# Patient Record
Sex: Female | Born: 1995 | Race: Black or African American | Hispanic: No | Marital: Single | Smoking: Never smoker
Health system: Southern US, Community
[De-identification: ages and names within clinical notes are randomized; demographics above are authoritative.]

## PROBLEM LIST (undated history)

## (undated) DIAGNOSIS — R519 Headache, unspecified: Secondary | ICD-10-CM

## (undated) DIAGNOSIS — K219 Gastro-esophageal reflux disease without esophagitis: Secondary | ICD-10-CM

## (undated) DIAGNOSIS — F419 Anxiety disorder, unspecified: Secondary | ICD-10-CM

## (undated) DIAGNOSIS — R51 Headache: Secondary | ICD-10-CM

## (undated) DIAGNOSIS — N809 Endometriosis, unspecified: Secondary | ICD-10-CM

## (undated) DIAGNOSIS — N83209 Unspecified ovarian cyst, unspecified side: Secondary | ICD-10-CM

## (undated) HISTORY — DX: Unspecified ovarian cyst, unspecified side: N83.209

## (undated) HISTORY — DX: Endometriosis, unspecified: N80.9

## (undated) HISTORY — PX: NO PAST SURGERIES: SHX2092

---

## 2015-03-28 ENCOUNTER — Encounter: Payer: Self-pay | Admitting: Obstetrics and Gynecology

## 2015-04-04 ENCOUNTER — Encounter: Payer: Self-pay | Admitting: Obstetrics and Gynecology

## 2015-05-03 ENCOUNTER — Encounter: Payer: Self-pay | Admitting: Physician Assistant

## 2015-05-03 ENCOUNTER — Ambulatory Visit (INDEPENDENT_AMBULATORY_CARE_PROVIDER_SITE_OTHER): Payer: BC Managed Care – PPO | Admitting: Physician Assistant

## 2015-05-03 VITALS — BP 100/72 | HR 72 | Temp 98.3°F | Resp 12 | Ht 61.5 in | Wt 117.0 lb

## 2015-05-03 DIAGNOSIS — N898 Other specified noninflammatory disorders of vagina: Secondary | ICD-10-CM | POA: Diagnosis not present

## 2015-05-03 DIAGNOSIS — M546 Pain in thoracic spine: Secondary | ICD-10-CM

## 2015-05-03 DIAGNOSIS — Z7251 High risk heterosexual behavior: Secondary | ICD-10-CM

## 2015-05-03 DIAGNOSIS — B379 Candidiasis, unspecified: Secondary | ICD-10-CM | POA: Diagnosis not present

## 2015-05-03 DIAGNOSIS — Z113 Encounter for screening for infections with a predominantly sexual mode of transmission: Secondary | ICD-10-CM

## 2015-05-03 DIAGNOSIS — Z0000 Encounter for general adult medical examination without abnormal findings: Secondary | ICD-10-CM | POA: Diagnosis not present

## 2015-05-03 DIAGNOSIS — R238 Other skin changes: Secondary | ICD-10-CM

## 2015-05-03 DIAGNOSIS — Z3009 Encounter for other general counseling and advice on contraception: Secondary | ICD-10-CM

## 2015-05-03 DIAGNOSIS — Z309 Encounter for contraceptive management, unspecified: Secondary | ICD-10-CM

## 2015-05-03 DIAGNOSIS — R233 Spontaneous ecchymoses: Secondary | ICD-10-CM

## 2015-05-03 LAB — POCT WET PREP (WET MOUNT)

## 2015-05-03 LAB — POCT URINE PREGNANCY: Preg Test, Ur: NEGATIVE

## 2015-05-03 MED ORDER — NORETHINDRONE ACET-ETHINYL EST 1-20 MG-MCG PO TABS: 1.0000 | ORAL_TABLET | Freq: Every day | ORAL | Status: DC

## 2015-05-03 MED ORDER — FLUCONAZOLE 150 MG PO TABS: 150.0000 mg | ORAL_TABLET | Freq: Once | ORAL | Status: DC

## 2015-05-03 NOTE — Progress Notes (Signed)
Patient ID: Kelly Lane, female   DOB: 26-Mar-1996, 19 y.o.   MRN: [id number]       Patient: Kelly Lane Female    DOB: Jan 16, 1996   19 y.o.   MRN: [id number] Visit Date: 05/03/2015  Today's Provider: Mar Daring, PA-C   Chief Complaint  Patient presents with  . Establish Care  . Vaginal Discharge   Subjective:    HPI  Patient is here to get established. She use to have PCP in [place]. Patient is having issues with vaginal discharge and vaginal pain, for a while she states. This happens right before she starts her menstrual cycle and just after. Discharge is described as brownish color at times as well as clear also. She has not been sexually active until just recently, December 9th. She did use condoms at that time. Her last menstrual cycle ended 4 days ago on December 25th roughly. She has never had a pap smear because her other PCP told her it was not necessary since she was not sexually active until she is 18 years old.  She is also interested in possibly seeing a specialist to be considered for breast reduction. She does state that she has chronic back pain in her mid back and sometimes low back. She feels that this is most likely due to her being large chested and having a very small frame.    Not on File Previous Medications   MULTIPLE VITAMIN (MULTIVITAMIN) CAPSULE    Take 1 capsule by mouth daily.    Review of Systems  Constitutional: Negative.   HENT: Positive for sneezing and sore throat.   Eyes: Negative.   Respiratory: Negative.   Cardiovascular: Negative.   Gastrointestinal: Negative.   Endocrine: Negative.   Genitourinary: Positive for vaginal discharge and vaginal pain.  Musculoskeletal: Negative.   Skin: Negative.   Allergic/Immunologic: Negative.   Neurological: Negative.   Hematological: Negative.   Psychiatric/Behavioral: Negative.     Social History  Substance Use Topics  . Smoking status: Never Smoker   . Smokeless tobacco: Never Used  .  Alcohol Use: No   Objective:   BP 100/72 mmHg  Pulse 72  Temp(Src) 98.3 F (36.8 C)  Resp 12  Ht 5' 1.5" (1.562 m)  Wt 117 lb (53.071 kg)  BMI 21.75 kg/m2  LMP 04/28/2015  Physical Exam  Constitutional: She is oriented to person, place, and time. She appears well-developed and well-nourished. No distress.  HENT:  Head: Normocephalic and atraumatic.  Right Ear: Hearing, tympanic membrane, external ear and ear canal normal.  Left Ear: Hearing, tympanic membrane, external ear and ear canal normal.  Nose: Nose normal.  Mouth/Throat: Uvula is midline, oropharynx is clear and moist and mucous membranes are normal. No oropharyngeal exudate.  Eyes: Conjunctivae and EOM are normal. Pupils are equal, round, and reactive to light. Right eye exhibits no discharge. Left eye exhibits no discharge. No scleral icterus.  Neck: Normal range of motion. Neck supple. No JVD present. Carotid bruit is not present. No tracheal deviation present. No thyromegaly present.  Cardiovascular: Normal rate, regular rhythm, normal heart sounds and intact distal pulses.  Exam reveals no gallop and no friction rub.   No murmur heard. Pulmonary/Chest: Effort normal and breath sounds normal. No respiratory distress. She has no wheezes. She has no rales. She exhibits no tenderness.  Abdominal: Soft. Bowel sounds are normal. She exhibits no distension and no mass. There is no tenderness. There is no rebound and no guarding. Hernia confirmed negative in  the right inguinal area and confirmed negative in the left inguinal area.  Genitourinary: Rectum normal and uterus normal. No breast swelling, tenderness, discharge or bleeding. Pelvic exam was performed with patient supine. There is no rash, tenderness, lesion or injury on the right labia. There is no rash, tenderness, lesion or injury on the left labia. Cervix exhibits no motion tenderness, no discharge and no friability. Right adnexum displays no mass, no tenderness and no  fullness. Left adnexum displays no mass, no tenderness and no fullness. No erythema, tenderness or bleeding in the vagina. No signs of injury around the vagina. Vaginal discharge (whitish-brown) found.  Musculoskeletal: Normal range of motion. She exhibits no edema or tenderness.  Lymphadenopathy:    She has no cervical adenopathy.       Right: No inguinal adenopathy present.       Left: No inguinal adenopathy present.  Neurological: She is alert and oriented to person, place, and time. She has normal reflexes. No cranial nerve deficit. Coordination normal.  Skin: Skin is warm and dry. No rash noted. She is not diaphoretic.  Psychiatric: She has a normal mood and affect. Her behavior is normal. Judgment and thought content normal.  Vitals reviewed.       Assessment & Plan:     1. Annual physical exam Exam was within normal limits with the exception of the vaginal discharge. Will treat as below. She is to call the office if she has any acute issues, questions or concerns. If she does not I will see her back in one year for her repeat annual physical exam. At that time she will be due for her Pap smear.  2. Encounter for general counseling on prescription of oral contraceptives We discussed her menstrual cycle and the cramping that is most likely PMS. I did discuss with her options for contraception including oral contraceptives, intrauterine devices, implantable, and injectables. I discussed side effects and benefits for each. I do feel that she would most benefit from oral contraception to better regulate her menstrual cycle as well as hopefully reduce menstrual cramping. It will also be beneficial since she is now sexually active. We did discuss that oral contraceptives do not prevent the spread of sexual transmitted diseases and that she should still continue to use other methods such as condoms to prevent transmission. We will start with Loestrin as below. She is to call the office if she has  any breakthrough bleeding on this medication and we will increase the estrogen dose. If she does not we will continue this and I'll refill as necessary. She is to call the office if she has any questions or concerns. If not I will see her back in one year for her repeat annual physical exam. - norethindrone-ethinyl estradiol (MICROGESTIN,JUNEL,LOESTRIN) 1-20 MG-MCG tablet; Take 1 tablet by mouth daily.  Dispense: 1 Package; Refill: 3  3. Encounter for screening examination for sexually transmitted disease Due to her recent sexual encounter she would like to be tested for STDs. - POCT Wet Prep ([place]) - GC/Chlamydia Probe Amp  4. Vaginal discharge Wet prep was positive for yeast. I will treat with Diflucan as below. She is to call the office if she develops similar symptoms or if symptoms do not improve or worsen. - POCT Wet Prep [employer])  5. Bilateral thoracic back pain She feels her back pain is most likely secondary to being large chested. She would like to be considered for breast reduction. I will refer her to plastic  surgery for evaluation and consultation on this. - Ambulatory referral to Plastic Surgery  6. Bruises easily She states that her hands and arms bruise easily and she feels that her feet are always cold. I will do a complete blood count to check for anemia. I will follow-up with her pending these results. If labs are stable I will see her back in one year for her repeat annual physical exam. - CBC with Differential  7. Unprotected sexual intercourse She states that they did use a condom but she still feels that she would like to be tested to make sure she is not pregnant. She was not sure if the condom state on the whole time nor is she sure if the condom was secure. Urine pregnancy in the office today was negative. - POCT urine pregnancy  8. Yeast infection Wet prep for vaginal discharge did reveal yeast. I will treat with Diflucan as below. She is to call the office  if symptoms fail to improve or persist. - fluconazole (DIFLUCAN) 150 MG tablet; Take 1 tablet (150 mg total) by mouth once. May take 2nd pill if needed in 24-48 hours following the 1st pill  Dispense: 2 tablet; Refill: 0       Mar Daring, PA-C  [facility]

## 2015-05-04 LAB — CBC WITH DIFFERENTIAL/PLATELET
Basophils Absolute: 0 10*3/uL (ref 0.0–0.2)
Basos: 1 %
EOS (ABSOLUTE): 0 10*3/uL (ref 0.0–0.4)
Eos: 1 %
Hematocrit: 38.5 % (ref 34.0–46.6)
Hemoglobin: 13.4 g/dL (ref 11.1–15.9)
Immature Grans (Abs): 0 10*3/uL (ref 0.0–0.1)
Immature Granulocytes: 0 %
Lymphocytes Absolute: 1.4 10*3/uL (ref 0.7–3.1)
Lymphs: 34 %
MCH: 33.1 pg — ABNORMAL HIGH (ref 26.6–33.0)
MCHC: 34.8 g/dL (ref 31.5–35.7)
MCV: 95 fL (ref 79–97)
Monocytes Absolute: 0.4 10*3/uL (ref 0.1–0.9)
Monocytes: 9 %
Neutrophils Absolute: 2.3 10*3/uL (ref 1.4–7.0)
Neutrophils: 55 %
Platelets: 257 10*3/uL (ref 150–379)
RBC: 4.05 x10E6/uL (ref 3.77–5.28)
RDW: 12.7 % (ref 12.3–15.4)
WBC: 4.2 10*3/uL (ref 3.4–10.8)

## 2015-05-05 LAB — GC/CHLAMYDIA PROBE AMP
Chlamydia trachomatis, NAA: NEGATIVE
Neisseria gonorrhoeae by PCR: NEGATIVE

## 2015-05-05 LAB — PLEASE NOTE

## 2015-05-08 ENCOUNTER — Telehealth: Payer: Self-pay

## 2015-05-08 NOTE — Telephone Encounter (Signed)
LMTCB  Thanks,  -Haddon Fyfe 

## 2015-05-08 NOTE — Telephone Encounter (Signed)
-----   Message from Mar Daring, PA-C sent at 05/08/2015  9:20 AM EST ----- STD testing was negative.

## 2015-05-08 NOTE — Telephone Encounter (Addendum)
Pt returning call.  [id number]

## 2015-05-08 NOTE — Telephone Encounter (Signed)
Patient advised as directed below. Patient wants to ask Tawanna Sat if is ok gor her to be spotting early with the contraceptive that she is currently taking. Per patient doesn't want the dose to be increase. Just wants to know if is ok for that to happen.( To be spotting early?)  Please Advise,  Thanks,  -Kelly Lane

## 2015-05-08 NOTE — Telephone Encounter (Signed)
LMTCB  Thanks,  -Mir Fullilove 

## 2015-05-08 NOTE — Telephone Encounter (Signed)
Yes that is fine at this time. It may take her body 2-3 months to adjust to the current dose. After 2-3 months if she is still having occasional spotting it may be necessary to increase the estrogen dose at that time.

## 2015-05-09 NOTE — Telephone Encounter (Signed)
Pt called back, has questions regarding her birth control.  Please call her back,  Thnaks,teri

## 2015-05-10 NOTE — Telephone Encounter (Signed)
LMTCB  Thanks,  -Scottlynn Lindell 

## 2015-05-10 NOTE — Telephone Encounter (Signed)
Pt returned Kelly Lane's call. Thanks TNP °

## 2015-05-11 NOTE — Telephone Encounter (Signed)
LMTCB  Thanks,  -Joseline 

## 2015-05-15 ENCOUNTER — Telehealth: Payer: Self-pay

## 2015-05-15 DIAGNOSIS — Z3042 Encounter for surveillance of injectable contraceptive: Secondary | ICD-10-CM

## 2015-05-15 NOTE — Telephone Encounter (Signed)
Patient called that doesn't want to take the oral contraception anymore doesn't like the bleeding issue. She said she has until the 8 th of this month to finished the package. Patient wants to stopped the oral contraception then and wants to try the injection. How long does she needs to wait after finishing the oral contraception before starting the injection? Patient was also asking that after stopping the oral contraception when will the bloody discharge be gone?  Please Advise,  Thanks,  -Grissel Tyrell

## 2015-05-15 NOTE — Telephone Encounter (Signed)
Patient advised as directed below on the previous messages.  Thanks,  -Joseline

## 2015-05-16 ENCOUNTER — Other Ambulatory Visit: Payer: Self-pay | Admitting: Physician Assistant

## 2015-05-16 ENCOUNTER — Telehealth: Payer: Self-pay | Admitting: Physician Assistant

## 2015-05-16 MED ORDER — MEDROXYPROGESTERONE ACETATE 150 MG/ML IM SUSP: 150.0000 mg | Freq: Once | INTRAMUSCULAR | Status: DC

## 2015-05-16 NOTE — Telephone Encounter (Signed)
She can switch immediately from one to the other.  This reduces risk of pregnancy. No gaps in between.  She may still notice some spotting once starting depo as well.  This normally only last for the first few weeks to one month. She may schedule an appt for depo provera at any time before she finishes her pack. If she would like I can send in Rx for depo and she can bring with her or we can give her one from here if we have it.

## 2015-05-16 NOTE — Telephone Encounter (Signed)
Patient is aware 

## 2015-05-16 NOTE — Telephone Encounter (Signed)
Please notify patient Rx was sent to [facility] [address] and to make sure she brings this when she comes on 05/23/15. Thanks.

## 2015-05-16 NOTE — Telephone Encounter (Signed)
Spoke with patient over the phone. She does not wish to use any birth control at this time. She may discontinue her oral contraceptive either now or when she would be going to the sugar pill portion of the pack so that her menstrual cycle will be more regular. She is to call the office if she changes her mind and would like to go back on the oral contraceptives at any time.

## 2015-05-16 NOTE — Telephone Encounter (Signed)
Patient advised as directed below. Patient scheduled for depo shot on 05/23/2015. Pharmacy- [place].

## 2015-05-16 NOTE — Telephone Encounter (Signed)
Pt called saying she wants to go off her birth control pilss.  She has an appt next Wednesday to get the depo shot but sh does not want to .  She does not want to take anything.  She wants to talk to you about stopping her pills.  Her call back is [phone]  Thanks Con Memos

## 2015-05-23 ENCOUNTER — Ambulatory Visit: Payer: Self-pay | Admitting: Physician Assistant

## 2015-05-31 ENCOUNTER — Encounter: Payer: Self-pay | Admitting: Obstetrics and Gynecology

## 2015-06-11 ENCOUNTER — Telehealth: Payer: Self-pay

## 2015-06-11 NOTE — Telephone Encounter (Signed)
We received a call center report on patient saying that she is having pelvic pains and she is 2 weeks late on her period. L/M for patient to give [place] a call back to schedule an appt.

## 2015-06-12 ENCOUNTER — Encounter: Payer: Self-pay | Admitting: Physician Assistant

## 2015-06-12 ENCOUNTER — Ambulatory Visit
Admission: RE | Admit: 2015-06-12 | Discharge: 2015-06-12 | Disposition: A | Discharge status: Home / Self Care | Payer: BC Managed Care – PPO | Source: Ambulatory Visit | Attending: Physician Assistant | Admitting: Physician Assistant

## 2015-06-12 ENCOUNTER — Ambulatory Visit (INDEPENDENT_AMBULATORY_CARE_PROVIDER_SITE_OTHER): Payer: BC Managed Care – PPO | Admitting: Physician Assistant

## 2015-06-12 ENCOUNTER — Telehealth: Payer: Self-pay

## 2015-06-12 VITALS — BP 120/70 | HR 70 | Temp 98.4°F | Resp 16 | Wt 116.8 lb

## 2015-06-12 DIAGNOSIS — R1031 Right lower quadrant pain: Secondary | ICD-10-CM | POA: Insufficient documentation

## 2015-06-12 DIAGNOSIS — Z8489 Family history of other specified conditions: Secondary | ICD-10-CM

## 2015-06-12 DIAGNOSIS — Z87898 Personal history of other specified conditions: Secondary | ICD-10-CM

## 2015-06-12 DIAGNOSIS — Z842 Family history of other diseases of the genitourinary system: Secondary | ICD-10-CM

## 2015-06-12 DIAGNOSIS — N926 Irregular menstruation, unspecified: Secondary | ICD-10-CM

## 2015-06-12 DIAGNOSIS — K5900 Constipation, unspecified: Secondary | ICD-10-CM | POA: Diagnosis not present

## 2015-06-12 LAB — POCT URINE PREGNANCY: Preg Test, Ur: NEGATIVE

## 2015-06-12 NOTE — Telephone Encounter (Signed)
-----   Message from Mar Daring, [place] sent at 06/12/2015  1:54 PM EST ----- Moderate stool is noted in the colon including a moderate amount located in the pelvic region.  I do feel this is what is causing your abdominal pain, but still recommend following through with the pelvic US as well.  They should be contacting you about that appointment soon. In the meantime I do recommend adding miralax (available OTC) for constipation. Start with one capful daily.  If still no BM increase to 1 capful twice daily. It is ok to continue miralax daily. I recommend use to keep stools soft and semi-loose but not watery.

## 2015-06-12 NOTE — Telephone Encounter (Signed)
LMTCB  Thanks,  -Joseline 

## 2015-06-12 NOTE — Progress Notes (Signed)
Patient: Kelly Lane Female    DOB: [birth year]   20 y.o.   MRN: [id number] Visit Date: 06/12/2015  Today's Provider: Mar Daring, PA-C   Chief Complaint  Patient presents with  . Menstrual Problem   Subjective:    HPI Kelly Lane is here concern about pelvic pain and is 2.5 weeks late on her menstrual cycle. On 05/16/15 patient called that didn't want to be on any birth control medication at that time. She was advised to finish what she had left or to just to discontinue her oral contraceptive. She has stomach discomfort, cramps on the right side of lower abdomen, nauseas and a little vaginal discharge,clear to white. Stools are irregular and hard. She also states they are small in size. She denies any blood in the stool or rectal bleeding with bowel movements. She is currently sexually active. She does state that she took a pregnancy test at home which was negative as well.     No Known Allergies Previous Medications   FLUCONAZOLE (DIFLUCAN) 150 MG TABLET    Take 1 tablet (150 mg total) by mouth once. May take 2nd pill if needed in 24-48 hours following the 1st pill   MULTIPLE VITAMIN (MULTIVITAMIN) CAPSULE    Take 1 capsule by mouth daily.    Review of Systems  Constitutional: Negative for fever, chills and fatigue.  HENT: Negative.   Respiratory: Negative for cough, chest tightness, shortness of breath and wheezing.   Cardiovascular: Negative for chest pain.  Gastrointestinal: Positive for nausea, abdominal pain and abdominal distention. Negative for vomiting, diarrhea, constipation, blood in stool and anal bleeding.  Genitourinary: Positive for vaginal discharge and pelvic pain. Negative for dysuria, urgency, frequency, hematuria, flank pain, vaginal bleeding, genital sores, vaginal pain and menstrual problem.  Musculoskeletal: Negative for back pain.    Social History  Substance Use Topics  . Smoking status: Never Smoker   . Smokeless tobacco: Never Used  .  Alcohol Use: No   Objective:   BP 120/70 mmHg  Pulse 70  Temp(Src) 98.4 F (36.9 C) (Oral)  Resp 16  Wt 116 lb 12.8 oz (52.98 kg)  LMP 04/22/2015  Physical Exam  Constitutional: She is oriented to person, place, and time. She appears well-developed and well-nourished. No distress.  Cardiovascular: Normal rate, regular rhythm and normal heart sounds.  Exam reveals no gallop and no friction rub.   No murmur heard. Pulmonary/Chest: Effort normal and breath sounds normal. No respiratory distress. She has no wheezes. She has no rales.  Abdominal: Soft. Normal appearance and bowel sounds are normal. She exhibits no shifting dullness, no distension, no ascites, no pulsatile midline mass and no mass. There is no hepatosplenomegaly. There is no tenderness. There is no rebound, no guarding and no CVA tenderness.  Neurological: She is alert and oriented to person, place, and time.  Skin: Skin is warm and dry. She is not diaphoretic.        Assessment & Plan:     1. Missed menses Urine pregnancy today in the office was negative. She does have positive family history of uterine fibroids in her mother. We will get a pelvic ultrasound and transvaginal ultrasound to further evaluate for this as well as possible ovarian cyst to see if this may be a possible cause for her missed menstruation as well as right lower quadrant pain. I will follow-up with her pending the results of these tests. - POCT urine pregnancy - US Pelvis Complete;  Future - US Transvaginal Non-OB; Future  2. RLQ abdominal pain I will obtain an abdominal x-ray to evaluate for constipation. I do feel this may be more likely the cause of her abdominal pain. I will follow-up with her pending these results. - DG Abd 2 Views; Future - US Pelvis Complete; Future - US Transvaginal Non-OB; Future  3. Family history of uterine fibroid See above medical treatment plan for #1. - US Pelvis Complete; Future - US Transvaginal Non-OB;  Future       Mar Daring, PA-C  [facility]

## 2015-06-12 NOTE — Telephone Encounter (Signed)
Closing Encounter. Patient has an appointment today to see Fenton Malling.  Thanks.  Kasandra Knudsen

## 2015-06-13 NOTE — Telephone Encounter (Signed)
Patient advised as directed below.  Thanks,  -Joseline 

## 2015-06-13 NOTE — Patient Instructions (Signed)

## 2015-06-15 ENCOUNTER — Telehealth: Payer: Self-pay

## 2015-06-15 ENCOUNTER — Other Ambulatory Visit: Payer: Self-pay | Admitting: Physician Assistant

## 2015-06-15 ENCOUNTER — Ambulatory Visit
Admission: RE | Admit: 2015-06-15 | Discharge: 2015-06-15 | Disposition: A | Discharge status: Home / Self Care | Payer: BC Managed Care – PPO | Source: Ambulatory Visit | Attending: Physician Assistant | Admitting: Physician Assistant

## 2015-06-15 DIAGNOSIS — R1031 Right lower quadrant pain: Secondary | ICD-10-CM | POA: Diagnosis present

## 2015-06-15 DIAGNOSIS — R938 Abnormal findings on diagnostic imaging of other specified body structures: Secondary | ICD-10-CM | POA: Insufficient documentation

## 2015-06-15 DIAGNOSIS — R9389 Abnormal findings on diagnostic imaging of other specified body structures: Secondary | ICD-10-CM

## 2015-06-15 DIAGNOSIS — N926 Irregular menstruation, unspecified: Secondary | ICD-10-CM

## 2015-06-15 DIAGNOSIS — N83291 Other ovarian cyst, right side: Secondary | ICD-10-CM | POA: Diagnosis not present

## 2015-06-15 DIAGNOSIS — N83299 Other ovarian cyst, unspecified side: Secondary | ICD-10-CM

## 2015-06-15 DIAGNOSIS — R102 Pelvic and perineal pain: Secondary | ICD-10-CM

## 2015-06-15 DIAGNOSIS — Z842 Family history of other diseases of the genitourinary system: Secondary | ICD-10-CM | POA: Insufficient documentation

## 2015-06-15 DIAGNOSIS — Z8489 Family history of other specified conditions: Secondary | ICD-10-CM

## 2015-06-15 DIAGNOSIS — Z87898 Personal history of other specified conditions: Secondary | ICD-10-CM

## 2015-06-15 NOTE — Telephone Encounter (Signed)
Olivia Mackie from the [facility] radiology department called with [place] results. Edommetrial thickening 19 mm. Endometrial thickening considered abnormal. Consider follow-up by [place] 6-8 weeks. During the week immediately following menses.  Complex Ovarian cyst 2.8 by 2.9 by 2.0 cm. Short interval Follow-up US in 6-12 weeks recommended. Pregnancy test is also suggested to exclude ectopic pregnancy.  Thanks,  -Joseline

## 2015-06-15 NOTE — Telephone Encounter (Signed)
Called and left VM for patient to call back.  Have ordered referral to GYN for further evaluation and have ordered beta HCG lab that can be done to make sure no ectopic pregnancy. Please inform of results as below if she calls back and I am not available.  I will be happy to answer any and all questions when she does call.  Thanks.

## 2015-06-15 NOTE — Telephone Encounter (Signed)
Asked currently closed note please keep open.

## 2015-06-16 LAB — BETA HCG QUANT (REF LAB): hCG Quant: <1 m[IU]/mL

## 2015-06-16 NOTE — Telephone Encounter (Signed)
Spoke with patient and answered all questions. Patient agrees with current treatment plan.

## 2015-06-18 ENCOUNTER — Telehealth: Payer: Self-pay | Admitting: Physician Assistant

## 2015-06-18 NOTE — Telephone Encounter (Signed)
Perfect

## 2015-06-18 NOTE — Telephone Encounter (Signed)
Pt is requesting a call back from [place].  Pt is wanting to discuss something that happened with her over the weekend.  [id number]

## 2015-06-18 NOTE — Telephone Encounter (Signed)
Spoke with patient and she only wanted Kelly Lane to know that She got her period last night.  Thanks,  -Peony Barner

## 2015-06-21 ENCOUNTER — Telehealth: Payer: Self-pay | Admitting: Physician Assistant

## 2015-06-21 NOTE — Telephone Encounter (Signed)
Pt called back about referral. I looked in the referral notes advised pt that [facility] should contact her to schedule appt and Judson Roch gave me the number to that office. I gave the office # [phone] to pt and advised that if she had any more questions to call us back. Thanks TNP

## 2015-06-21 NOTE — Telephone Encounter (Signed)
Pt called wanting to know who you were going to refer her to for GYN care.  She needs the name and their number.  Pt's call back is [phone]  Thanks Con Memos

## 2015-07-04 DIAGNOSIS — N83209 Unspecified ovarian cyst, unspecified side: Secondary | ICD-10-CM

## 2015-07-04 DIAGNOSIS — N809 Endometriosis, unspecified: Secondary | ICD-10-CM

## 2015-07-04 HISTORY — DX: Unspecified ovarian cyst, unspecified side: N83.209

## 2015-07-04 HISTORY — DX: Endometriosis, unspecified: N80.9

## 2015-07-10 ENCOUNTER — Other Ambulatory Visit: Payer: BC Managed Care – PPO

## 2015-07-10 ENCOUNTER — Encounter: Payer: Self-pay | Admitting: *Deleted

## 2015-07-10 NOTE — Patient Instructions (Signed)
  Your procedure is scheduled on: 07-17-15 (TUESDAY) Report to [place] To find out your arrival time please call [phone] between 1PM - 3PM on 07-16-15 [facility])  Remember: Instructions that are not followed completely may result in serious medical risk, up to and including death, or upon the discretion of your surgeon and anesthesiologist your surgery may need to be rescheduled.    _X___ 1. Do not eat food or drink liquids after midnight. No gum chewing or hard candies.     _X___ 2. No Alcohol for 24 hours before or after surgery.   ____ 3. Bring all medications with you on the day of surgery if instructed.    _X___ 4. Notify your doctor if there is any change in your medical condition     (cold, fever, infections).     Do not wear jewelry, make-up, hairpins, clips or nail polish.  Do not wear lotions, powders, or perfumes. You may wear deodorant.  Do not shave 48 hours prior to surgery. Men may shave face and neck.  Do not bring valuables to the hospital.    [facility] is not responsible for any belongings or valuables.               Contacts, dentures or bridgework may not be worn into surgery.  Leave your suitcase in the car. After surgery it may be brought to your room.  For patients admitted to the hospital, discharge time is determined by your treatment team.   Patients discharged the day of surgery will not be allowed to drive home.   Please read over the following fact sheets that you were given:      ____ Take these medicines the morning of surgery with A SIP OF WATER:    1. NONE  2.   3.   4.  5.  6.  ____ Fleet Enema (as directed)   _X___ Use CHG Soap as directed  ____ Use inhalers on the day of surgery  ____ Stop metformin 2 days prior to surgery    ____ Take 1/2 of usual insulin dose the night before surgery and none on the morning of surgery.   ____ Stop Coumadin/Plavix/aspirin-N/A  ____ Stop Anti-inflammatories-NO  NSAIDS OR ASPIRIN PRODUCTS-TYLENOL OK TO TAKE   ____ Stop supplements until after surgery.    ____ Bring C-Pap to the hospital.

## 2015-07-11 ENCOUNTER — Encounter: Payer: BC Managed Care – PPO | Admitting: Obstetrics and Gynecology

## 2015-07-11 ENCOUNTER — Encounter
Admission: RE | Admit: 2015-07-11 | Discharge: 2015-07-11 | Disposition: A | Discharge status: Home / Self Care | Payer: BC Managed Care – PPO | Source: Ambulatory Visit | Attending: Obstetrics & Gynecology | Admitting: Obstetrics & Gynecology

## 2015-07-11 DIAGNOSIS — Z01812 Encounter for preprocedural laboratory examination: Secondary | ICD-10-CM | POA: Diagnosis present

## 2015-07-11 LAB — CBC
HCT: 39.3 % (ref 35.0–47.0)
Hemoglobin: 13.6 g/dL (ref 12.0–16.0)
MCH: 32.7 pg (ref 26.0–34.0)
MCHC: 34.6 g/dL (ref 32.0–36.0)
MCV: 94.5 fL (ref 80.0–100.0)
Platelets: 177 10*3/uL (ref 150–440)
RBC: 4.16 MIL/uL (ref 3.80–5.20)
RDW: 12.9 % (ref 11.5–14.5)
WBC: 3.7 10*3/uL (ref 3.6–11.0)

## 2015-07-11 LAB — TYPE AND SCREEN
ABO/RH(D): AB NEG
Antibody Screen: NEGATIVE

## 2015-07-12 LAB — ABO/RH: ABO/RH(D): AB NEG

## 2015-07-17 ENCOUNTER — Ambulatory Visit
Admission: RE | Admit: 2015-07-17 | Discharge: 2015-07-17 | Disposition: A | Discharge status: Home / Self Care | Payer: BC Managed Care – PPO | Source: Ambulatory Visit | Attending: Obstetrics & Gynecology | Admitting: Obstetrics & Gynecology

## 2015-07-17 ENCOUNTER — Ambulatory Visit: Payer: BC Managed Care – PPO | Admitting: Anesthesiology

## 2015-07-17 ENCOUNTER — Encounter
Admission: RE | Disposition: A | Discharge status: Home / Self Care | Payer: Self-pay | Source: Ambulatory Visit | Attending: Obstetrics & Gynecology

## 2015-07-17 ENCOUNTER — Encounter: Payer: Self-pay | Admitting: *Deleted

## 2015-07-17 DIAGNOSIS — N83201 Unspecified ovarian cyst, right side: Secondary | ICD-10-CM | POA: Diagnosis not present

## 2015-07-17 DIAGNOSIS — N809 Endometriosis, unspecified: Secondary | ICD-10-CM | POA: Diagnosis not present

## 2015-07-17 DIAGNOSIS — N926 Irregular menstruation, unspecified: Secondary | ICD-10-CM | POA: Insufficient documentation

## 2015-07-17 DIAGNOSIS — R1031 Right lower quadrant pain: Secondary | ICD-10-CM | POA: Diagnosis present

## 2015-07-17 DIAGNOSIS — K668 Other specified disorders of peritoneum: Secondary | ICD-10-CM | POA: Diagnosis not present

## 2015-07-17 HISTORY — DX: Gastro-esophageal reflux disease without esophagitis: K21.9

## 2015-07-17 HISTORY — PX: LAPAROSCOPY: SHX197

## 2015-07-17 HISTORY — PX: LAPAROSCOPIC OVARIAN CYSTECTOMY: SHX6248

## 2015-07-17 HISTORY — DX: Headache: R51

## 2015-07-17 HISTORY — DX: Headache, unspecified: R51.9

## 2015-07-17 LAB — POCT PREGNANCY, URINE: Preg Test, Ur: NEGATIVE

## 2015-07-17 SURGERY — LAPAROSCOPY OPERATIVE
Anesthesia: General | Laterality: Right | Wound class: Clean Contaminated

## 2015-07-17 MED ORDER — OXYCODONE HCL 5 MG PO TABS
5.0000 mg | ORAL_TABLET | Freq: Once | ORAL | Status: AC | PRN
  Administered 2015-07-17: 5 mg via ORAL

## 2015-07-17 MED ORDER — LACTATED RINGERS IV SOLN
INTRAVENOUS | Status: DC
  Administered 2015-07-17 (×2): via INTRAVENOUS

## 2015-07-17 MED ORDER — ROCURONIUM BROMIDE 100 MG/10ML IV SOLN
INTRAVENOUS | Status: DC | PRN
  Administered 2015-07-17: 10 mg via INTRAVENOUS
  Administered 2015-07-17: 30 mg via INTRAVENOUS

## 2015-07-17 MED ORDER — KETOROLAC TROMETHAMINE 30 MG/ML IJ SOLN
INTRAMUSCULAR | Status: DC | PRN
  Administered 2015-07-17: 30 mg via INTRAVENOUS

## 2015-07-17 MED ORDER — FAMOTIDINE 20 MG PO TABS
ORAL_TABLET | ORAL | Status: AC
  Filled 2015-07-17: qty 1

## 2015-07-17 MED ORDER — OXYCODONE-ACETAMINOPHEN 5-325 MG PO TABS: 1.0000 | ORAL_TABLET | ORAL | Status: DC | PRN

## 2015-07-17 MED ORDER — FAMOTIDINE 20 MG PO TABS
20.0000 mg | ORAL_TABLET | Freq: Once | ORAL | Status: AC
  Administered 2015-07-17: 20 mg via ORAL

## 2015-07-17 MED ORDER — MIDAZOLAM HCL 5 MG/5ML IJ SOLN
INTRAMUSCULAR | Status: DC | PRN
  Administered 2015-07-17: 2 mg via INTRAVENOUS

## 2015-07-17 MED ORDER — BUPIVACAINE HCL (PF) 0.5 % IJ SOLN
INTRAMUSCULAR | Status: AC
  Filled 2015-07-17: qty 30

## 2015-07-17 MED ORDER — SUGAMMADEX SODIUM 200 MG/2ML IV SOLN
INTRAVENOUS | Status: DC | PRN
  Administered 2015-07-17: 100 mg via INTRAVENOUS

## 2015-07-17 MED ORDER — LIDOCAINE HCL (CARDIAC) 20 MG/ML IV SOLN
INTRAVENOUS | Status: DC | PRN
  Administered 2015-07-17: 60 mg via INTRAVENOUS

## 2015-07-17 MED ORDER — FENTANYL CITRATE (PF) 100 MCG/2ML IJ SOLN
INTRAMUSCULAR | Status: DC | PRN
  Administered 2015-07-17 (×2): 50 ug via INTRAVENOUS
  Administered 2015-07-17: 100 ug via INTRAVENOUS

## 2015-07-17 MED ORDER — BUPIVACAINE HCL (PF) 0.5 % IJ SOLN
INTRAMUSCULAR | Status: DC | PRN
  Administered 2015-07-17: 30 mL

## 2015-07-17 MED ORDER — LIDOCAINE HCL (PF) 1 % IJ SOLN
INTRAMUSCULAR | Status: AC
  Filled 2015-07-17: qty 2

## 2015-07-17 MED ORDER — FENTANYL CITRATE (PF) 100 MCG/2ML IJ SOLN: 25.0000 ug | INTRAMUSCULAR | Status: DC | PRN

## 2015-07-17 MED ORDER — OXYCODONE HCL 5 MG PO TABS
ORAL_TABLET | ORAL | Status: AC
  Filled 2015-07-17: qty 1

## 2015-07-17 MED ORDER — PROPOFOL 10 MG/ML IV BOLUS
INTRAVENOUS | Status: DC | PRN
  Administered 2015-07-17: 150 mg via INTRAVENOUS

## 2015-07-17 MED ORDER — DEXAMETHASONE SODIUM PHOSPHATE 10 MG/ML IJ SOLN
INTRAMUSCULAR | Status: DC | PRN
  Administered 2015-07-17: 5 mg via INTRAVENOUS

## 2015-07-17 MED ORDER — OXYCODONE HCL 5 MG/5ML PO SOLN: 5.0000 mg | Freq: Once | ORAL | Status: AC | PRN

## 2015-07-17 MED ORDER — ONDANSETRON HCL 4 MG/2ML IJ SOLN
INTRAMUSCULAR | Status: DC | PRN
  Administered 2015-07-17: 4 mg via INTRAVENOUS

## 2015-07-17 SURGICAL SUPPLY — 35 items
BLADE SURG SZ11 CARB STEEL (BLADE) ×3 IMPLANT
CANISTER SUCT 1200ML W/VALVE (MISCELLANEOUS) ×3 IMPLANT
CATH ROBINSON RED A/P 16FR (CATHETERS) ×3 IMPLANT
CHLORAPREP W/TINT 26ML (MISCELLANEOUS) ×3 IMPLANT
DRESSING TELFA 4X3 1S ST N-ADH (GAUZE/BANDAGES/DRESSINGS) IMPLANT
ENDOPOUCH RETRIEVER 10 (MISCELLANEOUS) IMPLANT
GAUZE SPONGE NON-WVN 2X2 STRL (MISCELLANEOUS) IMPLANT
GLOVE BIO SURGEON STRL SZ8 (GLOVE) ×3 IMPLANT
GLOVE INDICATOR 8.0 STRL GRN (GLOVE) ×3 IMPLANT
GOWN STRL REUS W/ TWL LRG LVL3 (GOWN DISPOSABLE) ×2 IMPLANT
GOWN STRL REUS W/ TWL XL LVL3 (GOWN DISPOSABLE) ×2 IMPLANT
GOWN STRL REUS W/TWL LRG LVL3 (GOWN DISPOSABLE) ×1
GOWN STRL REUS W/TWL XL LVL3 (GOWN DISPOSABLE) ×1
IRRIGATION STRYKERFLOW (MISCELLANEOUS) ×2 IMPLANT
IRRIGATOR STRYKERFLOW (MISCELLANEOUS) ×3
IV LACTATED RINGERS 1000ML (IV SOLUTION) IMPLANT
KIT PINK PAD W/HEAD ARE REST (MISCELLANEOUS) ×3
KIT PINK PAD W/HEAD ARM REST (MISCELLANEOUS) ×2 IMPLANT
LABEL OR SOLS (LABEL) IMPLANT
LIQUID BAND (GAUZE/BANDAGES/DRESSINGS) ×3 IMPLANT
NEEDLE VERESS 14GA 120MM (NEEDLE) ×3 IMPLANT
NS IRRIG 500ML POUR BTL (IV SOLUTION) ×3 IMPLANT
PACK GYN LAPAROSCOPIC (MISCELLANEOUS) ×3 IMPLANT
PAD PREP 24X41 OB/GYN DISP (PERSONAL CARE ITEMS) ×3 IMPLANT
SCISSORS METZENBAUM CVD 33 (INSTRUMENTS) IMPLANT
SHEARS HARMONIC ACE PLUS 36CM (ENDOMECHANICALS) IMPLANT
SLEEVE ENDOPATH XCEL 5M (ENDOMECHANICALS) IMPLANT
SPONGE VERSALON 2X2 STRL (MISCELLANEOUS)
STRAP SAFETY BODY (MISCELLANEOUS) ×3 IMPLANT
SUT VIC AB 2-0 UR6 27 (SUTURE) IMPLANT
SUT VIC AB 4-0 PS2 18 (SUTURE) IMPLANT
SYRINGE 10CC LL (SYRINGE) ×3 IMPLANT
TROCAR ENDO BLADELESS 11MM (ENDOMECHANICALS) IMPLANT
TROCAR XCEL NON-BLD 5MMX100MML (ENDOMECHANICALS) ×3 IMPLANT
TUBING INSUFFLATOR HI FLOW (MISCELLANEOUS) ×3 IMPLANT

## 2015-07-17 NOTE — H&P (Signed)
[place] [address] [place] [id number] [place] [address] Phone: [phone]  July 17, 2015  Patient: Kelly Lane  Date of Birth: Jul 30, 1995  Date of Visit: 07/17/2015    To Whom It May Concern:  Kimi Mochizuki was [facility] and [facility]/2015. Jordis Ezzo  may return to work on 07/22/15 without restrictions.  Sincerely,  Barnett Applebaum, MD [facility]

## 2015-07-17 NOTE — Discharge Instructions (Signed)
General Gynecological Post-Operative Instructions °You may expect to feel dizzy, weak, and drowsy for as long as 24 hours after receiving the medicine that made you sleep (anesthetic).  °Do not drive a car, ride a bicycle, participate in physical activities, or take public transportation until you are done taking narcotic pain medicines or as directed by your doctor.  °Do not drink alcohol or take tranquilizers.  °Do not take medicine that has not been prescribed by your doctor.  °Do not sign important papers or make important decisions while on narcotic pain medicines.  °Have a responsible person with you.  °CARE OF INCISION  °Keep incision clean and dry. °Take showers instead of baths until your doctor gives you permission to take baths.  °Avoid heavy lifting (more than 10 pounds/4.5 kilograms), pushing, or pulling.  °Avoid activities that may risk injury to your surgical site.  °No sexual intercourse or placement of anything in the vagina for 2 weeks or as instructed by your doctor. °If you have tubes coming from the wound site, check with your doctor regarding appropriate care of the tubes. °Only take prescription or over-the-counter medicines  for pain, discomfort, or fever as directed by your doctor. Do not take aspirin. It can make you bleed. Take medicines (antibiotics) that kill germs if they are prescribed for you.  °Call the office or go to the ER if:  °You feel sick to your stomach (nauseous) and you start to throw up (vomit).  °You have trouble eating or drinking.  °You have an oral temperature above 101.  °You have constipation that is not helped by adjusting diet or increasing fluid intake. Pain medicines are a common cause of constipation.  °You have any other concerns. °SEEK IMMEDIATE MEDICAL CARE IF:  °You have persistent dizziness.  °You have difficulty breathing or a congested sounding (croupy) cough.  °You have an oral temperature above 102.5, not controlled by medicine.  °There is increasing  pain or tenderness near or in the surgical site.  ° °AMBULATORY SURGERY  °DISCHARGE INSTRUCTIONS ° ° °1) The drugs that you were given will stay in your system until tomorrow so for the next 24 hours you should not: ° °A) Drive an automobile °B) Make any legal decisions °C) Drink any alcoholic beverage ° ° °2) You may resume regular meals tomorrow.  Today it is better to start with liquids and gradually work up to solid foods. ° °You may eat anything you prefer, but it is better to start with liquids, then soup and crackers, and gradually work up to solid foods. ° ° °3) Please notify your doctor immediately if you have any unusual bleeding, trouble breathing, redness and pain at the surgery site, drainage, fever, or pain not relieved by medication. ° ° ° °4) Additional Instructions: ° ° ° ° ° ° ° °Please contact your physician with any problems or Same Day Surgery at [phone], Monday through Friday 6 am to 4 pm, or [facility] number at [phone]. ° ° °

## 2015-07-17 NOTE — Transfer of Care (Signed)
Immediate Anesthesia Transfer of Care Note  Patient: Kelly Lane  Procedure(s) Performed: Procedure(s): LAPAROSCOPY OPERATIVE (N/A) EXCISION OF ENDOMETRIOSIS (Right)  Patient Location: PACU  Anesthesia Type:General  Level of Consciousness: sedated  Airway & Oxygen Therapy: Patient Spontanous Breathing and Patient connected to face mask oxygen  Post-op Assessment: Report given to RN and Post -op Vital signs reviewed and stable  Post vital signs: Reviewed and stable  Last Vitals:  Filed Vitals:   07/17/15 1451 07/17/15 1727  BP: 116/80 112/70  Pulse: 63 76  Temp: 36.9 C 36.2 C  Resp: 16 23    Complications: Patient re-intubated

## 2015-07-17 NOTE — H&P (Signed)
History and Physical Interval Note:  07/17/2015 4:08 PM  Kelly Lane  has presented today for surgery, with the diagnosis of ACUTE ABDOMINAL PAIN, OVARIAN CYST- RIGHT, ENDOMETRIOSIS  The various methods of treatment have been discussed with the patient and family. After consideration of risks, benefits and other options for treatment, the patient has consented to  Procedure(s): LAPAROSCOPY OPERATIVE (N/A) LAPAROSCOPIC OVARIAN CYSTECTOMY (Right) as a surgical intervention .  The patient's history has been reviewed, patient examined, no change in status, stable for surgery.  Pt has the following beta blocker history-  Not taking Beta Blocker.  I have reviewed the patient's chart and labs.  Questions were answered to the patient's satisfaction.       Hoyt Koch

## 2015-07-17 NOTE — Anesthesia Postprocedure Evaluation (Signed)
Anesthesia Post Note  Patient: Kelly Lane  Procedure(s) Performed: Procedure(s) (LRB): LAPAROSCOPY OPERATIVE (N/A) EXCISION OF ENDOMETRIOSIS (Right)  Patient location during evaluation: PACU Anesthesia Type: General Level of consciousness: awake and alert Pain management: pain level controlled Vital Signs Assessment: post-procedure vital signs reviewed and stable Respiratory status: spontaneous breathing, nonlabored ventilation, respiratory function stable and patient connected to nasal cannula oxygen Cardiovascular status: blood pressure returned to baseline and stable Postop Assessment: no signs of nausea or vomiting Anesthetic complications: no    Last Vitals:  Filed Vitals:   07/17/15 1808 07/17/15 1821  BP: 111/82 112/77  Pulse: 78 82  Temp: 36.4 C 36.3 C  Resp: 17 16    Last Pain:  Filed Vitals:   07/17/15 1824  PainSc: 4                  Precious Haws Piscitello

## 2015-07-17 NOTE — Op Note (Signed)
  Operative Note   07/17/2015  PRE-OP DIAGNOSIS: Right lower quadrant pain, Right Adnexal Cyst    POST-OP DIAGNOSIS: Right lower quadrant pain, Peritoneal inclusion cyst, Endometriosis   PROCEDURE: Procedure(s): LAPAROSCOPY OPERATIVE EXCISION OF ENDOMETRIOSIS   SURGEON: Barnett Applebaum, MD, FACOG  ANESTHESIA: Choice   ESTIMATED BLOOD LOSS: 10 mL  COMPLICATIONS: None  DISPOSITION: PACU - hemodynamically stable.  CONDITION: stable  FINDINGS: Laparoscopic survey of the abdomen revealed a grossly normal uterus, tubes, ovaries, liver edge, gallbladder edge and appendix, No intra-abdominal adhesions were noted. Right ovarian fossa has deep pocket with yellow fluid aspirated, and area of endometroisis here as well which is excised.  No other evidence of endometriosis elsewhere.  PROCEDURE IN DETAIL: The patient was taken to the OR where anesthesia was administed. The patient was positioned in supine position after foley catheter had been placed. The patient was then examined under anesthesia with the above noted findings. The patient was prepped and draped in the normal sterile fashion.   Attention was turned to the patient's abdomen where a 5 mm skin incision was made in the umbilical fold, after injection of local anesthesia. The Veress step needle was carefully introduced into the peritoneal cavity with placement confirmed using the hanging drop technique.  Pneumoperitoneum was obtained. The 5 mm port was then placed under direct visualization with the operative laparoscope.  Trendelenburg positioning.  Additional 5 mm trocar was then placed in the LLQ lateral to the inferior epigastric blood vessels under direct visualization with the laparoscope, as well as the suprapubic region.  Instrumentation to visualize complete pelvic anatomy performed.    Cyst fluid is aspirated.  Area of defect/brown change to peritoneum (c/w endometriosis) excised with harmonic scapel and sent to  pathology.Hemastasis confirmed,  Instruments and trocars removed, gas expelled, and skin closed with skin adhesive glue.  Instrument, needle, and sponge counts correct x2 at the conclusion of the case.  Pt goes to recovery room in stable condition.  Foley removed.

## 2015-07-17 NOTE — Anesthesia Preprocedure Evaluation (Addendum)
Anesthesia Evaluation  Patient identified by MRN, date of birth, ID band Patient awake    Reviewed: Allergy & Precautions, H&P , NPO status , Patient's Chart, lab work & pertinent test results  History of Anesthesia Complications Negative for: history of anesthetic complications  Airway Mallampati: II  TM Distance: >3 FB Neck ROM: full    Dental  (+) Poor Dentition   Pulmonary neg pulmonary ROS, neg shortness of breath,    Pulmonary exam normal breath sounds clear to auscultation       Cardiovascular Exercise Tolerance: Good (-) angina(-) Past MI and (-) DOE Normal cardiovascular exam Rhythm:regular Rate:Normal     Neuro/Psych  Headaches, negative psych ROS   GI/Hepatic Neg liver ROS, GERD  Controlled,  Endo/Other  negative endocrine ROS  Renal/GU negative Renal ROS  negative genitourinary   Musculoskeletal   Abdominal   Peds  Hematology negative hematology ROS (+)   Anesthesia Other Findings Past Medical History:   GERD (gastroesophageal reflux disease)                         Comment:NO MEDS   Headache                                                    Past Surgical History:   NO PAST SURGERIES                                            BMI    Body Mass Index   20.90 kg/m 2      Reproductive/Obstetrics negative OB ROS                            Anesthesia Physical Anesthesia Plan  ASA: III  Anesthesia Plan: General ETT   Post-op Pain Management:    Induction:   Airway Management Planned:   Additional Equipment:   Intra-op Plan:   Post-operative Plan:   Informed Consent: I have reviewed the patients History and Physical, chart, labs and discussed the procedure including the risks, benefits and alternatives for the proposed anesthesia with the patient or authorized representative who has indicated his/her understanding and acceptance.   Dental Advisory Given  Plan  Discussed with: Anesthesiologist, CRNA and Surgeon  Anesthesia Plan Comments:         Anesthesia Quick Evaluation

## 2015-07-17 NOTE — Anesthesia Procedure Notes (Signed)
Procedure Name: Intubation Date/Time: 07/17/2015 4:34 PM Performed by: Jonna Clark Pre-anesthesia Checklist: Patient identified, Patient being monitored, Timeout performed, Emergency Drugs available and Suction available Patient Re-evaluated:Patient Re-evaluated prior to inductionOxygen Delivery Method: Circle system utilized Preoxygenation: Pre-oxygenation with 100% oxygen Intubation Type: IV induction Ventilation: Mask ventilation without difficulty Laryngoscope Size: Mac and 3 Grade View: Grade I Tube type: Oral Tube size: 7.0 mm Number of attempts: 1 Airway Equipment and Method: Stylet Placement Confirmation: ETT inserted through vocal cords under direct vision,  positive ETCO2 and breath sounds checked- equal and bilateral Secured at: 20 cm Tube secured with: Tape Dental Injury: Teeth and Oropharynx as per pre-operative assessment

## 2015-07-18 ENCOUNTER — Encounter: Payer: Self-pay | Admitting: Obstetrics & Gynecology

## 2015-07-19 LAB — SURGICAL PATHOLOGY

## 2015-08-10 ENCOUNTER — Encounter: Payer: Self-pay | Admitting: Physician Assistant

## 2015-08-10 ENCOUNTER — Ambulatory Visit (INDEPENDENT_AMBULATORY_CARE_PROVIDER_SITE_OTHER): Payer: BC Managed Care – PPO | Admitting: Physician Assistant

## 2015-08-10 VITALS — BP 110/70 | HR 74 | Temp 98.3°F | Resp 16 | Wt 115.8 lb

## 2015-08-10 DIAGNOSIS — J020 Streptococcal pharyngitis: Secondary | ICD-10-CM

## 2015-08-10 DIAGNOSIS — J029 Acute pharyngitis, unspecified: Secondary | ICD-10-CM

## 2015-08-10 LAB — POCT RAPID STREP A (OFFICE): Rapid Strep A Screen: POSITIVE — AB

## 2015-08-10 MED ORDER — AMOXICILLIN 875 MG PO TABS: 875.0000 mg | ORAL_TABLET | Freq: Two times a day (BID) | ORAL | Status: DC

## 2015-08-10 NOTE — Patient Instructions (Signed)

## 2015-08-10 NOTE — Progress Notes (Signed)
Patient: Kelly Lane Female    DOB: Apr 16, 1996   20 y.o.   MRN: [id number] Visit Date: 08/10/2015  Today's Provider: Mar Daring, PA-C   Chief Complaint  Patient presents with  . Sore Throat   Subjective:    Sore Throat  This is a new problem. The current episode started in the past 7 days (Tuesday morning). The problem has been gradually worsening. The pain is worse on the right side. There has been no fever. The pain is at a severity of 5/10. The pain is mild. Associated symptoms include congestion (a little), coughing (a little), a hoarse voice, neck pain and trouble swallowing. Pertinent negatives include no abdominal pain, diarrhea, drooling, ear discharge, ear pain, headaches, plugged ear sensation, shortness of breath, swollen glands or vomiting. She has had no exposure to strep. She has tried gargles (with warm salt water) for the symptoms. The treatment provided no relief.       No Known Allergies Previous Medications   MULTIPLE VITAMIN (MULTIVITAMIN) CAPSULE    Take 1 capsule by mouth daily.   OXYCODONE-ACETAMINOPHEN (PERCOCET) 5-325 MG TABLET    Take 1 tablet by mouth every 4 (four) hours as needed for moderate pain or severe pain.    Review of Systems  Constitutional: Negative for fever, chills, appetite change and fatigue.  HENT: Positive for congestion (a little), hoarse voice, sore throat and trouble swallowing. Negative for drooling, ear discharge, ear pain, postnasal drip, rhinorrhea, sinus pressure and sneezing.   Eyes: Negative.   Respiratory: Positive for cough (a little). Negative for chest tightness, shortness of breath and wheezing.   Cardiovascular: Negative for chest pain, palpitations and leg swelling.  Gastrointestinal: Negative for vomiting, abdominal pain and diarrhea.  Musculoskeletal: Positive for neck pain.  Neurological: Negative for dizziness, light-headedness and headaches.    Social History  Substance Use Topics  . Smoking status:  Never Smoker   . Smokeless tobacco: Never Used  . Alcohol Use: No   Objective:   BP 110/70 mmHg  Pulse 74  Temp(Src) 98.3 F (36.8 C) (Oral)  Resp 16  Wt 115 lb 12.8 oz (52.527 kg)  LMP 07/19/2015  Physical Exam  Constitutional: She appears well-developed and well-nourished. No distress.  HENT:  Head: Normocephalic and atraumatic.  Right Ear: Hearing, tympanic membrane, external ear and ear canal normal.  Left Ear: Hearing, tympanic membrane, external ear and ear canal normal.  Nose: Mucosal edema and rhinorrhea present. Right sinus exhibits no maxillary sinus tenderness and no frontal sinus tenderness. Left sinus exhibits no maxillary sinus tenderness and no frontal sinus tenderness.  Mouth/Throat: Uvula is midline and mucous membranes are normal. No uvula swelling. Oropharyngeal exudate, posterior oropharyngeal edema and posterior oropharyngeal erythema present. No tonsillar abscesses.  Eyes: Conjunctivae are normal. Pupils are equal, round, and reactive to light. Right eye exhibits no discharge. Left eye exhibits no discharge. No scleral icterus.  Neck: Normal range of motion. Neck supple. No tracheal deviation present. No thyromegaly present.  Cardiovascular: Normal rate, regular rhythm and normal heart sounds.  Exam reveals no gallop and no friction rub.   No murmur heard. Pulmonary/Chest: Effort normal and breath sounds normal. No stridor. No respiratory distress. She has no wheezes. She has no rales.  Lymphadenopathy:    She has no cervical adenopathy.  Skin: Skin is warm and dry. She is not diaphoretic.  Vitals reviewed.       Assessment & Plan:     1. Strep throat  Will treat with amoxicillin as below. May continue salt water gargles for comfort and inflammation. May use chloraseptic spray for pain relief. Tylenol and IBU as needed for fevers and aches. Stay well hydrated and get plenty of rest. Call if no improvement. - amoxicillin (AMOXIL) 875 MG tablet; Take 1 tablet  (875 mg total) by mouth 2 (two) times daily.  Dispense: 20 tablet; Refill: 0  2. Sore throat Rapid strep positive. - POCT rapid strep A       Mar Daring, PA-C  [facility]

## 2015-08-20 ENCOUNTER — Telehealth: Payer: Self-pay

## 2015-08-20 DIAGNOSIS — J020 Streptococcal pharyngitis: Secondary | ICD-10-CM

## 2015-08-20 MED ORDER — CEFDINIR 300 MG PO CAPS: 300.0000 mg | ORAL_CAPSULE | Freq: Two times a day (BID) | ORAL | Status: DC

## 2015-08-20 NOTE — Telephone Encounter (Signed)
Called to Follow-Up: No answer LM for patient to call back if any concerns.  FYI: Patient called the [facility] call center stating that "RX is not working for her strep throat", denied triage.

## 2015-08-20 NOTE — Telephone Encounter (Signed)
Ok

## 2015-08-20 NOTE — Telephone Encounter (Signed)
Patient advised as directed below. Per patient she still have two more pills left of the old antibiotic but still concern that it was hurting the other days and still have the white patches in the back of her throat. Advised patient that depending how she continue to do to fill the RX. Per patient she will go get it because that she still sees the patches.  Thanks,  -Joseline

## 2015-08-20 NOTE — Telephone Encounter (Signed)
Per patient the   amoxicillin     Did helped her at the beginning but started with the sore throat on Friday again. She states she woke up feeling better today. No fever or trouble swallowing. She wants another RX to help.  Thanks,  -Joseline

## 2015-08-20 NOTE — Telephone Encounter (Signed)
Sent in new Rx to [facility] but she should should not fill this if she continues to not have sore throat and continues to fill better.

## 2015-08-21 ENCOUNTER — Telehealth: Payer: Self-pay | Admitting: Physician Assistant

## 2015-08-21 DIAGNOSIS — J020 Streptococcal pharyngitis: Secondary | ICD-10-CM

## 2015-08-21 MED ORDER — AMOXICILLIN 875 MG PO TABS: 875.0000 mg | ORAL_TABLET | Freq: Two times a day (BID) | ORAL | Status: DC

## 2015-08-21 NOTE — Telephone Encounter (Signed)
There is a message from 08/20/15 where pt asked for a stronger medication and cefdinir (OMNICEF) 300 MG capsule was sent to [facility] [address]. Pt called today and asked if she could get a refill on amoxicillin (AMOXIL) 875 MG tablet instead b/c she felt it was starting to help. Pt stated she has one dose left of the Amoxicillin. I advised that Tawanna Sat had already left for the day. Pt requested that another provider send in the refill. Please advise. Thanks TNP

## 2015-08-21 NOTE — Telephone Encounter (Signed)
Switched back to amoxil. Please notify pharmacy. Thank you!

## 2015-08-21 NOTE — Telephone Encounter (Signed)
LMTCB  Thanks,  -Joseline 

## 2015-08-21 NOTE — Telephone Encounter (Signed)
Spoke with Analiese, explained why Cefdinir was prescribed. Explained to patient that on 04/16 when she called the [facility] per Almon Register that the "RX is not working for her Strep Throat " denied Triage. Then explained that when I called her she said that her throat was not hurting and that she had two more pills and wanted another antibiotic. When I called her to explained what Tawanna Sat had advised, that she stated that she still needed another antibiotic because she had white patches. Today patient reports that she is feeling better and that it doesn't hurt when she swallows, but still have the white patches, but other than the patches she is feeling well. Doesn't feel that she needs this other antibiotic because she is doing better but wants the Amox because it started to work again and she just doesn't want the Strep to come back. She also wanted to know why she has white patches, explained that with strep you get patches in your throat and also with viral infections.  Please advised.  Thanks,  -Ellayna Hilligoss

## 2015-08-22 NOTE — Telephone Encounter (Signed)
Called pharmacy as directed below. Per pharmacist patient pick up the Cefdinir yesterday. Amox RX was canceled. Jenni aware.  Thanks,  -Quentez Lober

## 2016-01-25 ENCOUNTER — Telehealth: Payer: Self-pay | Admitting: Physician Assistant

## 2016-01-25 NOTE — Telephone Encounter (Signed)
It could be or she could have ovarian cyst on right side. If still sexually active she needs pregnancy test as well. Would recommend she follow up with her OB/GYN since she has been seen already for this.

## 2016-01-25 NOTE — Telephone Encounter (Signed)
Advised patient as below.  

## 2016-01-25 NOTE — Telephone Encounter (Signed)
Pt called saying she hasn't had period in two months but she has been having cramping on her right side.  Pt states she has been diagnosed before with endometriosis and is wondering if that is what is going on now.  She didn't want to make an appt right now.   She would like someone to call her back.  Her call back is [phone]  Thanks Con Memos

## 2016-01-25 NOTE — Telephone Encounter (Signed)
Left message to call back  

## 2016-01-29 ENCOUNTER — Telehealth: Payer: Self-pay | Admitting: Obstetrics and Gynecology

## 2016-01-29 NOTE — Telephone Encounter (Signed)
Called and left a message for patient to call back to schedule a new patient doctor referral with Dr. Talbert Nan.

## 2016-01-30 NOTE — Telephone Encounter (Signed)
Called and left a message for patient to call back to schedule a new patient doctor referral. °

## 2016-02-01 NOTE — Telephone Encounter (Signed)
Called and left a message for patient to call back to schedule a new patient doctor referral. °

## 2016-02-04 NOTE — Telephone Encounter (Signed)
Called and left a message for patient to call back to schedule a new patient doctor referral. °

## 2016-02-05 NOTE — Telephone Encounter (Signed)
Referral returned to referring office due to no returned calls from patient to schedule.

## 2016-03-03 ENCOUNTER — Ambulatory Visit (INDEPENDENT_AMBULATORY_CARE_PROVIDER_SITE_OTHER): Payer: BC Managed Care – PPO | Admitting: Physician Assistant

## 2016-03-03 ENCOUNTER — Encounter: Payer: Self-pay | Admitting: Physician Assistant

## 2016-03-03 VITALS — BP 102/70 | HR 84 | Temp 97.4°F | Resp 16 | Wt 116.0 lb

## 2016-03-03 DIAGNOSIS — R350 Frequency of micturition: Secondary | ICD-10-CM | POA: Diagnosis not present

## 2016-03-03 DIAGNOSIS — N644 Mastodynia: Secondary | ICD-10-CM

## 2016-03-03 DIAGNOSIS — Z3202 Encounter for pregnancy test, result negative: Secondary | ICD-10-CM | POA: Diagnosis not present

## 2016-03-03 NOTE — Progress Notes (Signed)
Patient: Kelly Lane Female    DOB: 1995/08/25   20 y.o.   MRN: [id number] Visit Date: 03/03/2016  Today's Provider: Trinna Post, PA-C   No chief complaint on file.  Subjective:    HPI Pt with history of endometriosis comes in today concern she could be pregnant.  Last period began on 02/13/2016 and lasted 5-7 days, lighter than usual.  She reports having unprotected sex, last time having unprotected sex was yesterday and then again, early October.  Her symptom includes cramping, breast tenderness, fatigue.  In absence of pregnancy, she is concerned her breast tenderness could be cancer. She denies breast pain, discharge, lumps or bumps, or skin lesions. No IVDU. Pt denies vaginal bleeding, but does have a discharge. No concerns for STI today. Great grandmother has history of breast cancer, unknown age, but no immediate family members. Patient does not wish to discuss contraception today.     No Known Allergies   Current Outpatient Prescriptions:  Marland Kitchen  Multiple Vitamin (MULTIVITAMIN) capsule, Take 1 capsule by mouth daily., Disp: , Rfl:   Review of Systems  Constitutional: Positive for fatigue. Negative for activity change, appetite change, chills, diaphoresis, fever and unexpected weight change.  Gastrointestinal: Positive for abdominal pain (Some mild cramping.). Negative for abdominal distention, anal bleeding, blood in stool, constipation, diarrhea, nausea, rectal pain and vomiting.  Genitourinary: Positive for frequency and pelvic pain. Negative for decreased urine volume, difficulty urinating, dyspareunia, dysuria, enuresis, flank pain, hematuria, menstrual problem, urgency, vaginal bleeding, vaginal discharge and vaginal pain.  Neurological: Positive for light-headedness (When she stands up too fast. ) and headaches. Negative for dizziness.    Social History  Substance Use Topics  . Smoking status: Never Smoker  . Smokeless tobacco: Never Used  . Alcohol use No     Objective:   BP 102/70 (BP Location: Left Arm, Patient Position: Sitting, Cuff Size: Normal)   Pulse 84   Temp 97.4 F (36.3 C) (Oral)   Resp 16   Wt 116 lb (52.6 kg)   LMP 02/13/2016   BMI 20.55 kg/m   Physical Exam  Constitutional: She appears well-developed and well-nourished. No distress.  Neck: Normal range of motion. Neck supple.  Cardiovascular: Normal rate and regular rhythm.   Pulmonary/Chest: Effort normal and breath sounds normal. Right breast exhibits tenderness. Right breast exhibits no inverted nipple, no mass, no nipple discharge and no skin change. Left breast exhibits tenderness. Left breast exhibits no inverted nipple, no mass, no nipple discharge and no skin change. Breasts are symmetrical.    Patient has dense feeling breasts bilaterally. Amorphous densities particularly noted at two o clock region in each breast. Not nodular, fixed, immobile. No signs of infection including visible lesions, erythema, drainage.   Abdominal: Soft. Bowel sounds are normal.  Genitourinary: No breast swelling, tenderness, discharge or bleeding. Pelvic exam was performed with patient prone.  Skin: Skin is warm and dry. No laceration and no lesion noted. No erythema.  Psychiatric: She has a normal mood and affect. Her behavior is normal.        Assessment & Plan:     Pregnancy test negative - Plan: Beta HCG, Quant  Breast tenderness in female - Plan: US BREAST COMPLETE UNI LEFT INC AXILLA, US BREAST LTD UNI RIGHT INC AXILLA  Frequency of urination - Plan: Urine Culture, Urinalysis, Routine w reflex microscopic  Patient is 20 y/o presenting with urinary frequency and breast tenderness with concerns of pregnancy. In  office urine pregnancy test was negative today. Counseled patient that based on sexual activity and timing of menstrual cycle, low risk of pregnancy, but will get HCG as above. Patient had more recent unprotected sexual exposure and counseled patient that could result in  pregnancy that would be undetectable at this point. Patient reports that she is not trying to get pregnant. Patient reports no contraindications to using barrier protection. Patient has no desire to discuss contraception today. Instructed patient that if she is not trying to get pregnant, she must use protection during every sexual encounter. Also instructed her to start taking prenatal vitamins, or at very least, multivitamin with [id number] mcg folic acid. Advised to abstain from alcohol consumption. Will send urine off for urinalysis and culture 2/2 urinary frequency.  Patient is extremely concerned about her breast tenderness. Tried to counsel patient about various causes, including cyclical changes in breast occurring with menstrual cycle as well as ligament pain with pendulous breasts. Patient's breasts dense on exam. Counseled patient on variation in breast tissue, especially in young patients. Patient remains concerned, so will get ultrasound to investigate. Counseled patient that mammogram would not be first line for this investigation.      No Follow-up on file.   Patient Instructions  First Trimester of Pregnancy The first trimester of pregnancy is from week 1 until the end of week 12 (months 1 through 3). A week after a sperm fertilizes an egg, the egg will implant on the wall of the uterus. This embryo will begin to develop into a baby. Genes from you and your partner are forming the baby. The female genes determine whether the baby is a boy or a girl. At 6-8 weeks, the eyes and face are formed, and the heartbeat can be seen on ultrasound. At the end of 12 weeks, all the baby's organs are formed.  Now that you are pregnant, you will want to do everything you can to have a healthy baby. Two of the most important things are to get good prenatal care and to follow your health care provider's instructions. Prenatal care is all the medical care you receive before the baby's birth. This care will help  prevent, find, and treat any problems during the pregnancy and childbirth. BODY CHANGES Your body goes through many changes during pregnancy. The changes vary from woman to woman.   You may gain or lose a couple of pounds at first.  You may feel sick to your stomach (nauseous) and throw up (vomit). If the vomiting is uncontrollable, call your health care provider.  You may tire easily.  You may develop headaches that can be relieved by medicines approved by your health care provider.  You may urinate more often. Painful urination may mean you have a bladder infection.  You may develop heartburn as a result of your pregnancy.  You may develop constipation because certain hormones are causing the muscles that push waste through your intestines to slow down.  You may develop hemorrhoids or swollen, bulging veins (varicose veins).  Your breasts may begin to grow larger and become tender. Your nipples may stick out more, and the tissue that surrounds them (areola) may become darker.  Your gums may bleed and may be sensitive to brushing and flossing.  Dark spots or blotches (chloasma, mask of pregnancy) may develop on your face. This will likely fade after the baby is born.  Your menstrual periods will stop.  You may have a loss of appetite.  You may develop  cravings for certain kinds of food.  You may have changes in your emotions from day to day, such as being excited to be pregnant or being concerned that something may go wrong with the pregnancy and baby.  You may have more vivid and strange dreams.  You may have changes in your hair. These can include thickening of your hair, rapid growth, and changes in texture. Some women also have hair loss during or after pregnancy, or hair that feels dry or thin. Your hair will most likely return to normal after your baby is born. WHAT TO EXPECT AT YOUR PRENATAL VISITS During a routine prenatal visit:  You will be weighed to make sure you  and the baby are growing normally.  Your blood pressure will be taken.  Your abdomen will be measured to track your baby's growth.  The fetal heartbeat will be listened to starting around week 10 or 12 of your pregnancy.  Test results from any previous visits will be discussed. Your health care provider may ask you:  How you are feeling.  If you are feeling the baby move.  If you have had any abnormal symptoms, such as leaking fluid, bleeding, severe headaches, or abdominal cramping.  If you are using any tobacco products, including cigarettes, chewing tobacco, and electronic cigarettes.  If you have any questions. Other tests that may be performed during your first trimester include:  Blood tests to find your blood type and to check for the presence of any previous infections. They will also be used to check for low iron levels (anemia) and Rh antibodies. Later in the pregnancy, blood tests for diabetes will be done along with other tests if problems develop.  Urine tests to check for infections, diabetes, or protein in the urine.  An ultrasound to confirm the proper growth and development of the baby.  An amniocentesis to check for possible genetic problems.  Fetal screens for spina bifida and Down syndrome.  You may need other tests to make sure you and the baby are doing well.  HIV (human immunodeficiency virus) testing. Routine prenatal testing includes screening for HIV, unless you choose not to have this test. HOME CARE INSTRUCTIONS  Medicines  Follow your health care provider's instructions regarding medicine use. Specific medicines may be either safe or unsafe to take during pregnancy.  Take your prenatal vitamins as directed.  If you develop constipation, try taking a stool softener if your health care provider approves. Diet  Eat regular, well-balanced meals. Choose a variety of foods, such as meat or vegetable-based protein, fish, milk and low-fat dairy  products, vegetables, fruits, and whole grain breads and cereals. Your health care provider will help you determine the amount of weight gain that is right for you.  Avoid raw meat and uncooked cheese. These carry germs that can cause birth defects in the baby.  Eating four or five small meals rather than three large meals a day may help relieve nausea and vomiting. If you start to feel nauseous, eating a few soda crackers can be helpful. Drinking liquids between meals instead of during meals also seems to help nausea and vomiting.  If you develop constipation, eat more high-fiber foods, such as fresh vegetables or fruit and whole grains. Drink enough fluids to keep your urine clear or pale yellow. Activity and Exercise  Exercise only as directed by your health care provider. Exercising will help you:  Control your weight.  Stay in shape.  Be prepared for labor and  delivery.  Experiencing pain or cramping in the lower abdomen or low back is a good sign that you should stop exercising. Check with your health care provider before continuing normal exercises.  Try to avoid standing for long periods of time. Move your legs often if you must stand in one place for a long time.  Avoid heavy lifting.  Wear low-heeled shoes, and practice good posture.  You may continue to have sex unless your health care provider directs you otherwise. Relief of Pain or Discomfort  Wear a good support bra for breast tenderness.   Take warm sitz baths to soothe any pain or discomfort caused by hemorrhoids. Use hemorrhoid cream if your health care provider approves.   Rest with your legs elevated if you have leg cramps or low back pain.  If you develop varicose veins in your legs, wear support hose. Elevate your feet for 15 minutes, 3-4 times a day. Limit salt in your diet. Prenatal Care  Schedule your prenatal visits by the twelfth week of pregnancy. They are usually scheduled monthly at first, then  more often in the last 2 months before delivery.  Write down your questions. Take them to your prenatal visits.  Keep all your prenatal visits as directed by your health care provider. Safety  Wear your seat belt at all times when driving.  Make a list of emergency phone numbers, including numbers for family, friends, the hospital, and police and fire departments. General Tips  Ask your health care provider for a referral to a local prenatal education class. Begin classes no later than at the beginning of month 6 of your pregnancy.  Ask for help if you have counseling or nutritional needs during pregnancy. Your health care provider can offer advice or refer you to specialists for help with various needs.  Do not use hot tubs, steam rooms, or saunas.  Do not douche or use tampons or scented sanitary pads.  Do not cross your legs for long periods of time.  Avoid cat litter boxes and soil used by cats. These carry germs that can cause birth defects in the baby and possibly loss of the fetus by miscarriage or stillbirth.  Avoid all smoking, herbs, alcohol, and medicines not prescribed by your health care provider. Chemicals in these affect the formation and growth of the baby.  Do not use any tobacco products, including cigarettes, chewing tobacco, and electronic cigarettes. If you need help quitting, ask your health care provider. You may receive counseling support and other resources to help you quit.  Schedule a dentist appointment. At home, brush your teeth with a soft toothbrush and be gentle when you floss. SEEK MEDICAL CARE IF:   You have dizziness.  You have mild pelvic cramps, pelvic pressure, or nagging pain in the abdominal area.  You have persistent nausea, vomiting, or diarrhea.  You have a bad smelling vaginal discharge.  You have pain with urination.  You notice increased swelling in your face, hands, legs, or ankles. SEEK IMMEDIATE MEDICAL CARE IF:   You have a  fever.  You are leaking fluid from your vagina.  You have spotting or bleeding from your vagina.  You have severe abdominal cramping or pain.  You have rapid weight gain or loss.  You vomit blood or material that looks like coffee grounds.  You are exposed to [place] measles and have never had them.  You are exposed to fifth disease or chickenpox.  You develop a severe headache.  You  have shortness of breath.  You have any kind of trauma, such as from a fall or a car accident.   This information is not intended to replace advice given to you by your health care provider. Make sure you discuss any questions you have with your health care provider.   Document Released: 04/15/2001 Document Revised: 05/12/2014 Document Reviewed: 03/01/2013 Elsevier Interactive Patient Education [employer].    The entirety of the information documented in the History of Present Illness, Review of Systems and Physical Exam were personally obtained by me. Portions of this information were initially documented by Ashley Royalty, CMA and reviewed by me for thoroughness and accuracy.    I have spent 25 minutes with this patient, > 50% of which was spent on counseling and coordination of care.   Trinna Post, PA-C  [facility]

## 2016-03-03 NOTE — Patient Instructions (Signed)
First Trimester of Pregnancy The first trimester of pregnancy is from week 1 until the end of week 12 (months 1 through 3). A week after a sperm fertilizes an egg, the egg will implant on the wall of the uterus. This embryo will begin to develop into a baby. Genes from you and your partner are forming the baby. The female genes determine whether the baby is a boy or a girl. At 6-8 weeks, the eyes and face are formed, and the heartbeat can be seen on ultrasound. At the end of 12 weeks, all the baby's organs are formed.  Now that you are pregnant, you will want to do everything you can to have a healthy baby. Two of the most important things are to get good prenatal care and to follow your health care provider's instructions. Prenatal care is all the medical care you receive before the baby's birth. This care will help prevent, find, and treat any problems during the pregnancy and childbirth. BODY CHANGES Your body goes through many changes during pregnancy. The changes vary from woman to woman.   You may gain or lose a couple of pounds at first.  You may feel sick to your stomach (nauseous) and throw up (vomit). If the vomiting is uncontrollable, call your health care provider.  You may tire easily.  You may develop headaches that can be relieved by medicines approved by your health care provider.  You may urinate more often. Painful urination may mean you have a bladder infection.  You may develop heartburn as a result of your pregnancy.  You may develop constipation because certain hormones are causing the muscles that push waste through your intestines to slow down.  You may develop hemorrhoids or swollen, bulging veins (varicose veins).  Your breasts may begin to grow larger and become tender. Your nipples may stick out more, and the tissue that surrounds them (areola) may become darker.  Your gums may bleed and may be sensitive to brushing and flossing.  Dark spots or blotches (chloasma,  mask of pregnancy) may develop on your face. This will likely fade after the baby is born.  Your menstrual periods will stop.  You may have a loss of appetite.  You may develop cravings for certain kinds of food.  You may have changes in your emotions from day to day, such as being excited to be pregnant or being concerned that something may go wrong with the pregnancy and baby.  You may have more vivid and strange dreams.  You may have changes in your hair. These can include thickening of your hair, rapid growth, and changes in texture. Some women also have hair loss during or after pregnancy, or hair that feels dry or thin. Your hair will most likely return to normal after your baby is born. WHAT TO EXPECT AT YOUR PRENATAL VISITS During a routine prenatal visit:  You will be weighed to make sure you and the baby are growing normally.  Your blood pressure will be taken.  Your abdomen will be measured to track your baby's growth.  The fetal heartbeat will be listened to starting around week 10 or 12 of your pregnancy.  Test results from any previous visits will be discussed. Your health care provider may ask you:  How you are feeling.  If you are feeling the baby move.  If you have had any abnormal symptoms, such as leaking fluid, bleeding, severe headaches, or abdominal cramping.  If you are using any tobacco products,   including cigarettes, chewing tobacco, and electronic cigarettes.  If you have any questions. Other tests that may be performed during your first trimester include:  Blood tests to find your blood type and to check for the presence of any previous infections. They will also be used to check for low iron levels (anemia) and Rh antibodies. Later in the pregnancy, blood tests for diabetes will be done along with other tests if problems develop.  Urine tests to check for infections, diabetes, or protein in the urine.  An ultrasound to confirm the proper growth  and development of the baby.  An amniocentesis to check for possible genetic problems.  Fetal screens for spina bifida and Down syndrome.  You may need other tests to make sure you and the baby are doing well.  HIV (human immunodeficiency virus) testing. Routine prenatal testing includes screening for HIV, unless you choose not to have this test. HOME CARE INSTRUCTIONS  Medicines  Follow your health care provider's instructions regarding medicine use. Specific medicines may be either safe or unsafe to take during pregnancy.  Take your prenatal vitamins as directed.  If you develop constipation, try taking a stool softener if your health care provider approves. Diet  Eat regular, well-balanced meals. Choose a variety of foods, such as meat or vegetable-based protein, fish, milk and low-fat dairy products, vegetables, fruits, and whole grain breads and cereals. Your health care provider will help you determine the amount of weight gain that is right for you.  Avoid raw meat and uncooked cheese. These carry germs that can cause birth defects in the baby.  Eating four or five small meals rather than three large meals a day may help relieve nausea and vomiting. If you start to feel nauseous, eating a few soda crackers can be helpful. Drinking liquids between meals instead of during meals also seems to help nausea and vomiting.  If you develop constipation, eat more high-fiber foods, such as fresh vegetables or fruit and whole grains. Drink enough fluids to keep your urine clear or pale yellow. Activity and Exercise  Exercise only as directed by your health care provider. Exercising will help you:  Control your weight.  Stay in shape.  Be prepared for labor and delivery.  Experiencing pain or cramping in the lower abdomen or low back is a good sign that you should stop exercising. Check with your health care provider before continuing normal exercises.  Try to avoid standing for long  periods of time. Move your legs often if you must stand in one place for a long time.  Avoid heavy lifting.  Wear low-heeled shoes, and practice good posture.  You may continue to have sex unless your health care provider directs you otherwise. Relief of Pain or Discomfort  Wear a good support bra for breast tenderness.   Take warm sitz baths to soothe any pain or discomfort caused by hemorrhoids. Use hemorrhoid cream if your health care provider approves.   Rest with your legs elevated if you have leg cramps or low back pain.  If you develop varicose veins in your legs, wear support hose. Elevate your feet for 15 minutes, 3-4 times a day. Limit salt in your diet. Prenatal Care  Schedule your prenatal visits by the twelfth week of pregnancy. They are usually scheduled monthly at first, then more often in the last 2 months before delivery.  Write down your questions. Take them to your prenatal visits.  Keep all your prenatal visits as directed by your   health care provider. Safety  Wear your seat belt at all times when driving.  Make a list of emergency phone numbers, including numbers for family, friends, the hospital, and police and fire departments. General Tips  Ask your health care provider for a referral to a local prenatal education class. Begin classes no later than at the beginning of month 6 of your pregnancy.  Ask for help if you have counseling or nutritional needs during pregnancy. Your health care provider can offer advice or refer you to specialists for help with various needs.  Do not use hot tubs, steam rooms, or saunas.  Do not douche or use tampons or scented sanitary pads.  Do not cross your legs for long periods of time.  Avoid cat litter boxes and soil used by cats. These carry germs that can cause birth defects in the baby and possibly loss of the fetus by miscarriage or stillbirth.  Avoid all smoking, herbs, alcohol, and medicines not prescribed by  your health care provider. Chemicals in these affect the formation and growth of the baby.  Do not use any tobacco products, including cigarettes, chewing tobacco, and electronic cigarettes. If you need help quitting, ask your health care provider. You may receive counseling support and other resources to help you quit.  Schedule a dentist appointment. At home, brush your teeth with a soft toothbrush and be gentle when you floss. SEEK MEDICAL CARE IF:   You have dizziness.  You have mild pelvic cramps, pelvic pressure, or nagging pain in the abdominal area.  You have persistent nausea, vomiting, or diarrhea.  You have a bad smelling vaginal discharge.  You have pain with urination.  You notice increased swelling in your face, hands, legs, or ankles. SEEK IMMEDIATE MEDICAL CARE IF:   You have a fever.  You are leaking fluid from your vagina.  You have spotting or bleeding from your vagina.  You have severe abdominal cramping or pain.  You have rapid weight gain or loss.  You vomit blood or material that looks like coffee grounds.  You are exposed to [place] measles and have never had them.  You are exposed to fifth disease or chickenpox.  You develop a severe headache.  You have shortness of breath.  You have any kind of trauma, such as from a fall or a car accident.   This information is not intended to replace advice given to you by your health care provider. Make sure you discuss any questions you have with your health care provider.   Document Released: 04/15/2001 Document Revised: 05/12/2014 Document Reviewed: 03/01/2013 Elsevier Interactive Patient Education 2016 [employer]  

## 2016-03-04 ENCOUNTER — Telehealth: Payer: Self-pay

## 2016-03-04 LAB — URINALYSIS, ROUTINE W REFLEX MICROSCOPIC
Bilirubin, UA: NEGATIVE
Bilirubin, UA: NEGATIVE
Glucose, UA: NEGATIVE
Glucose, UA: NEGATIVE
Ketones, UA: NEGATIVE
Ketones, UA: NEGATIVE
Leukocytes, UA: NEGATIVE
Leukocytes, UA: NEGATIVE
Nitrite, UA: NEGATIVE
Nitrite, UA: NEGATIVE
Protein, UA: NEGATIVE
Protein, UA: NEGATIVE
RBC, UA: NEGATIVE
RBC, UA: NEGATIVE
Specific Gravity, UA: 1.013 (ref 1.005–1.030)
Specific Gravity, UA: >=1.03 — AB (ref 1.005–1.030)
Urobilinogen, Ur: 0.2 mg/dL (ref 0.2–1.0)
Urobilinogen, Ur: 1 mg/dL (ref 0.2–1.0)
pH, UA: 6 (ref 5.0–7.5)
pH, UA: 6.5 (ref 5.0–7.5)

## 2016-03-04 LAB — BETA HCG QUANT (REF LAB): hCG Quant: <1 m[IU]/mL

## 2016-03-04 LAB — PLEASE NOTE

## 2016-03-04 NOTE — Telephone Encounter (Signed)
-----   Message from Trinna Post, [place] sent at 03/04/2016  8:21 AM EDT ----- Urinalysis was completely negative, indicating likely no urinary tract infection. Will still await culture. Pregnancy blood test was negative as well.

## 2016-03-04 NOTE — Telephone Encounter (Signed)
Pt advised.   Thanks,   -[place]  

## 2016-03-04 NOTE — Telephone Encounter (Signed)
LMTCB 03/04/2016  Thanks,   -Mickel Baas

## 2016-03-05 ENCOUNTER — Telehealth: Payer: Self-pay

## 2016-03-05 LAB — URINE CULTURE: Organism ID, Bacteria: NO GROWTH

## 2016-03-05 LAB — PLEASE NOTE

## 2016-03-05 NOTE — Telephone Encounter (Signed)
LMTCB 03/05/2016  Thanks,   -Pati Thinnes  

## 2016-03-05 NOTE — Telephone Encounter (Signed)
-----   Message from Trinna Post, [place] sent at 03/05/2016  8:15 AM EDT ----- Urine culture showed now growth. Please advise patient, thank you.

## 2016-03-06 NOTE — Telephone Encounter (Signed)
LMTCB-KW 

## 2016-03-07 NOTE — Telephone Encounter (Signed)
LMOVM for pt to return call 

## 2016-03-10 NOTE — Telephone Encounter (Signed)
LMTCB-KW 

## 2016-03-11 ENCOUNTER — Telehealth: Payer: Self-pay | Admitting: Physician Assistant

## 2016-03-11 NOTE — Telephone Encounter (Signed)
error 

## 2016-03-11 NOTE — Telephone Encounter (Signed)
Attempted to contact patient multiple times with no response, will mail letter to home. KW

## 2016-03-11 NOTE — Telephone Encounter (Signed)
PATIENT RETURNED CALL AND WAS ADVISED.KW

## 2016-03-12 ENCOUNTER — Ambulatory Visit
Admission: RE | Admit: 2016-03-12 | Discharge: 2016-03-12 | Disposition: A | Discharge status: Home / Self Care | Payer: BC Managed Care – PPO | Source: Ambulatory Visit | Attending: Physician Assistant | Admitting: Physician Assistant

## 2016-03-12 ENCOUNTER — Other Ambulatory Visit: Payer: Self-pay | Admitting: Physician Assistant

## 2016-03-12 ENCOUNTER — Telehealth: Payer: Self-pay

## 2016-03-12 DIAGNOSIS — N6314 Unspecified lump in the right breast, lower inner quadrant: Secondary | ICD-10-CM | POA: Diagnosis not present

## 2016-03-12 DIAGNOSIS — N644 Mastodynia: Secondary | ICD-10-CM | POA: Insufficient documentation

## 2016-03-12 DIAGNOSIS — N6310 Unspecified lump in the right breast, unspecified quadrant: Secondary | ICD-10-CM

## 2016-03-12 NOTE — Telephone Encounter (Signed)
LMTCB-KW 

## 2016-03-12 NOTE — Telephone Encounter (Signed)
-----   Message from Trinna Post, [place] sent at 03/12/2016 11:16 AM EST ----- Ultrasound revealed no findings in left breast. 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 Recommended followup US right breast in six months. I will schedule this. Patient does not need appointment for this. Please advise, thank you.

## 2016-03-12 NOTE — Telephone Encounter (Signed)
Patient has been advised. KW 

## 2016-03-12 NOTE — Progress Notes (Signed)
Patient recently underwent breast ultrasound. Findings consistent with fibroadenoma at 4:30 o'clock position in right breast. Recommended followup for stability at regular intervals the first being six months. I have scheduled this.

## 2016-04-11 ENCOUNTER — Telehealth: Payer: Self-pay | Admitting: Physician Assistant

## 2016-04-11 ENCOUNTER — Ambulatory Visit (INDEPENDENT_AMBULATORY_CARE_PROVIDER_SITE_OTHER): Payer: BC Managed Care – PPO | Admitting: Physician Assistant

## 2016-04-11 ENCOUNTER — Other Ambulatory Visit: Payer: Self-pay | Admitting: Physician Assistant

## 2016-04-11 ENCOUNTER — Encounter: Payer: Self-pay | Admitting: Physician Assistant

## 2016-04-11 VITALS — BP 118/86 | HR 96 | Temp 98.6°F | Resp 16 | Wt 116.0 lb

## 2016-04-11 DIAGNOSIS — R590 Localized enlarged lymph nodes: Secondary | ICD-10-CM | POA: Diagnosis not present

## 2016-04-11 NOTE — Telephone Encounter (Signed)
Noted, thank you

## 2016-04-11 NOTE — Telephone Encounter (Signed)
Pt said she was in this am and seen Adriana.  She wants Adriana to know since her exam she has had some light bleeding.  Please advise.  [phone]  Thanks Con Memos

## 2016-04-11 NOTE — Patient Instructions (Signed)
Lymphadenopathy Introduction Lymphadenopathy refers to swollen or enlarged lymph glands, also called lymph nodes. Lymph glands are part of your body's defense (immune) system, which protects the body from infections, germs, and diseases. Lymph glands are found in many locations in your body, including the neck, underarm, and groin. Many things can cause lymph glands to become enlarged. When your immune system responds to germs, such as viruses or bacteria, infection-fighting cells and fluid build up. This causes the glands to grow in size. Usually, this is not something to worry about. The swelling and any soreness often go away without treatment. However, swollen lymph glands can also be caused by a number of diseases. Your health care provider may do various tests to help determine the cause. If the cause of your swollen lymph glands cannot be found, it is important to monitor your condition to make sure the swelling goes away. Follow these instructions at home: Watch your condition for any changes. The following actions may help to lessen any discomfort you are feeling:  Get plenty of rest.  Take medicines only as directed by your health care provider. Your health care provider may recommend over-the-counter medicines for pain.  Apply moist heat compresses to the site of swollen lymph nodes as directed by your health care provider. This can help reduce any pain.  Check your lymph nodes daily for any changes.  Keep all follow-up visits as directed by your health care provider. This is important. Contact a health care provider if:  Your lymph nodes are still swollen after 2 weeks.  Your swelling increases or spreads to other areas.  Your lymph nodes are hard, seem fixed to the skin, or are growing rapidly.  Your skin over the lymph nodes is red and inflamed.  You have a fever.  You have chills.  You have fatigue.  You develop a sore throat.  You have abdominal pain.  You have  weight loss.  You have night sweats. Get help right away if:  You notice fluid leaking from the area of the enlarged lymph node.  You have severe pain in any area of your body.  You have chest pain.  You have shortness of breath. This information is not intended to replace advice given to you by your health care provider. Make sure you discuss any questions you have with your health care provider. Document Released: 01/29/2008 Document Revised: 09/27/2015 Document Reviewed: 11/24/2013  2017 Elsevier  

## 2016-04-11 NOTE — Progress Notes (Signed)
Patient: Kelly Lane Female    DOB: 09-01-1995   20 y.o.   MRN: [id number] Visit Date: 04/11/2016  Today's Provider: Trinna Post, PA-C   Chief Complaint  Patient presents with  . Vaginal Pain    Started last week on the right side.  Now also with a "Knot" on her right groin area.    Subjective:    Vaginal Pain  The patient's primary symptoms include vaginal discharge. The patient's pertinent negatives include no pelvic pain. This is a new problem. The current episode started in the past 7 days. Episode frequency: Groin pain is constant, vaginal pain comes and goes.  The problem has been unchanged. The problem affects the right side. Associated symptoms include abdominal pain, chills, headaches and painful intercourse. Pertinent negatives include no back pain, constipation, diarrhea, dysuria, fever, frequency, hematuria, nausea, urgency or vomiting. The vaginal discharge was normal. There has been no bleeding. She has not been passing clots. She has not been passing tissue. She has tried warm baths for the symptoms. She is sexually active. It is possible that her partner has an STD. She uses nothing for contraception. Her past medical history is significant for endometriosis.   Patient is a 20 y/o female with a history of unprotected sex presenting today with right sided groin pain. The pain started one week ago, and several days ago she noticed lumps in her right groin region. The lumps have not drained anything, but they are tender to touch. She denies rash on her hands and feet. She denies any lesions in her vaginal or rectal area. No vaginal or rectal bleeding. LMP on 03/31/16. Possibly some different vaginal discharge.     No Known Allergies   Current Outpatient Prescriptions:  Marland Kitchen  Multiple Vitamin (MULTIVITAMIN) capsule, Take 1 capsule by mouth daily., Disp: , Rfl:   Review of Systems  Constitutional: Positive for chills. Negative for activity change, appetite change,  diaphoresis, fatigue, fever and unexpected weight change.  Gastrointestinal: Positive for abdominal pain. Negative for abdominal distention, anal bleeding, blood in stool, constipation, diarrhea, nausea, rectal pain and vomiting.  Genitourinary: Positive for vaginal discharge and vaginal pain. Negative for decreased urine volume, dysuria, frequency, hematuria, pelvic pain, urgency and vaginal bleeding.  Musculoskeletal: Negative for back pain.  Neurological: Positive for headaches. Negative for dizziness and light-headedness.    Social History  Substance Use Topics  . Smoking status: Never Smoker  . Smokeless tobacco: Never Used  . Alcohol use No   Objective:   BP 118/86 (BP Location: Left Arm, Patient Position: Sitting, Cuff Size: Normal)   Pulse 96   Temp 98.6 F (37 C) (Oral)   Resp 16   Wt 116 lb (52.6 kg)   LMP 03/24/2016   BMI 20.55 kg/m   Physical Exam  Constitutional: She appears well-developed and well-nourished. No distress.  Cardiovascular: Normal rate.   Abdominal: Soft. Bowel sounds are normal. She exhibits no distension and no mass. There is no tenderness. There is no rebound and no guarding. Hernia confirmed negative in the right inguinal area and confirmed negative in the left inguinal area.  Genitourinary: Vagina normal and uterus normal. Rectal exam shows no external hemorrhoid and no fissure. Pelvic exam was performed with patient supine. No labial fusion. There is no rash, tenderness, lesion or injury on the right labia. There is no rash, tenderness, lesion or injury on the left labia. Uterus is not deviated, not enlarged, not fixed and not tender. Cervix  exhibits no motion tenderness, no discharge and no friability. Right adnexum displays no mass, no tenderness and no fullness. Left adnexum displays no mass, no tenderness and no fullness. No erythema, tenderness or bleeding in the vagina. No foreign body in the vagina. No signs of injury around the vagina. No vaginal  discharge found.  Genitourinary Comments: There are two enlarged right inguinal lymph nodes, approximately 1 cm each. Mobile and tender to palpation. No surrounding erythema or pus drainage.   Lymphadenopathy:       Right: Inguinal adenopathy present.       Left: No inguinal adenopathy present.  Skin: No rash noted. She is not diaphoretic.        Assessment & Plan:      Problem List Items Addressed This Visit    None    Visit Diagnoses    Inguinal lymphadenopathy    -  Primary   Relevant Orders   RPR   CBC with Differential/Platelet   HIV antibody (with reflex)   NuSwab Vaginitis Plus (VG+)     Patient is a 20 y/o presenting with acute right inguinal lymphadenopathy. Afebrile, nontoxic today in office. Pelvic exam looks relatively benign, but will wait for NuSwab. Does have history of unprotected sex, so will send for RPR and HIV antibody. Patient may do warm bath soaks and NSAIDs for pain relief. If lab work comes out normal, will get ultrasound to assess.   Return if symptoms worsen or fail to improve.   Patient Instructions  Lymphadenopathy Introduction Lymphadenopathy refers to swollen or enlarged lymph glands, also called lymph nodes. Lymph glands are part of your body's defense (immune) system, which protects the body from infections, germs, and diseases. Lymph glands are found in many locations in your body, including the neck, underarm, and groin. Many things can cause lymph glands to become enlarged. When your immune system responds to germs, such as viruses or bacteria, infection-fighting cells and fluid build up. This causes the glands to grow in size. Usually, this is not something to worry about. The swelling and any soreness often go away without treatment. However, swollen lymph glands can also be caused by a number of diseases. Your health care provider may do various tests to help determine the cause. If the cause of your swollen lymph glands cannot be found, it is  important to monitor your condition to make sure the swelling goes away. Follow these instructions at home: Watch your condition for any changes. The following actions may help to lessen any discomfort you are feeling:  Get plenty of rest.  Take medicines only as directed by your health care provider. Your health care provider may recommend over-the-counter medicines for pain.  Apply moist heat compresses to the site of swollen lymph nodes as directed by your health care provider. This can help reduce any pain.  Check your lymph nodes daily for any changes.  Keep all follow-up visits as directed by your health care provider. This is important. Contact a health care provider if:  Your lymph nodes are still swollen after 2 weeks.  Your swelling increases or spreads to other areas.  Your lymph nodes are hard, seem fixed to the skin, or are growing rapidly.  Your skin over the lymph nodes is red and inflamed.  You have a fever.  You have chills.  You have fatigue.  You develop a sore throat.  You have abdominal pain.  You have weight loss.  You have night sweats. Get help right  away if:  You notice fluid leaking from the area of the enlarged lymph node.  You have severe pain in any area of your body.  You have chest pain.  You have shortness of breath. This information is not intended to replace advice given to you by your health care provider. Make sure you discuss any questions you have with your health care provider. Document Released: 01/29/2008 Document Revised: 09/27/2015 Document Reviewed: 11/24/2013  2017 [employer]vier    The entirety of the information documented in the History of Present Illness, Review of Systems and Physical Exam were personally obtained by me. Portions of this information were initially documented by Ashley Royalty, CMA and reviewed by me for thoroughness and accuracy.       Trinna Post, PA-C  [facility]

## 2016-04-12 LAB — CBC WITH DIFFERENTIAL/PLATELET
Basophils Absolute: 0.1 10*3/uL (ref 0.0–0.2)
Basos: 1 %
EOS (ABSOLUTE): 0.2 10*3/uL (ref 0.0–0.4)
Eos: 2 %
Hematocrit: 37.2 % (ref 34.0–46.6)
Hemoglobin: 12.7 g/dL (ref 11.1–15.9)
Immature Grans (Abs): 0 10*3/uL (ref 0.0–0.1)
Immature Granulocytes: 0 %
Lymphocytes Absolute: 2.4 10*3/uL (ref 0.7–3.1)
Lymphs: 32 %
MCH: 31.5 pg (ref 26.6–33.0)
MCHC: 34.1 g/dL (ref 31.5–35.7)
MCV: 92 fL (ref 79–97)
Monocytes Absolute: 0.9 10*3/uL (ref 0.1–0.9)
Monocytes: 12 %
Neutrophils Absolute: 3.8 10*3/uL (ref 1.4–7.0)
Neutrophils: 53 %
Platelets: 199 10*3/uL (ref 150–379)
RBC: 4.03 x10E6/uL (ref 3.77–5.28)
RDW: 13 % (ref 12.3–15.4)
WBC: 7.3 10*3/uL (ref 3.4–10.8)

## 2016-04-12 LAB — HIV ANTIBODY (ROUTINE TESTING W REFLEX): HIV Screen 4th Generation wRfx: NONREACTIVE

## 2016-04-12 LAB — RPR: RPR Ser Ql: NONREACTIVE

## 2016-04-15 ENCOUNTER — Telehealth: Payer: Self-pay

## 2016-04-15 NOTE — Telephone Encounter (Signed)
LMTCB 04/15/2016  Thanks,   -Mickel Baas

## 2016-04-15 NOTE — Telephone Encounter (Signed)
-----   Message from Trinna Post, [place] sent at 04/15/2016  8:53 AM EST ----- Negative results for syphilis, HIV, gonorrhea, chlamydia, trichomonas. CBC was normal with no white count elevation. Waiting on BV and yeast infection results. Has patients groin pain/lump gone away or decreased?

## 2016-04-15 NOTE — Telephone Encounter (Signed)
There is not one ordered because I discussed with patient that the results are difficult to interpret in absence of a lesion and we do not treat unless there is an outbreak. I can order it, it may show a recent infection or it may not, but this will still not localize the infection. If after ten days of symptoms, we can try empiric antibiotics and if those do not work we can proceed with imaging.

## 2016-04-15 NOTE — Telephone Encounter (Signed)
Pt advised.  She will call back next week if she still has a swollen lymph node.   Thanks,    -Mickel Baas

## 2016-04-15 NOTE — Telephone Encounter (Signed)
Pt advised and verbally acknowledges understanding. Pt states the area is still swollen and painful. Pt is requesting a herpes blood test, and I did not see where one was ordered. Is this okay to order? Renaldo Fiddler, CMA

## 2016-04-16 ENCOUNTER — Telehealth: Payer: Self-pay

## 2016-04-16 ENCOUNTER — Other Ambulatory Visit: Payer: Self-pay

## 2016-04-16 LAB — NUSWAB VAGINITIS PLUS (VG+)
Atopobium vaginae: HIGH Score — AB
BVAB 2: HIGH Score — AB
Candida albicans, NAA: NEGATIVE
Candida glabrata, NAA: NEGATIVE
Chlamydia trachomatis, NAA: NEGATIVE
Megasphaera 1: HIGH Score — AB
Neisseria gonorrhoeae, NAA: NEGATIVE
Trich vag by NAA: NEGATIVE

## 2016-04-16 MED ORDER — METRONIDAZOLE 500 MG PO TABS: 500.0000 mg | ORAL_TABLET | Freq: Two times a day (BID) | ORAL | 0 refills | Status: DC

## 2016-04-16 NOTE — Telephone Encounter (Signed)
-----   Message from Trinna Post, [place] sent at 04/16/2016  8:23 AM EST ----- Nu swab came back positive for Bv. First line tx is metronidazole, would patient like the pill or gel?

## 2016-04-16 NOTE — Telephone Encounter (Signed)
Patient advised and pharmacy called.  ED

## 2016-04-16 NOTE — Telephone Encounter (Signed)
LMTCB 04/16/2016  Thanks,   -Mickel Baas

## 2016-04-16 NOTE — Telephone Encounter (Signed)
Pt returned call. Thanks TNP °

## 2016-05-02 ENCOUNTER — Telehealth: Payer: Self-pay

## 2016-05-02 NOTE — Telephone Encounter (Signed)
Patient c/o irregular menstrual period. Patient reports that she had her period about 2 weeks ago and started having a regular flow again on Tuesday. Patient reports more clots than usual. Patient reports she is having to change her pad at least 2-3 times a day. Patient reports fatigue denies any other symptoms. Patient reports she is sexually active and has been off of birth control since July. Please advise. sd

## 2016-05-02 NOTE — Telephone Encounter (Signed)
Patient does have h/o ovarian cyst which can cause irregular menstruation. She has seen Dr. Kenton Kingfisher for this in the past. We have recommended OCP or other form of contraception to help with this. Patient has refused or not stayed on anything prescribed. She can schedule appt sometime this week or next to discuss further if she is interested in starting any treatments to help.

## 2016-05-06 NOTE — Telephone Encounter (Signed)
Patient advised as below. Patient reports she will call later on to schedule an appointment.

## 2016-05-20 ENCOUNTER — Ambulatory Visit: Payer: BC Managed Care – PPO | Admitting: Physician Assistant

## 2016-06-02 ENCOUNTER — Ambulatory Visit (INDEPENDENT_AMBULATORY_CARE_PROVIDER_SITE_OTHER): Payer: BC Managed Care – PPO | Admitting: Physician Assistant

## 2016-06-02 ENCOUNTER — Encounter: Payer: Self-pay | Admitting: Physician Assistant

## 2016-06-02 VITALS — BP 110/64 | HR 84 | Temp 98.5°F | Resp 16 | Wt 113.0 lb

## 2016-06-02 DIAGNOSIS — Z7251 High risk heterosexual behavior: Secondary | ICD-10-CM

## 2016-06-02 DIAGNOSIS — Z833 Family history of diabetes mellitus: Secondary | ICD-10-CM | POA: Diagnosis not present

## 2016-06-02 DIAGNOSIS — Z0000 Encounter for general adult medical examination without abnormal findings: Secondary | ICD-10-CM | POA: Diagnosis not present

## 2016-06-02 DIAGNOSIS — F329 Major depressive disorder, single episode, unspecified: Secondary | ICD-10-CM | POA: Diagnosis not present

## 2016-06-02 DIAGNOSIS — Z8249 Family history of ischemic heart disease and other diseases of the circulatory system: Secondary | ICD-10-CM | POA: Diagnosis not present

## 2016-06-02 DIAGNOSIS — R4589 Other symptoms and signs involving emotional state: Secondary | ICD-10-CM

## 2016-06-02 DIAGNOSIS — N926 Irregular menstruation, unspecified: Secondary | ICD-10-CM | POA: Diagnosis not present
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
## 2016-06-02 NOTE — Patient Instructions (Signed)

## 2016-06-05 ENCOUNTER — Telehealth: Payer: Self-pay | Admitting: Physician Assistant

## 2016-06-05 ENCOUNTER — Other Ambulatory Visit: Payer: Self-pay | Admitting: Physician Assistant

## 2016-06-05 DIAGNOSIS — N760 Acute vaginitis: Principal | ICD-10-CM

## 2016-06-05 DIAGNOSIS — B9689 Other specified bacterial agents as the cause of diseases classified elsewhere: Secondary | ICD-10-CM

## 2016-06-05 LAB — NUSWAB VAGINITIS PLUS (VG+)
Atopobium vaginae: HIGH Score — AB
BVAB 2: HIGH Score — AB
Candida albicans, NAA: NEGATIVE
Candida glabrata, NAA: NEGATIVE
Chlamydia trachomatis, NAA: NEGATIVE
Megasphaera 1: HIGH Score — AB
Neisseria gonorrhoeae, NAA: NEGATIVE
Trich vag by NAA: NEGATIVE

## 2016-06-05 MED ORDER — METRONIDAZOLE 500 MG PO TABS
500.0000 mg | ORAL_TABLET | Freq: Two times a day (BID) | ORAL | 0 refills | Status: AC
Start: 2016-06-05 — End: 2016-06-12

## 2016-06-05 MED ORDER — METRONIDAZOLE 0.75 % VA GEL: 1.0000 | Freq: Two times a day (BID) | VAGINAL | 0 refills | Status: DC

## 2016-06-05 NOTE — Telephone Encounter (Signed)
Pt stated that the metroNIDAZOLE (METROGEL VAGINAL) 0.75 % vaginal gel was to expensive and she would like the pill form sent to [facility] today if possible. Please advise. Thanks TNP

## 2016-06-05 NOTE — Telephone Encounter (Signed)
Please Review

## 2016-06-05 NOTE — Progress Notes (Unsigned)
Sent in [employer].

## 2016-06-05 NOTE — Telephone Encounter (Signed)
Sent in metronidazole pills. One pill twice daily for seven days. Refrain from alcohol during and three days after treatment. Refrain from sexual activity or at least unprotected sexual activity during treatment.

## 2016-06-06 NOTE — Telephone Encounter (Signed)
Pt advised-aa 

## 2016-06-06 NOTE — Telephone Encounter (Signed)
lmtcb-aa 

## 2016-06-13 ENCOUNTER — Telehealth: Payer: Self-pay

## 2016-06-13 NOTE — Telephone Encounter (Signed)
lmtcb Emily Drozdowski, CMA  

## 2016-06-13 NOTE — Telephone Encounter (Signed)
Pt advised. Emily Drozdowski, CMA  

## 2016-06-13 NOTE — Telephone Encounter (Signed)
Patient called and states that she has been taking Metronidazole tablets since 06/06/16 and finished this medication yesterday. She had metallic taste while taking this medication which she thinks is possible but yesterday she developed some dizziness and nausea, this morning also started having cramping and started to bleed like her menstrual cycle started which is 4 days early she states. She wanted to know if these symptoms can be side effect from the medication? Symptoms started yesterday. Denies any other symptoms. Please review-aa

## 2016-06-13 NOTE — Telephone Encounter (Signed)
It is possible for some nausea and dizziness with this medication. The metallic taste is also common.  It is possible that the symptoms could just have been premenstrual symptoms as well. It is common for illness and stressors to cause the menstrual cycle to change in regularity. Symptoms should improve since you have stopped the medication if it was from the medication.

## 2016-07-01 ENCOUNTER — Telehealth: Payer: Self-pay | Admitting: Physician Assistant

## 2016-07-01 DIAGNOSIS — N760 Acute vaginitis: Principal | ICD-10-CM

## 2016-07-01 DIAGNOSIS — B9689 Other specified bacterial agents as the cause of diseases classified elsewhere: Secondary | ICD-10-CM

## 2016-07-01 NOTE — Telephone Encounter (Signed)
Pt called saying she has been using the flagyl and she thinks her infection is coming back again  Please advise.   [phone]  Thanks, Con Memos

## 2016-07-02 MED ORDER — CLINDAMYCIN HCL 300 MG PO CAPS: 300.0000 mg | ORAL_CAPSULE | Freq: Two times a day (BID) | ORAL | 0 refills | Status: AC

## 2016-07-02 NOTE — Telephone Encounter (Signed)
Pt advised. Please send in Clindamycin pt wants to try this medication-aa

## 2016-07-02 NOTE — Telephone Encounter (Signed)
We can try clindamycin orally if patient wishes. Patient should also be using probiotic with at least ten strains. Should also be engaging in protected sex to maintain pH values. Does patient want clindamycin sent in?

## 2016-07-02 NOTE — Telephone Encounter (Signed)
Adriana,  I know you have seen her recently for all of this. I didn't know if you want to address this. If not, she can schedule an appt with me for recheck.  Thanks, JB

## 2016-07-02 NOTE — Telephone Encounter (Signed)
lmtcb-aa 

## 2016-07-02 NOTE — Telephone Encounter (Signed)
Noted pt was advised to check with pharmacy later-aa

## 2016-07-02 NOTE — Telephone Encounter (Signed)
Sent in clindamycin 300 mg twice daily for seven days.

## 2016-07-25 ENCOUNTER — Encounter: Payer: Self-pay | Admitting: Physician Assistant

## 2016-07-25 ENCOUNTER — Ambulatory Visit (INDEPENDENT_AMBULATORY_CARE_PROVIDER_SITE_OTHER): Payer: BC Managed Care – PPO | Admitting: Physician Assistant

## 2016-07-25 VITALS — BP 90/62 | HR 67 | Temp 98.2°F | Resp 16 | Wt 116.4 lb

## 2016-07-25 DIAGNOSIS — R110 Nausea: Secondary | ICD-10-CM

## 2016-07-25 DIAGNOSIS — R195 Other fecal abnormalities: Secondary | ICD-10-CM | POA: Diagnosis not present

## 2016-07-25 DIAGNOSIS — N809 Endometriosis, unspecified: Secondary | ICD-10-CM

## 2016-07-25 DIAGNOSIS — Z3009 Encounter for other general counseling and advice on contraception: Secondary | ICD-10-CM

## 2016-07-25 DIAGNOSIS — Z7251 High risk heterosexual behavior: Secondary | ICD-10-CM | POA: Diagnosis not present

## 2016-07-25 LAB — POCT URINE PREGNANCY: Preg Test, Ur: NEGATIVE

## 2016-07-25 NOTE — Progress Notes (Signed)
       Patient: Kelly Lane Female    DOB: 23-Nov-1995   20 y.o.   MRN: [id number] Visit Date: 07/25/2016  Today's Provider: Mar Daring, PA-C   Chief Complaint  Patient presents with  . Abdominal Discomfort   Subjective:    HPI  She reports that she has loose stools for the past two weeks. She reports that she has notice some cramping/doscomfort in her stomach after she eats. She reports her stool is "soft".  Associated symptoms nausea and pelvic pain. Problem is unchanged since it started. No fever, no hard stools, abdominal pain, vomiting or diarrhea. She denies acid reflux symptoms.   She is not concerned as much about her stools. She reports more of a pelvic discomfort. She reports having unprotected sex and is wanting a pregnancy test.   No Known Allergies   Current Outpatient Prescriptions:  Marland Kitchen  Multiple Vitamin (MULTIVITAMIN) capsule, Take 1 capsule by mouth daily., Disp: , Rfl:   Review of Systems  Constitutional: Negative.   Respiratory: Negative.   Cardiovascular: Negative for chest pain, palpitations and leg swelling.  Gastrointestinal: Positive for abdominal distention (gassy) and nausea. Negative for abdominal pain, anal bleeding, blood in stool, constipation, diarrhea, rectal pain and vomiting.  Genitourinary: Positive for pelvic pain. Negative for vaginal bleeding, vaginal discharge and vaginal pain.    Social History  Substance Use Topics  . Smoking status: Never Smoker  . Smokeless tobacco: Never Used  . Alcohol use Yes     Comment: every six months maybe 1 drink   Objective:   BP 90/62 (BP Location: Right Arm, Patient Position: Sitting, Cuff Size: Normal)   Pulse 67   Temp 98.2 F (36.8 C) (Oral)   Resp 16   Wt 116 lb 6.4 oz (52.8 kg)   LMP 07/10/2016   BMI 20.62 kg/m    Physical Exam  Constitutional: She is oriented to person, place, and time. She appears well-developed and well-nourished. No distress.  Cardiovascular: Normal rate, regular  rhythm and normal heart sounds.  Exam reveals no gallop and no friction rub.   No murmur heard. Pulmonary/Chest: Effort normal and breath sounds normal. No respiratory distress. She has no wheezes. She has no rales.  Abdominal: Soft. Normal appearance and bowel sounds are normal. She exhibits no distension and no mass. There is no hepatosplenomegaly. There is no tenderness. There is no rebound, no guarding and no CVA tenderness.  Neurological: She is alert and oriented to person, place, and time.  Skin: Skin is warm and dry. She is not diaphoretic.      Assessment & Plan:     1. Encounter for counseling regarding contraception Patient has had previous diagnosis of endometriosis by GYN and is wanting to discuss getting the Implanon device for contraception and to lessen endometrial symptoms. Referral placed as below.  - Ambulatory referral to Gynecology  2. Endometriosis determined by laparoscopy See above medical treatment plan. - Ambulatory referral to Gynecology  3. Unprotected sexual intercourse Urine pregnancy negative.  - POCT urine pregnancy  4. Nausea Has been improving. She is to call if symptoms return.  5. Loose stools Improving.       Mar Daring, PA-C  [facility]

## 2016-07-25 NOTE — Patient Instructions (Signed)

## 2016-09-02 ENCOUNTER — Other Ambulatory Visit: Payer: Self-pay | Admitting: Obstetrics & Gynecology

## 2016-09-10 ENCOUNTER — Ambulatory Visit: Admission: RE | Admit: 2016-09-10 | Payer: BC Managed Care – PPO | Source: Ambulatory Visit

## 2016-09-23 ENCOUNTER — Telehealth: Payer: Self-pay

## 2016-09-23 NOTE — Telephone Encounter (Signed)
Pt left msg on triage stating that she needs a note for work but did not say what for. Left msg for pt to call back for additional details.

## 2016-09-23 NOTE — Telephone Encounter (Signed)
We haven't seen pt in over a year Kelly Lane pt anyway), so can't give note. If in that much pain, she needs appt with [facility]. RN to notify pt.

## 2016-09-23 NOTE — Telephone Encounter (Signed)
Pt aware.

## 2016-09-23 NOTE — Telephone Encounter (Signed)
Pt states that her endometriosis has been acting up and that is why she has called out of work for the past several days. Pt needs work note for tonight to be able to go back to work. Advised we may not be able to write this since she has not recently been seen or treated for this. Please advise. Thank you. *Pt also knows that we are able to see that she contacted her PCP to request a note as well*

## 2016-09-23 NOTE — Telephone Encounter (Signed)
Is this something we can do for the patient?-aa jennis patient

## 2016-09-23 NOTE — Telephone Encounter (Signed)
Patient is requesting a note to go back to work. Patient reports that she has been out of work for the last 4 days due to endometriosis. Patient is requesting to pick up note today. CB# [phone]. sd

## 2016-09-23 NOTE — Telephone Encounter (Signed)
I have never seen her so I am not willing to write a work excuse.

## 2016-09-23 NOTE — Telephone Encounter (Signed)
I advised patient as below and she states that she called [facility] and the denied writing her a note. Patient states that she needs this note for work by the end of the day and that Tawanna Sat recently saw her in the office for endometriosis.

## 2016-09-23 NOTE — Telephone Encounter (Signed)
LMTCB-KW 

## 2016-09-24 ENCOUNTER — Telehealth: Payer: Self-pay | Admitting: Obstetrics and Gynecology

## 2016-09-24 NOTE — Telephone Encounter (Signed)
Called and left a message for patient to call back to schedule a new patient doctor referral for history of endometriosis and recurrent BV.

## 2016-09-26 NOTE — Telephone Encounter (Signed)
Called and left a message for patient to call back to schedule a new patient doctor referral. °

## 2016-09-30 NOTE — Telephone Encounter (Signed)
Called and left a message for patient to call back to schedule a new patient doctor referral. °

## 2016-10-01 NOTE — Telephone Encounter (Signed)
Routing back to referring office. Patient has not returned calls to schedule.

## 2016-10-16 ENCOUNTER — Telehealth: Payer: Self-pay | Admitting: Physician Assistant

## 2016-10-16 NOTE — Telephone Encounter (Signed)
Pt's mom Rise Paganini (on pt's DPR) is requesting a call back to go over what was done at pt's OV this year b/c she is trying to send information into insurance for reimbursement. Please advise. Thanks TNP

## 2016-10-17 NOTE — Telephone Encounter (Signed)
Advised patient's mom of what was done during OV's 1/29 and 3/23.

## 2017-01-06 ENCOUNTER — Encounter: Payer: Self-pay | Admitting: Physician Assistant

## 2017-01-06 ENCOUNTER — Ambulatory Visit (INDEPENDENT_AMBULATORY_CARE_PROVIDER_SITE_OTHER): Payer: BC Managed Care – PPO | Admitting: Physician Assistant

## 2017-01-06 VITALS — BP 104/60 | HR 65 | Temp 98.3°F | Resp 16 | Wt 120.2 lb

## 2017-01-06 DIAGNOSIS — N898 Other specified noninflammatory disorders of vagina: Secondary | ICD-10-CM | POA: Diagnosis not present

## 2017-01-06 DIAGNOSIS — B373 Candidiasis of vulva and vagina: Secondary | ICD-10-CM

## 2017-01-06 DIAGNOSIS — B3731 Acute candidiasis of vulva and vagina: Secondary | ICD-10-CM

## 2017-01-06 DIAGNOSIS — Z9189 Other specified personal risk factors, not elsewhere classified: Secondary | ICD-10-CM

## 2017-01-06 LAB — POCT URINE PREGNANCY: Preg Test, Ur: NEGATIVE

## 2017-01-06 MED ORDER — FLUCONAZOLE 150 MG PO TABS
150.0000 mg | ORAL_TABLET | Freq: Once | ORAL | 0 refills | Status: AC
Start: 2017-01-06 — End: 2017-01-06

## 2017-01-06 NOTE — Progress Notes (Signed)
Patient: Kelly Lane Female    DOB: Apr 26, 1996   21 y.o.   MRN: [id number] Visit Date: 01/06/2017  Today's Provider: Mar Daring, PA-C   Chief Complaint  Patient presents with  . Vaginal Discharge   Subjective:    Vaginal Discharge  The patient's primary symptoms include pelvic pain and vaginal discharge. This is a recurrent (She has discharge off and on) problem. The current episode started today. The problem has been unchanged. She is not pregnant. Pertinent negatives include no abdominal pain, back pain, chills, constipation, diarrhea, dysuria, fever, nausea, painful intercourse, rash, urgency or vomiting. The vaginal discharge was normal (she reports that when her period was ending last Thursday she had some brown dicharge). Nothing aggravates the symptoms. Treatments tried: lots of fluids. The treatment provided no relief. She is sexually active. It is unknown whether or not her partner has an STD. She uses condoms (sometimes) for contraception. Her past medical history is significant for endometriosis and ovarian cysts.      No Known Allergies   Current Outpatient Prescriptions:  Marland Kitchen  Multiple Vitamin (MULTIVITAMIN) capsule, Take 1 capsule by mouth daily., Disp: , Rfl:   Review of Systems  Constitutional: Negative for chills and fever.  Gastrointestinal: Negative for abdominal pain, constipation, diarrhea, nausea and vomiting.  Genitourinary: Positive for pelvic pain and vaginal discharge. Negative for dysuria and urgency.  Musculoskeletal: Negative for back pain.  Skin: Negative for rash.    Social History  Substance Use Topics  . Smoking status: Never Smoker  . Smokeless tobacco: Never Used  . Alcohol use Yes     Comment: every six months maybe 1 drink   Objective:   BP 104/60 (BP Location: Left Arm, Patient Position: Sitting, Cuff Size: Normal)   Pulse 65   Temp 98.3 F (36.8 C) (Oral)   Resp 16   Wt 120 lb 3.2 oz (54.5 kg)   LMP 12/25/2016   SpO2  98%   BMI 21.29 kg/m  Vitals:   01/06/17 1123  BP: 104/60  Pulse: 65  Resp: 16  Temp: 98.3 F (36.8 C)  TempSrc: Oral  SpO2: 98%  Weight: 120 lb 3.2 oz (54.5 kg)    Physical Exam  Constitutional: She appears well-developed and well-nourished. No distress.  Cardiovascular: Normal rate, regular rhythm and normal heart sounds.  Exam reveals no gallop and no friction rub.   No murmur heard. Pulmonary/Chest: Effort normal and breath sounds normal. No respiratory distress. She has no wheezes. She has no rales.  Abdominal: Soft. Bowel sounds are normal. She exhibits no distension and no mass. There is no tenderness.  Genitourinary: Uterus normal. There is no rash, tenderness, lesion or injury on the right labia. There is no rash, tenderness, lesion or injury on the left labia. Cervix exhibits no motion tenderness, no discharge and no friability. Right adnexum displays no mass, no tenderness and no fullness. Left adnexum displays no mass, no tenderness and no fullness. There is erythema in the vagina. No tenderness in the vagina. Vaginal discharge (thick, white discharge) found.  Lymphadenopathy:       Right: No inguinal adenopathy present.       Left: No inguinal adenopathy present.  Skin: She is not diaphoretic.  Vitals reviewed.     Assessment & Plan:     1. Vaginal discharge, non-hemorrhagic Urine pregnancy negative. Wet prep positive for hyphae and buds. No clue cells noted. Nuswab also collected and will be sent for testing. I  will f/u pending results. Diflucan sent in for vaginal yeast infection. Discussed preventative methods in detail. She is to call if it recurs.  - POCT urine pregnancy - NuSwab Vaginitis Plus (VG+) - fluconazole (DIFLUCAN) 150 MG tablet; Take 1 tablet (150 mg total) by mouth once. May take 2nd tab PO 48-72 hrs after 1st if needed  Dispense: 2 tablet; Refill: 0  2. At risk for sexually transmitted disease due to unprotected sex See above medical treatment  plan. - POCT urine pregnancy - NuSwab Vaginitis Plus (VG+)  3. Vaginal yeast infection See above medical treatment plan. - POCT Wet Prep [facility])       Mar Daring, PA-C  [facility]

## 2017-01-06 NOTE — Patient Instructions (Signed)

## 2017-01-07 LAB — POCT WET PREP (WET MOUNT): Trichomonas Wet Prep HPF POC: ABSENT

## 2017-01-08 ENCOUNTER — Telehealth: Payer: Self-pay

## 2017-01-08 LAB — NUSWAB VAGINITIS PLUS (VG+)
Candida albicans, NAA: NEGATIVE
Candida glabrata, NAA: NEGATIVE
Chlamydia trachomatis, NAA: NEGATIVE
Neisseria gonorrhoeae, NAA: NEGATIVE
Trich vag by NAA: NEGATIVE

## 2017-01-08 NOTE — Telephone Encounter (Signed)
lmtcb

## 2017-01-08 NOTE — Telephone Encounter (Signed)
-----   Message from Mar Daring, PA-C sent at 01/08/2017  8:33 AM EDT ----- Nu swab negative for BV, GC/clamydia and trich

## 2017-01-08 NOTE — Telephone Encounter (Signed)
Patient advised as below.  

## 2017-03-03 ENCOUNTER — Encounter: Payer: Self-pay | Admitting: Physician Assistant

## 2017-03-03 ENCOUNTER — Ambulatory Visit (INDEPENDENT_AMBULATORY_CARE_PROVIDER_SITE_OTHER): Payer: BC Managed Care – PPO | Admitting: Physician Assistant

## 2017-03-03 VITALS — BP 106/64 | HR 88 | Temp 98.2°F | Resp 16 | Wt 120.0 lb

## 2017-03-03 DIAGNOSIS — B373 Candidiasis of vulva and vagina: Secondary | ICD-10-CM | POA: Diagnosis not present

## 2017-03-03 DIAGNOSIS — R209 Unspecified disturbances of skin sensation: Secondary | ICD-10-CM | POA: Diagnosis not present

## 2017-03-03 DIAGNOSIS — Z130 Encounter for screening for diseases of the blood and blood-forming organs and certain disorders involving the immune mechanism: Secondary | ICD-10-CM

## 2017-03-03 DIAGNOSIS — R630 Anorexia: Secondary | ICD-10-CM | POA: Diagnosis not present

## 2017-03-03 DIAGNOSIS — B3731 Acute candidiasis of vulva and vagina: Secondary | ICD-10-CM

## 2017-03-03 DIAGNOSIS — Z7251 High risk heterosexual behavior: Secondary | ICD-10-CM

## 2017-03-03 NOTE — Patient Instructions (Signed)

## 2017-03-03 NOTE — Progress Notes (Signed)
Patient: Kelly Lane Female    DOB: 1995/12/21   21 y.o.   MRN: [id number] Visit Date: 03/03/2017  Today's Provider: Trinna Post, PA-C   Chief Complaint  Patient presents with  . Exposure to STD  . Endometriosis   Subjective:    Exposure to STD   The patient's primary symptoms include pelvic pain ("Discomfort"). The patient's pertinent negatives include no discharge, dyspareunia, dysuria, genital itching, genital lesions or genital rash. This is a new problem. The current episode started 1 to 4 weeks ago. The vaginal discharge was normal. Pertinent negatives include no urinary frequency.   Wants to be tested for "all" STIs. G/C, trich. Wants blood test for HIV, RPR, HSV.  Says she wants to be tested for anemia because her feet are cold. No numbness, tingling. No rapid heart beat, dizziness, passing out, or heavy periods. No history of anemia.   Also concerned about her appetite. Says she has lost weight and wants her appetite back.      No Known Allergies   Current Outpatient Prescriptions:  Marland Kitchen  Multiple Vitamin (MULTIVITAMIN) capsule, Take 1 capsule by mouth daily., Disp: , Rfl:   Review of Systems  Constitutional: Negative.   Genitourinary: Positive for pelvic pain ("Discomfort"). Negative for decreased urine volume, difficulty urinating, dyspareunia, dysuria, enuresis, flank pain, frequency, genital sores, hematuria, menstrual problem, urgency, vaginal bleeding, vaginal discharge and vaginal pain.  Neurological: Negative for dizziness, light-headedness and headaches.    Social History  Substance Use Topics  . Smoking status: Never Smoker  . Smokeless tobacco: Never Used  . Alcohol use Yes     Comment: every six months maybe 1 drink   Objective:   BP 106/64 (BP Location: Left Arm, Patient Position: Sitting, Cuff Size: Normal)   Pulse 88   Temp 98.2 F (36.8 C) (Oral)   Resp 16   Wt 120 lb (54.4 kg)   LMP 02/25/2017   BMI 21.26 kg/m  Vitals:   03/03/17 1114  BP: 106/64  Pulse: 88  Resp: 16  Temp: 98.2 F (36.8 C)  TempSrc: Oral  Weight: 120 lb (54.4 kg)     Physical Exam  Constitutional: She is oriented to person, place, and time. She appears well-developed and well-nourished.  Cardiovascular: Normal rate.   Pulses:      Dorsalis pedis pulses are 2+ on the right side, and 2+ on the left side.  Feet are warm and well perfused.   Pulmonary/Chest: Effort normal.  Genitourinary: Uterus normal. There is no rash, tenderness, lesion or injury on the right labia. There is no rash, tenderness, lesion or injury on the left labia. Uterus is not deviated, not enlarged, not fixed and not tender. Cervix exhibits no motion tenderness, no discharge and no friability. Right adnexum displays no mass, no tenderness and no fullness. Left adnexum displays no mass, no tenderness and no fullness. No erythema, tenderness or bleeding in the vagina. No foreign body in the vagina. No signs of injury around the vagina. Vaginal discharge found.  Genitourinary Comments: Some dark brown mucoid discharge consistent with end of menstrual cycle.   Musculoskeletal: She exhibits no edema.  Neurological: She is alert and oriented to person, place, and time. No sensory deficit.  Skin: Skin is warm and dry.  Psychiatric: She has a normal mood and affect. Her behavior is normal.        Assessment & Plan:     1. 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 Patient still wishes to proceed.  - SureSwab, Vaginosis/Vaginitis Plus - HIV antibody (with reflex) - RPR - HSV(herpes simplex vrs) 1+2 ab-IgG  2. Bilateral cold feet  Do not think this is related to anemia, but will check.  - CBC with Differential  3. Screening for deficiency anemia  - CBC with Differential  4. Decreased appetite  Weight stable. Actually increased from 113 lbs.   Return if symptoms worsen or fail to  improve.  The entirety of the information documented in the History of Present Illness, Review of Systems and Physical Exam were personally obtained by me. Portions of this information were initially documented by Ashley Royalty, CMA and reviewed by me for thoroughness and accuracy.        Trinna Post, PA-C  [facility]

## 2017-03-04 LAB — RPR: RPR Ser Ql: NONREACTIVE

## 2017-03-04 LAB — CBC WITH DIFFERENTIAL/PLATELET
Basophils Absolute: 21 cells/uL (ref 0–200)
Basophils Relative: 0.5 %
Eosinophils Absolute: 29 cells/uL (ref 15–500)
Eosinophils Relative: 0.7 %
HCT: 38.8 % (ref 35.0–45.0)
Hemoglobin: 13.9 g/dL (ref 11.7–15.5)
Lymphs Abs: 1411 cells/uL (ref 850–3900)
MCH: 33.3 pg — ABNORMAL HIGH (ref 27.0–33.0)
MCHC: 35.8 g/dL (ref 32.0–36.0)
MCV: 93 fL (ref 80.0–100.0)
MPV: 11.1 fL (ref 7.5–12.5)
Monocytes Relative: 9 %
Neutro Abs: 2360 cells/uL (ref 1500–7800)
Neutrophils Relative %: 56.2 %
Platelets: 272 10*3/uL (ref 140–400)
RBC: 4.17 10*6/uL (ref 3.80–5.10)
RDW: 11.7 % (ref 11.0–15.0)
Total Lymphocyte: 33.6 %
WBC mixed population: 378 cells/uL (ref 200–950)
WBC: 4.2 10*3/uL (ref 3.8–10.8)

## 2017-03-04 LAB — HSV(HERPES SIMPLEX VRS) I + II AB-IGG
HAV 1 IGG,TYPE SPECIFIC AB: <0.9 index
HSV 2 IGG,TYPE SPECIFIC AB: <0.9 index

## 2017-03-04 LAB — HIV ANTIBODY (ROUTINE TESTING W REFLEX): HIV 1&2 Ab, 4th Generation: NONREACTIVE

## 2017-03-05 ENCOUNTER — Telehealth: Payer: Self-pay | Admitting: Physician Assistant

## 2017-03-05 NOTE — Telephone Encounter (Signed)
Pt calling for lab results.

## 2017-03-06 LAB — SURESWAB, VAGINOSIS/VAGINITIS PLUS
Atopobium vaginae: 6.1 Log (cells/mL)
BV CATEGORY: UNDETERMINED — AB
C. albicans, DNA: NOT DETECTED
C. glabrata, DNA: NOT DETECTED
C. parapsilosis, DNA: DETECTED — AB
C. trachomatis RNA, TMA: NOT DETECTED
C. tropicalis, DNA: NOT DETECTED
Gardnerella vaginalis: 7.3 Log (cells/mL)
LACTOBACILLUS SPECIES: 6.1 Log (cells/mL)
MEGASPHAERA SPECIES: NOT DETECTED Log (cells/mL)
N. gonorrhoeae RNA, TMA: NOT DETECTED
Trichomonas vaginalis RNA: NOT DETECTED

## 2017-03-10 MED ORDER — FLUCONAZOLE 150 MG PO TABS: ORAL_TABLET | ORAL | 0 refills | Status: DC

## 2017-03-10 NOTE — Addendum Note (Signed)
Addended by: Trinna Post on: 03/10/2017 10:55 AM   Modules accepted: Orders

## 2017-04-01 ENCOUNTER — Telehealth: Payer: Self-pay | Admitting: Physician Assistant

## 2017-04-01 NOTE — Telephone Encounter (Signed)
Pt states she was seen on 03/03/17 for a discharge.  Pt states she is still having the discharge and having spotting.  Pt states she rec'd a Rx but it did not help.  Pt is asking if she can get something different.  [facility].  YB#[phone]

## 2017-04-01 NOTE — Telephone Encounter (Signed)
She will need to make office visit for re-evaluation.

## 2017-04-01 NOTE — Telephone Encounter (Signed)
Apt made for 04/02/2017   Thanks,   -Mickel Baas

## 2017-04-02 ENCOUNTER — Encounter: Payer: Self-pay | Admitting: Physician Assistant

## 2017-04-02 ENCOUNTER — Ambulatory Visit (INDEPENDENT_AMBULATORY_CARE_PROVIDER_SITE_OTHER): Payer: BC Managed Care – PPO | Admitting: Physician Assistant

## 2017-04-02 VITALS — BP 118/72 | HR 68 | Temp 98.2°F | Resp 16 | Wt 122.0 lb

## 2017-04-02 DIAGNOSIS — B9689 Other specified bacterial agents as the cause of diseases classified elsewhere: Secondary | ICD-10-CM

## 2017-04-02 DIAGNOSIS — N760 Acute vaginitis: Secondary | ICD-10-CM

## 2017-04-02 MED ORDER — METRONIDAZOLE 0.75 % VA GEL: 1.0000 | VAGINAL | 0 refills | Status: DC

## 2017-04-02 MED ORDER — CLINDAMYCIN HCL 300 MG PO CAPS: 300.0000 mg | ORAL_CAPSULE | Freq: Two times a day (BID) | ORAL | 0 refills | Status: DC

## 2017-04-02 NOTE — Progress Notes (Signed)
Patient: Kelly Lane Female    DOB: [birth year]   21 y.o.   MRN: [id number] Visit Date: 04/02/2017  Today's Provider: Trinna Post, PA-C   Chief Complaint  Patient presents with  . Vaginal Discharge   Subjective:    Kelly Lane is a 21 y/o woman presenting today for vaginal discharge ongoing for one month. Was treated one month ago for yeast infection. She says her symptoms has not improved. She describes discharge as white and malodorous. Denies unprotected sexual encounter. Denies pelvic pain, vaginal bleeding, fevers, chills, nausea, vomiting. Has had BV in the past. Denies douching, but says she sits in a tub of water after her period and "takes water in." She took probiotics for three weeks and says they didn't work.  Vaginal Discharge  The patient's primary symptoms include vaginal discharge. The patient's pertinent negatives include no genital itching, genital lesions, genital odor, genital rash, missed menses, pelvic pain or vaginal bleeding. This is a recurrent problem. Pertinent negatives include no chills, dysuria, fever, flank pain, frequency, hematuria or urgency.       No Known Allergies   Current Outpatient Medications:  Marland Kitchen  Multiple Vitamin (MULTIVITAMIN) capsule, Take 1 capsule by mouth daily., Disp: , Rfl:  .  fluconazole (DIFLUCAN) 150 MG tablet, Take one tablet on day 1. Take second tablet 3 days later if symptoms persist., Disp: 2 tablet, Rfl: 0  Review of Systems  Constitutional: Negative for activity change, appetite change, chills, diaphoresis, fatigue, fever and unexpected weight change.  Genitourinary: Positive for vaginal discharge. Negative for decreased urine volume, difficulty urinating, dyspareunia, dysuria, enuresis, flank pain, frequency, genital sores, hematuria, menstrual problem, missed menses, pelvic pain, urgency, vaginal bleeding and vaginal pain.    Social History   Tobacco Use  . Smoking status: Never Smoker  . Smokeless tobacco:  Never Used  Substance Use Topics  . Alcohol use: Yes    Comment: every six months maybe 1 drink   Objective:   BP 118/72 (BP Location: Left Arm, Patient Position: Sitting, Cuff Size: Normal)   Pulse 68   Temp 98.2 F (36.8 C) (Oral)   Resp 16   Wt 122 lb (55.3 kg)   LMP 03/23/2017   BMI 21.61 kg/m  Vitals:   04/02/17 1608  BP: 118/72  Pulse: 68  Resp: 16  Temp: 98.2 F (36.8 C)  TempSrc: Oral  Weight: 122 lb (55.3 kg)     Physical Exam  Constitutional: She is oriented to person, place, and time. She appears well-developed and well-nourished.  Genitourinary: No labial fusion. There is no rash, tenderness, lesion or injury on the right labia. There is no rash, tenderness, lesion or injury on the left labia. No erythema, tenderness or bleeding in the vagina. No foreign body in the vagina. No signs of injury around the vagina. Vaginal discharge found.  Neurological: She is alert and oriented to person, place, and time.  Skin: Skin is warm and dry.  Psychiatric: She has a normal mood and affect. Her behavior is normal.        Assessment & Plan:     1. Bacterial vaginosis  Explained recurrent nature of BV. Encouraged longer use of probiotics. Advised against douching. She declines metrogel as treatment, requests clindamycin. Counseled on risks. After clindamycin, may do two vaginal applications of metrogel per week x 4 months. Have referred to gynecology before but she did not go. Will re-refer as she wishes to be seen.  - clindamycin (  CLEOCIN) 300 MG capsule; Take 1 capsule (300 mg total) by mouth 2 (two) times daily for 7 days.  Dispense: 14 capsule; Refill: 0 - metroNIDAZOLE (METROGEL) 0.75 % vaginal gel; Place 1 Applicatorful vaginally 2 (two) times a week. Start treatment after clindamycin. Continue for four months.  Dispense: 70 g; Refill: 0 - Ambulatory referral to Obstetrics / Gynecology  Return if symptoms worsen or fail to improve.  The entirety of the information  documented in the History of Present Illness, Review of Systems and Physical Exam were personally obtained by me. Portions of this information were initially documented by Ashley Royalty, CMA and reviewed by me for thoroughness and accuracy.        Trinna Post, PA-C  [facility]

## 2017-04-02 NOTE — Patient Instructions (Signed)

## 2017-04-08 ENCOUNTER — Telehealth: Payer: Self-pay | Admitting: Obstetrics & Gynecology

## 2017-04-08 NOTE — Telephone Encounter (Signed)
[facility] referring for Bacterial vaginosis. Called and spoke with patient. Patient wishes to call back to be schedule

## 2017-04-09 ENCOUNTER — Other Ambulatory Visit: Payer: Self-pay | Admitting: Physician Assistant

## 2017-04-09 DIAGNOSIS — B9689 Other specified bacterial agents as the cause of diseases classified elsewhere: Secondary | ICD-10-CM

## 2017-04-09 DIAGNOSIS — N760 Acute vaginitis: Principal | ICD-10-CM

## 2017-04-09 MED ORDER — CLINDAMYCIN HCL 300 MG PO CAPS: ORAL_CAPSULE | ORAL | 0 refills | Status: DC

## 2017-04-16 NOTE — Telephone Encounter (Signed)
Called and left voicemail for patient to call back to schedule °

## 2017-04-17 NOTE — Telephone Encounter (Signed)
Called and left voice mail for patient to call back to be schedule °

## 2017-05-12 ENCOUNTER — Telehealth: Payer: Self-pay | Admitting: Physician Assistant

## 2017-05-15 ENCOUNTER — Telehealth: Payer: Self-pay | Admitting: Physician Assistant

## 2017-05-15 NOTE — Telephone Encounter (Signed)
FYI--Pt was referred to [facility] Nov 2018 but appointment has not been made.[facility] for bacterial vaginosis has made several attempts to contact pt.I also spoke with pt 05/12/17 to give her their contact information

## 2017-05-25 ENCOUNTER — Encounter: Payer: Self-pay | Admitting: Physician Assistant

## 2017-05-25 ENCOUNTER — Ambulatory Visit (INDEPENDENT_AMBULATORY_CARE_PROVIDER_SITE_OTHER): Payer: BC Managed Care – PPO | Admitting: Physician Assistant

## 2017-05-25 VITALS — BP 112/70 | HR 82 | Temp 98.2°F | Resp 16 | Wt 119.0 lb

## 2017-05-25 DIAGNOSIS — G4700 Insomnia, unspecified: Secondary | ICD-10-CM

## 2017-05-25 DIAGNOSIS — F419 Anxiety disorder, unspecified: Secondary | ICD-10-CM

## 2017-05-25 MED ORDER — HYDROXYZINE PAMOATE 25 MG PO CAPS: 25.0000 mg | ORAL_CAPSULE | Freq: Every day | ORAL | 0 refills | Status: DC | PRN
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
## 2017-05-25 NOTE — Patient Instructions (Signed)

## 2017-05-26 ENCOUNTER — Ambulatory Visit: Payer: BC Managed Care – PPO | Admitting: Physician Assistant

## 2017-06-21 ENCOUNTER — Emergency Department: Payer: BC Managed Care – PPO

## 2017-06-21 ENCOUNTER — Other Ambulatory Visit: Payer: Self-pay

## 2017-06-21 ENCOUNTER — Encounter: Payer: Self-pay | Admitting: Emergency Medicine

## 2017-06-21 ENCOUNTER — Other Ambulatory Visit: Payer: Self-pay | Admitting: Physician Assistant

## 2017-06-21 ENCOUNTER — Emergency Department
Admission: EM | Admit: 2017-06-21 | Discharge: 2017-06-21 | Discharge status: Against Medical Advice | Payer: BC Managed Care – PPO | Attending: Emergency Medicine | Admitting: Emergency Medicine

## 2017-06-21 DIAGNOSIS — S301XXA Contusion of abdominal wall, initial encounter: Secondary | ICD-10-CM

## 2017-06-21 DIAGNOSIS — S0003XA Contusion of scalp, initial encounter: Secondary | ICD-10-CM | POA: Diagnosis not present

## 2017-06-21 DIAGNOSIS — Y999 Unspecified external cause status: Secondary | ICD-10-CM | POA: Diagnosis not present

## 2017-06-21 DIAGNOSIS — S40021A Contusion of right upper arm, initial encounter: Secondary | ICD-10-CM

## 2017-06-21 DIAGNOSIS — Z79899 Other long term (current) drug therapy: Secondary | ICD-10-CM | POA: Insufficient documentation

## 2017-06-21 DIAGNOSIS — S060X0A Concussion without loss of consciousness, initial encounter: Secondary | ICD-10-CM | POA: Insufficient documentation

## 2017-06-21 DIAGNOSIS — Y929 Unspecified place or not applicable: Secondary | ICD-10-CM | POA: Diagnosis not present

## 2017-06-21 DIAGNOSIS — S0990XA Unspecified injury of head, initial encounter: Secondary | ICD-10-CM | POA: Diagnosis present

## 2017-06-21 DIAGNOSIS — Y939 Activity, unspecified: Secondary | ICD-10-CM | POA: Insufficient documentation

## 2017-06-21 DIAGNOSIS — G4700 Insomnia, unspecified: Secondary | ICD-10-CM

## 2017-06-21 LAB — POCT PREGNANCY, URINE: Preg Test, Ur: NEGATIVE

## 2017-06-21 NOTE — ED Notes (Signed)
To CT via stretcher
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
## 2017-07-01 ENCOUNTER — Other Ambulatory Visit: Payer: Self-pay | Admitting: Physician Assistant

## 2017-07-01 ENCOUNTER — Telehealth: Payer: Self-pay | Admitting: Physician Assistant

## 2017-07-01 DIAGNOSIS — G4700 Insomnia, unspecified: Secondary | ICD-10-CM

## 2017-07-01 NOTE — Telephone Encounter (Signed)
Open in Error.

## 2017-07-01 NOTE — Telephone Encounter (Signed)
[facility] faxed a refill request for the following medication. Thanks CC  hydrOXYzine (VISTARIL) 25 MG capsule

## 2017-07-28 ENCOUNTER — Ambulatory Visit (INDEPENDENT_AMBULATORY_CARE_PROVIDER_SITE_OTHER): Payer: BC Managed Care – PPO | Admitting: Physician Assistant

## 2017-07-28 ENCOUNTER — Encounter: Payer: Self-pay | Admitting: Physician Assistant

## 2017-07-28 VITALS — BP 100/60 | HR 71 | Temp 97.9°F | Resp 16 | Wt 120.0 lb

## 2017-07-28 DIAGNOSIS — Z113 Encounter for screening for infections with a predominantly sexual mode of transmission: Secondary | ICD-10-CM

## 2017-07-28 DIAGNOSIS — Z7251 High risk heterosexual behavior: Secondary | ICD-10-CM | POA: Diagnosis not present

## 2017-07-28 LAB — POCT URINE PREGNANCY: Preg Test, Ur: NEGATIVE

## 2017-07-28 NOTE — Progress Notes (Signed)
Patient: Kelly Lane Female    DOB: 06-Nov-1995   21 y.o.   MRN: [id number] Visit Date: 07/28/2017  Today's Provider: Mar Daring, PA-C   Chief Complaint  Patient presents with  . Exposure to STD   Subjective:    HPI Sexually Transmitted Disease Check: Patient presents for sexually transmitted disease check. Sexual history reviewed with the patient. STD exposure: denies any exposure that she knows off, "just want to be on the safe side".  Previous history of STD:  none. Current symptoms include vaginal discharge: brown.  Contraception: none. LMP:06/30/17    No Known Allergies   Current Outpatient Medications:  .  clindamycin (CLEOCIN) 300 MG capsule, Take one final pill to complete twice daily dosing. (Patient not taking: Reported on 07/28/2017), Disp: 1 capsule, Rfl: 0 .  fluconazole (DIFLUCAN) 150 MG tablet, Take one tablet on day 1. Take second tablet 3 days later if symptoms persist. (Patient not taking: Reported on 07/28/2017), Disp: 2 tablet, Rfl: 0 .  hydrOXYzine (VISTARIL) 25 MG capsule, Take 1 capsule (25 mg total) by mouth daily as needed. (Patient not taking: Reported on 07/28/2017), Disp: 30 capsule, Rfl: 0 .  metroNIDAZOLE (METROGEL) 0.75 % vaginal gel, Place 1 Applicatorful vaginally 2 (two) times a week. Start treatment after clindamycin. Continue for four months. (Patient not taking: Reported on 07/28/2017), Disp: 70 g, Rfl: 0 .  Multiple Vitamin (MULTIVITAMIN) capsule, Take 1 capsule by mouth daily., Disp: , Rfl:   Review of Systems  Constitutional: Negative.   Respiratory: Negative.   Cardiovascular: Negative.   Gastrointestinal: Negative.   Genitourinary: Positive for vaginal discharge. Negative for genital sores, vaginal bleeding and vaginal pain.    Social History   Tobacco Use  . Smoking status: Never Smoker  . Smokeless tobacco: Never Used  Substance Use Topics  . Alcohol use: Yes    Comment: every six months maybe 1 drink   Objective:   BP 100/60 (BP Location: Left Arm, Patient Position: Sitting, Cuff Size: Normal)   Pulse 71   Temp 97.9 F (36.6 C) (Oral)   Resp 16   Wt 120 lb (54.4 kg)   LMP 06/30/2017   SpO2 99%   BMI 21.95 kg/m    Physical Exam  Constitutional: She appears well-developed and well-nourished. No distress.  Neck: Normal range of motion. Neck supple.  Cardiovascular: Normal rate, regular rhythm and normal heart sounds. Exam reveals no gallop and no friction rub.  No murmur heard. Pulmonary/Chest: Effort normal and breath sounds normal. No respiratory distress. She has no wheezes. She has no rales.  Abdominal: Soft. Bowel sounds are normal. She exhibits no distension. There is no tenderness.  Genitourinary: Cervix exhibits discharge. Cervix exhibits no motion tenderness and no friability. Right adnexum displays no mass, no tenderness and no fullness. Left adnexum displays no mass, no tenderness and no fullness. There is bleeding (brownish red discharge c/w early menstruation noted) in the vagina. No erythema or tenderness in the vagina. No foreign body in the vagina. No signs of injury around the vagina. No vaginal discharge found.  Skin: She is not diaphoretic.  Vitals reviewed.       Assessment & Plan:     1. Screen for STD (sexually transmitted disease) Nu swab collected for STD testing. HIV ordered at patient request. Discussed safe sex practices again. Urine pregnancy negative. Will follow up pending results.  - NuSwab Vaginitis Plus (VG+) - HIV antibody (with reflex)  2. High risk heterosexual  behavior See above medical treatment plan. - NuSwab Vaginitis Plus (VG+) - POCT urine pregnancy - HIV antibody (with reflex)       Mar Daring, PA-C  [place]

## 2017-07-28 NOTE — Patient Instructions (Signed)

## 2017-07-29 ENCOUNTER — Telehealth: Payer: Self-pay

## 2017-07-29 LAB — HIV ANTIBODY (ROUTINE TESTING W REFLEX): HIV Screen 4th Generation wRfx: NONREACTIVE

## 2017-07-29 NOTE — Telephone Encounter (Signed)
Patient advised as directed below.  Thanks,  -Josilynn Losh 

## 2017-07-29 NOTE — Telephone Encounter (Signed)
-----   Message from Mar Daring, PA-C sent at 07/29/2017  8:46 AM EDT ----- HIV negative.

## 2017-07-31 LAB — NUSWAB VAGINITIS PLUS (VG+)
Candida albicans, NAA: NEGATIVE
Candida glabrata, NAA: NEGATIVE
Chlamydia trachomatis, NAA: NEGATIVE
Neisseria gonorrhoeae, NAA: NEGATIVE
Trich vag by NAA: NEGATIVE

## 2017-08-03 ENCOUNTER — Telehealth: Payer: Self-pay

## 2017-08-03 NOTE — Telephone Encounter (Signed)
Patient advised as below.  

## 2017-08-03 NOTE — Telephone Encounter (Signed)
-----   Message from Mar Daring, PA-C sent at 07/31/2017  4:53 PM EDT ----- NuSwab completely normal.

## 2017-08-19 ENCOUNTER — Telehealth: Payer: Self-pay | Admitting: Physician Assistant

## 2017-08-19 DIAGNOSIS — N926 Irregular menstruation, unspecified: Secondary | ICD-10-CM

## 2017-08-19 NOTE — Telephone Encounter (Signed)
Patient advised as directed below. Patient state "No, this is not normal" offered a referral to GYN and patient said yes and wants it at [facility].

## 2017-08-19 NOTE — Telephone Encounter (Signed)
Please review. Thanks!  

## 2017-08-19 NOTE — Telephone Encounter (Signed)
This is completely normal the week before and even the week after a menstrual cycle.

## 2017-08-19 NOTE — Telephone Encounter (Signed)
Referral placed.

## 2017-08-19 NOTE — Telephone Encounter (Signed)
Patient states that she is having spotting way before her period should start.  She has not changed anything and is not on birth control.  She states that this is not normal for her but has happened before and you told her that this is normal for her body to change.

## 2017-08-25 ENCOUNTER — Telehealth: Payer: Self-pay | Admitting: Obstetrics and Gynecology

## 2017-08-25 ENCOUNTER — Telehealth: Payer: Self-pay | Admitting: Physician Assistant

## 2017-08-25 NOTE — Telephone Encounter (Signed)
error 

## 2017-08-25 NOTE — Telephone Encounter (Signed)
[facility] referring for Abnormal menses. Called and left voicemail for patient to call back to be schedule

## 2017-08-26 ENCOUNTER — Ambulatory Visit: Payer: BC Managed Care – PPO | Admitting: Obstetrics and Gynecology

## 2017-08-26 ENCOUNTER — Encounter: Payer: Self-pay | Admitting: Obstetrics and Gynecology

## 2017-08-26 VITALS — BP 100/60 | HR 67 | Ht 62.0 in | Wt 119.0 lb

## 2017-08-26 DIAGNOSIS — N938 Other specified abnormal uterine and vaginal bleeding: Secondary | ICD-10-CM

## 2017-08-26 DIAGNOSIS — Z113 Encounter for screening for infections with a predominantly sexual mode of transmission: Secondary | ICD-10-CM

## 2017-08-26 DIAGNOSIS — N898 Other specified noninflammatory disorders of vagina: Secondary | ICD-10-CM

## 2017-08-26 DIAGNOSIS — N809 Endometriosis, unspecified: Secondary | ICD-10-CM

## 2017-08-26 LAB — POCT URINE PREGNANCY: Preg Test, Ur: NEGATIVE

## 2017-08-26 NOTE — Progress Notes (Signed)
Mar Daring, PA-C   Chief Complaint  Patient presents with  . Metrorrhagia    pt has been spotting since  4/10,     HPI:      Ms. Kelly Lane is a 22 y.o. [id number] who LMP was Patient's last menstrual period was 08/12/2017., presents today for eval of DUB this cycle, referred by PCP. Menses usually monthly, about the 25th of every month, last 5-6 days, light to med flow. Has mild dysmen, meds not usually needed. Never had BTB before. Started spotting 08/12/17 that has continued to present. Having cramps that are mild like regular menses. Also states she had a couple days of spotting before her 3/19 menses started. Unusual for pt and she is concerned.   Pt is sex active, no BC/condoms. Had neg STD test 3/19 with PCP. No new partners since. She would like STD testing again today. She also noticed fishy smelling d/c a couple wks ago that has resolved, and would like a repeat vaginitis panel. Had neg BV and yeast testing with PCP 3/19, too. Hx of BV in past.  Hx of endometriosis, diagnosed by dx lap. Pt wonders about being able to conceive, although not ready to get pregnant currently.    Past Medical History:  Diagnosis Date  . Endometriosis 07/2015  . GERD (gastroesophageal reflux disease)    NO MEDS  . Headache   . Ovarian cyst 07/2015    Past Surgical History:  Procedure Laterality Date  . LAPAROSCOPIC OVARIAN CYSTECTOMY Right 07/17/2015   Procedure: EXCISION OF ENDOMETRIOSIS;  Surgeon: Gae Dry, MD;  Location: [facility];  Service: Gynecology;  Laterality: Right;  . LAPAROSCOPY N/A 07/17/2015   Procedure: LAPAROSCOPY OPERATIVE;  Surgeon: Gae Dry, MD;  Location: [facility];  Service: Gynecology;  Laterality: N/A;  . NO PAST SURGERIES      Family History  Problem Relation Age of Onset  . Hypertension Mother     Social History   Socioeconomic History  . Marital status: Unknown    Spouse name: Not on file  . Number of children: Not on file  . Years of  education: Not on file  . Highest education level: Not on file  Occupational History  . Not on file  Social Needs  . Financial resource strain: Not on file  . Food insecurity:    Worry: Not on file    Inability: Not on file  . Transportation needs:    Medical: Not on file    Non-medical: Not on file  Tobacco Use  . Smoking status: Never Smoker  . Smokeless tobacco: Never Used  Substance and Sexual Activity  . Alcohol use: Yes    Comment: every six months maybe 1 drink  . Drug use: No  . Sexual activity: Yes    Birth control/protection: Condom  Lifestyle  . Physical activity:    Days per week: Not on file    Minutes per session: Not on file  . Stress: Not on file  Relationships  . Social connections:    Talks on phone: Not on file    Gets together: Not on file    Attends religious service: Not on file    Active member of club or organization: Not on file    Attends meetings of clubs or organizations: Not on file    Relationship status: Not on file  . Intimate partner violence:    Fear of current or ex partner: Not on file  Emotionally abused: Not on file    Physically abused: Not on file    Forced sexual activity: Not on file  Other Topics Concern  . Not on file  Social History Narrative  . Not on file    Outpatient Medications Prior to Visit  Medication Sig Dispense Refill  . clindamycin (CLEOCIN) 300 MG capsule Take one final pill to complete twice daily dosing. (Patient not taking: Reported on 07/28/2017) 1 capsule 0  . fluconazole (DIFLUCAN) 150 MG tablet Take one tablet on day 1. Take second tablet 3 days later if symptoms persist. (Patient not taking: Reported on 07/28/2017) 2 tablet 0  . hydrOXYzine (VISTARIL) 25 MG capsule Take 1 capsule (25 mg total) by mouth daily as needed. (Patient not taking: Reported on 07/28/2017) 30 capsule 0  . metroNIDAZOLE (METROGEL) 0.75 % vaginal gel Place 1 Applicatorful vaginally 2 (two) times a week. Start treatment after  clindamycin. Continue for four months. (Patient not taking: Reported on 07/28/2017) 70 g 0  . Multiple Vitamin (MULTIVITAMIN) capsule Take 1 capsule by mouth daily.     No facility-administered medications prior to visit.     ROS:  Review of Systems  Constitutional: Negative for fever.  Gastrointestinal: Negative for blood in stool, constipation, diarrhea, nausea and vomiting.  Genitourinary: Positive for menstrual problem. Negative for dyspareunia, dysuria, flank pain, frequency, hematuria, urgency, vaginal bleeding, vaginal discharge and vaginal pain.  Musculoskeletal: Negative for back pain.  Skin: Negative for rash.    OBJECTIVE:   Vitals:  BP 100/60   Pulse 67   Ht 5\' 2"  (1.575 m)   Wt 119 lb (54 kg)   LMP 08/12/2017   BMI 21.77 kg/m   Physical Exam  Constitutional: She is oriented to person, place, and time. Vital signs are normal. She appears well-developed.  Pulmonary/Chest: Effort normal.  Genitourinary: Uterus normal. There is no rash, tenderness or lesion on the right labia. There is no rash, tenderness or lesion on the left labia. Uterus is not enlarged and not tender. Cervix exhibits no motion tenderness. Right adnexum displays no mass and no tenderness. Left adnexum displays no mass and no tenderness. There is bleeding in the vagina. No erythema or tenderness in the vagina. No vaginal discharge found.  Musculoskeletal: Normal range of motion.  Neurological: She is alert and oriented to person, place, and time.  Psychiatric: She has a normal mood and affect. Her behavior is normal. Thought content normal.  Vitals reviewed.   Results: Results for orders placed or performed in visit on 08/26/17 (from the past 24 hour(s))  POCT urine pregnancy     Status: Normal   Collection Time: 08/26/17 11:16 AM  Result Value Ref Range   Preg Test, Ur Negative Negative     Assessment/Plan: DUB (dysfunctional uterine bleeding) - Neg UPT. Rechk nuswab (pt req). Check labs.  Will f/u with results. Most likely anovulatory bleeding, but pt concerned and wants full eval.  If neg, will chk u/s - Plan: POCT urine pregnancy, TSH + free T4, Prolactin  Screening for STD (sexually transmitted disease) - Plan: NuSwab Vaginitis Plus (VG+)  Vaginal odor - No current sx. Nuswab vaginitis test today per pt request. Will f/u wiht results.  - Plan: NuSwab Vaginitis Plus (VG+)  Endometriosis - No significant sx.     Return if symptoms worsen or fail to improve.  Leighann Amadon B. Aylla Huffine, PA-C 08/26/2017 2:37 PM

## 2017-08-26 NOTE — Patient Instructions (Signed)
I value your feedback and entrusting us with your care. If you get a [facility] patient survey, I would appreciate you taking the time to let us know about your experience today. Thank you! 

## 2017-08-27 LAB — TSH+FREE T4
Free T4: 1.26 ng/dL (ref 0.82–1.77)
TSH: 1.24 u[IU]/mL (ref 0.450–4.500)

## 2017-08-27 LAB — PROLACTIN: Prolactin: 16.6 ng/mL (ref 4.8–23.3)

## 2017-08-29 LAB — NUSWAB VAGINITIS PLUS (VG+)
Atopobium vaginae: HIGH Score — AB
BVAB 2: HIGH Score — AB
Candida albicans, NAA: NEGATIVE
Candida glabrata, NAA: NEGATIVE
Chlamydia trachomatis, NAA: NEGATIVE
Neisseria gonorrhoeae, NAA: NEGATIVE
Trich vag by NAA: NEGATIVE

## 2017-08-31 ENCOUNTER — Other Ambulatory Visit: Payer: Self-pay | Admitting: Obstetrics and Gynecology

## 2017-08-31 DIAGNOSIS — B9689 Other specified bacterial agents as the cause of diseases classified elsewhere: Secondary | ICD-10-CM

## 2017-08-31 DIAGNOSIS — N760 Acute vaginitis: Principal | ICD-10-CM

## 2017-08-31 MED ORDER — CLINDAMYCIN HCL 300 MG PO CAPS: 300.0000 mg | ORAL_CAPSULE | Freq: Two times a day (BID) | ORAL | 0 refills | Status: AC

## 2017-08-31 NOTE — Progress Notes (Signed)
Rx RF for BV. Pos nuswab VG panel. Add probiotics/use condoms. Has had BV about 3 times since 12/18.

## 2017-12-21 ENCOUNTER — Ambulatory Visit (INDEPENDENT_AMBULATORY_CARE_PROVIDER_SITE_OTHER): Payer: BC Managed Care – PPO | Admitting: Physician Assistant

## 2017-12-21 ENCOUNTER — Encounter: Payer: Self-pay | Admitting: Physician Assistant

## 2017-12-21 VITALS — BP 110/80 | HR 94 | Temp 98.8°F | Resp 16 | Wt 122.6 lb

## 2017-12-21 DIAGNOSIS — N926 Irregular menstruation, unspecified: Secondary | ICD-10-CM

## 2017-12-21 DIAGNOSIS — Z3201 Encounter for pregnancy test, result positive: Secondary | ICD-10-CM | POA: Diagnosis not present

## 2017-12-21 LAB — POCT URINE PREGNANCY: Preg Test, Ur: POSITIVE — AB

## 2017-12-21 NOTE — Progress Notes (Signed)
       Patient: Kelly Lane Female    DOB: May 29, 1995   22 y.o.   MRN: [id number] Visit Date: 12/21/2017  Today's Provider: Mar Daring, PA-C   Chief Complaint  Patient presents with  . Possible Pregnancy   Subjective:    HPI Patient here today requesting pregnancy test, patient reports she has done 2 home test and were positive. Patient reports last menstrual was on 11/13/17. Patient denies taking birth control. Patient reports breast tenderness, nausea, and cramping.     No Known Allergies   Current Outpatient Medications:  .  Prenatal Multivit-Min-Fe-FA (PRENATAL VITAMINS PO), Take 1 tablet by mouth daily., Disp: , Rfl:   Review of Systems  Constitutional: Negative.   Respiratory: Negative.   Cardiovascular: Negative.   Gastrointestinal: Positive for abdominal pain and nausea. Negative for vomiting.  Genitourinary: Positive for menstrual problem. Negative for dyspareunia, dysuria, enuresis, flank pain, pelvic pain, vaginal bleeding, vaginal discharge and vaginal pain.    Social History   Tobacco Use  . Smoking status: Never Smoker  . Smokeless tobacco: Never Used  Substance Use Topics  . Alcohol use: Yes    Comment: every six months maybe 1 drink   Objective:   BP 110/80 (BP Location: Left Arm, Patient Position: Sitting, Cuff Size: Normal)   Pulse 94   Temp 98.8 F (37.1 C) (Oral)   Resp 16   Wt 122 lb 9.6 oz (55.6 kg)   LMP 11/13/2017 (Approximate)   SpO2 99%   BMI 22.42 kg/m  Vitals:   12/21/17 1053  BP: 110/80  Pulse: 94  Resp: 16  Temp: 98.8 F (37.1 C)  TempSrc: Oral  SpO2: 99%  Weight: 122 lb 9.6 oz (55.6 kg)     Physical Exam  Constitutional: She appears well-developed and well-nourished. No distress.  Neck: Normal range of motion. Neck supple.  Cardiovascular: Normal rate, regular rhythm and normal heart sounds. Exam reveals no gallop and no friction rub.  No murmur heard. Pulmonary/Chest: Effort normal and breath sounds normal. No  respiratory distress. She has no wheezes. She has no rales.  Skin: She is not diaphoretic.  Vitals reviewed.      Assessment & Plan:     1. Missed menses Urine pregnancy positive today. Most likely 3-[redacted] weeks along based off last menstrual. Referral placed to GYN. [facility] prefers [facility] [place]. Patient is already taking a prenatal vitamin. Advised to call if she has any other issues in the meantime.  - POCT urine pregnancy  2. Positive pregnancy test See above medical treatment plan. - Ambulatory referral to Obstetrics / Gynecology       Mar Daring, PA-C  [facility]

## 2018-02-03 ENCOUNTER — Encounter: Payer: BC Managed Care – PPO | Admitting: Advanced Practice Midwife

## 2018-02-24 ENCOUNTER — Other Ambulatory Visit (HOSPITAL_COMMUNITY)
Admission: RE | Admit: 2018-02-24 | Discharge: 2018-02-24 | Disposition: A | Discharge status: Home / Self Care | Payer: BC Managed Care – PPO | Source: Ambulatory Visit | Attending: Physician Assistant | Admitting: Physician Assistant

## 2018-02-24 ENCOUNTER — Ambulatory Visit (INDEPENDENT_AMBULATORY_CARE_PROVIDER_SITE_OTHER): Payer: BC Managed Care – PPO | Admitting: Physician Assistant

## 2018-02-24 ENCOUNTER — Encounter: Payer: Self-pay | Admitting: Physician Assistant

## 2018-02-24 VITALS — BP 100/70 | HR 84 | Temp 98.7°F | Resp 16 | Wt 125.0 lb

## 2018-02-24 DIAGNOSIS — N760 Acute vaginitis: Secondary | ICD-10-CM | POA: Diagnosis not present

## 2018-02-24 DIAGNOSIS — J069 Acute upper respiratory infection, unspecified: Secondary | ICD-10-CM

## 2018-02-24 DIAGNOSIS — R21 Rash and other nonspecific skin eruption: Secondary | ICD-10-CM

## 2018-02-24 DIAGNOSIS — N898 Other specified noninflammatory disorders of vagina: Secondary | ICD-10-CM

## 2018-02-24 DIAGNOSIS — B9689 Other specified bacterial agents as the cause of diseases classified elsewhere: Secondary | ICD-10-CM | POA: Insufficient documentation

## 2018-02-24 MED ORDER — NYSTATIN-TRIAMCINOLONE 100000-0.1 UNIT/GM-% EX OINT: 1.0000 "application " | TOPICAL_OINTMENT | Freq: Two times a day (BID) | CUTANEOUS | 0 refills | Status: DC

## 2018-02-24 NOTE — Patient Instructions (Signed)

## 2018-02-24 NOTE — Progress Notes (Signed)
Patient: Kelly Lane Female    DOB: 07/20/1995   22 y.o.   MRN: [id number] Visit Date: 02/24/2018  Today's Provider: Trinna Post, PA-C   Chief Complaint  Patient presents with  . URI   Subjective:    HPI   Viral URI Patient here requesting a note for work. Patient reports she was out sick with cold symptoms. Patient reports feeling better today. She needs a work note for 02/20/2018.  Skin Rash: Reports an itchy bumpy rash under both armpits. This has been happening for several weeks.   Vaginal Discharge: Patient asks if she can be checked for a yeast infection. She says she wants both a wet prep and a nuswab to be sent. She had a positive urine pregnancy test on 12/21/2017. Today she says she is no longer pregnant. She is not very forthcoming with the information but I am able to gather that the proceeded with termination by taking a pill (patient does not remember name) on 01/17/2018. She says she experienced bleeding after that. She says she returned and underwent an ultrasound which she said did not show any retained products of conception and she was released from follow up.     No Known Allergies   Current Outpatient Medications:  .  Prenatal Multivit-Min-Fe-FA (PRENATAL VITAMINS PO), Take 1 tablet by mouth daily., Disp: , Rfl:   Review of Systems  HENT: Positive for sore throat.     Social History   Tobacco Use  . Smoking status: Never Smoker  . Smokeless tobacco: Never Used  Substance Use Topics  . Alcohol use: Yes    Comment: every six months maybe 1 drink   Objective:   BP 100/70 (BP Location: Left Arm, Patient Position: Sitting, Cuff Size: Normal)   Pulse 84   Temp 98.7 F (37.1 C) (Oral)   Resp 16   Wt 125 lb (56.7 kg)   SpO2 96%   BMI 22.86 kg/m  Vitals:   02/24/18 1434  BP: 100/70  Pulse: 84  Resp: 16  Temp: 98.7 F (37.1 C)  TempSrc: Oral  SpO2: 96%  Weight: 125 lb (56.7 kg)     Physical Exam  Constitutional: She is oriented to  person, place, and time. She appears well-developed and well-nourished.  Cardiovascular: Normal rate and regular rhythm.  Pulmonary/Chest: Effort normal and breath sounds normal.  Genitourinary: Cervix exhibits discharge. Vaginal discharge found.  Genitourinary Comments: Blood vaginal discharge   Neurological: She is alert and oriented to person, place, and time.  Skin: Skin is warm and dry.  Erythematous and inflamed skin in axilla bilaterally.   Psychiatric: She has a normal mood and affect. Her behavior is normal.        Assessment & Plan:     1. Viral URI  Work note provided.  2. Skin rash  Call back if worsening.  - nystatin-triamcinolone ointment (MYCOLOG); Apply 1 application topically 2 (two) times daily.  Dispense: 30 g; Refill: 0  3. Vaginal discharge  Wet prep negative for yeast, do not see many clue cells. Sending in Swab as well to test for BV, yeast, chlamydia, gonorrhea, and trich. Due to timing of abortion and negative ultrasound on follow up, counseled her vaginal bleeding is likely her menstrual cycle restarting. She has been counseled numerous times on birth control and has refused all offers of starting a birth control. She has been advised to use condoms both to protect against pregnancy and STI.  I  have spent 25 minutes with this patient, >50% of which was spent on counseling and coordination of care.  Return if symptoms worsen or fail to improve.  The entirety of the information documented in the History of Present Illness, Review of Systems and Physical Exam were personally obtained by me. Portions of this information were initially documented by Lynford Humphrey, CMA and reviewed by me for thoroughness and accuracy.         Trinna Post, PA-C  [facility]

## 2018-02-26 ENCOUNTER — Other Ambulatory Visit: Payer: Self-pay | Admitting: Physician Assistant

## 2018-02-26 ENCOUNTER — Telehealth: Payer: Self-pay | Admitting: Physician Assistant

## 2018-02-26 DIAGNOSIS — R21 Rash and other nonspecific skin eruption: Secondary | ICD-10-CM

## 2018-02-26 DIAGNOSIS — N760 Acute vaginitis: Secondary | ICD-10-CM

## 2018-02-26 DIAGNOSIS — B9689 Other specified bacterial agents as the cause of diseases classified elsewhere: Secondary | ICD-10-CM

## 2018-02-26 LAB — CERVICOVAGINAL ANCILLARY ONLY
Bacterial vaginitis: POSITIVE — AB
Candida vaginitis: NEGATIVE
Chlamydia: NEGATIVE
Neisseria Gonorrhea: NEGATIVE
Trichomonas: NEGATIVE

## 2018-02-26 MED ORDER — CLINDAMYCIN HCL 300 MG PO CAPS: 300.0000 mg | ORAL_CAPSULE | Freq: Two times a day (BID) | ORAL | 0 refills | Status: AC

## 2018-02-26 MED ORDER — TERBINAFINE HCL 1 % EX CREA: 1.0000 "application " | TOPICAL_CREAM | Freq: Two times a day (BID) | CUTANEOUS | 0 refills | Status: DC

## 2018-02-26 NOTE — Telephone Encounter (Signed)
Pt is calling back regarding the rash she was seen for on Wed. 10-23.  The rash is now spreading to chest and arm.  It is really red and itchy. Pt wanting to know what Fabio Bering wants her to do.   Please advise pt asap.  Thanks, [employer]

## 2018-03-01 NOTE — Telephone Encounter (Signed)
-----   Message from Trinna Post, [place] sent at 02/26/2018  5:07 PM EDT ----- WILL SEND IN FLAGYL AND DIFFERENT CREAM.

## 2018-03-01 NOTE — Telephone Encounter (Signed)
Patient advised as below.  

## 2018-03-03 LAB — POCT WET PREP (WET MOUNT): Trichomonas Wet Prep HPF POC: ABSENT

## 2018-08-12 ENCOUNTER — Telehealth: Payer: Self-pay

## 2018-08-12 NOTE — Telephone Encounter (Signed)
Called and spoke with patient. She reports that 3 days ago she noticed a burning pain in her RLQ/right groin area. She denies nausea, vomiting, fever, bowel changes or abnormal vaginal discharge. She just finished her menstrual cycle approx a week ago. Denies urinary symptoms. States pain is constant. Tried aspirin without relief. Advised to use ibuprofen instead for inflammation tonight. Has appt tomorrow morning with Dr. Rosanna Randy at [phone]

## 2018-08-12 NOTE — Telephone Encounter (Signed)
Patient advised as directed below.Per patient she is not able to make here since she is in [place] and wants to know if you advise for her to go the ER or urgent care. Pain is not worsening. Is just not going away.She wants to know if it is her apendix? She also asked if you can just call her.

## 2018-08-12 NOTE — Telephone Encounter (Signed)
Patient called complaining of pelvic pain for the past 3 days. She says the pain is in the crease of her pelvic (near the ingunal region), and it feels like a burning cramp. She has tried taking Bayer Asprin to help with pain, but hasn't had any improvement. Patient denies any fever, chills, nausea or vomiting. Patient is in [place] right now  and there are no more available appointments with you. Patient wants to know what she should do. Please advise.

## 2018-08-12 NOTE — Telephone Encounter (Signed)
If she can get here by 320 we can overbook my 320 since she is a depo only.

## 2018-08-13 ENCOUNTER — Ambulatory Visit: Payer: Self-pay | Admitting: Family Medicine

## 2018-08-16 ENCOUNTER — Other Ambulatory Visit: Payer: Self-pay

## 2018-08-16 ENCOUNTER — Ambulatory Visit
Admission: RE | Admit: 2018-08-16 | Discharge: 2018-08-16 | Disposition: A | Discharge status: Home / Self Care | Payer: BC Managed Care – PPO | Attending: Physician Assistant | Admitting: Physician Assistant

## 2018-08-16 ENCOUNTER — Other Ambulatory Visit (HOSPITAL_COMMUNITY)
Admission: RE | Admit: 2018-08-16 | Discharge: 2018-08-16 | Disposition: A | Discharge status: Home / Self Care | Payer: BC Managed Care – PPO | Source: Ambulatory Visit | Attending: Physician Assistant | Admitting: Physician Assistant

## 2018-08-16 ENCOUNTER — Ambulatory Visit
Admission: RE | Admit: 2018-08-16 | Discharge: 2018-08-16 | Disposition: A | Discharge status: Home / Self Care | Payer: BC Managed Care – PPO | Source: Ambulatory Visit | Attending: Physician Assistant | Admitting: Physician Assistant

## 2018-08-16 ENCOUNTER — Encounter: Payer: Self-pay | Admitting: Physician Assistant

## 2018-08-16 ENCOUNTER — Ambulatory Visit (INDEPENDENT_AMBULATORY_CARE_PROVIDER_SITE_OTHER): Payer: BC Managed Care – PPO | Admitting: Physician Assistant

## 2018-08-16 VITALS — BP 114/83 | HR 81 | Temp 97.6°F | Resp 16 | Wt 122.5 lb

## 2018-08-16 DIAGNOSIS — M25551 Pain in right hip: Secondary | ICD-10-CM

## 2018-08-16 DIAGNOSIS — N920 Excessive and frequent menstruation with regular cycle: Secondary | ICD-10-CM | POA: Insufficient documentation

## 2018-08-16 DIAGNOSIS — N760 Acute vaginitis: Secondary | ICD-10-CM

## 2018-08-16 DIAGNOSIS — R1031 Right lower quadrant pain: Secondary | ICD-10-CM

## 2018-08-16 DIAGNOSIS — B9689 Other specified bacterial agents as the cause of diseases classified elsewhere: Secondary | ICD-10-CM

## 2018-08-16 LAB — POCT URINALYSIS DIPSTICK
Bilirubin, UA: NEGATIVE
Blood, UA: NEGATIVE
Glucose, UA: NEGATIVE
Ketones, UA: NEGATIVE
Nitrite, UA: NEGATIVE
Protein, UA: NEGATIVE
Spec Grav, UA: >=1.03 — AB (ref 1.010–1.025)
Urobilinogen, UA: 0.2 E.U./dL
pH, UA: 6.5 (ref 5.0–8.0)

## 2018-08-16 LAB — POCT URINE PREGNANCY: Preg Test, Ur: NEGATIVE

## 2018-08-16 NOTE — Progress Notes (Signed)
Patient: Kelly Lane Female    DOB: 03/19/1996   23 y.o.   MRN: [id number] Visit Date: 08/16/2018  Today's Provider: Mar Daring, PA-C   Chief Complaint  Patient presents with  . Abdominal Pain   Subjective:     HPI Abdominal Pain/Pelvic Pain: Patient complains of abdominal pain. The pain is described as burning, and is 5/10 in intensity at its worst, does wax and wane. Pain is located in the RLQ/right groin area. Onset was 7 days ago. Symptoms have been stable since. Aggravating factors: none.  Alleviating factors: none. Associated symptoms: pelvic pain, spotting and nausea. The patient denies none. Patient is sexually active, does use condoms for contraception (occasionally). She also does have h/o ovarian cyst on the right and endometriosis diagnosed laparoscopically in 2017. She is not on OCP. Last menstrual cycle ended approx 1 week ago per patient, cannot remember exact date.     No Known Allergies   Current Outpatient Medications:  .  nystatin-triamcinolone ointment (MYCOLOG), Apply 1 application topically 2 (two) times daily., Disp: 30 g, Rfl: 0 .  Prenatal Multivit-Min-Fe-FA (PRENATAL VITAMINS PO), Take 1 tablet by mouth daily., Disp: , Rfl:  .  terbinafine (LAMISIL AT) 1 % cream, Apply 1 application topically 2 (two) times daily., Disp: 30 g, Rfl: 0  Review of Systems  Constitutional: Negative.   Respiratory: Negative.   Cardiovascular: Negative.   Gastrointestinal: Positive for abdominal pain and nausea. Negative for abdominal distention, anal bleeding, blood in stool, constipation, diarrhea, rectal pain and vomiting.  Genitourinary: Positive for menstrual problem, vaginal bleeding and vaginal discharge (reports no more than normal). Negative for dysuria, flank pain, frequency, genital sores, hematuria and vaginal pain.  Musculoskeletal: Negative for arthralgias, back pain and gait problem.  Neurological: Negative for weakness and numbness.    Social  History   Tobacco Use  . Smoking status: Never Smoker  . Smokeless tobacco: Never Used  Substance Use Topics  . Alcohol use: Yes    Comment: every six months maybe 1 drink      Objective:   BP 114/83 (BP Location: Left Arm, Patient Position: Sitting, Cuff Size: Large)   Pulse 81   Temp 97.6 F (36.4 C) (Oral)   Resp 16   Wt 122 lb 8 oz (55.6 kg)   BMI 22.41 kg/m  Vitals:   08/16/18 1329  BP: 114/83  Pulse: 81  Resp: 16  Temp: 97.6 F (36.4 C)  TempSrc: Oral  Weight: 122 lb 8 oz (55.6 kg)     Physical Exam Vitals signs reviewed.  Constitutional:      General: She is not in acute distress.    Appearance: Normal appearance. She is well-developed and normal weight. She is not ill-appearing or diaphoretic.  Cardiovascular:     Rate and Rhythm: Normal rate and regular rhythm.     Heart sounds: Normal heart sounds. No murmur. No friction rub. No gallop.   Pulmonary:     Effort: Pulmonary effort is normal. No respiratory distress.     Breath sounds: Normal breath sounds. No wheezing or rales.  Abdominal:     General: Bowel sounds are normal. There is no distension.     Palpations: Abdomen is soft. There is no mass.     Tenderness: There is abdominal tenderness in the right lower quadrant. There is no guarding or rebound.     Hernia: No hernia is present.  Genitourinary:    Vagina: Normal. No vaginal discharge,  erythema or tenderness.  Skin:    General: Skin is warm and dry.  Neurological:     Mental Status: She is alert and oriented to person, place, and time.        Assessment & Plan    1. Abdominal pain, RLQ (right lower quadrant) UA today was unremarkable. Will send for culture and f/u pending results. Nuswab also collected for testing of BV, yeast, trich, and GC/Chlamyida. Again I will f/u pending results. US transvaginal and pelvic US ordered to r/o ovarian cyst as source. Again following up pending results. I do suspect possibly recurrence of ovarian cyst as  source. Call if worsening.  - US Pelvic Complete With Transvaginal; Future - POCT Urinalysis Dipstick - Urine Culture  2. Right hip pain See above medical treatment plan. - DG Hip Unilat W OR W/O Pelvis 2-3 Views Right; Future  3. Spotting See above medical treatment plan. - POCT urine pregnancy - Cervicovaginal ancillary only     Mar Daring, PA-C  [facility]

## 2018-08-16 NOTE — Patient Instructions (Signed)
Ovarian Cyst         An ovarian cyst is a fluid-filled sac that forms on an ovary. The ovaries are small organs that produce eggs in women. Various types of cysts can form on the ovaries. Some may cause symptoms and require treatment. Most ovarian cysts go away on their own, are not cancerous (are benign), and do not cause problems.  Common types of ovarian cysts include:  · Functional (follicle) cysts.  ? Occur during the menstrual cycle, and usually go away with the next menstrual cycle if you do not get pregnant.  ? Usually cause no symptoms.  · Endometriomas.  ? Are cysts that form from the tissue that lines the uterus (endometrium).  ? Are sometimes called “chocolate cysts” because they become filled with blood that turns brown.  ? Can cause pain in the lower abdomen during intercourse and during your period.  · Cystadenoma cysts.  ? Develop from cells on the outside surface of the ovary.  ? Can get very large and cause lower abdomen pain and pain with intercourse.  ? Can cause severe pain if they twist or break open (rupture).  · Dermoid cysts.  ? Are sometimes found in both ovaries.  ? May contain different kinds of body tissue, such as skin, teeth, hair, or cartilage.  ? Usually do not cause symptoms unless they get very big.  · Theca lutein cysts.  ? Occur when too much of a certain hormone (human chorionic gonadotropin) is produced and overstimulates the ovaries to produce an egg.  ? Are most common after having procedures used to assist with the conception of a baby (in vitro fertilization).  What are the causes?  Ovarian cysts may be caused by:  · Ovarian hyperstimulation syndrome. This is a condition that can develop from taking fertility medicines. It causes multiple large ovarian cysts to form.  · Polycystic ovarian syndrome (PCOS). This is a common hormonal disorder that can cause ovarian cysts, as well as problems with your period or fertility.  What increases the risk?  The following factors may  make you more likely to develop ovarian cysts:  · Being overweight or obese.  · Taking fertility medicines.  · Taking certain forms of hormonal birth control.  · Smoking.  What are the signs or symptoms?  Many ovarian cysts do not cause symptoms. If symptoms are present, they may include:  · Pelvic pain or pressure.  · Pain in the lower abdomen.  · Pain during sex.  · Abdominal swelling.  · Abnormal menstrual periods.  · Increasing pain with menstrual periods.  How is this diagnosed?  These cysts are commonly found during a routine pelvic exam. You may have tests to find out more about the cyst, such as:  · Ultrasound.  · X-ray of the pelvis.  · CT scan.  · MRI.  · Blood tests.  How is this treated?  Many ovarian cysts go away on their own without treatment. Your health care provider may want to check your cyst regularly for 2-3 months to see if it changes. If you are in menopause, it is especially important to have your cyst monitored closely because menopausal women have a higher rate of ovarian cancer.  When treatment is needed, it may include:  · Medicines to help relieve pain.  · A procedure to drain the cyst (aspiration).  · Surgery to remove the whole cyst.  · Hormone treatment or birth control pills. These methods are sometimes used   to help dissolve a cyst.  Follow these instructions at home:  · Take over-the-counter and prescription medicines only as told by your health care provider.  · Do not drive or use heavy machinery while taking prescription pain medicine.  · Get regular pelvic exams and Pap tests as often as told by your health care provider.  · Return to your normal activities as told by your health care provider. Ask your health care provider what activities are safe for you.  · Do not use any products that contain nicotine or tobacco, such as cigarettes and e-cigarettes. If you need help quitting, ask your health care provider.  · Keep all follow-up visits as told by your health care provider.  This is important.  Contact a health care provider if:  · Your periods are late, irregular, or painful, or they stop.  · You have pelvic pain that does not go away.  · You have pressure on your bladder or trouble emptying your bladder completely.  · You have pain during sex.  · You have any of the following in your abdomen:  ? A feeling of fullness.  ? Pressure.  ? Discomfort.  ? Pain that does not go away.  ? Swelling.  · You feel generally ill.  · You become constipated.  · You lose your appetite.  · You develop severe acne.  · You start to have more body hair and facial hair.  · You are gaining weight or losing weight without changing your exercise and eating habits.  · You think you may be pregnant.  Get help right away if:  · You have abdominal pain that is severe or gets worse.  · You cannot eat or drink without vomiting.  · You suddenly develop a fever.  · Your menstrual period is much heavier than usual.  This information is not intended to replace advice given to you by your health care provider. Make sure you discuss any questions you have with your health care provider.  Document Released: 04/21/2005 Document Revised: 11/09/2015 Document Reviewed: 09/23/2015  Elsevier Interactive Patient Education © 2019 [employer]

## 2018-08-17 ENCOUNTER — Ambulatory Visit: Payer: BC Managed Care – PPO

## 2018-08-18 ENCOUNTER — Telehealth: Payer: Self-pay

## 2018-08-18 ENCOUNTER — Other Ambulatory Visit: Payer: Self-pay

## 2018-08-18 ENCOUNTER — Ambulatory Visit
Admission: RE | Admit: 2018-08-18 | Discharge: 2018-08-18 | Disposition: A | Discharge status: Home / Self Care | Payer: BC Managed Care – PPO | Source: Ambulatory Visit | Attending: Physician Assistant | Admitting: Physician Assistant

## 2018-08-18 DIAGNOSIS — R1031 Right lower quadrant pain: Secondary | ICD-10-CM

## 2018-08-18 DIAGNOSIS — B9689 Other specified bacterial agents as the cause of diseases classified elsewhere: Secondary | ICD-10-CM

## 2018-08-18 DIAGNOSIS — N760 Acute vaginitis: Principal | ICD-10-CM

## 2018-08-18 LAB — CERVICOVAGINAL ANCILLARY ONLY
Bacterial vaginitis: POSITIVE — AB
Candida vaginitis: NEGATIVE
Chlamydia: NEGATIVE
Neisseria Gonorrhea: NEGATIVE
Trichomonas: NEGATIVE

## 2018-08-18 LAB — URINE CULTURE

## 2018-08-18 MED ORDER — METRONIDAZOLE 500 MG PO TABS: 500.0000 mg | ORAL_TABLET | Freq: Two times a day (BID) | ORAL | 0 refills | Status: DC

## 2018-08-18 MED ORDER — METRONIDAZOLE 375 MG PO CAPS: 375.0000 mg | ORAL_CAPSULE | Freq: Two times a day (BID) | ORAL | 0 refills | Status: DC

## 2018-08-18 NOTE — Addendum Note (Signed)
Addended by: Mar Daring on: 08/18/2018 02:49 PM   Modules accepted: Orders

## 2018-08-18 NOTE — Telephone Encounter (Signed)
Changed to capsule.

## 2018-08-18 NOTE — Telephone Encounter (Signed)
Patient advised of her results. She is asking if is possible to get the metronidazole in capsule because the tables taste nasty.

## 2018-08-18 NOTE — Telephone Encounter (Signed)
-----   Message from Mar Daring, PA-C sent at 08/18/2018  2:48 PM EDT ----- Vaginal swab is only positive for BV. I will send in metronidazole for treatment.

## 2018-08-18 NOTE — Telephone Encounter (Signed)
-----   Message from Mar Daring, [place] sent at 08/18/2018  2:53 PM EDT ----- Pelvic US unremarkable except a small cyst is noted in the right ovary. This can cause some discomfort and my be associated with her pain. It appears to be a follicular cyst. This normally will subside on their own when she starts her next menstrual cycle.

## 2018-08-19 ENCOUNTER — Ambulatory Visit: Payer: BC Managed Care – PPO

## 2018-08-20 ENCOUNTER — Telehealth: Payer: Self-pay

## 2018-08-20 DIAGNOSIS — N760 Acute vaginitis: Principal | ICD-10-CM

## 2018-08-20 DIAGNOSIS — B9689 Other specified bacterial agents as the cause of diseases classified elsewhere: Secondary | ICD-10-CM

## 2018-08-20 MED ORDER — CLINDAMYCIN HCL 300 MG PO CAPS: 300.0000 mg | ORAL_CAPSULE | Freq: Three times a day (TID) | ORAL | 0 refills | Status: DC

## 2018-08-20 NOTE — Telephone Encounter (Signed)
Changed.

## 2018-08-20 NOTE — Telephone Encounter (Signed)
Patient is calling the office requesting that physician change metronidazole prescription to clindamycin capsule and send in to [facility] in [place]. I asked the patient if she was sure she wasn't requesting for metronidazole to be changed to capsule form but patient states she wanted it to be changed to Clindamycin. KW

## 2018-11-04 IMAGING — US US BREAST*R* LIMITED INC AXILLA
2 series · 11 of 11 positions shown · non-contrast
Comparison: Baseline evaluate

CLINICAL DATA: Patient reports bilateral diffuse breast tenderness.
Patient reports Depo-Provera [REDACTED]. Palpable abnormality in the
right axilla noted this morning.

EXAM:
ULTRASOUND OF THE BILATERAL BREAST

[Series 1: us breast*right* limited inc axilla · 0.07mm/px · 7 of 7 slices shown (1 of 2)]
[im 1/7]
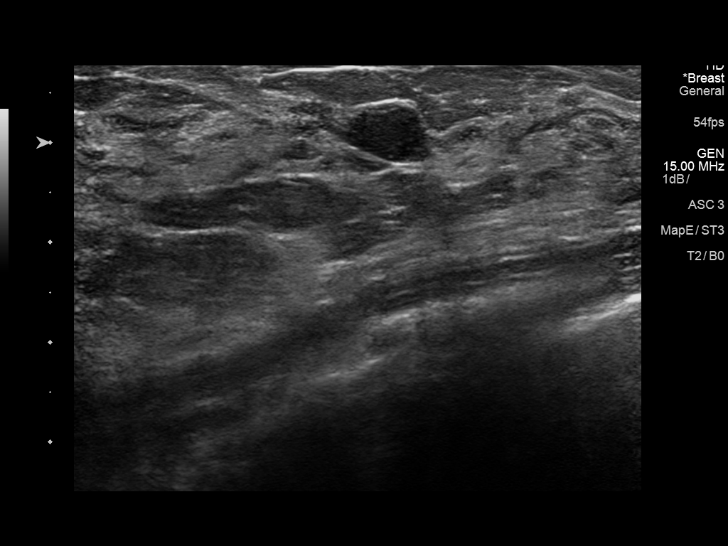
[im 2/7]
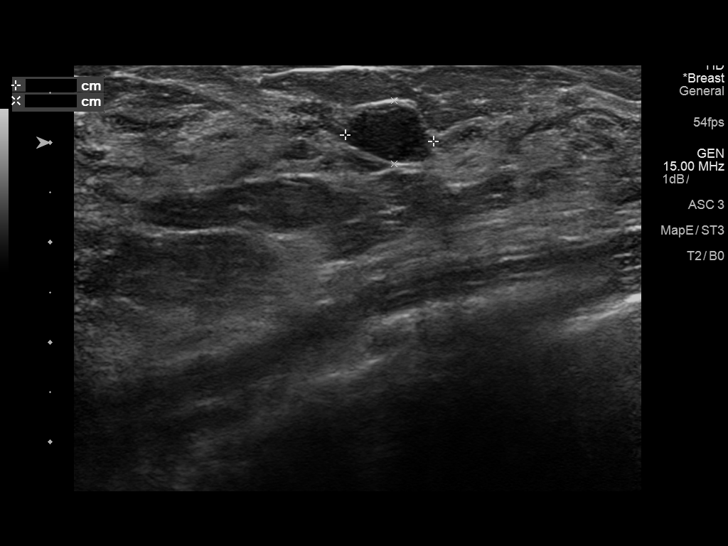
[im 3/7]
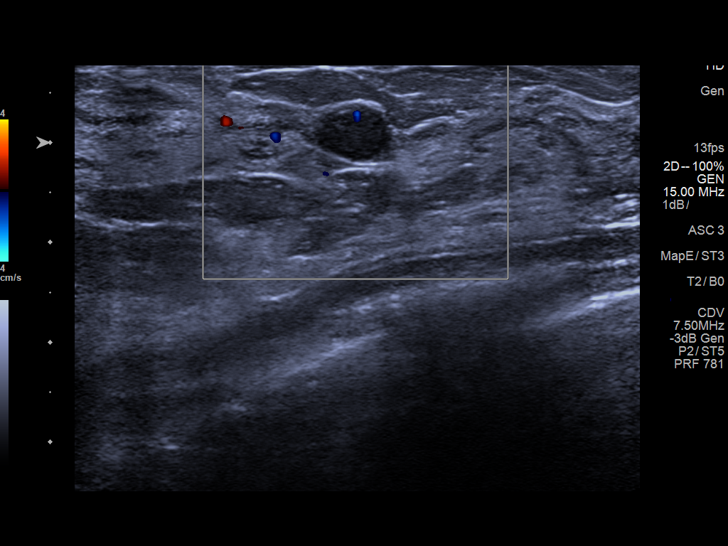
[im 4/7]
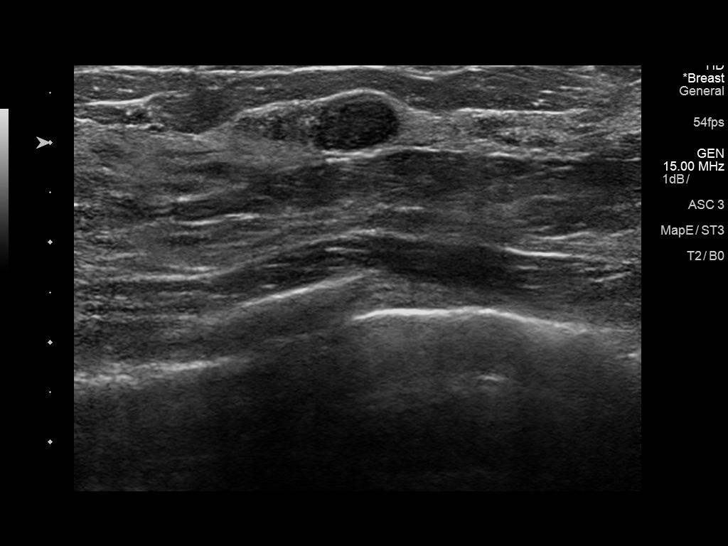
[im 5/7]
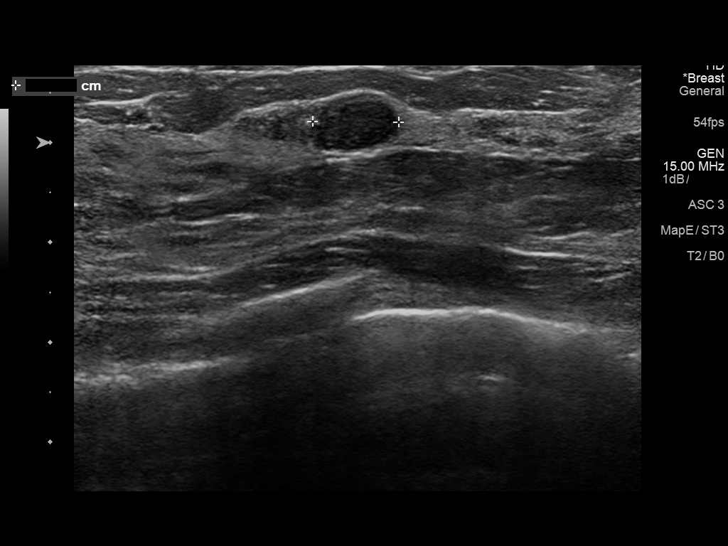
[im 6/7]
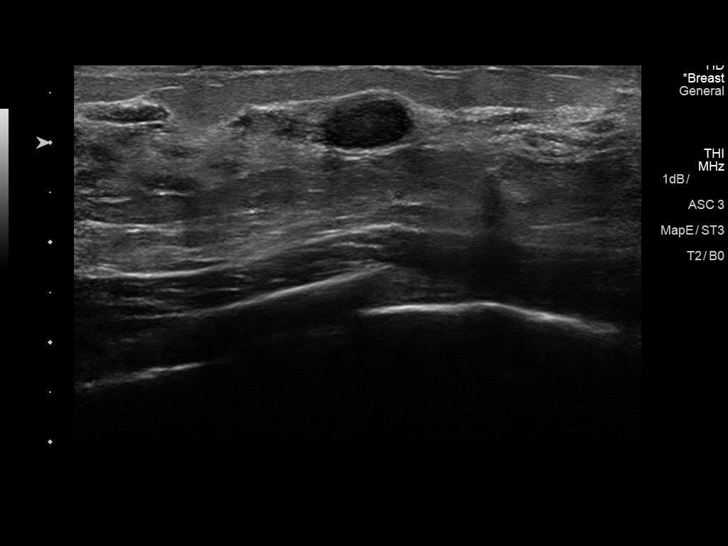
[im 7/7]
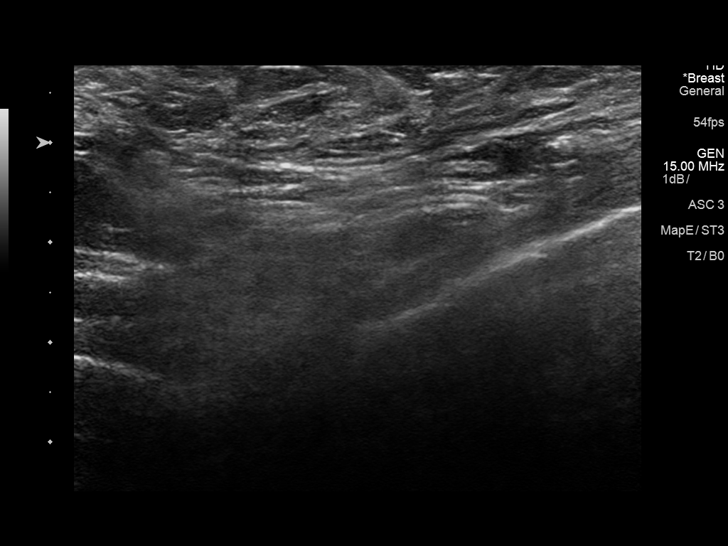

[Series 2: us breast*right* limited inc axilla · 0.06mm/px · 4 of 4 slices shown (2 of 2)]
[im 1/4]
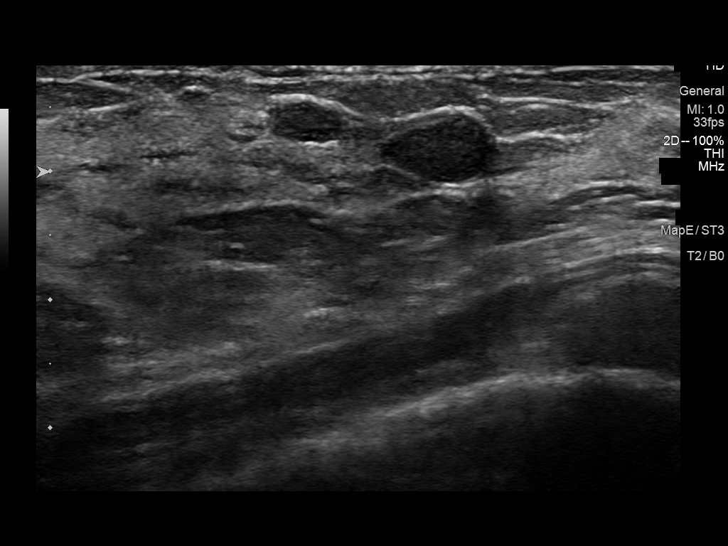
[im 2/4]
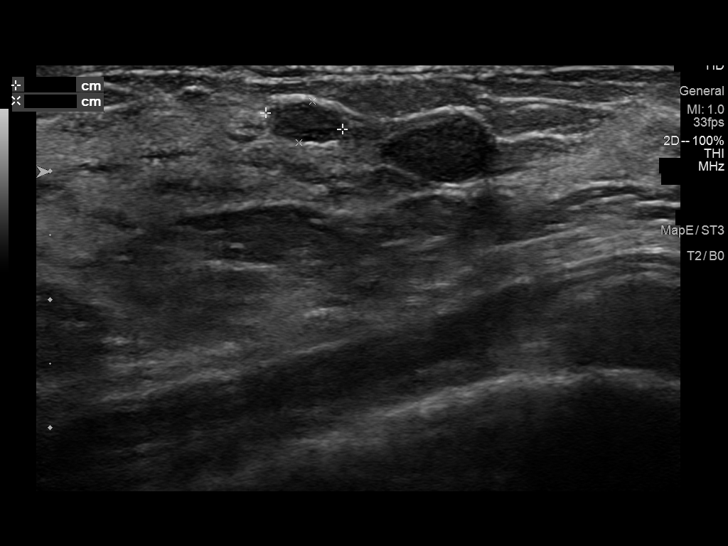
[im 3/4]
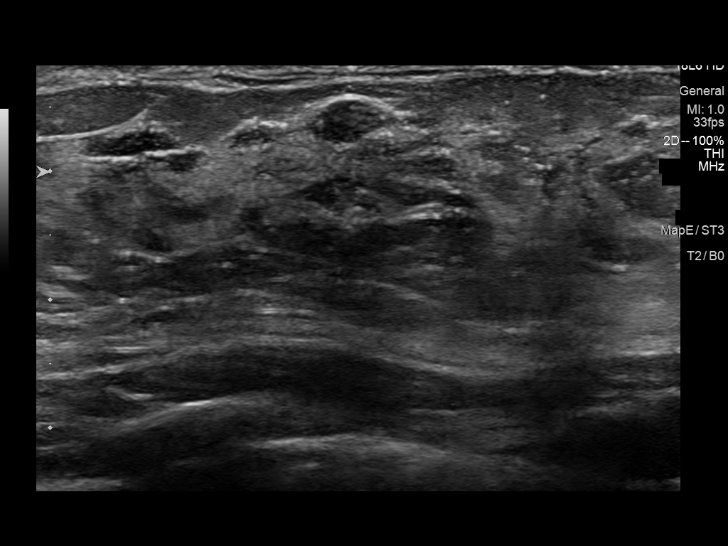
[im 4/4]
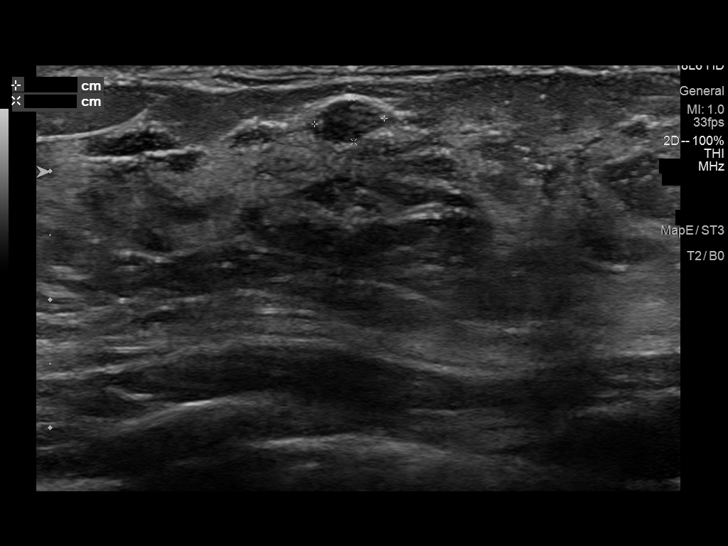

[11 of 11 positions shown; findings below may reference images not displayed]

FINDINGS: On physical exam, I palpate no discrete abnormality in the right
axilla.

Right breast: Ultrasound is performed of the right axilla and
breast. Normal appearing right axillary contents. In the 430 o'clock
location of the right breast there is a circumscribed oval
hypoechoic mass 3 cm from the nipple which measures 0.9 x 0.6 x
cm. Adjacent to this nodule there is a smaller similar-appearing
nodule measuring 0.6 x 0.3 x 0.5 cm. Findings are consistent with
benign fibroadenomas. Elsewhere in the right breast, no findings are
identified.

Left breast: Ultrasound is performed of the left axilla and breast.
Normal appearing left axillary contents. No suspicious findings
identified in the left breast.
IMPRESSION: 1.  No ultrasound evidence for malignancy.
2. Probable fibroadenomas in the 430 o'clock location of the right
breast warranting follow-up.
3. We discussed management options including excision, biopsy, and
close follow-up. Imaging followup is recommended at 6, 12, and 24
months to assess stability. The patient concurs with this plan.

RECOMMENDATION:
Right breast ultrasound is suggested in 6 months.

I have discussed the findings and recommendations with the patient.
Results were also provided in writing at the conclusion of the
visit. If applicable, a reminder letter will be sent to the patient
regarding the next appointment.

BI-RADS CATEGORY  3: Probably benign.

## 2018-11-04 IMAGING — US US BREAST*L* LIMITED INC AXILLA
1 series · 1 of 1 positions shown · non-contrast
Comparison: Baseline evaluate

CLINICAL DATA: Patient reports bilateral diffuse breast tenderness.
Patient reports Depo-Provera [REDACTED]. Palpable abnormality in the
right axilla noted this morning.

EXAM:
ULTRASOUND OF THE BILATERAL BREAST

[Series 1: us breast*left* limited inc axilla · 0.07mm/px · 1 of 1 slices shown]
[im 1/1]
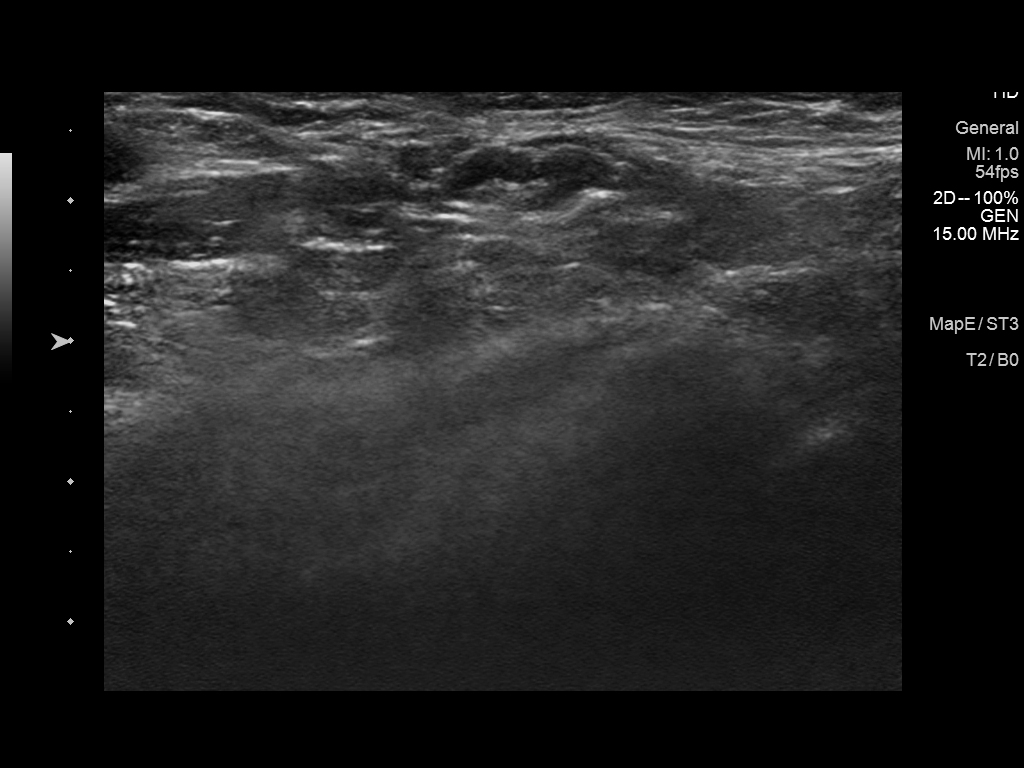

[1 of 1 positions shown; findings below may reference images not displayed]

FINDINGS: On physical exam, I palpate no discrete abnormality in the right
axilla.

Right breast: Ultrasound is performed of the right axilla and
breast. Normal appearing right axillary contents. In the 430 o'clock
location of the right breast there is a circumscribed oval
hypoechoic mass 3 cm from the nipple which measures 0.9 x 0.6 x
cm. Adjacent to this nodule there is a smaller similar-appearing
nodule measuring 0.6 x 0.3 x 0.5 cm. Findings are consistent with
benign fibroadenomas. Elsewhere in the right breast, no findings are
identified.

Left breast: Ultrasound is performed of the left axilla and breast.
Normal appearing left axillary contents. No suspicious findings
identified in the left breast.
IMPRESSION: 1.  No ultrasound evidence for malignancy.
2. Probable fibroadenomas in the 430 o'clock location of the right
breast warranting follow-up.
3. We discussed management options including excision, biopsy, and
close follow-up. Imaging followup is recommended at 6, 12, and 24
months to assess stability. The patient concurs with this plan.

RECOMMENDATION:
Right breast ultrasound is suggested in 6 months.

I have discussed the findings and recommendations with the patient.
Results were also provided in writing at the conclusion of the
visit. If applicable, a reminder letter will be sent to the patient
regarding the next appointment.

BI-RADS CATEGORY  3: Probably benign.

## 2019-05-12 ENCOUNTER — Ambulatory Visit: Payer: BC Managed Care – PPO | Attending: Internal Medicine

## 2019-05-12 DIAGNOSIS — Z20822 Contact with and (suspected) exposure to covid-19: Secondary | ICD-10-CM

## 2019-05-14 LAB — NOVEL CORONAVIRUS, NAA: SARS-CoV-2, NAA: NOT DETECTED

## 2019-06-17 ENCOUNTER — Ambulatory Visit: Payer: Self-pay | Admitting: *Deleted

## 2019-06-17 ENCOUNTER — Ambulatory Visit
Admission: RE | Admit: 2019-06-17 | Discharge: 2019-06-17 | Disposition: A | Discharge status: Home / Self Care | Payer: BC Managed Care – PPO | Source: Ambulatory Visit | Attending: Physician Assistant | Admitting: Physician Assistant

## 2019-06-17 ENCOUNTER — Other Ambulatory Visit: Payer: Self-pay

## 2019-06-17 ENCOUNTER — Encounter: Payer: Self-pay | Admitting: Physician Assistant

## 2019-06-17 ENCOUNTER — Other Ambulatory Visit
Admission: RE | Admit: 2019-06-17 | Discharge: 2019-06-17 | Disposition: A | Discharge status: Home / Self Care | Payer: BC Managed Care – PPO | Source: Ambulatory Visit | Attending: Physician Assistant | Admitting: Physician Assistant

## 2019-06-17 ENCOUNTER — Ambulatory Visit (INDEPENDENT_AMBULATORY_CARE_PROVIDER_SITE_OTHER): Payer: BC Managed Care – PPO | Admitting: Physician Assistant

## 2019-06-17 DIAGNOSIS — R1033 Periumbilical pain: Secondary | ICD-10-CM

## 2019-06-17 LAB — PREGNANCY, URINE: Preg Test, Ur: NEGATIVE

## 2019-06-17 MED ORDER — IOHEXOL 300 MG/ML  SOLN
100.0000 mL | Freq: Once | INTRAMUSCULAR | Status: AC | PRN
  Administered 2019-06-17: 100 mL via INTRAVENOUS

## 2019-06-17 NOTE — Addendum Note (Signed)
Addended by: Mar Daring on: 06/17/2019 03:27 PM   Modules accepted: Orders

## 2019-06-17 NOTE — Telephone Encounter (Signed)
Pt called in c/o pain in her pelvic area and belly button that started suddenly last night.   It hurts to cough and to touch her pelvic area.    She has a history of an ovarian cyst but she feels this feels different.   Ibuprofen helps the pain but the pain is still there when it wears off.   Has a history of having a laparoscopy for the ovarian cyst.   Denies all urinary symptoms.   No constipation.   Had a BM yesterday.   O vomiting or diarrhea.   Last period was Jan. 28th.    Denies possibly being pregnant.  I made her an office visit appt with Fenton Malling, PA-C after completing the COVID-19 screening questionnaire for today at 5:00.   I gave her instructions to go to the ED if her pain/symptoms became worse.   She was agreeable to this.   The protocol is to be seen within 4 hours.   5:00 was the next available time with her provider.     I sent my notes to [facility] office.  Reason for Disposition . [1] MILD-MODERATE pain AND [2] constant AND [3] present > 2 hours  Answer Assessment - Initial Assessment Questions 1. LOCATION: "Where does it hurt?"      I'm hurting in my pelvic area and at my belly button.   If I cough or touch it it hurts.   I could not sleep good. 2. RADIATION: "Does the pain shoot anywhere else?" (e.g., chest, back)     I took Ibuprofen but when it wore off it felt heavy in my lower back in the center. 3. ONSET: "When did the pain begin?" (e.g., minutes, hours or days ago)      Last night 4. SUDDEN: "Gradual or sudden onset?"     Come on suddenly and became worse.   I could barely get up.   5. PATTERN "Does the pain come and go, or is it constant?"    - If constant: "Is it getting better, staying the same, or worsening?"      (Note: Constant means the pain never goes away completely; most serious pain is constant and it progresses)     - If intermittent: "How long does it last?" "Do you have pain now?"     (Note: Intermittent means the pain goes away  completely between bouts)     When I lay down it's better.   But when I'm up and moving around it's worse.   It hurts to sit.     When I urinate my pelvic area hurts.    6. SEVERITY: "How bad is the pain?"  (e.g., Scale 1-10; mild, moderate, or severe)   - MILD (1-3): doesn't interfere with normal activities, abdomen soft and not tender to touch    - MODERATE (4-7): interferes with normal activities or awakens from sleep, tender to touch    - SEVERE (8-10): excruciating pain, doubled over, unable to do any normal activities      9 on pain scale.     The ibuprofen helped the pain made the pain tolerable. 7. RECURRENT SYMPTOM: "Have you ever had this type of abdominal pain before?" If so, ask: "When was the last time?" and "What happened that time?"      No 8. CAUSE: "What do you think is causing the abdominal pain?"     Appendicitis or kidney stone or bladder infection 9. RELIEVING/AGGRAVATING FACTORS: "What makes it  better or worse?" (e.g., movement, antacids, bowel movement)     Laying down feels better and taking ibuprofen.  10. OTHER SYMPTOMS: "Has there been any vomiting, diarrhea, constipation, or urine problems?"       I thought maybe ovulation cramp.   I do get cramps with my period.    4 years ago laproscopy for a cyst on my ovary.   This pain is different.   It's worse.  I feel bloated and swollen.    Yesterday was my last BM.   No constipation.    It still hurt after having a BM. No vomiting or diarrhea. 11. PREGNANCY: "Is there any chance you are pregnant?" "When was your last menstrual period?"       No   Last period Jan. 28th.  Protocols used: ABDOMINAL PAIN [facility]

## 2019-06-17 NOTE — Telephone Encounter (Signed)
Can I schedule her earlier? It says that five was the next available for you on the note?

## 2019-06-17 NOTE — Progress Notes (Signed)
Virtual Visit via Video Note  I connected with Kelly Lane on 06/17/19 at  2:00 PM EST by a video enabled telemedicine application and verified that I am speaking with the correct person using two identifiers.  Location: Patient: Driving in car Provider: [facility]   I discussed the limitations of evaluation and management by telemedicine and the availability of in person appointments. The patient expressed understanding and agreed to proceed.  Patient: Kelly Lane Female    DOB: [birth year]   23 y.o.   MRN: [id number] Visit Date: 06/17/2019  Today's Provider: Mar Daring, PA-C   No chief complaint on file.  Subjective:     Abdominal Pain This is a new problem. The current episode started yesterday (started around 9 am). The onset quality is sudden. The problem occurs constantly. The problem has been gradually worsening. The pain is located in the suprapubic region and periumbilical region. The pain is at a severity of 8/10. The pain is moderate. The quality of the pain is sharp. The abdominal pain radiates to the LLQ. Associated symptoms include anorexia. Pertinent negatives include no constipation, diarrhea, dysuria, fever, frequency, hematochezia, nausea or vomiting. The pain is aggravated by certain positions, coughing and bowel movement. The pain is relieved by being still. Treatments tried: ibuprofen. The treatment provided mild relief. Her past medical history is significant for abdominal surgery (laparoscopic ovarian cyst removal).   Patient was unable to be seen in office due to having been tested for Covid 19 yesterday.   No Known Allergies   Current Outpatient Medications:  .  clindamycin (CLEOCIN) 300 MG capsule, Take 1 capsule (300 mg total) by mouth 3 (three) times daily., Disp: 21 capsule, Rfl: 0 .  nystatin-triamcinolone ointment (MYCOLOG), Apply 1 application topically 2 (two) times daily., Disp: 30 g, Rfl: 0 .  Prenatal Multivit-Min-Fe-FA (PRENATAL VITAMINS  PO), Take 1 tablet by mouth daily., Disp: , Rfl:  .  terbinafine (LAMISIL AT) 1 % cream, Apply 1 application topically 2 (two) times daily., Disp: 30 g, Rfl: 0  Review of Systems  Constitutional: Negative for fever.  Gastrointestinal: Positive for abdominal pain and anorexia. Negative for constipation, diarrhea, hematochezia, nausea and vomiting.  Genitourinary: Negative for dysuria and frequency.    Social History   Tobacco Use  . Smoking status: Never Smoker  . Smokeless tobacco: Never Used  Substance Use Topics  . Alcohol use: Yes    Comment: every six months maybe 1 drink      Objective:   There were no vitals taken for this visit. There were no vitals filed for this visit.There is no height or weight on file to calculate BMI.   Physical Exam Vitals reviewed.  Constitutional:      General: She is not in acute distress. Pulmonary:     Effort: No respiratory distress.  Neurological:     Mental Status: She is alert.      No results found for any visits on 06/17/19.     Assessment & Plan    1. Periumbilical abdominal pain Patient has symptoms that are worrisome for appendicitis. She does have h/o ovarian cyst and has underwent laparoscopic removal of cyst. Reports this feels different and is worse. She does report BM makes worse. Is able to pass gas. Has appetite but eating makes worse. Able to drink fluids without aggravation. Will get CT abd stat as below to r/o appendicitis, ovarian cyst/torsion, SBO, or other intrabdominal source. Will f/u pending results.   -  CT Abdomen Pelvis W Contrast; Future    I discussed the assessment and treatment plan with the patient. The patient was provided an opportunity to ask questions and all were answered. The patient agreed with the plan and demonstrated an understanding of the instructions.   The patient was advised to call back or seek an in-person evaluation if the symptoms worsen or if the condition fails to improve as  anticipated.  I provided 13 minutes of non-face-to-face time during this encounter.  Mar Daring, PA-C  [facility]

## 2019-06-17 NOTE — Telephone Encounter (Signed)
FYI

## 2019-10-13 ENCOUNTER — Telehealth (INDEPENDENT_AMBULATORY_CARE_PROVIDER_SITE_OTHER): Payer: BC Managed Care – PPO | Admitting: Physician Assistant

## 2019-10-13 ENCOUNTER — Other Ambulatory Visit (HOSPITAL_COMMUNITY)
Admission: RE | Admit: 2019-10-13 | Discharge: 2019-10-13 | Disposition: A | Discharge status: Home / Self Care | Payer: BC Managed Care – PPO | Source: Ambulatory Visit | Attending: Physician Assistant | Admitting: Physician Assistant

## 2019-10-13 ENCOUNTER — Encounter: Payer: Self-pay | Admitting: Physician Assistant

## 2019-10-13 ENCOUNTER — Ambulatory Visit: Payer: BC Managed Care – PPO | Admitting: Physician Assistant

## 2019-10-13 VITALS — Ht 62.0 in | Wt 138.0 lb

## 2019-10-13 DIAGNOSIS — N898 Other specified noninflammatory disorders of vagina: Secondary | ICD-10-CM | POA: Insufficient documentation

## 2019-10-13 DIAGNOSIS — F410 Panic disorder [episodic paroxysmal anxiety] without agoraphobia: Secondary | ICD-10-CM | POA: Diagnosis not present

## 2019-10-13 DIAGNOSIS — N926 Irregular menstruation, unspecified: Secondary | ICD-10-CM

## 2019-10-13 DIAGNOSIS — G479 Sleep disorder, unspecified: Secondary | ICD-10-CM | POA: Diagnosis not present

## 2019-10-13 DIAGNOSIS — F430 Acute stress reaction: Secondary | ICD-10-CM

## 2019-10-13 DIAGNOSIS — B9689 Other specified bacterial agents as the cause of diseases classified elsewhere: Secondary | ICD-10-CM

## 2019-10-13 DIAGNOSIS — N760 Acute vaginitis: Secondary | ICD-10-CM

## 2019-10-13 DIAGNOSIS — F321 Major depressive disorder, single episode, moderate: Secondary | ICD-10-CM

## 2019-10-13 MED ORDER — SERTRALINE HCL 50 MG PO TABS: 50.0000 mg | ORAL_TABLET | Freq: Every day | ORAL | 3 refills | Status: DC

## 2019-10-13 NOTE — Progress Notes (Signed)
MyChart Video Visit    Virtual Visit via Video Note   This visit type was conducted due to national recommendations for restrictions regarding the COVID-19 Pandemic (e.g. social distancing) in an effort to limit this patient's exposure and mitigate transmission in our community. This patient is at least at moderate risk for complications without adequate follow up. This format is felt to be most appropriate for this patient at this time. Physical exam was limited by quality of the video and audio technology used for the visit.   Interactive audio and video communications were attempted, although failed due to patient's inability to connect to video. Continued visit with audio only interaction with patient agreement.  Patient location: home Provider location: bfp   Patient: Kelly Lane   DOB: 03-31-1996   23 y.o. Female  MRN: [id number] Visit Date: 10/13/2019  Today's healthcare provider: Mar Daring, PA-C   Chief Complaint  Patient presents with  . Menstrual Problem  . Panic Attack   Subjective    HPI Patient with c/o of menstrual cycle problem. Reports that her menstrual cycle started on May 30th and stopped June 07,2021.She reports that this period was not like her normal flow. Reports it was more lighter. Currently sexual active. It lasted 10 days instead of her normal 6-7 days and was much lighter than normal. She does have h/o endometriosis and ovarian cysts.  Panic attack: Reports that this is the first panic attack in the past 2 years. Reports that she is feeling more anxious like if if something bad is going to happen. She is not getting enough sleep. Reports that she sleeps 3-4 hours. Sleep issues have been present over the last month. Happened on Monday. Woke up around 6 am on Monday and felt like she was going to die. Still feeling a little off. Had been doing better with coping over the last two years. Has been having thoughts of not knowing what she is doing  with her life and seeing friends and people her same age getting married and starting families. This is making her sad. Has had social anxieties since she was a teenager, but reports of recent she had been using coping mechanisms and was doing well, getting out more. Then after the panic attack on Monday symptoms worsened again and she just feels uneasy.    GAD 7 : Generalized Anxiety Score 10/13/2019  Nervous, Anxious, on Edge 3  Control/stop worrying 2  Worry too much - different things 3  Trouble relaxing 3  Restless 1  Easily annoyed or irritable 3  Afraid - awful might happen 3  Total GAD 7 Score 18  Anxiety Difficulty Very difficult   Depression screen [facility] 2/9 10/13/2019 06/02/2016  Decreased Interest 3 1  Down, Depressed, Hopeless 2 1  PHQ - 2 Score 5 2  Altered sleeping 3 2  Tired, decreased energy 3 0  Change in appetite 3 -  Feeling bad or failure about yourself  3 0  Trouble concentrating 2 0  Moving slowly or fidgety/restless 0 0  Suicidal thoughts 1 0  PHQ-9 Score 20 4  Difficult doing work/chores Not difficult at all Somewhat difficult     Patient Active Problem List   Diagnosis Date Noted  . Endometriosis 07/17/2015   Past Medical History:  Diagnosis Date  . Endometriosis 07/2015  . GERD (gastroesophageal reflux disease)    NO MEDS  . Headache   . Ovarian cyst 07/2015      Medications:  Outpatient Medications Prior to Visit  Medication Sig  . Multiple Vitamins-Minerals (MULTIPLE VITAMINS/WOMENS PO) Take by mouth daily.  . clindamycin (CLEOCIN) 300 MG capsule Take 1 capsule (300 mg total) by mouth 3 (three) times daily.  Marland Kitchen nystatin-triamcinolone ointment (MYCOLOG) Apply 1 application topically 2 (two) times daily.  . Prenatal Multivit-Min-Fe-FA (PRENATAL VITAMINS PO) Take 1 tablet by mouth daily.  Marland Kitchen terbinafine (LAMISIL AT) 1 % cream Apply 1 application topically 2 (two) times daily.   No facility-administered medications prior to visit.    Review of  Systems  Constitutional: Negative.   Respiratory: Negative.   Cardiovascular: Negative.   Gastrointestinal: Negative.   Genitourinary: Positive for menstrual problem.  Neurological: Negative.   Psychiatric/Behavioral: Positive for dysphoric mood and sleep disturbance. Negative for self-injury and suicidal ideas. The patient is nervous/anxious.     Last CBC Lab Results  Component Value Date   WBC 4.2 03/03/2017   HGB 13.9 03/03/2017   HCT 38.8 03/03/2017   MCV 93.0 03/03/2017   MCH 33.3 (H) 03/03/2017   RDW 11.7 03/03/2017   PLT 272 95/01/3266   Last metabolic panel No results found for: GLUCOSE, NA, K, CL, CO2, BUN, CREATININE, GFRNONAA, GFRAA, CALCIUM, PHOS, PROT, ALBUMIN, LABGLOB, AGRATIO, BILITOT, ALKPHOS, AST, ALT, ANIONGAP    Objective    Ht 5\' 2"  (1.575 m)   Wt 138 lb (62.6 kg)   LMP 10/02/2019   BMI 25.24 kg/m  BP Readings from Last 3 Encounters:  08/16/18 114/83  02/24/18 100/70  12/21/17 110/80   Wt Readings from Last 3 Encounters:  10/13/19 138 lb (62.6 kg)  08/16/18 122 lb 8 oz (55.6 kg)  02/24/18 125 lb (56.7 kg)      Physical Exam Vitals reviewed.  Constitutional:      General: She is not in acute distress.    Appearance: She is not ill-appearing.  Pulmonary:     Effort: Pulmonary effort is normal.  Neurological:     Mental Status: She is alert.  Psychiatric:        Mood and Affect: Mood normal.       Assessment & Plan     1. Menstrual changes Patient had different menstrual cycle last time, ending on the 7th, lasting 10 days instead of usual 5-6 and was very light. She is sexually active but denies she may be pregnant. Will check labs as below and f/u pending results.  - Cervicovaginal ancillary only - Beta HCG, Quant - CBC w/Diff/Platelet - Comprehensive Metabolic Panel (CMET) - TSH - HIV antibody (with reflex)  2. Vaginal discharge No new normal discharge, just same physiological discharge she feels she always as. See above medical  treatment plan. - Cervicovaginal ancillary only - CBC w/Diff/Platelet - HIV antibody (with reflex)  3. Panic attack as reaction to stress New on Monday. Does have h/o this but has not had panic attack in last 2 years. Will check labs as below and f/u pending results. Start sertraline as below. Will f/u in 4-6 weeks.  - CBC w/Diff/Platelet - Comprehensive Metabolic Panel (CMET) - TSH - sertraline (ZOLOFT) 50 MG tablet; Take 1 tablet (50 mg total) by mouth daily. Start with 1/2 tab PO q hs x 1 week then increase to 1 tab PO q hs  Dispense: 30 tablet; Refill: 3  4. Difficulty sleeping Only sleeping 3-4 hours and not restful. Has had more stress. Feels this is why she had panic attack. Will try sertraline as below. - sertraline (ZOLOFT) 50 MG tablet; Take  1 tablet (50 mg total) by mouth daily. Start with 1/2 tab PO q hs x 1 week then increase to 1 tab PO q hs  Dispense: 30 tablet; Refill: 3  5. Depression, major, single episode, moderate ([place]) See above medical treatment plan for #3 and 4.  - sertraline (ZOLOFT) 50 MG tablet; Take 1 tablet (50 mg total) by mouth daily. Start with 1/2 tab PO q hs x 1 week then increase to 1 tab PO q hs  Dispense: 30 tablet; Refill: 3   No follow-ups on file.     I discussed the assessment and treatment plan with the patient. The patient was provided an opportunity to ask questions and all were answered. The patient agreed with the plan and demonstrated an understanding of the instructions.   The patient was advised to call back or seek an in-person evaluation if the symptoms worsen or if the condition fails to improve as anticipated.  I provided 18 minutes of non-face-to-face time during this encounter.  Reynolds Bowl, PA-C, have reviewed all documentation for this visit. The documentation on 10/13/19 for the exam, diagnosis, procedures, and orders are all accurate and complete.  [place] [facility] [phone]  (phone) [phone] (fax)  [place]

## 2019-10-13 NOTE — Patient Instructions (Signed)
Sertraline tablets What is this medicine? SERTRALINE (SER tra leen) is used to treat depression. It may also be used to treat obsessive compulsive disorder, panic disorder, post-trauma stress, premenstrual dysphoric disorder (PMDD) or social anxiety. This medicine may be used for other purposes; ask your health care provider or pharmacist if you have questions. COMMON BRAND NAME(S): Zoloft What should I tell my health care provider before I take this medicine? They need to know if you have any of these conditions:  bleeding disorders  bipolar disorder or a family history of bipolar disorder  glaucoma  heart disease  high blood pressure  history of irregular heartbeat  history of low levels of calcium, magnesium, or potassium in the blood  if you often drink alcohol  liver disease  receiving electroconvulsive therapy  seizures  suicidal thoughts, plans, or attempt; a previous suicide attempt by you or a family member  take medicines that treat or prevent blood clots  thyroid disease  an unusual or allergic reaction to sertraline, other medicines, foods, dyes, or preservatives  pregnant or trying to get pregnant  breast-feeding How should I use this medicine? Take this medicine by mouth with a glass of water. Follow the directions on the prescription label. You can take it with or without food. Take your medicine at regular intervals. Do not take your medicine more often than directed. Do not stop taking this medicine suddenly except upon the advice of your doctor. Stopping this medicine too quickly may cause serious side effects or your condition may worsen. A special MedGuide will be given to you by the pharmacist with each prescription and refill. Be sure to read this information carefully each time. Talk to your pediatrician regarding the use of this medicine in children. While this drug may be prescribed for children as young as 7 years for selected conditions,  precautions do apply. Overdosage: If you think you have taken too much of this medicine contact a poison control center or emergency room at once. NOTE: This medicine is only for you. Do not share this medicine with others. What if I miss a dose? If you miss a dose, take it as soon as you can. If it is almost time for your next dose, take only that dose. Do not take double or extra doses. What may interact with this medicine? Do not take this medicine with any of the following medications:  cisapride  dronedarone  linezolid  MAOIs like Carbex, Eldepryl, Marplan, Nardil, and Parnate  methylene blue (injected into a vein)  pimozide  thioridazine This medicine may also interact with the following medications:  alcohol  amphetamines  aspirin and aspirin-like medicines  certain medicines for depression, anxiety, or psychotic disturbances  certain medicines for fungal infections like ketoconazole, fluconazole, posaconazole, and itraconazole  certain medicines for irregular heart beat like flecainide, quinidine, propafenone  certain medicines for migraine headaches like almotriptan, eletriptan, frovatriptan, naratriptan, rizatriptan, sumatriptan, zolmitriptan  certain medicines for sleep  certain medicines for seizures like carbamazepine, valproic acid, phenytoin  certain medicines that treat or prevent blood clots like warfarin, enoxaparin, dalteparin  cimetidine  digoxin  diuretics  fentanyl  isoniazid  lithium  NSAIDs, medicines for pain and inflammation, like ibuprofen or naproxen  other medicines that prolong the QT interval (cause an abnormal heart rhythm) like dofetilide  rasagiline  safinamide  supplements like St. John's wort, kava kava, valerian  tolbutamide  tramadol  tryptophan This list may not describe all possible interactions. Give your health care provider  a list of all the medicines, herbs, non-prescription drugs, or dietary supplements  you use. Also tell them if you smoke, drink alcohol, or use illegal drugs. Some items may interact with your medicine. What should I watch for while using this medicine? Tell your doctor if your symptoms do not get better or if they get worse. Visit your doctor or health care professional for regular checks on your progress. Because it may take several weeks to see the full effects of this medicine, it is important to continue your treatment as prescribed by your doctor. Patients and their families should watch out for new or worsening thoughts of suicide or depression. Also watch out for sudden changes in feelings such as feeling anxious, agitated, panicky, irritable, hostile, aggressive, impulsive, severely restless, overly excited and hyperactive, or not being able to sleep. If this happens, especially at the beginning of treatment or after a change in dose, call your health care professional. You may get drowsy or dizzy. Do not drive, use machinery, or do anything that needs mental alertness until you know how this medicine affects you. Do not stand or sit up quickly, especially if you are an older patient. This reduces the risk of dizzy or fainting spells. Alcohol may interfere with the effect of this medicine. Avoid alcoholic drinks. Your mouth may get dry. Chewing sugarless gum or sucking hard candy, and drinking plenty of water may help. Contact your doctor if the problem does not go away or is severe. What side effects may I notice from receiving this medicine? Side effects that you should report to your doctor or health care professional as soon as possible:  allergic reactions like skin rash, itching or hives, swelling of the face, lips, or tongue  anxious  black, tarry stools  changes in vision  confusion  elevated mood, decreased need for sleep, racing thoughts, impulsive behavior  eye pain  fast, irregular heartbeat  feeling faint or lightheaded, falls  feeling agitated,  angry, or irritable  hallucination, loss of contact with reality  loss of balance or coordination  loss of memory  painful or prolonged erections  restlessness, pacing, inability to keep still  seizures  stiff muscles  suicidal thoughts or other mood changes  trouble sleeping  unusual bleeding or bruising  unusually weak or tired  vomiting Side effects that usually do not require medical attention (report to your doctor or health care professional if they continue or are bothersome):  change in appetite or weight  change in sex drive or performance  diarrhea  increased sweating  indigestion, nausea  tremors This list may not describe all possible side effects. Call your doctor for medical advice about side effects. You may report side effects to FDA at 1-800-FDA-1088. Where should I keep my medicine? Keep out of the reach of children. Store at room temperature between 15 and 30 degrees C (59 and 86 degrees F). Throw away any unused medicine after the expiration date. NOTE: This sheet is a summary. It may not cover all possible information. If you have questions about this medicine, talk to your doctor, pharmacist, or health care provider.  2020 Elsevier/Gold Standard (2018-04-13 10:09:27)  

## 2019-10-15 ENCOUNTER — Encounter: Payer: Self-pay | Admitting: Physician Assistant

## 2019-10-15 LAB — CBC WITH DIFFERENTIAL/PLATELET
Basophils Absolute: 0 10*3/uL (ref 0.0–0.2)
Basos: 1 %
EOS (ABSOLUTE): 0 10*3/uL (ref 0.0–0.4)
Eos: 1 %
Hematocrit: 40.2 % (ref 34.0–46.6)
Hemoglobin: 14.1 g/dL (ref 11.1–15.9)
Immature Grans (Abs): 0 10*3/uL (ref 0.0–0.1)
Immature Granulocytes: 0 %
Lymphocytes Absolute: 2.1 10*3/uL (ref 0.7–3.1)
Lymphs: 33 %
MCH: 32.7 pg (ref 26.6–33.0)
MCHC: 35.1 g/dL (ref 31.5–35.7)
MCV: 93 fL (ref 79–97)
Monocytes Absolute: 0.6 10*3/uL (ref 0.1–0.9)
Monocytes: 9 %
Neutrophils Absolute: 3.6 10*3/uL (ref 1.4–7.0)
Neutrophils: 56 %
Platelets: 267 10*3/uL (ref 150–450)
RBC: 4.31 x10E6/uL (ref 3.77–5.28)
RDW: 11.8 % (ref 11.7–15.4)
WBC: 6.4 10*3/uL (ref 3.4–10.8)

## 2019-10-15 LAB — COMPREHENSIVE METABOLIC PANEL
ALT: 12 IU/L (ref 0–32)
AST: 23 IU/L (ref 0–40)
Albumin/Globulin Ratio: 1.7 (ref 1.2–2.2)
Albumin: 4.6 g/dL (ref 3.9–5.0)
Alkaline Phosphatase: 84 IU/L (ref 48–121)
BUN/Creatinine Ratio: 15 (ref 9–23)
BUN: 14 mg/dL (ref 6–20)
Bilirubin Total: 1.5 mg/dL — ABNORMAL HIGH (ref 0.0–1.2)
CO2: 21 mmol/L (ref 20–29)
Calcium: 9.8 mg/dL (ref 8.7–10.2)
Chloride: 103 mmol/L (ref 96–106)
Creatinine, Ser: 0.93 mg/dL (ref 0.57–1.00)
GFR calc Af Amer: 99 mL/min/{1.73_m2} (ref >59)
GFR calc non Af Amer: 86 mL/min/{1.73_m2} (ref >59)
Globulin, Total: 2.7 g/dL (ref 1.5–4.5)
Glucose: 67 mg/dL (ref 65–99)
Potassium: 3.8 mmol/L (ref 3.5–5.2)
Sodium: 141 mmol/L (ref 134–144)
Total Protein: 7.3 g/dL (ref 6.0–8.5)

## 2019-10-15 LAB — TSH: TSH: 0.65 u[IU]/mL (ref 0.450–4.500)

## 2019-10-15 LAB — HIV ANTIBODY (ROUTINE TESTING W REFLEX): HIV Screen 4th Generation wRfx: NONREACTIVE

## 2019-10-15 LAB — BETA HCG QUANT (REF LAB): hCG Quant: <1 m[IU]/mL

## 2019-10-17 ENCOUNTER — Telehealth: Payer: Self-pay

## 2019-10-17 NOTE — Telephone Encounter (Signed)
LMTCB-If patient calls back ok for P[facility] nurse to give results. °

## 2019-10-17 NOTE — Telephone Encounter (Signed)
-----   Message from Mar Daring, [place] sent at 10/17/2019 12:57 PM EDT ----- Pregnancy is negative. Blood count is normal. Kidney and liver function are normal. Sodium, potassium and calcium are normal. Sugar is normal. Thyroid is normal. HIV screen is negative. Vaginal swab is pending.   Can print records from your mychart if copies needed.

## 2019-10-17 NOTE — Telephone Encounter (Signed)
See result notes. 

## 2019-10-17 NOTE — Telephone Encounter (Signed)
Result note read to patient; verbalizes understanding.

## 2019-10-18 ENCOUNTER — Telehealth: Payer: Self-pay

## 2019-10-18 ENCOUNTER — Encounter: Payer: Self-pay | Admitting: Physician Assistant

## 2019-10-18 LAB — CERVICOVAGINAL ANCILLARY ONLY
Bacterial Vaginitis (gardnerella): POSITIVE — AB
Candida Glabrata: NEGATIVE
Candida Vaginitis: NEGATIVE
Chlamydia: NEGATIVE
Comment: NEGATIVE
Comment: NEGATIVE
Comment: NEGATIVE
Comment: NEGATIVE
Comment: NEGATIVE
Comment: NORMAL
Neisseria Gonorrhea: NEGATIVE
Trichomonas: NEGATIVE

## 2019-10-18 MED ORDER — METRONIDAZOLE 500 MG PO TABS: 500.0000 mg | ORAL_TABLET | Freq: Two times a day (BID) | ORAL | 0 refills | Status: DC

## 2019-10-18 NOTE — Telephone Encounter (Signed)
-----   Message from Mar Daring, [place] sent at 10/18/2019 11:42 AM EDT ----- Gonorrhea, chlamydia, trich and yeast are negative. Swab was positive for Bacterial vaginosis. Will send in metronidazole to treat.

## 2019-10-18 NOTE — Telephone Encounter (Signed)
LMTCB-if patient calls back ok for P[facility] nurse to give results °

## 2019-10-18 NOTE — Addendum Note (Signed)
Addended by: Mar Daring on: 10/18/2019 11:43 AM   Modules accepted: Orders

## 2019-10-18 NOTE — Telephone Encounter (Signed)
Called and left VM to return call to office for lab result.

## 2019-10-19 NOTE — Telephone Encounter (Signed)
MyChart results reviewed by patient on 10/18/19.

## 2019-10-22 ENCOUNTER — Encounter: Payer: Self-pay | Admitting: Physician Assistant

## 2019-11-17 ENCOUNTER — Other Ambulatory Visit: Payer: Self-pay

## 2019-11-17 ENCOUNTER — Telehealth: Payer: BC Managed Care – PPO | Admitting: Physician Assistant

## 2019-11-17 ENCOUNTER — Telehealth: Payer: BC Managed Care – PPO

## 2019-11-17 DIAGNOSIS — N941 Unspecified dyspareunia: Secondary | ICD-10-CM

## 2019-11-17 DIAGNOSIS — N898 Other specified noninflammatory disorders of vagina: Secondary | ICD-10-CM

## 2019-11-17 NOTE — Progress Notes (Signed)
Based on what you shared with me, I feel your condition warrants further evaluation and I recommend that you be seen for a face to face office visit. Your symptoms may be indicative of an infection contracted due to intercourse and in order to be evaluated for this you will need to have an in person exam and pelvic exam.   NOTE: If you entered your credit card information for this eVisit, you will not be charged. You may see a "hold" on your card for the $35 but that hold will drop off and you will not have a charge processed.   If you are having a true medical emergency please call 911.      For an urgent face to face visit, [place] has five urgent care centers for your convenience:      NEW:  [facility] [place] at [place] Get Driving Directions [phone] [address] [place], [place] [address] . 10 am - 6pm Monday - Friday    [place]) Get Driving Directions [phone] [address] [place], [place] [address] . 10 am to 8 pm Monday-Friday . 12 pm to 8 pm [facility] at [facility] Get Driving Directions [phone] [place], [place], [place] [address] . 8 am to 8 pm Monday-Friday . 9 am to 6 pm Saturday . 11 am to 6 pm Sunday     [facility] at [facility] Get Driving Directions  [phone] [address] [place], [place] [address] . 8 am to 8 pm Monday-Friday . 8 am to 4 pm [facility] at [address] Get Driving Directions [phone] [address] [place], [place] [address] . 12 pm to 6 pm Monday-Friday      Your e-visit answers were reviewed by a board certified advanced clinical practitioner to complete your personal care plan.  Thank you for using e-Visits.   Approximately 5 minutes was spent documenting and reviewing patient's chart.

## 2019-12-20 ENCOUNTER — Other Ambulatory Visit: Payer: BC Managed Care – PPO

## 2019-12-20 ENCOUNTER — Other Ambulatory Visit: Payer: Self-pay

## 2019-12-20 DIAGNOSIS — Z20822 Contact with and (suspected) exposure to covid-19: Secondary | ICD-10-CM

## 2019-12-21 LAB — NOVEL CORONAVIRUS, NAA: SARS-CoV-2, NAA: NOT DETECTED

## 2019-12-21 LAB — SARS-COV-2, NAA 2 DAY TAT

## 2019-12-23 ENCOUNTER — Other Ambulatory Visit: Payer: Self-pay

## 2019-12-23 ENCOUNTER — Ambulatory Visit (INDEPENDENT_AMBULATORY_CARE_PROVIDER_SITE_OTHER): Payer: BC Managed Care – PPO | Admitting: Physician Assistant

## 2019-12-23 ENCOUNTER — Other Ambulatory Visit (HOSPITAL_COMMUNITY)
Admission: RE | Admit: 2019-12-23 | Discharge: 2019-12-23 | Disposition: A | Discharge status: Home / Self Care | Payer: BC Managed Care – PPO | Source: Ambulatory Visit | Attending: Physician Assistant | Admitting: Physician Assistant

## 2019-12-23 ENCOUNTER — Encounter: Payer: Self-pay | Admitting: Physician Assistant

## 2019-12-23 VITALS — BP 124/86 | HR 85 | Temp 98.4°F | Wt 141.4 lb

## 2019-12-23 DIAGNOSIS — Z202 Contact with and (suspected) exposure to infections with a predominantly sexual mode of transmission: Secondary | ICD-10-CM

## 2019-12-23 DIAGNOSIS — N938 Other specified abnormal uterine and vaginal bleeding: Secondary | ICD-10-CM | POA: Diagnosis not present

## 2019-12-23 NOTE — Patient Instructions (Signed)
Cold Sore  A cold sore, also called a fever blister, is a small, fluid-filled sore that forms inside of the mouth or on the lips, gums, nose, chin, or cheeks. Cold sores can spread to other parts of the body, such as the eyes or fingers. Cold sores can spread from person to person (are contagious) until the sores crust over completely. Most cold sores go away within 2 weeks. What are the causes? Cold sores are caused by a virus (herpes simplex virus type 1, HSV-1). The virus can spread from person to person through close contact, such as through:  Kissing.  Touching the affected area.  Sharing personal items such as lip balm, razors, a drinking glass, or eating utensils. What increases the risk? You are more likely to develop this condition if you:  Are tired, stressed, or sick.  Are having your period (menstruating).  Are pregnant.  Take certain medicines.  Are out in cold weather or get too much sun. What are the signs or symptoms? Symptoms of a cold sore outbreak go through different stages. These are the stages of a cold sore:  Tingling, itching, or burning is felt 1-2 days before the outbreak.  Fluid-filled blisters appear on the lips, inside the mouth, on the nose, or on the cheeks.  The blisters start to ooze clear fluid.  The blisters dry up, and a yellow crust appears in their place.  The crust falls off. In some cases, other symptoms can develop during a cold sore outbreak. These can include:  Fever.  Sore throat.  Headache.  Muscle aches.  Swollen neck glands. How is this treated? There is no cure for cold sores or the virus that causes them. There is also no vaccine to prevent the virus. Most cold sores go away on their own without treatment within 2 weeks. Your doctor may prescribe medicines to:  Help with pain.  Keep the virus from growing.  Help you heal faster. Medicines may be in the form of creams, gels, pills, or a shot. Follow these  instructions at home: Medicines  Take or apply over-the-counter and prescription medicines only as told by your doctor.  Use a cotton-tip swab to apply creams or gels to your sores.  Ask your doctor if you can take lysine supplements. These may help with healing. Sore care   Do not touch the sores or pick the scabs.  Wash your hands often. Do not touch your eyes without washing your hands first.  Keep the sores clean and dry.  If told, put ice on the sores: ? Put ice in a plastic bag. ? Place a towel between your skin and the bag. ? Leave the ice on for 20 minutes, 2-3 times a day. Eating and drinking  Eat a soft, bland diet. Avoid eating hot, cold, or salty foods. These can hurt your mouth.  Use a straw if it hurts to drink out of a glass.  Eat foods that have a lot of lysine in them. These include meat, fish, and dairy products.  Avoid sugary foods, chocolates, nuts, and grains. These foods have a high amount of a substance (arginine) that can cause the virus to grow. Lifestyle  Do not kiss, have oral sex, or share personal items until your sores heal.  Stress, poor sleep, and being out in the sun can trigger outbreaks. Make sure you: ? Do activities that help you relax, such as deep breathing exercises or meditation. ? Get enough sleep. ? Apply sunscreen   on your lips before you go out in the sun. Contact a doctor if:  You have symptoms for more than 2 weeks.  You have pus coming from the sores.  You have redness that is spreading.  You have pain or irritation in your eye.  You get sores on your genitals.  Your sores do not heal within 2 weeks.  You get cold sores often. Get help right away if:  You have a fever and your symptoms suddenly get worse.  You have a headache and confusion.  You have tiredness (fatigue).  You do not want to eat as much as normal (loss of appetite).  You have a stiff neck or are sensitive to light. Summary  A cold sore is  a small, fluid-filled sore that forms inside of the mouth or on the lips, gums, nose, chin, or cheeks.  Cold sores can spread from person to person (are contagious) until the sores crust over completely. Most cold sores go away within 2 weeks.  Wash your hands often. Do not touch your eyes without washing your hands first.  Do not kiss, have oral sex, or share personal items until your sores heal.  Contact a doctor if your sores do not heal within 2 weeks. This information is not intended to replace advice given to you by your health care provider. Make sure you discuss any questions you have with your health care provider. Document Revised: 08/11/2018 Document Reviewed: 09/21/2017 Elsevier Patient Education  2020 Elsevier Inc.  

## 2019-12-23 NOTE — Progress Notes (Signed)
     Established patient visit   Patient: Kelly Lane   DOB: 1996/03/09   24 y.o. Female  MRN: [id number] Visit Date: 12/23/2019  Today's healthcare provider: Trinna Post, PA-C   Chief Complaint  Patient presents with  . Exposure to STD   Subjective    HPI   Requests STD testing. She has an ulcer on her mouth and thinks it might be herpes. She wants a blood test for herpes. She is also concerned a spot on her groin is herpes. She does have a history of STI.   She also brings up concern of abnormal uterine bleeding. Patient was evaluated for this issue in 05/5174 with Kelly Putt Copland, PA-C [facility]BYGN.      Medications: Outpatient Medications Prior to Visit  Medication Sig  . Multiple Vitamins-Minerals (MULTIPLE VITAMINS/WOMENS PO) Take by mouth daily.  . sertraline (ZOLOFT) 50 MG tablet Take 1 tablet (50 mg total) by mouth daily. Start with 1/2 tab PO q hs x 1 week then increase to 1 tab PO q hs  . metroNIDAZOLE (FLAGYL) 500 MG tablet Take 1 tablet (500 mg total) by mouth 2 (two) times daily. (Patient not taking: Reported on 12/23/2019)   No facility-administered medications prior to visit.    Review of Systems    Objective    BP 124/86 (BP Location: Right Arm, Patient Position: Sitting, Cuff Size: Normal)   Pulse 85   Temp 98.4 F (36.9 C) (Oral)   Wt 141 lb 6.4 oz (64.1 kg)   LMP 12/16/2019 (Exact Date)   SpO2 96%   BMI 25.86 kg/m    Physical Exam Constitutional:      Appearance: Normal appearance.  HENT:     Mouth/Throat:   Cardiovascular:     Rate and Rhythm: Normal rate and regular rhythm.     Heart sounds: Normal heart sounds.  Pulmonary:     Effort: Pulmonary effort is normal.     Breath sounds: Normal breath sounds.  Genitourinary:   Skin:    General: Skin is warm and dry.  Neurological:     Mental Status: She is alert and oriented to person, place, and time. Mental status is at baseline.  Psychiatric:        Mood and Affect: Mood normal.          Behavior: Behavior normal.       No results found for any visits on 12/23/19.  Assessment & Plan    1. STD exposure  Counseled serology is not accurate or helpful in diagnosing HSV, we would need viral culture. Unfortunatley we do not have viral uclture in the clinic today. Offere patient to come back and get lesion swab when we do have the culture however advised these do not look like HSV lesions. Patient declines at this time but would like other routine testing.   - Ambulatory referral to Obstetrics / Gynecology - Urine cytology ancillary only  2. Dysfunctional uterine bleeding  Follow up with GYN.    No follow-ups on file.      ITrinna Post, PA-C, have reviewed all documentation for this visit. The documentation on 01/03/20 for the exam, diagnosis, procedures, and orders are all accurate and complete.    Kelly Lane  [facility] [phone] (phone) [phone] (fax)  [place]

## 2019-12-27 LAB — URINE CYTOLOGY ANCILLARY ONLY
Bacterial Vaginitis-Urine: NEGATIVE
Candida Urine: NEGATIVE
Chlamydia: POSITIVE — AB
Comment: NEGATIVE
Comment: NEGATIVE
Comment: NORMAL
Neisseria Gonorrhea: NEGATIVE
Trichomonas: NEGATIVE

## 2019-12-28 ENCOUNTER — Telehealth: Payer: Self-pay | Admitting: Physician Assistant

## 2019-12-28 ENCOUNTER — Telehealth: Payer: Self-pay

## 2019-12-28 DIAGNOSIS — A749 Chlamydial infection, unspecified: Secondary | ICD-10-CM

## 2019-12-28 MED ORDER — AZITHROMYCIN 500 MG PO TABS: 1000.0000 mg | ORAL_TABLET | Freq: Once | ORAL | 0 refills | Status: AC

## 2019-12-28 NOTE — Telephone Encounter (Signed)
Patient is positive for chlamydia. Please send form to health department.  Azithromycin 1g to be taken once will be sent to pharmacy.  Patient can schedule 2 week f/u for clearance check if she desires.

## 2019-12-28 NOTE — Telephone Encounter (Signed)
Pt called stating she had reviewed results on MyChart( Chlamydia) but questioning why she had not been called with results.  Also questioning the meaning of "Reference range negative."   Reviewed meaning of reference range, verbalized understanding.  Pt stated called two times since 10am and again questioning why she was not called "Even though I saw those on MyChart, feel like I should have been called."   Reviewed timeline with patient in regards to messages/calls. Advised NT would alert practice with her concerns.  CB# [phone]

## 2019-12-28 NOTE — Telephone Encounter (Signed)
Left detailed message about antibiotic sent to [facility] in [place].

## 2019-12-28 NOTE — Telephone Encounter (Signed)
Patient viewed most recent lab results and states she would like a Rx sent in as soon as possible for STD postive results. Patient would like a follow up please advise   [id number] [address], [place] - [address] Phone:  [phone]  Fax:  [phone]

## 2019-12-28 NOTE — Telephone Encounter (Signed)
Copied from [place] [phone]. Topic: General - Inquiry >> Dec 28, 2019 11:52 AM Alease Frame wrote: Reason for CRM: Pt caled in stating she received test results which were positive and she needed to speak with [facility] nurse . Please advise

## 2019-12-29 NOTE — Telephone Encounter (Signed)
Left detailed message for patient to call back to schedule appt.

## 2020-01-10 ENCOUNTER — Other Ambulatory Visit: Payer: BC Managed Care – PPO

## 2020-01-12 NOTE — Progress Notes (Signed)
Established patient visit   Patient: Kelly Lane   DOB: 06-Apr-1996   24 y.o. Female  MRN: [id number] Visit Date: 01/13/2020  Today's healthcare provider: Trinna Post, PA-C   Chief Complaint  Patient presents with  . Follow-up  I,Gabino Hagin M Arad Burston,acting as a scribe for Trinna Post, PA-C.,have documented all relevant documentation on the behalf of Trinna Post, PA-C,as directed by  Trinna Post, PA-C while in the presence of Trinna Post, PA-C.  Subjective    HPI  Follow up for STI recheck  The patient was last seen for this 3 weeks ago. Patient tested positive for UTI. She took treatment without issue.  She reports good compliance with treatment. She feels that condition is Improved. She is not having side effects.   Today patient denies pelvic pain, discharge, fever, vaginal bleeding. She reports inconsistent condom use. She does not want to have a child right now. She does not use birth control.  -----------------------------------------------------------------------------------------       Medications: Outpatient Medications Prior to Visit  Medication Sig  . Multiple Vitamins-Minerals (MULTIPLE VITAMINS/WOMENS PO) Take by mouth daily.  . sertraline (ZOLOFT) 50 MG tablet Take 1 tablet (50 mg total) by mouth daily. Start with 1/2 tab PO q hs x 1 week then increase to 1 tab PO q hs  . metroNIDAZOLE (FLAGYL) 500 MG tablet Take 1 tablet (500 mg total) by mouth 2 (two) times daily. (Patient not taking: Reported on 12/23/2019)   No facility-administered medications prior to visit.    Review of Systems  Constitutional: Negative.   Gastrointestinal: Negative.   Genitourinary: Negative.   Musculoskeletal: Negative.       Objective    BP 116/72 (BP Location: Right Arm, Patient Position: Sitting, Cuff Size: Normal)   Pulse 83   Temp 98.5 F (36.9 C) (Oral)   Wt 142 lb 12.8 oz (64.8 kg)   LMP 12/13/2019   SpO2 97%   BMI 26.12 kg/m     Physical Exam Exam conducted with a chaperone present.  Constitutional:      Appearance: Normal appearance.  Genitourinary:    General: Normal vulva.     Vagina: Vaginal discharge present.     Cervix: No cervical motion tenderness or friability.     Comments: Vaginal odor.  Skin:    General: Skin is warm and dry.  Neurological:     General: No focal deficit present.     Mental Status: She is alert and oriented to person, place, and time.  Psychiatric:        Mood and Affect: Mood normal.        Behavior: Behavior normal.       No results found for any visits on 01/13/20.  Assessment & Plan    1. Chlamydia  - Cervicovaginal ancillary only  2. Cervical cancer screening  - Cytology - PAP  3. Unprotected sex  Patient declines birth control. Patient does not desire to have a child right now. Patient uses condoms inconsistently. Counseled patient that unless she wants to be pregnant, she should be using condoms with every sexual encounter or be on birth control.     Return if symptoms worsen or fail to improve.      ITrinna Post, PA-C, have reviewed all documentation for this visit. The documentation on 01/13/20 for the exam, diagnosis, procedures, and orders are all accurate and complete.  The entirety of the information documented in the History of  Present Illness, Review of Systems and Physical Exam were personally obtained by me. Portions of this information were initially documented by [facility] and reviewed by me for thoroughness and accuracy.   I spent 20 minutes dedicated to the care of this patient on the date of this encounter to include pre-visit review of records, face-to-face time with the patient discussing pregnancy prevention, and post visit ordering of testing.   Paulene Floor  [facility] [phone] (phone) [phone] (fax)  [place]

## 2020-01-13 ENCOUNTER — Ambulatory Visit (INDEPENDENT_AMBULATORY_CARE_PROVIDER_SITE_OTHER): Payer: BC Managed Care – PPO | Admitting: Physician Assistant

## 2020-01-13 ENCOUNTER — Other Ambulatory Visit (HOSPITAL_COMMUNITY)
Admission: RE | Admit: 2020-01-13 | Discharge: 2020-01-13 | Disposition: A | Discharge status: Home / Self Care | Payer: BC Managed Care – PPO | Source: Ambulatory Visit | Attending: Physician Assistant | Admitting: Physician Assistant

## 2020-01-13 ENCOUNTER — Other Ambulatory Visit: Payer: Self-pay

## 2020-01-13 ENCOUNTER — Encounter: Payer: Self-pay | Admitting: Physician Assistant

## 2020-01-13 VITALS — BP 116/72 | HR 83 | Temp 98.5°F | Wt 142.8 lb

## 2020-01-13 DIAGNOSIS — Z124 Encounter for screening for malignant neoplasm of cervix: Secondary | ICD-10-CM | POA: Diagnosis not present

## 2020-01-13 DIAGNOSIS — A749 Chlamydial infection, unspecified: Secondary | ICD-10-CM | POA: Diagnosis not present

## 2020-01-13 DIAGNOSIS — Z7251 High risk heterosexual behavior: Secondary | ICD-10-CM

## 2020-01-16 LAB — CERVICOVAGINAL ANCILLARY ONLY
Bacterial Vaginitis (gardnerella): NEGATIVE
Candida Glabrata: NEGATIVE
Candida Vaginitis: NEGATIVE
Chlamydia: NEGATIVE
Comment: NEGATIVE
Comment: NEGATIVE
Comment: NEGATIVE
Comment: NEGATIVE
Comment: NEGATIVE
Comment: NORMAL
Neisseria Gonorrhea: NEGATIVE
Trichomonas: NEGATIVE

## 2020-01-17 LAB — CYTOLOGY - PAP: Diagnosis: NEGATIVE

## 2020-01-26 ENCOUNTER — Telehealth: Payer: Self-pay | Admitting: Radiology

## 2020-01-26 NOTE — Telephone Encounter (Signed)
Left voicemail and sent mychart message to call [facility] to schedule appointment from referral for New Gyn

## 2020-04-03 ENCOUNTER — Encounter (HOSPITAL_COMMUNITY): Payer: Self-pay

## 2020-04-03 ENCOUNTER — Emergency Department (HOSPITAL_COMMUNITY)
Admission: EM | Admit: 2020-04-03 | Discharge: 2020-04-03 | Disposition: A | Discharge status: Home / Self Care | Payer: BC Managed Care – PPO | Attending: Emergency Medicine | Admitting: Emergency Medicine

## 2020-04-03 ENCOUNTER — Other Ambulatory Visit: Payer: Self-pay

## 2020-04-03 DIAGNOSIS — Z3A09 9 weeks gestation of pregnancy: Secondary | ICD-10-CM | POA: Insufficient documentation

## 2020-04-03 DIAGNOSIS — O26891 Other specified pregnancy related conditions, first trimester: Secondary | ICD-10-CM | POA: Diagnosis not present

## 2020-04-03 DIAGNOSIS — R519 Headache, unspecified: Secondary | ICD-10-CM | POA: Diagnosis not present

## 2020-04-03 MED ORDER — METOCLOPRAMIDE HCL 5 MG/ML IJ SOLN
10.0000 mg | Freq: Once | INTRAMUSCULAR | Status: AC
  Administered 2020-04-03: 10 mg via INTRAVENOUS
  Filled 2020-04-03: qty 2

## 2020-04-03 MED ORDER — SODIUM CHLORIDE 0.9 % IV BOLUS
250.0000 mL | Freq: Once | INTRAVENOUS | Status: AC
  Administered 2020-04-03: 250 mL via INTRAVENOUS

## 2020-04-03 NOTE — ED Triage Notes (Signed)
Patient arrived stating at about 5pm she began having a headache. Stating when took a nap but when she woke up her vision was disoriented but has since resolved. Patient is [redacted] weeks pregnant. Declines any OTC medication prior to arrival.

## 2020-04-03 NOTE — Discharge Instructions (Addendum)
The cause of your headache was not identified today. Drink plenty of fluids. You may drink caffeine in the morning to see if this helps your headache. You may also apply warm compresses. Please get rechecked immediately if you develop numbness, weakness or new concerning symptoms.

## 2020-04-03 NOTE — ED Provider Notes (Signed)
[place] DEPT Provider Note   CSN: [id number] Arrival date & time: 04/03/20  2111     History Chief Complaint  Patient presents with  . Headache    Kelly Lane is a 24 y.o. female.  The history is provided by the patient.  Headache  Kelly Lane is a 24 y.o. female who presents to the Emergency Department complaining of headache. She is a G2P0010 that presents the emergency department complaining of right sided headache that started gradually around 430 this afternoon. She laid down to take a nap when she woke up around six it was significantly worse. She describes it as a tension type headache. She does not routinely have headaches in her last headache that was similar was in middle school. She denies any fevers. She had transient blurred vision, which is now back at baseline. No numbness, weakness, gait difficulties. No fevers. She has an acetaminophen allergy. She is currently nine weeks pregnant by LMP and first trimester ultrasound. Symptoms are moderate in nature. No vaginal bleeding. She has a Tylenol allergy.    Past Medical History:  Diagnosis Date  . Endometriosis 07/2015  . GERD (gastroesophageal reflux disease)    NO MEDS  . Headache   . Ovarian cyst 07/2015    Patient Active Problem List   Diagnosis Date Noted  . Endometriosis 07/17/2015    Past Surgical History:  Procedure Laterality Date  . LAPAROSCOPIC OVARIAN CYSTECTOMY Right 07/17/2015   Procedure: EXCISION OF ENDOMETRIOSIS;  Surgeon: Gae Dry, MD;  Location: [facility] ORS;  Service: Gynecology;  Laterality: Right;  . LAPAROSCOPY N/A 07/17/2015   Procedure: LAPAROSCOPY OPERATIVE;  Surgeon: Gae Dry, MD;  Location: [facility] ORS;  Service: Gynecology;  Laterality: N/A;  . NO PAST SURGERIES       OB History    Gravida  1   Para  0   Term  0   Preterm  0   AB  0   Living  0     SAB  0   TAB  0   Ectopic  0   Multiple  0   Live Births  0            Family History  Problem Relation Age of Onset  . Hypertension Mother     Social History   Tobacco Use  . Smoking status: Never Smoker  . Smokeless tobacco: Never Used  Vaping Use  . Vaping Use: Never used  Substance Use Topics  . Alcohol use: Yes    Comment: every six months maybe 1 drink  . Drug use: No    Home Medications Prior to Admission medications   Medication Sig Start Date End Date Taking? Authorizing Provider  Prenatal Vit-Fe Fumarate-FA (PRENATAL MULTIVITAMIN) TABS tablet Take 1 tablet by mouth daily at 12 noon.   Yes [provider]  metroNIDAZOLE (FLAGYL) 500 MG tablet Take 1 tablet (500 mg total) by mouth 2 (two) times daily. Patient not taking: Reported on 12/23/2019 10/18/19   Mar Daring, PA-C  sertraline (ZOLOFT) 50 MG tablet Take 1 tablet (50 mg total) by mouth daily. Start with 1/2 tab PO q hs x 1 week then increase to 1 tab PO q hs Patient not taking: Reported on 04/03/2020 10/13/19   Mar Daring, PA-C    Allergies    Patient has no known allergies.  Review of Systems   Review of Systems  Neurological: Positive for headaches.  All other systems  reviewed and are negative.   Physical Exam Updated Vital Signs BP (!) 132/99 (BP Location: Left Arm)   Pulse 95   Temp 98.3 F (36.8 C) (Oral)   Resp 16   LMP 01/28/2020   SpO2 100%   Physical Exam Vitals and nursing note reviewed.  Constitutional:      Appearance: She is well-developed.  HENT:     Head: Normocephalic and atraumatic.     Mouth/Throat:     Mouth: Mucous membranes are moist.  Eyes:     Extraocular Movements: Extraocular movements intact.     Pupils: Pupils are equal, round, and reactive to light.  Cardiovascular:     Rate and Rhythm: Normal rate and regular rhythm.     Heart sounds: No murmur heard.   Pulmonary:     Effort: Pulmonary effort is normal. No respiratory distress.     Breath sounds: Normal breath sounds.  Abdominal:     Palpations:  Abdomen is soft.     Tenderness: There is no abdominal tenderness. There is no guarding or rebound.  Musculoskeletal:        General: No tenderness.     Cervical back: Neck supple.  Skin:    General: Skin is warm and dry.  Neurological:     Mental Status: She is alert and oriented to person, place, and time.     Comments: No asymmetry of facial movements. Five out of five strength in all four extremities with sensation light touch intact in all four extremities. Visual fields grossly intact.  Psychiatric:        Behavior: Behavior normal.     ED Results / Procedures / Treatments   Labs (all labs ordered are listed, but only abnormal results are displayed) Labs Reviewed - No data to display  EKG None  Radiology No results found.  Procedures Procedures (including critical care time)  Medications Ordered in ED Medications  metoCLOPramide (REGLAN) injection 10 mg (10 mg Intravenous Given 04/03/20 2214)  sodium chloride 0.9 % bolus 250 mL (250 mLs Intravenous New Bag/Given 04/03/20 2214)    ED Course  I have reviewed the triage vital signs and the nursing notes.  Pertinent labs & imaging results that were available during my care of the patient were reviewed by me and considered in my medical decision making (see chart for details).    MDM Rules/Calculators/[name]                         Patient here for evaluation of right sided headache that she describes as a tension headache. She is non-toxic appearing on evaluation with no focal neurologic deficits. Presentation is not consistent with subarachnoid hemorrhage, meningitis, dural sinus thrombosis. Headache is partially improved on recheck. Discussed with patient home care for headache. Discussed outpatient follow-up and return precautions.  Final Clinical Impression(s) / ED Diagnoses Final diagnoses:  Bad headache    Rx / DC Orders ED Discharge Orders    None       Quintella Reichert, MD 04/03/20 2312

## 2020-04-14 ENCOUNTER — Encounter (HOSPITAL_COMMUNITY): Payer: Self-pay

## 2020-04-14 ENCOUNTER — Other Ambulatory Visit: Payer: Self-pay

## 2020-04-14 ENCOUNTER — Emergency Department (HOSPITAL_COMMUNITY)
Admission: EM | Admit: 2020-04-14 | Discharge: 2020-04-14 | Disposition: A | Discharge status: Home / Self Care | Payer: BC Managed Care – PPO | Attending: Emergency Medicine | Admitting: Emergency Medicine

## 2020-04-14 ENCOUNTER — Emergency Department (HOSPITAL_COMMUNITY): Payer: BC Managed Care – PPO

## 2020-04-14 DIAGNOSIS — Z3A11 11 weeks gestation of pregnancy: Secondary | ICD-10-CM | POA: Diagnosis not present

## 2020-04-14 DIAGNOSIS — O9A211 Injury, poisoning and certain other consequences of external causes complicating pregnancy, first trimester: Secondary | ICD-10-CM | POA: Insufficient documentation

## 2020-04-14 DIAGNOSIS — K219 Gastro-esophageal reflux disease without esophagitis: Secondary | ICD-10-CM | POA: Insufficient documentation
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
## 2020-04-14 NOTE — ED Notes (Signed)
US at bedside
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
## 2020-05-01 ENCOUNTER — Telehealth: Payer: BC Managed Care – PPO | Admitting: Physician Assistant

## 2020-05-01 ENCOUNTER — Other Ambulatory Visit: Payer: Self-pay

## 2020-05-01 ENCOUNTER — Ambulatory Visit: Payer: Self-pay

## 2020-05-01 ENCOUNTER — Encounter (HOSPITAL_COMMUNITY): Payer: Self-pay

## 2020-05-01 DIAGNOSIS — O98511 Other viral diseases complicating pregnancy, first trimester: Secondary | ICD-10-CM | POA: Diagnosis present

## 2020-05-01 DIAGNOSIS — Z3A14 14 weeks gestation of pregnancy: Secondary | ICD-10-CM | POA: Insufficient documentation

## 2020-05-01 DIAGNOSIS — Z20822 Contact with and (suspected) exposure to covid-19: Secondary | ICD-10-CM

## 2020-05-01 DIAGNOSIS — U071 COVID-19: Secondary | ICD-10-CM | POA: Insufficient documentation

## 2020-05-01 LAB — COMPREHENSIVE METABOLIC PANEL
ALT: 38 U/L (ref 0–44)
AST: 48 U/L — ABNORMAL HIGH (ref 15–41)
Albumin: 3.8 g/dL (ref 3.5–5.0)
Alkaline Phosphatase: 54 U/L (ref 38–126)
Anion gap: 10 (ref 5–15)
BUN: 5 mg/dL — ABNORMAL LOW (ref 6–20)
CO2: 21 mmol/L — ABNORMAL LOW (ref 22–32)
Calcium: 9.2 mg/dL (ref 8.9–10.3)
Chloride: 103 mmol/L (ref 98–111)
Creatinine, Ser: 0.68 mg/dL (ref 0.44–1.00)
GFR, Estimated: >60 mL/min (ref >60)
Glucose, Bld: 92 mg/dL (ref 70–99)
Potassium: 3.4 mmol/L — ABNORMAL LOW (ref 3.5–5.1)
Sodium: 134 mmol/L — ABNORMAL LOW (ref 135–145)
Total Bilirubin: 0.8 mg/dL (ref 0.3–1.2)
Total Protein: 7.2 g/dL (ref 6.5–8.1)

## 2020-05-01 LAB — CBC
HCT: 37 % (ref 36.0–46.0)
Hemoglobin: 13.3 g/dL (ref 12.0–15.0)
MCH: 33.9 pg (ref 26.0–34.0)
MCHC: 35.9 g/dL (ref 30.0–36.0)
MCV: 94.4 fL (ref 80.0–100.0)
Platelets: 223 10*3/uL (ref 150–400)
RBC: 3.92 MIL/uL (ref 3.87–5.11)
RDW: 12.2 % (ref 11.5–15.5)
WBC: 9.9 10*3/uL (ref 4.0–10.5)
nRBC: 0 % (ref 0.0–0.2)

## 2020-05-01 LAB — LIPASE, BLOOD: Lipase: 28 U/L (ref 11–51)

## 2020-05-01 NOTE — ED Triage Notes (Signed)
Patient  c/o headache, nausea, and body aches since waking this AM.  Patient reports that she has sensitivity to light.

## 2020-05-01 NOTE — Telephone Encounter (Signed)
Patient called stating that she may have COVID-19 symptoms.  She states that she was around people that were sick but is not sure they had COVID-19.  She states that she is [redacted] weeks pregnant.  Her symptoms started today with body aches, headache that is like a migraine and vomiting nausea and diarrhea.  She has no cough.  She does not feel she has fever. She denies chills. She states that she has only had 2 bouts with vomiting and diarrhea today. Per protocol office was notified No appointments available so patient was asked to go to UC.  She will also call her OBGYN to report symptoms. She requests to go to ER instead of UC because of her insurance.  She was informed that I could not guarantee that ER visit would be cheaper.  She verbalized understanding.  Reason for Disposition . [1] COVID-19 infection suspected by caller or triager AND [2] mild symptoms (cough, fever, or others) AND [3] has not gotten tested yet  Answer Assessment - Initial Assessment Questions 1. COVID-19 DIAGNOSIS: "Who made your COVID-19 diagnosis?" "Was it confirmed by a positive lab test?" If not diagnosed by a HCP, ask "Are there lots of cases (community spread) where you live?" Note: See public health department website, if unsure.     unsure 2. COVID-19 EXPOSURE: "Was there any known exposure to COVID before the symptoms began?" CDC Definition of close contact: within 6 feet (2 meters) for a total of 15 minutes or more over a 24-hour period.      unsure 3. ONSET: "When did the COVID-19 symptoms start?"      Today achy migraine vomiting and diarrhea 4. WORST SYMPTOM: "What is your worst symptom?" (e.g., cough, fever, shortness of breath, muscle aches)     Headache and achyness 5. COUGH: "Do you have a cough?" If Yes, ask: "How bad is the cough?"      No 6. FEVER: "Do you have a fever?" If Yes, ask: "What is your temperature, how was it measured, and when did it start?"    Unsure  7. RESPIRATORY STATUS: "Describe your  breathing?" (e.g., shortness of breath, wheezing, unable to speak)     No problems 8. BETTER-SAME-WORSE: "Are you getting better, staying the same or getting worse compared to yesterday?"  If getting worse, ask, "In what way?"    worse 9. HIGH RISK DISEASE: "Do you have any chronic medical problems?" (e.g., asthma, heart or lung disease, weak immune system, obesity, etc.)     Pregnant [redacted] weeks 10. VACCINE: "Have you gotten the COVID-19 vaccine?" If Yes ask: "Which one, how many shots, when did you get it?"      No 11. PREGNANCY: "Is there any chance you are pregnant?" "When was your last menstrual period?"      Yes 14 weeks 12. OTHER SYMPTOMS: "Do you have any other symptoms?"  (e.g., chills, fatigue, headache, loss of smell or taste, muscle pain, sore throat; new loss of smell or taste especially support the diagnosis of COVID-19)       Muscle pain,headache, fatigue  Protocols used: CORONAVIRUS (COVID-19) DIAGNOSED OR SUSPECTED-A-AH

## 2020-05-01 NOTE — Progress Notes (Signed)
For the safety of you and your child, I recommend a face to face office visit with a health care provider. You do need to be tested for COVID and the flu.  I would recommend evaluation at one of the urgent care centers or the MAU at [facility].  Many mothers need to take medicines during their pregnancy and while nursing.  Almost all medicines pass into the breast milk in small quantities.  Most are generally considered safe for a mother to take but some medicines must be avoided.  After reviewing your E-Visit request, I recommend that you consult your OB/GYN or pediatrician for medical advice in relation to your condition and prescription medications while pregnant or breastfeeding. NOTE: If you entered your credit card information for this eVisit, you will not be charged. You may see a "hold" on your card for the $35 but that hold will drop off and you will not have a charge processed.  If you are having a true medical emergency please call 911.     For an urgent face to face visit, [place] has four urgent care centers for your convenience:    NEW:  [facility] [place] [phone] [address] [place], [place] [address] .  Monday - Friday 10 am - 6 pm     . [facility]    [phone]                  Get Driving Directions  [address] [place], [place] [address] . 10 am to 8 pm Monday-Friday . 12 pm to 8 pm Saturday-Sunday   . [facility] at [facility]  [phone]                  Get Driving Directions  [address], [address] [place], [place] [address] . 8 am to 8 pm Monday-Friday . 9 am to 6 pm Saturday . 11 am to 6 pm Sunday   . [facility] at [facility]  [phone]                  Get Driving Directions   [address] [place], [place] [address] . 8 am to 8 pm Monday-Friday . 8 am to 4 pm Saturday-Sunday    . [facility] at  [facility] Directions  [phone]  [address], [address] [place], [place] [address]  . Monday-Friday, 12 PM to 6 PM    Your e-visit answers were reviewed by a board certified advanced clinical practitioner to complete your personal care plan.  Thank you for using e-Visits.    Greater than 5 minutes, yet less than 10 minutes of time have been spent researching, coordinating, and implementing care for this patient today

## 2020-05-02 ENCOUNTER — Emergency Department (HOSPITAL_COMMUNITY)
Admission: EM | Admit: 2020-05-02 | Discharge: 2020-05-02 | Disposition: A | Discharge status: Home / Self Care | Payer: BC Managed Care – PPO | Attending: Emergency Medicine | Admitting: Emergency Medicine

## 2020-05-02 DIAGNOSIS — E860 Dehydration: Secondary | ICD-10-CM

## 2020-05-02 DIAGNOSIS — O98511 Other viral diseases complicating pregnancy, first trimester: Secondary | ICD-10-CM | POA: Diagnosis not present

## 2020-05-02 DIAGNOSIS — R519 Headache, unspecified: Secondary | ICD-10-CM

## 2020-05-02 DIAGNOSIS — U071 COVID-19: Secondary | ICD-10-CM

## 2020-05-02 LAB — URINALYSIS, ROUTINE W REFLEX MICROSCOPIC
Bilirubin Urine: NEGATIVE
Glucose, UA: NEGATIVE mg/dL
Hgb urine dipstick: NEGATIVE
Ketones, ur: 80 mg/dL — AB
Leukocytes,Ua: NEGATIVE
Nitrite: NEGATIVE
Protein, ur: 30 mg/dL — AB
Specific Gravity, Urine: 1.028 (ref 1.005–1.030)
pH: 5 (ref 5.0–8.0)

## 2020-05-02 LAB — RESP PANEL BY RT-PCR (FLU A&B, COVID) ARPGX2
Influenza A by PCR: NEGATIVE
Influenza B by PCR: NEGATIVE
SARS Coronavirus 2 by RT PCR: POSITIVE — AB

## 2020-05-02 MED ORDER — LACTATED RINGERS IV BOLUS
1000.0000 mL | Freq: Once | INTRAVENOUS | Status: AC
  Administered 2020-05-02: 1000 mL via INTRAVENOUS

## 2020-05-02 MED ORDER — PROMETHAZINE HCL 25 MG PO TABS: 25.0000 mg | ORAL_TABLET | Freq: Four times a day (QID) | ORAL | 0 refills | Status: DC | PRN

## 2020-05-02 MED ORDER — ONDANSETRON HCL 4 MG/2ML IJ SOLN
4.0000 mg | Freq: Once | INTRAMUSCULAR | Status: AC
  Administered 2020-05-02: 4 mg via INTRAVENOUS
  Filled 2020-05-02: qty 2

## 2020-05-02 MED ORDER — ACETAMINOPHEN 325 MG PO TABS
650.0000 mg | ORAL_TABLET | Freq: Once | ORAL | Status: AC
  Administered 2020-05-02: 650 mg via ORAL
  Filled 2020-05-02: qty 2

## 2020-05-02 NOTE — ED Notes (Signed)
Called [employer] for pt

## 2020-05-02 NOTE — Discharge Instructions (Signed)
Your Covid test was positive today.  Please keep taking medications as prescribed.  Please follow-up with your OB/GYN.  Please call the office today to touch base with them about procedure given that your Covid positive.  I did discuss with our on-call OB/GYN who is reassured by your work-up today.  I have prescribed you Phenergan to use as needed for nausea. Please drink plenty of water.  Please monitor your symptoms. Return to ED if symptoms persist or worsen. Tylenol 650-1000mg  every 6 hours for headache.  I have reached out to the antibody infusion clinic. Expect a call from them.

## 2020-05-02 NOTE — ED Provider Notes (Signed)
[facility]-EMERGENCY DEPT Provider Note   CSN: [id number] Arrival date & time: 05/01/20  1711     History Chief Complaint  Patient presents with  . Headache  . Generalized Body Aches  . Nausea  . Dizziness  . Emesis    Kelly Lane is a 24 y.o. female.  HPI Patient is a 24 year old female who is [redacted] weeks pregnant who has had ultrasound and proven intrauterine pregnancy.  She also has a past medical history significant for endometriosis, headaches, ovarian cyst.  Patient states that yesterday morning she woke up with full body aches which she then specifies includes abdominal, back, neck and headache.  She also has felt nauseous and has vomited x2 that was nonbloody nonbilious.  She states the headache came on approximately 3 PM after she had been up and around for a portion of the day.  She states that she took 1 dose of Mucinex cold and flu which has 325 mg of Tylenol.  Mild improvement with this.  She has taken no other medications prior to arrival.   She denies any chest pain or shortness of breath.  She has no known Covid exposure.  Endorses some mild sinus congestion.  She is not vaccinated for COVID-19. She states that she has a follow-up appointment with her OB/GYN tomorrow.  She denies any cough, fevers, chills, chest pain, shortness of breath.  She denies any lower extremity swelling.     Past Medical History:  Diagnosis Date  . Endometriosis 07/2015  . GERD (gastroesophageal reflux disease)    NO MEDS  . Headache   . Ovarian cyst 07/2015    Patient Active Problem List   Diagnosis Date Noted  . Endometriosis 07/17/2015    Past Surgical History:  Procedure Laterality Date  . LAPAROSCOPIC OVARIAN CYSTECTOMY Right 07/17/2015   Procedure: EXCISION OF ENDOMETRIOSIS;  Surgeon: Nadara Mustard, MD;  Location: [facility] ORS;  Service: Gynecology;  Laterality: Right;  . LAPAROSCOPY N/A 07/17/2015   Procedure: LAPAROSCOPY OPERATIVE;  Surgeon: Nadara Mustard, MD;  Location: [facility] ORS;  Service: Gynecology;  Laterality: N/A;  . NO PAST SURGERIES       OB History    Gravida  1   Para  0   Term  0   Preterm  0   AB  0   Living  0     SAB  0   IAB  0   Ectopic  0   Multiple  0   Live Births  0           Family History  Problem Relation Age of Onset  . Hypertension Mother     Social History   Tobacco Use  . Smoking status: Never Smoker  . Smokeless tobacco: Never Used  Vaping Use  . Vaping Use: Never used  Substance Use Topics  . Alcohol use: Not Currently    Comment: every six months maybe 1 drink  . Drug use: No    Home Medications Prior to Admission medications   Medication Sig Start Date End Date Taking? Authorizing Provider  Prenatal Vit-Fe Fumarate-FA (PRENATAL MULTIVITAMIN) TABS tablet Take 1 tablet by mouth daily at 12 noon.   Yes [provider]  promethazine (PHENERGAN) 25 MG tablet Take 1 tablet (25 mg total) by mouth every 6 (six) hours as needed for nausea or vomiting. 05/02/20  Yes Odette Watanabe S, PA  sertraline (ZOLOFT) 50 MG tablet Take 1 tablet (50 mg total) by  mouth daily. Start with 1/2 tab PO q hs x 1 week then increase to 1 tab PO q hs Patient not taking: No sig reported 10/13/19   Margaretann Loveless, PA-C    Allergies    Patient has no known allergies.  Review of Systems   Review of Systems  Constitutional: Positive for fatigue. Negative for chills and fever.  HENT: Negative for congestion.   Eyes: Negative for pain.  Respiratory: Negative for cough and shortness of breath.   Cardiovascular: Negative for chest pain and leg swelling.  Gastrointestinal: Positive for abdominal pain, nausea and vomiting.  Genitourinary: Negative for dysuria.  Musculoskeletal: Positive for myalgias.  Skin: Negative for rash.  Neurological: Positive for headaches. Negative for dizziness.    Physical Exam Updated Vital Signs BP 123/89   Pulse 95   Temp 98.7 F (37.1 C) (Oral)    Resp 18   Ht 5\' 3"  (1.6 m)   Wt 63.5 kg   LMP 01/28/2020   SpO2 97%   BMI 24.80 kg/m   Physical Exam Vitals and nursing note reviewed.  Constitutional:      General: She is not in acute distress. HENT:     Head: Normocephalic and atraumatic.     Nose: Nose normal.     Mouth/Throat:     Mouth: Mucous membranes are dry.     Comments: Dry oral mucosa  Eyes:     General: No scleral icterus. Cardiovascular:     Rate and Rhythm: Normal rate and regular rhythm.     Pulses: Normal pulses.     Heart sounds: Normal heart sounds.  Pulmonary:     Effort: Pulmonary effort is normal. No respiratory distress.     Breath sounds: Normal breath sounds. No wheezing.  Abdominal:     Palpations: Abdomen is soft.     Tenderness: There is no abdominal tenderness. There is no right CVA tenderness, left CVA tenderness, guarding or rebound.  Musculoskeletal:     Cervical back: Normal range of motion and neck supple. No tenderness.     Right lower leg: No edema.     Left lower leg: No edema.  Skin:    General: Skin is warm and dry.     Capillary Refill: Capillary refill takes less than 2 seconds.  Neurological:     Mental Status: She is alert. Mental status is at baseline.     Comments: A&O x3, moves all 4 extremities, sensation intact in all 4 extremities, demonstrates good coordination with movement around the room, standing, sitting, walking.  Psychiatric:        Mood and Affect: Mood normal.        Behavior: Behavior normal.     ED Results / Procedures / Treatments   Labs (all labs ordered are listed, but only abnormal results are displayed) Labs Reviewed  RESP PANEL BY RT-PCR (FLU A&B, COVID) ARPGX2 - Abnormal; Notable for the following components:      Result Value   SARS Coronavirus 2 by RT PCR POSITIVE (*)    All other components within normal limits  COMPREHENSIVE METABOLIC PANEL - Abnormal; Notable for the following components:   Sodium 134 (*)    Potassium 3.4 (*)    CO2  21 (*)    BUN 5 (*)    AST 48 (*)    All other components within normal limits  URINALYSIS, ROUTINE W REFLEX MICROSCOPIC - Abnormal; Notable for the following components:   APPearance HAZY (*)  Ketones, ur 80 (*)    Protein, ur 30 (*)    Bacteria, UA RARE (*)    All other components within normal limits  LIPASE, BLOOD  CBC    EKG None  Radiology No results found.  Procedures Procedures (including critical care time)  Medications Ordered in ED Medications  acetaminophen (TYLENOL) tablet 650 mg (650 mg Oral Given 05/02/20 0907)  ondansetron (ZOFRAN) injection 4 mg (4 mg Intravenous Given 05/02/20 0907)  lactated ringers bolus 1,000 mL (0 mLs Intravenous Stopped 05/02/20 [id number])    ED Course  I have reviewed the triage vital signs and the nursing notes.  Pertinent labs & imaging results that were available during my care of the patient were reviewed by me and considered in my medical decision making (see chart for details).  Patient is 24 year old female with past medical history detailed below presented today for headaches and body aches. Given that she is [redacted] weeks pregnant she is early on to be experiencing preeclampsia however does have some borderline elevated blood pressures.  Will discuss with OB/GYN.  In the meantime well check basic labs that she is having some abdominal cramps as well.  Will check Covid and urinalysis as well.  Physical exam is relatively unremarkable.  She is well-appearing has vital signs within normal limits although she was documented to be relatively tachycardic in triage.  Covid test is positive.  This result was delayed due to laboratory issues.  The reran the swab which came back positive. CBC  without leukocytosis or anemia.  CMP with very mildly elevated AST.  Very mildly low sodium and potassium.  Urinalysis without any evidence of infection.  There is some protein present.  Given borderline blood pressures, pregnant status, mild AST  elevation, proteinuria will discuss with OB/GYN.  Clinical Course as of 05/03/20 0749  Wed May 02, 2020  1048 Discussed with on-call OBGYN 10:49 AM who states no need to intervene on BP at this time. As she has close follow up w/ OBGYN tomorrow will discharge once COVID results [WF]    Clinical Course User Index [WF] Tedd Sias, [place]    Discussed with [facility] infusion center who will contact patient if she qualifies for infusion.  Suspect the patient's headaches, transaminitis and other symptoms are related to Covid.  OB/GYN has given reassurance that patient is likely not experiencing preeclampsia as she is very early on in her pregnancy.  She will follow up with her OB/GYN tomorrow.  Kelly Lane was evaluated in Emergency Department on 05/03/2020 for the symptoms described in the history of present illness. She was evaluated in the context of the global COVID-19 pandemic, which necessitated consideration that the patient might be at risk for infection with the SARS-CoV-2 virus that causes COVID-19. Institutional protocols and algorithms that pertain to the evaluation of patients at risk for COVID-19 are in a state of rapid change based on information released by regulatory bodies including the CDC and federal and state organizations. These policies and algorithms were followed during the patient's care in the ED.  MDM Rules/Calculators/[name]                          Final Clinical Impression(s) / ED Diagnoses Final diagnoses:  COVID-19  Acute nonintractable headache, unspecified headache type  Dehydration    Rx / DC Orders ED Discharge Orders         Ordered    promethazine (PHENERGAN) 25 MG tablet  Every 6 hours PRN        05/02/20 1354           Tedd Sias, [place] 05/03/20 0750    Daleen Bo, MD 05/04/20 1640

## 2020-05-02 NOTE — ED Notes (Addendum)
Pt said she does not have a ride home. Attempting to find transportation for COVID positive pt.

## 2020-05-02 NOTE — ED Notes (Addendum)
Pt is not COVID19 vaccinated. Pt states body aches started yesterday morning. Nausea vomiting, headache started yesterday evening. Pt states she is [redacted] weeks pregnant.

## 2020-05-03 ENCOUNTER — Encounter: Payer: BC Managed Care – PPO | Admitting: Obstetrics & Gynecology

## 2020-05-09 ENCOUNTER — Encounter (HOSPITAL_COMMUNITY): Payer: Self-pay | Admitting: Obstetrics & Gynecology

## 2020-05-09 ENCOUNTER — Inpatient Hospital Stay (HOSPITAL_COMMUNITY)
Admission: AD | Admit: 2020-05-09 | Discharge: 2020-05-09 | Disposition: A | Discharge status: Home / Self Care | Payer: Medicaid Other | Attending: Emergency Medicine | Admitting: Emergency Medicine

## 2020-05-09 ENCOUNTER — Emergency Department (HOSPITAL_COMMUNITY)
Admission: EM | Admit: 2020-05-09 | Discharge: 2020-05-09 | Discharge status: Absent without leave | Payer: Medicaid Other

## 2020-05-09 ENCOUNTER — Ambulatory Visit: Payer: Self-pay | Admitting: *Deleted

## 2020-05-09 ENCOUNTER — Other Ambulatory Visit: Payer: Self-pay

## 2020-05-09 DIAGNOSIS — Z3A14 14 weeks gestation of pregnancy: Secondary | ICD-10-CM | POA: Insufficient documentation

## 2020-05-09 DIAGNOSIS — O219 Vomiting of pregnancy, unspecified: Secondary | ICD-10-CM | POA: Diagnosis not present

## 2020-05-09 DIAGNOSIS — O98512 Other viral diseases complicating pregnancy, second trimester: Secondary | ICD-10-CM

## 2020-05-09 DIAGNOSIS — U071 COVID-19: Secondary | ICD-10-CM | POA: Diagnosis not present

## 2020-05-09 DIAGNOSIS — O210 Mild hyperemesis gravidarum: Secondary | ICD-10-CM

## 2020-05-09 HISTORY — DX: Anxiety disorder, unspecified: F41.9

## 2020-05-09 LAB — URINALYSIS, ROUTINE W REFLEX MICROSCOPIC
Bilirubin Urine: NEGATIVE
Glucose, UA: NEGATIVE mg/dL
Hgb urine dipstick: NEGATIVE
Ketones, ur: 80 mg/dL — AB
Leukocytes,Ua: NEGATIVE
Nitrite: NEGATIVE
Protein, ur: 30 mg/dL — AB
Specific Gravity, Urine: 1.032 — ABNORMAL HIGH (ref 1.005–1.030)
pH: 5 (ref 5.0–8.0)

## 2020-05-09 LAB — COMPREHENSIVE METABOLIC PANEL
ALT: 22 U/L (ref 0–44)
AST: 22 U/L (ref 15–41)
Albumin: 4 g/dL (ref 3.5–5.0)
Alkaline Phosphatase: 59 U/L (ref 38–126)
Anion gap: 13 (ref 5–15)
BUN: 9 mg/dL (ref 6–20)
CO2: 22 mmol/L (ref 22–32)
Calcium: 9.6 mg/dL (ref 8.9–10.3)
Chloride: 100 mmol/L (ref 98–111)
Creatinine, Ser: 0.67 mg/dL (ref 0.44–1.00)
GFR, Estimated: >60 mL/min (ref >60)
Glucose, Bld: 70 mg/dL (ref 70–99)
Potassium: 3.1 mmol/L — ABNORMAL LOW (ref 3.5–5.1)
Sodium: 135 mmol/L (ref 135–145)
Total Bilirubin: 1.5 mg/dL — ABNORMAL HIGH (ref 0.3–1.2)
Total Protein: 7.9 g/dL (ref 6.5–8.1)

## 2020-05-09 LAB — CBC
HCT: 39.9 % (ref 36.0–46.0)
Hemoglobin: 14.9 g/dL (ref 12.0–15.0)
MCH: 34.3 pg — ABNORMAL HIGH (ref 26.0–34.0)
MCHC: 37.3 g/dL — ABNORMAL HIGH (ref 30.0–36.0)
MCV: 91.9 fL (ref 80.0–100.0)
Platelets: 245 10*3/uL (ref 150–400)
RBC: 4.34 MIL/uL (ref 3.87–5.11)
RDW: 12.2 % (ref 11.5–15.5)
WBC: 7 10*3/uL (ref 4.0–10.5)
nRBC: 0 % (ref 0.0–0.2)

## 2020-05-09 LAB — POC URINE PREG, ED: Preg Test, Ur: POSITIVE — AB

## 2020-05-09 MED ORDER — LACTATED RINGERS IV BOLUS
2000.0000 mL | Freq: Once | INTRAVENOUS | Status: AC
  Administered 2020-05-09 (×2): 1000 mL via INTRAVENOUS

## 2020-05-09 MED ORDER — ONDANSETRON 4 MG PO TBDP: 4.0000 mg | ORAL_TABLET | Freq: Three times a day (TID) | ORAL | 1 refills | Status: DC | PRN

## 2020-05-09 MED ORDER — METOCLOPRAMIDE HCL 5 MG/ML IJ SOLN
10.0000 mg | Freq: Four times a day (QID) | INTRAMUSCULAR | Status: DC | PRN
  Administered 2020-05-09: 10 mg via INTRAVENOUS
  Filled 2020-05-09: qty 2

## 2020-05-09 MED ORDER — PROMETHAZINE HCL 25 MG RE SUPP: 25.0000 mg | Freq: Four times a day (QID) | RECTAL | 1 refills | Status: DC | PRN

## 2020-05-09 NOTE — Discharge Instructions (Signed)
Hyperemesis Gravidarum Hyperemesis gravidarum is a severe form of nausea and vomiting that happens during pregnancy. Hyperemesis is worse than morning sickness. It may cause you to have nausea or vomiting all day for many days. It may keep you from eating and drinking enough food and liquids, which can lead to dehydration, malnutrition, and weight loss. Hyperemesis usually occurs during the first half (the first 20 weeks) of pregnancy. It often goes away once a woman is in her second half of pregnancy. However, sometimes hyperemesis continues through an entire pregnancy. What are the causes? The cause of this condition is not known. It may be related to changes in chemicals (hormones) in the body during pregnancy, such as the high level of pregnancy hormone (human chorionic gonadotropin) or the increase in the female sex hormone (estrogen). What are the signs or symptoms? Symptoms of this condition include:  Nausea that does not go away.  Vomiting that does not allow you to keep any food down.  Weight loss.  Body fluid loss (dehydration).  Having no desire to eat, or not liking food that you have previously enjoyed. How is this diagnosed? This condition may be diagnosed based on:  A physical exam.  Your medical history.  Your symptoms.  Blood tests.  Urine tests. How is this treated? This condition is managed by controlling symptoms. This may include:  Following an eating plan. This can help lessen nausea and vomiting.  Taking prescription medicines. An eating plan and medicines are often used together to help control symptoms. If medicines do not help relieve nausea and vomiting, you may need to receive fluids through an IV at the hospital. Follow these instructions at home: Eating and drinking   Avoid the following: ? Drinking fluids with meals. Try not to drink anything during the 30 minutes before and after your meals. ? Drinking more than 1 cup of fluid at a  time. ? Eating foods that trigger your symptoms. These may include spicy foods, coffee, high-fat foods, very sweet foods, and acidic foods. ? Skipping meals. Nausea can be more intense on an empty stomach. If you cannot tolerate food, do not force it. Try sucking on ice chips or other frozen items and make up for missed calories later. ? Lying down within 2 hours after eating. ? Being exposed to environmental triggers. These may include food smells, smoky rooms, closed spaces, rooms with strong smells, warm or humid places, overly loud and noisy rooms, and rooms with motion or flickering lights. Try eating meals in a well-ventilated area that is free of strong smells. ? Quick and sudden changes in your movement. ? Taking iron pills and multivitamins that contain iron. If you take prescription iron pills, do not stop taking them unless your health care provider approves. ? Preparing food. The smell of food can spoil your appetite or trigger nausea.  To help relieve your symptoms: ? Listen to your body. Everyone is different and has different preferences. Find what works best for you. ? Eat and drink slowly. ? Eat 5-6 small meals daily instead of 3 large meals. Eating small meals and snacks can help you avoid an empty stomach. ? In the morning, before getting out of bed, eat a couple of crackers to avoid moving around on an empty stomach. ? Try eating starchy foods as these are usually tolerated well. Examples include cereal, toast, bread, potatoes, pasta, rice, and pretzels. ? Include at least 1 serving of protein with your meals and snacks. Protein options include   lean meats, poultry, seafood, beans, nuts, nut butters, eggs, cheese, and yogurt. ? Try eating a protein-rich snack before bed. Examples of a protein-rick snack include cheese and crackers or a peanut butter sandwich made with 1 slice of whole-wheat bread and 1 tsp (5 g) of peanut butter. ? Eat or suck on things that have ginger in them.  It may help relieve nausea. Add  tsp ground ginger to hot tea or choose ginger tea. ? Try drinking 100% fruit juice or an electrolyte drink. An electrolyte drink contains sodium, potassium, and chloride. ? Drink fluids that are cold, clear, and carbonated or sour. Examples include lemonade, ginger ale, lemon-lime soda, ice water, and sparkling water. ? Brush your teeth or use a mouth rinse after meals. ? Talk with your health care provider about starting a supplement of vitamin B6. General instructions  Take over-the-counter and prescription medicines only as told by your health care provider.  Follow instructions from your health care provider about eating or drinking restrictions.  Continue to take your prenatal vitamins as told by your health care provider. If you are having trouble taking your prenatal vitamins, talk with your health care provider about different options.  Keep all follow-up and pre-birth (prenatal) visits as told by your health care provider. This is important. Contact a health care provider if:  You have pain in your abdomen.  You have a severe headache.  You have vision problems.  You are losing weight.  You feel weak or dizzy. Get help right away if:  You cannot drink fluids without vomiting.  You vomit blood.  You have constant nausea and vomiting.  You are very weak.  You faint.  You have a fever and your symptoms suddenly get worse. Summary  Hyperemesis gravidarum is a severe form of nausea and vomiting that happens during pregnancy.  Making some changes to your eating habits may help relieve nausea and vomiting.  This condition may be managed with medicine.  If medicines do not help relieve nausea and vomiting, you may need to receive fluids through an IV at the hospital. This information is not intended to replace advice given to you by your health care provider. Make sure you discuss any questions you have with your health care  provider. Document Revised: 05/11/2017 Document Reviewed: 12/19/2015 Elsevier Patient Education  2020 [employer]  

## 2020-05-09 NOTE — Telephone Encounter (Signed)
C/o vomiting bright red blood this am around 2 am. Throat pain reported now and burning. Paitent is covid positive and on day 9 of quarantine. Denies dizziness and lightheadedness. Reports she can not keep food down can drink and urinate.  Patient reports she is possibly pregnant and LMP in September. Patient reports she took a pregnancy test and it was positive and has not been to see a doctor at this time. Patient reports she is not able to eat and has some abdominal pain but not severe. Encouraged patient to go to ED for assessment due to pregnancy. Care advise given. Patient verbalized understanding and will go to ED.   Reason for Disposition . High-risk adult (e.g., diabetes mellitus, brain tumor, V-P shunt, inguinal hernia, chemotherapy)  Answer Assessment - Initial Assessment Questions 1. APPEARANCE of BLOOD: "What does the blood look like?" (e.g., color, coffee-grounds)     Color if bright red mucus 2. AMOUNT: "How much blood was lost?"     "good size" less than a cup 3. VOMITING BLOOD: "How many times did it happen?" or "How many times in the past 24 hours?"     Once  4. VOMITING WITHOUT BLOOD: "How many times in the past 24 hours?"      Once  5. ONSET: "When did vomiting of blood begin?"     2 am  6. CAUSE: "What do you think is causing the vomiting of blood?"     Ate an orange and got acid reflux  7. BLOOD THINNERS: "Do you take any blood thinners?" (e.g., Coumadin/warfarin, Pradaxa/dabigatran, aspirin)     no 8. DEHYDRATION: "Are there any signs of dehydration?" "When was the last time you urinated?" "Do you feel dizzy?"     No dizziness can urinate 9. ABDOMINAL PAIN: "Are you having any abdominal pain?" If Yes, ask: "What does it feel like? " (e.g., crampy, dull, intermittent, constant)      Middle of abdomen . Painful and sore . 10. DIARRHEA: "Is there any diarrhea?" If Yes, ask: "How many times today?"        Yes yesterday . X 1. 11. OTHER SYMPTOMS: "Do you have any other  symptoms?" (e.g., fever, blood in stool)       No  12. PREGNANCY: "Is there any chance you are pregnant?" "When was your last menstrual period?"      Yes LMP september  Protocols used: VOMITING BLOOD-A-AH

## 2020-05-09 NOTE — MAU Provider Note (Addendum)
History     CSN: [id number]  Arrival date and time: 05/09/20 1626   None     Chief Complaint  Patient presents with  . Hematemesis  . Emesis During Pregnancy  . Nausea  . Emesis   HPI  G2P0010 at [redacted]w[redacted]d presenting for nausea and vomiting. Patient tested positive for Covid on 12/29. At that time was evaluated in ER and had generalized body aches, headache, as well as nausea, etc. Was given iv zofran and fluids in ER and discharged with PO phenergan. Reports that she has continued to struggle with nausea/vomiting since then and that the IV medication and the PO phenergan have not helped. Reports that yesterday she was not able to tolerate any PO intake at all and vomited until she had blood specks in vomit. Today says she has been able to have some sips of water but that's it. Continues to endorse nausea. Does not know if she has had weight loss.   Says she was experiencing some nausea/vomiting in early pregnancy but not to this level.   Reports decreased urination and constipation.   Her other covid symptoms have improved. Denies headache, upper respiratory symptoms, cough, chest pain, body aches.     OB History    Gravida  2   Para  0   Term  0   Preterm  0   AB  1   Living  0     SAB  0   IAB  1   Ectopic  0   Multiple  1   Live Births  0           Past Medical History:  Diagnosis Date  . Anxiety   . Endometriosis 07/2015  . GERD (gastroesophageal reflux disease)    NO MEDS  . Headache   . Ovarian cyst 07/2015    Past Surgical History:  Procedure Laterality Date  . LAPAROSCOPIC OVARIAN CYSTECTOMY Right 07/17/2015   Procedure: EXCISION OF ENDOMETRIOSIS;  Surgeon: Nadara Mustard, MD;  Location: [facility] ORS;  Service: Gynecology;  Laterality: Right;  . LAPAROSCOPY N/A 07/17/2015   Procedure: LAPAROSCOPY OPERATIVE;  Surgeon: Nadara Mustard, MD;  Location: [facility] ORS;  Service: Gynecology;  Laterality: N/A;  . NO PAST SURGERIES      Family History   Problem Relation Age of Onset  . Hypertension Mother     Social History   Tobacco Use  . Smoking status: Never Smoker  . Smokeless tobacco: Never Used  Vaping Use  . Vaping Use: Never used  Substance Use Topics  . Alcohol use: Not Currently    Comment: every six months maybe 1 drink  . Drug use: No    Allergies: No Known Allergies  Medications Prior to Admission  Medication Sig Dispense Refill Last Dose  . Prenatal Vit-Fe Fumarate-FA (PRENATAL MULTIVITAMIN) TABS tablet Take 1 tablet by mouth daily at 12 noon.   Past Month at Unknown time  . promethazine (PHENERGAN) 25 MG tablet Take 1 tablet (25 mg total) by mouth every 6 (six) hours as needed for nausea or vomiting. 15 tablet 0 Past Month at Unknown time  . sertraline (ZOLOFT) 50 MG tablet Take 1 tablet (50 mg total) by mouth daily. Start with 1/2 tab PO q hs x 1 week then increase to 1 tab PO q hs (Patient not taking: No sig reported) 30 tablet 3 More than a month at Unknown time    Review of Systems  Constitutional: Positive for fatigue. Negative for chills  and fever.  HENT: Negative for congestion.   Respiratory: Negative for cough and shortness of breath.   Cardiovascular: Negative for chest pain.  Gastrointestinal: Positive for constipation, nausea and vomiting. Negative for diarrhea.  Genitourinary: Negative for difficulty urinating.  Neurological: Negative for dizziness and headaches.   Physical Exam   Blood pressure 130/90, pulse 88, temperature 98.3 F (36.8 C), temperature source Oral, resp. rate 18, height 5\' 3"  (1.6 m), weight 62.4 kg, last menstrual period 01/28/2020, SpO2 98 %, unknown if currently breastfeeding.  Physical Exam Vitals and nursing note reviewed.  Constitutional:      Appearance: Normal appearance.  Cardiovascular:     Rate and Rhythm: Normal rate.  Pulmonary:     Effort: Pulmonary effort is normal.  Abdominal:     General: Bowel sounds are normal. There is no distension.      Palpations: Abdomen is soft.     Tenderness: There is no abdominal tenderness.  Skin:    General: Skin is warm and dry.  Neurological:     Mental Status: She is alert.  Psychiatric:        Mood and Affect: Mood normal.        Behavior: Behavior normal.     MAU Course  Procedures  MDM Pt evaluated at bedside 2L IVF, 10mg  IV reglan, CBC, CMP  Care signed out to Hansel Feinstein CNM at 2000.   Sharene Skeans, MD OB Family Medicine Fellow, [facility] for [facility]   Assessment and Plan    N/V of preg +Covid  -Feels a little better after 2L of IVF and Reglan -D/C home with rx Zofran ODT and Phen suppositories (has PO Phen at pharmacy that she didn't pick up) -Tips given to keep food/fluids down -Has NOB appt at [facility] on 05/15/20  [name] 05/09/2020 10:22 PM

## 2020-05-09 NOTE — ED Provider Notes (Signed)
MSE was initiated and I personally evaluated the patient and placed orders (if any) at  5:48 PM on May 09, 2020.  The patient appears stable so that the remainder of the MSE may be completed by another provider.  5:48 PM Pt is G2P1 presenting with [redacted] weeks pregnant and persistent N/V at home and minimal streaks of blood in emesis today.  No prenatal care as still waiting on insurance.  No abd pain or vaginal bleeding today.  Preg positive here.  No fever, cough or SOB or covid like symptoms.  VS wnl.  Seems appropriate for MAU   Gwyneth Sprout, MD 05/09/20 1750

## 2020-05-09 NOTE — Progress Notes (Signed)
Derrill Memo CNM in earlier to discuss d/c plan. Written and verbal d/c instructions given and understanding voiced.

## 2020-05-09 NOTE — Progress Notes (Signed)
Presents with c/o N/V that began last night @ 2200, reports unable to keep anything down.  Also reports streaks of blood in emesis and burning in esophagus.

## 2020-05-15 ENCOUNTER — Encounter: Payer: BC Managed Care – PPO | Admitting: Obstetrics

## 2020-05-24 ENCOUNTER — Encounter: Payer: Self-pay | Admitting: Obstetrics

## 2020-05-24 ENCOUNTER — Other Ambulatory Visit: Payer: Self-pay

## 2020-05-24 ENCOUNTER — Other Ambulatory Visit (HOSPITAL_COMMUNITY)
Admission: RE | Admit: 2020-05-24 | Discharge: 2020-05-24 | Disposition: A | Discharge status: Home / Self Care | Payer: BC Managed Care – PPO | Source: Ambulatory Visit | Attending: Obstetrics & Gynecology | Admitting: Obstetrics & Gynecology

## 2020-05-24 ENCOUNTER — Ambulatory Visit (INDEPENDENT_AMBULATORY_CARE_PROVIDER_SITE_OTHER): Payer: Medicaid Other | Admitting: Obstetrics

## 2020-05-24 VITALS — BP 125/89 | HR 93 | Wt 143.0 lb

## 2020-05-24 DIAGNOSIS — O99340 Other mental disorders complicating pregnancy, unspecified trimester: Secondary | ICD-10-CM

## 2020-05-24 DIAGNOSIS — K5900 Constipation, unspecified: Secondary | ICD-10-CM

## 2020-05-24 DIAGNOSIS — O0990 Supervision of high risk pregnancy, unspecified, unspecified trimester: Secondary | ICD-10-CM | POA: Insufficient documentation

## 2020-05-24 DIAGNOSIS — O23591 Infection of other part of genital tract in pregnancy, first trimester: Secondary | ICD-10-CM | POA: Insufficient documentation

## 2020-05-24 DIAGNOSIS — B373 Candidiasis of vulva and vagina: Secondary | ICD-10-CM | POA: Insufficient documentation

## 2020-05-24 DIAGNOSIS — N898 Other specified noninflammatory disorders of vagina: Secondary | ICD-10-CM | POA: Diagnosis present

## 2020-05-24 DIAGNOSIS — O99611 Diseases of the digestive system complicating pregnancy, first trimester: Secondary | ICD-10-CM | POA: Diagnosis not present

## 2020-05-24 DIAGNOSIS — O219 Vomiting of pregnancy, unspecified: Secondary | ICD-10-CM | POA: Insufficient documentation

## 2020-05-24 DIAGNOSIS — F32A Depression, unspecified: Secondary | ICD-10-CM

## 2020-05-24 DIAGNOSIS — O99619 Diseases of the digestive system complicating pregnancy, unspecified trimester: Secondary | ICD-10-CM

## 2020-05-24 DIAGNOSIS — Z3490 Encounter for supervision of normal pregnancy, unspecified, unspecified trimester: Secondary | ICD-10-CM

## 2020-05-24 MED ORDER — DOCUSATE SODIUM 100 MG PO CAPS: 100.0000 mg | ORAL_CAPSULE | Freq: Two times a day (BID) | ORAL | 5 refills | Status: DC

## 2020-05-24 MED ORDER — POLYETHYLENE GLYCOL 3350 17 G PO PACK: 17.0000 g | PACK | Freq: Every day | ORAL | 5 refills | Status: DC

## 2020-05-24 MED ORDER — DOXYLAMINE-PYRIDOXINE 10-10 MG PO TBEC: DELAYED_RELEASE_TABLET | ORAL | 5 refills | Status: DC

## 2020-05-24 MED ORDER — VITAFOL GUMMIES 3.33-0.333-34.8 MG PO CHEW: 3.0000 | CHEWABLE_TABLET | Freq: Every day | ORAL | 11 refills | Status: DC

## 2020-05-24 NOTE — Progress Notes (Signed)
Subjective:    Kelly Lane is being seen today for her first obstetrical visit.  This is not a planned pregnancy. She is at [redacted]w[redacted]d gestation. Her obstetrical history is significant for none. Relationship with FOB: significant other, not living together. Patient does intend to breast feed. Pregnancy history fully reviewed.  The information documented in the HPI was reviewed and verified.  Menstrual History: OB History    Gravida  2   Para  0   Term  0   Preterm  0   AB  1   Living  0     SAB  0   IAB  1   Ectopic  0   Multiple  1   Live Births  0            Patient's last menstrual period was 01/28/2020.    Past Medical History:  Diagnosis Date  . Anxiety   . Endometriosis 07/2015  . GERD (gastroesophageal reflux disease)    NO MEDS  . Headache   . Ovarian cyst 07/2015    Past Surgical History:  Procedure Laterality Date  . LAPAROSCOPIC OVARIAN CYSTECTOMY Right 07/17/2015   Procedure: EXCISION OF ENDOMETRIOSIS;  Surgeon: Gae Dry, MD;  Location: [facility] ORS;  Service: Gynecology;  Laterality: Right;  . LAPAROSCOPY N/A 07/17/2015   Procedure: LAPAROSCOPY OPERATIVE;  Surgeon: Gae Dry, MD;  Location: [facility] ORS;  Service: Gynecology;  Laterality: N/A;  . NO PAST SURGERIES      (Not in a hospital admission)  No Known Allergies  Social History   Tobacco Use  . Smoking status: Never Smoker  . Smokeless tobacco: Never Used  Substance Use Topics  . Alcohol use: Not Currently    Comment: every six months maybe 1 drink    Family History  Problem Relation Age of Onset  . Hypertension Mother      Review of Systems Constitutional: negative for weight loss Gastrointestinal: negative for vomiting Genitourinary:negative for genital lesions and vaginal discharge and dysuria Musculoskeletal:negative for back pain Behavioral/Psych: negative for abusive relationship, depression, illegal drug usage and tobacco use    Objective:    BP 125/89    Pulse 93   Wt 143 lb (64.9 kg)   LMP 01/28/2020   BMI 25.33 kg/m  General Appearance:    Alert, cooperative, no distress, appears stated age  Head:    Normocephalic, without obvious abnormality, atraumatic  Eyes:    PERRL, conjunctiva/corneas clear, EOM's intact, fundi    benign, both eyes  Ears:    Normal TM's and external ear canals, both ears  Nose:   Nares normal, septum midline, mucosa normal, no drainage    or sinus tenderness  Throat:   Lips, mucosa, and tongue normal; teeth and gums normal  Neck:   Supple, symmetrical, trachea midline, no adenopathy;    thyroid:  no enlargement/tenderness/nodules; no carotid   bruit or JVD  Back:     Symmetric, no curvature, ROM normal, no CVA tenderness  Lungs:     Clear to auscultation bilaterally, respirations unlabored  Chest Wall:    No tenderness or deformity   Heart:    Regular rate and rhythm, S1 and S2 normal, no murmur, rub   or gallop  Breast Exam:    No tenderness, masses, or nipple abnormality  Abdomen:     Soft, non-tender, bowel sounds active all four quadrants,    no masses, no organomegaly  Genitalia:    Normal female without lesion, discharge  or tenderness  Extremities:   Extremities normal, atraumatic, no cyanosis or edema  Pulses:   2+ and symmetric all extremities  Skin:   Skin color, texture, turgor normal, no rashes or lesions  Lymph nodes:   Cervical, supraclavicular, and axillary nodes normal  Neurologic:   CNII-XII intact, normal strength, sensation and reflexes    throughout      Lab Review Urine pregnancy test Labs reviewed yes Radiologic studies reviewed no   US OB Comp Less 14 Wks (Accession [id number]) (Order [id number]) Imaging Date: 04/14/2020 Department: [facility] DEPT Released By/Authorizing: Shanon Rosser, MD (auto-released)    Exam Status  Status  Final [99]   PACS Intelerad Image Link  Show images for US OB Comp Less 14 Wks  Study Result  Narrative &  Impression  CLINICAL DATA:  First trimester of pregnancy, abdominal trauma.  EXAM: OBSTETRIC <14 WK ULTRASOUND  TECHNIQUE: Transabdominal ultrasound was performed for evaluation of the gestation as well as the maternal uterus and adnexal regions.  COMPARISON:  None.  FINDINGS: Intrauterine gestational sac: Single  Yolk sac:  Not Visualized.  Embryo:  Visualized.  Cardiac Activity: Visualized.  Heart Rate: 165 bpm  CRL:   50.4 mm   11 w 6 d                  [place] EDC: October 28, 2020.  Subchorionic hemorrhage:  None visualized.  Maternal uterus/adnexae: No free fluid is noted. 3.8 cm fibroid is seen arising from the uterine fundus. Probable corpus luteum cyst seen in right ovary. Left ovary is unremarkable.  IMPRESSION: Single live intrauterine gestation of 11 weeks 6 days.   Electronically Signed   By: Marijo Conception M.D.   On: 04/14/2020 08:45        Assessment:    Pregnancy at [redacted]w[redacted]d weeks    Plan:     1. Encounter for supervision of normal pregnancy, antepartum, unspecified gravidity Rx: - Genetic Screening - Cervicovaginal ancillary only( [place]) - CBC/D/Plt+RPR+Rh+ABO+Rub Ab... - Culture, OB Urine - AFP, Serum, Open Spina Bifida - [place] MFM OB COMP + 14 WK; Future - Prenatal Vit-Fe Phos-FA-Omega (VITAFOL GUMMIES) 3.33-0.333-34.8 MG CHEW; Chew 3 tablets by mouth daily before breakfast.  Dispense: 90 tablet; Refill: 11  2. Nausea and vomiting during pregnancy Rx: - Doxylamine-Pyridoxine (DICLEGIS) 10-10 MG TBEC; 1 tab in AM, 1 tab mid afternoon 2 tabs at bedtime. Max dose 4 tabs daily.  Dispense: 100 tablet; Refill: 5  3. Depression affecting pregnancy, antepartum Rx: - Ambulatory referral to [facility]  4. Constipation during pregnancy, antepartum Rx: - docusate sodium (COLACE) 100 MG capsule; Take 1 capsule (100 mg total) by mouth 2 (two) times daily.  Dispense: 60 capsule; Refill: 5   Prenatal vitamins.   Counseling provided regarding continued use of seat belts, cessation of alcohol consumption, smoking or use of illicit drugs; infection precautions i.e., influenza/TDAP immunizations, toxoplasmosis,CMV, parvovirus, listeria and varicella; workplace safety, exercise during pregnancy; routine dental care, safe medications, sexual activity, hot tubs, saunas, pools, travel, caffeine use, fish and methlymercury, potential toxins, hair treatments, varicose veins Weight gain recommendations per IOM guidelines reviewed: underweight/BMI< 18.5--> gain 28 - 40 lbs; normal weight/BMI 18.5 - 24.9--> gain 25 - 35 lbs; overweight/BMI 25 - 29.9--> gain 15 - 25 lbs; obese/BMI >30->gain  11 - 20 lbs Problem list reviewed and updated. FIRST/CF mutation testing/NIPT/QUAD SCREEN/fragile X/Ashkenazi Jewish population testing/Spinal muscular atrophy discussed: requested. Role of ultrasound in pregnancy discussed; fetal survey:  requested. Amniocentesis discussed: not indicated.  Meds ordered this encounter  Medications  . Prenatal Vit-Fe Phos-FA-Omega (VITAFOL GUMMIES) 3.33-0.333-34.8 MG CHEW    Sig: Chew 3 tablets by mouth daily before breakfast.    Dispense:  90 tablet    Refill:  11  . Doxylamine-Pyridoxine (DICLEGIS) 10-10 MG TBEC    Sig: 1 tab in AM, 1 tab mid afternoon 2 tabs at bedtime. Max dose 4 tabs daily.    Dispense:  100 tablet    Refill:  5  . docusate sodium (COLACE) 100 MG capsule    Sig: Take 1 capsule (100 mg total) by mouth 2 (two) times daily.    Dispense:  60 capsule    Refill:  5  . polyethylene glycol (MIRALAX) 17 g packet    Sig: Take 17 g by mouth daily.    Dispense:  30 each    Refill:  5   Orders Placed This Encounter  Procedures  . Culture, OB Urine  . [place] MFM OB COMP + 14 WK    Standing Status:   Future    Standing Expiration Date:   05/24/2021    Order Specific Question:   Reason for Exam (SYMPTOM  OR DIAGNOSIS REQUIRED)    Answer:   Anatomy    Order Specific Question:    Preferred Location    Answer:   [facility] Ultrasound  . Genetic Screening    [place]  . CBC/D/Plt+RPR+Rh+ABO+Rub Ab...  . AFP, Serum, Open Spina Bifida    Order Specific Question:   Is patient insulin dependent?    Answer:   No    Order Specific Question:   Patient weight (lb.)    Answer:   143 lb (64.9 kg)    Order Specific Question:   Gestational Age (GA), weeks    Answer:   16.5    Order Specific Question:   Date on which patient was at this [place]    Answer:   05/24/2020    Order Specific Question:   GA Calculation Method    Answer:   LMP    Order Specific Question:   Number of fetuses    Answer:   1  . Ambulatory referral to [place]    Referral Priority:   Routine    Referral Type:   Consultation    Referral Reason:   Specialty Services Required    Number of Visits Requested:   1    Follow up in 2 weeks. 50% of 20 min visit spent on counseling and coordination of care.    Shelly Bombard, MD 05/24/2020 4:08 PM

## 2020-05-24 NOTE — Progress Notes (Signed)
NOB  Planned: No   Last pap: 01/13/20 WNL   Genetic Screening: Desires   CC: Vaginal discharge , vaginal itching, constipation.  Covid Vaccine: No per pt   Pt taking Zofran for nauesa wants refill.  PHQ-9 :14 Provider made aware of pt depression screening score.  Front dest/scheduling also made aware.

## 2020-05-25 ENCOUNTER — Other Ambulatory Visit: Payer: Self-pay | Admitting: Obstetrics

## 2020-05-25 DIAGNOSIS — B373 Candidiasis of vulva and vagina: Secondary | ICD-10-CM

## 2020-05-25 DIAGNOSIS — B3731 Acute candidiasis of vulva and vagina: Secondary | ICD-10-CM

## 2020-05-25 LAB — CERVICOVAGINAL ANCILLARY ONLY
Bacterial Vaginitis (gardnerella): NEGATIVE
Candida Glabrata: NEGATIVE
Candida Vaginitis: POSITIVE — AB
Chlamydia: NEGATIVE
Comment: NEGATIVE
Comment: NEGATIVE
Comment: NEGATIVE
Comment: NEGATIVE
Comment: NEGATIVE
Comment: NORMAL
Neisseria Gonorrhea: NEGATIVE
Trichomonas: NEGATIVE

## 2020-05-25 MED ORDER — TERCONAZOLE 0.4 % VA CREA: 1.0000 | TOPICAL_CREAM | Freq: Every day | VAGINAL | 0 refills | Status: DC

## 2020-05-26 LAB — CULTURE, OB URINE

## 2020-05-26 LAB — URINE CULTURE, OB REFLEX

## 2020-05-27 LAB — CBC/D/PLT+RPR+RH+ABO+RUB AB...
Antibody Screen: NEGATIVE
Basophils Absolute: 0 10*3/uL (ref 0.0–0.2)
Basos: 0 %
EOS (ABSOLUTE): 0 10*3/uL (ref 0.0–0.4)
Eos: 0 %
HCV Ab: <0.1 s/co ratio (ref 0.0–0.9)
HIV Screen 4th Generation wRfx: NONREACTIVE
Hematocrit: 37.1 % (ref 34.0–46.6)
Hemoglobin: 13.1 g/dL (ref 11.1–15.9)
Hepatitis B Surface Ag: NEGATIVE
Immature Grans (Abs): 0.1 10*3/uL (ref 0.0–0.1)
Immature Granulocytes: 1 %
Lymphocytes Absolute: 1.6 10*3/uL (ref 0.7–3.1)
Lymphs: 16 %
MCH: 33.8 pg — ABNORMAL HIGH (ref 26.6–33.0)
MCHC: 35.3 g/dL (ref 31.5–35.7)
MCV: 96 fL (ref 79–97)
Monocytes Absolute: 0.6 10*3/uL (ref 0.1–0.9)
Monocytes: 7 %
Neutrophils Absolute: 7.1 10*3/uL — ABNORMAL HIGH (ref 1.4–7.0)
Neutrophils: 76 %
Platelets: 275 10*3/uL (ref 150–450)
RBC: 3.88 x10E6/uL (ref 3.77–5.28)
RDW: 11.7 % (ref 11.7–15.4)
RPR Ser Ql: NONREACTIVE
Rh Factor: NEGATIVE
Rubella Antibodies, IGG: 8.44 index (ref >0.99)
WBC: 9.5 10*3/uL (ref 3.4–10.8)

## 2020-05-27 LAB — AFP, SERUM, OPEN SPINA BIFIDA
AFP MoM: 1.38
AFP Value: 56.5 ng/mL
Gest. Age on Collection Date: 16.5 weeks
Maternal Age At EDD: 25.9 yr
OSBR Risk 1 IN: 7620
Test Results:: NEGATIVE
Weight: 143 [lb_av]

## 2020-05-27 LAB — HCV INTERPRETATION

## 2020-05-28 ENCOUNTER — Ambulatory Visit (INDEPENDENT_AMBULATORY_CARE_PROVIDER_SITE_OTHER): Payer: BC Managed Care – PPO | Admitting: Licensed Clinical Social Worker

## 2020-05-28 DIAGNOSIS — F32A Depression, unspecified: Secondary | ICD-10-CM | POA: Diagnosis not present

## 2020-05-28 DIAGNOSIS — O99340 Other mental disorders complicating pregnancy, unspecified trimester: Secondary | ICD-10-CM

## 2020-05-28 DIAGNOSIS — Z3A00 Weeks of gestation of pregnancy not specified: Secondary | ICD-10-CM | POA: Diagnosis not present

## 2020-05-28 NOTE — BH Specialist Note (Addendum)
Integrated Behavioral Health Initial Visit  MRN: [id number] Name: ROGER FASNACHT  Number of [facility] Clinician visits:: 1/6 Session Start time: 1:43pm  Session End time: 2:07pm Total time: 24 mins via phone due pt mychart connectivity PT: Home  Provider: [facility]  Types of Service: [place] (BHI)  Interpretor:No. Interpretor Name and Language: none   Warm Hand Off Completed.       Subjective: Kelly Lane is a 25 y.o. female accompanied by n/a Patient was referred by C. Jodi Mourning MD for elevated phq9. Patient reports the following symptoms/concerns: depressed mood, trouble staying asleep, racing thoughts Duration of problem: since 2019; Severity of problem: mild  Objective: Mood: Good and Affect: Appropriate Risk of harm to self or others: No plan to harm self or others LCSWA A. Linton Rump asked pt regarding history or present plans to cause self harm due to phq9. Ms. Pearse reports no plans to cause harm to herself or others.   Life Context: Family and Social: Lives in [place]/Pt reports supportive system consist of family and close friends. FOB is not closely involved.  School/Work: n/a Self-Care: Meditation  Life Changes: Unplanned pregnancy  Patient and/or Family's Strengths/Protective Factors: Concrete supports in place (healthy food, safe environments, etc.)  Goals Addressed: Patient will: 1. Reduce symptoms of: anxiety 2. Increase knowledge and/or ability of: coping skills  3. Demonstrate ability to: Increase healthy adjustment to current life circumstances  Progress towards Goals: Ongoing  Interventions: Interventions utilized: Supportive Counseling  Standardized Assessments completed: PHQ 9  Assessment: Patient currently experiencing anxiety affecting pregnancy   Patient may benefit from integrated behavioral health   Plan: 1. Follow up with behavioral health clinician on : 3 weeks mychart  2. Behavioral  recommendations: Prioritize rest, take warm baths to encourage restful sleep, incorporate mindfulness technique into mediation to alleviate stress 3. Referral(s): Medicaid caseworker for medicaid questions 4. "From scale of 1-10, how likely are you to follow plan?":   [name], LCSW

## 2020-06-04 ENCOUNTER — Encounter: Payer: Self-pay | Admitting: Obstetrics

## 2020-06-07 ENCOUNTER — Ambulatory Visit (INDEPENDENT_AMBULATORY_CARE_PROVIDER_SITE_OTHER): Payer: Medicaid Other | Admitting: Family Medicine

## 2020-06-07 ENCOUNTER — Encounter: Payer: Self-pay | Admitting: Family Medicine

## 2020-06-07 ENCOUNTER — Other Ambulatory Visit: Payer: Self-pay

## 2020-06-07 VITALS — BP 115/78 | HR 89 | Wt 145.6 lb

## 2020-06-07 DIAGNOSIS — Z3400 Encounter for supervision of normal first pregnancy, unspecified trimester: Secondary | ICD-10-CM

## 2020-06-07 DIAGNOSIS — O26899 Other specified pregnancy related conditions, unspecified trimester: Secondary | ICD-10-CM | POA: Insufficient documentation

## 2020-06-07 DIAGNOSIS — Z6791 Unspecified blood type, Rh negative: Secondary | ICD-10-CM | POA: Insufficient documentation

## 2020-06-07 MED ORDER — ONDANSETRON 4 MG PO TBDP: 4.0000 mg | ORAL_TABLET | Freq: Three times a day (TID) | ORAL | 1 refills | Status: DC | PRN

## 2020-06-07 MED ORDER — CYCLOBENZAPRINE HCL 5 MG PO TABS: 5.0000 mg | ORAL_TABLET | Freq: Every evening | ORAL | 0 refills | Status: DC | PRN

## 2020-06-07 NOTE — Patient Instructions (Signed)
Contraception Choices Contraception, also called birth control, refers to methods or devices that prevent pregnancy. Hormonal methods Contraceptive implant A contraceptive implant is a thin, plastic tube that contains a hormone that prevents pregnancy. It is different from an intrauterine device (IUD). It is inserted into the upper part of the arm by a health care provider. Implants can be effective for up to 3 years. Progestin-only injections Progestin-only injections are injections of progestin, a synthetic form of the hormone progesterone. They are given every 3 months by a health care provider. Birth control pills Birth control pills are pills that contain hormones that prevent pregnancy. They must be taken once a day, preferably at the same time each day. A prescription is needed to use this method of contraception. Birth control patch The birth control patch contains hormones that prevent pregnancy. It is placed on the skin and must be changed once a week for three weeks and removed on the fourth week. A prescription is needed to use this method of contraception. Vaginal ring A vaginal ring contains hormones that prevent pregnancy. It is placed in the vagina for three weeks and removed on the fourth week. After that, the process is repeated with a new ring. A prescription is needed to use this method of contraception. Emergency contraceptive Emergency contraceptives prevent pregnancy after unprotected sex. They come in pill form and can be taken up to 5 days after sex. They work best the sooner they are taken after having sex. Most emergency contraceptives are available without a prescription. This method should not be used as your only form of birth control.   Barrier methods Female condom A female condom is a thin sheath that is worn over the penis during sex. Condoms keep sperm from going inside a woman's body. They can be used with a sperm-killing substance (spermicide) to increase their  effectiveness. They should be thrown away after one use. Female condom A female condom is a soft, loose-fitting sheath that is put into the vagina before sex. The condom keeps sperm from going inside a woman's body. They should be thrown away after one use. Diaphragm A diaphragm is a soft, dome-shaped barrier. It is inserted into the vagina before sex, along with a spermicide. The diaphragm blocks sperm from entering the uterus, and the spermicide kills sperm. A diaphragm should be left in the vagina for 6-8 hours after sex and removed within 24 hours. A diaphragm is prescribed and fitted by a health care provider. A diaphragm should be replaced every 1-2 years, after giving birth, after gaining more than 15 lb (6.8 kg), and after pelvic surgery. Cervical cap A cervical cap is a round, soft latex or plastic cup that fits over the cervix. It is inserted into the vagina before sex, along with spermicide. It blocks sperm from entering the uterus. The cap should be left in place for 6-8 hours after sex and removed within 48 hours. A cervical cap must be prescribed and fitted by a health care provider. It should be replaced every 2 years. Sponge A sponge is a soft, circular piece of polyurethane foam with spermicide in it. The sponge helps block sperm from entering the uterus, and the spermicide kills sperm. To use it, you make it wet and then insert it into the vagina. It should be inserted before sex, left in for at least 6 hours after sex, and removed and thrown away within 30 hours. Spermicides Spermicides are chemicals that kill or block sperm from entering the  cervix and uterus. They can come as a cream, jelly, suppository, foam, or tablet. A spermicide should be inserted into the vagina with an applicator at least 41-66 minutes before sex to allow time for it to work. The process must be repeated every time you have sex. Spermicides do not require a prescription.   Intrauterine  contraception Intrauterine device (IUD) An IUD is a T-shaped device that is put in a woman's uterus. There are two types:  Hormone IUD.This type contains progestin, a synthetic form of the hormone progesterone. This type can stay in place for 3-5 years.  Copper IUD.This type is wrapped in copper wire. It can stay in place for 10 years. Permanent methods of contraception Female tubal ligation In this method, a woman's fallopian tubes are sealed, tied, or blocked during surgery to prevent eggs from traveling to the uterus. Hysteroscopic sterilization In this method, a small, flexible insert is placed into each fallopian tube. The inserts cause scar tissue to form in the fallopian tubes and block them, so sperm cannot reach an egg. The procedure takes about 3 months to be effective. Another form of birth control must be used during those 3 months. Female sterilization This is a procedure to tie off the tubes that carry sperm (vasectomy). After the procedure, the man can still ejaculate fluid (semen). Another form of birth control must be used for 3 months after the procedure. Natural planning methods Natural family planning In this method, a couple does not have sex on days when the woman could become pregnant. Calendar method In this method, the woman keeps track of the length of each menstrual cycle, identifies the days when pregnancy can happen, and does not have sex on those days. Ovulation method In this method, a couple avoids sex during ovulation. Symptothermal method This method involves not having sex during ovulation. The woman typically checks for ovulation by watching changes in her temperature and in the consistency of cervical mucus. Post-ovulation method In this method, a couple waits to have sex until after ovulation. Where to find more information  Centers for Disease Control and Prevention: http://www.wolf.info/ Summary  Contraception, also called birth control, refers to methods or  devices that prevent pregnancy.  Hormonal methods of contraception include implants, injections, pills, patches, vaginal rings, and emergency contraceptives.  Barrier methods of contraception can include female condoms, female condoms, diaphragms, cervical caps, sponges, and spermicides.  There are two types of IUDs (intrauterine devices). An IUD can be put in a woman's uterus to prevent pregnancy for 3-5 years.  Permanent sterilization can be done through a procedure for males and females. Natural family planning methods involve nothaving sex on days when the woman could become pregnant. This information is not intended to replace advice given to you by your health care provider. Make sure you discuss any questions you have with your health care provider. Document Revised: 09/26/2019 Document Reviewed: 09/26/2019 Elsevier Patient Education  [employer].   Breastfeeding  Choosing to breastfeed is one of the best decisions you can make for yourself and your baby. A change in hormones during pregnancy causes your breasts to make breast milk in your milk-producing glands. Hormones prevent breast milk from being released before your baby is born. They also prompt milk flow after birth. Once breastfeeding has begun, thoughts of your baby, as well as his or her sucking or crying, can stimulate the release of milk from your milk-producing glands. Benefits of breastfeeding Research shows that breastfeeding offers many health benefits  for infants and mothers. It also offers a cost-free and convenient way to feed your baby. For your baby  Your first milk (colostrum) helps your baby's digestive system to function better.  Special cells in your milk (antibodies) help your baby to fight off infections.  Breastfed babies are less likely to develop asthma, allergies, obesity, or type 2 diabetes. They are also at lower risk for sudden infant death syndrome (SIDS).  Nutrients in breast milk are better  able to meet your baby's needs compared to infant formula.  Breast milk improves your baby's brain development. For you  Breastfeeding helps to create a very special bond between you and your baby.  Breastfeeding is convenient. Breast milk costs nothing and is always available at the correct temperature.  Breastfeeding helps to burn calories. It helps you to lose the weight that you gained during pregnancy.  Breastfeeding makes your uterus return faster to its size before pregnancy. It also slows bleeding (lochia) after you give birth.  Breastfeeding helps to lower your risk of developing type 2 diabetes, osteoporosis, rheumatoid arthritis, cardiovascular disease, and breast, ovarian, uterine, and endometrial cancer later in life. Breastfeeding basics Starting breastfeeding  Find a comfortable place to sit or lie down, with your neck and back well-supported.  Place a pillow or a rolled-up blanket under your baby to bring him or her to the level of your breast (if you are seated). Nursing pillows are specially designed to help support your arms and your baby while you breastfeed.  Make sure that your baby's tummy (abdomen) is facing your abdomen.  Gently massage your breast. With your fingertips, massage from the outer edges of your breast inward toward the nipple. This encourages milk flow. If your milk flows slowly, you may need to continue this action during the feeding.  Support your breast with 4 fingers underneath and your thumb above your nipple (make the letter "C" with your hand). Make sure your fingers are well away from your nipple and your baby's mouth.  Stroke your baby's lips gently with your finger or nipple.  When your baby's mouth is open wide enough, quickly bring your baby to your breast, placing your entire nipple and as much of the areola as possible into your baby's mouth. The areola is the colored area around your nipple. ? More areola should be visible above your  baby's upper lip than below the lower lip. ? Your baby's lips should be opened and extended outward (flanged) to ensure an adequate, comfortable latch. ? Your baby's tongue should be between his or her lower gum and your breast.  Make sure that your baby's mouth is correctly positioned around your nipple (latched). Your baby's lips should create a seal on your breast and be turned out (everted).  It is common for your baby to suck about 2-3 minutes in order to start the flow of breast milk. Latching Teaching your baby how to latch onto your breast properly is very important. An improper latch can cause nipple pain, decreased milk supply, and poor weight gain in your baby. Also, if your baby is not latched onto your nipple properly, he or she may swallow some air during feeding. This can make your baby fussy. Burping your baby when you switch breasts during the feeding can help to get rid of the air. However, teaching your baby to latch on properly is still the best way to prevent fussiness from swallowing air while breastfeeding. Signs that your baby has successfully latched onto  your nipple  Silent tugging or silent sucking, without causing you pain. Infant's lips should be extended outward (flanged).  Swallowing heard between every 3-4 sucks once your milk has started to flow (after your let-down milk reflex occurs).  Muscle movement above and in front of his or her ears while sucking. Signs that your baby has not successfully latched onto your nipple  Sucking sounds or smacking sounds from your baby while breastfeeding.  Nipple pain. If you think your baby has not latched on correctly, slip your finger into the corner of your baby's mouth to break the suction and place it between your baby's gums. Attempt to start breastfeeding again. Signs of successful breastfeeding Signs from your baby  Your baby will gradually decrease the number of sucks or will completely stop sucking.  Your baby  will fall asleep.  Your baby's body will relax.  Your baby will retain a small amount of milk in his or her mouth.  Your baby will let go of your breast by himself or herself. Signs from you  Breasts that have increased in firmness, weight, and size 1-3 hours after feeding.  Breasts that are softer immediately after breastfeeding.  Increased milk volume, as well as a change in milk consistency and color by the fifth day of breastfeeding.  Nipples that are not sore, cracked, or bleeding. Signs that your baby is getting enough milk  Wetting at least 1-2 diapers during the first 24 hours after birth.  Wetting at least 5-6 diapers every 24 hours for the first week after birth. The urine should be clear or pale yellow by the age of 5 days.  Wetting 6-8 diapers every 24 hours as your baby continues to grow and develop.  At least 3 stools in a 24-hour period by the age of 5 days. The stool should be soft and yellow.  At least 3 stools in a 24-hour period by the age of 7 days. The stool should be seedy and yellow.  No loss of weight greater than 10% of birth weight during the first 3 days of life.  Average weight gain of 4-7 oz (113-198 g) per week after the age of 4 days.  Consistent daily weight gain by the age of 5 days, without weight loss after the age of 2 weeks. After a feeding, your baby may spit up a small amount of milk. This is normal. Breastfeeding frequency and duration Frequent feeding will help you make more milk and can prevent sore nipples and extremely full breasts (breast engorgement). Breastfeed when you feel the need to reduce the fullness of your breasts or when your baby shows signs of hunger. This is called "breastfeeding on demand." Signs that your baby is hungry include:  Increased alertness, activity, or restlessness.  Movement of the head from side to side.  Opening of the mouth when the corner of the mouth or cheek is stroked (rooting).  Increased  sucking sounds, smacking lips, cooing, sighing, or squeaking.  Hand-to-mouth movements and sucking on fingers or hands.  Fussing or crying. Avoid introducing a pacifier to your baby in the first 4-6 weeks after your baby is born. After this time, you may choose to use a pacifier. Research has shown that pacifier use during the first year of a baby's life decreases the risk of sudden infant death syndrome (SIDS). Allow your baby to feed on each breast as long as he or she wants. When your baby unlatches or falls asleep while feeding from the  first breast, offer the second breast. Because newborns are often sleepy in the first few weeks of life, you may need to awaken your baby to get him or her to feed. Breastfeeding times will vary from baby to baby. However, the following rules can serve as a guide to help you make sure that your baby is properly fed:  Newborns (babies 33 weeks of age or younger) may breastfeed every 1-3 hours.  Newborns should not go without breastfeeding for longer than 3 hours during the day or 5 hours during the night.  You should breastfeed your baby a minimum of 8 times in a 24-hour period. Breast milk pumping Pumping and storing breast milk allows you to make sure that your baby is exclusively fed your breast milk, even at times when you are unable to breastfeed. This is especially important if you go back to work while you are still breastfeeding, or if you are not able to be present during feedings. Your lactation consultant can help you find a method of pumping that works best for you and give you guidelines about how long it is safe to store breast milk.      Caring for your breasts while you breastfeed Nipples can become dry, cracked, and sore while breastfeeding. The following recommendations can help keep your breasts moisturized and healthy:  Avoid using soap on your nipples.  Wear a supportive bra designed especially for nursing. Avoid wearing underwire-style  bras or extremely tight bras (sports bras).  Air-dry your nipples for 3-4 minutes after each feeding.  Use only cotton bra pads to absorb leaked breast milk. Leaking of breast milk between feedings is normal.  Use lanolin on your nipples after breastfeeding. Lanolin helps to maintain your skin's normal moisture barrier. Pure lanolin is not harmful (not toxic) to your baby. You may also hand express a few drops of breast milk and gently massage that milk into your nipples and allow the milk to air-dry. In the first few weeks after giving birth, some women experience breast engorgement. Engorgement can make your breasts feel heavy, warm, and tender to the touch. Engorgement peaks within 3-5 days after you give birth. The following recommendations can help to ease engorgement:  Completely empty your breasts while breastfeeding or pumping. You may want to start by applying warm, moist heat (in the shower or with warm, water-soaked hand towels) just before feeding or pumping. This increases circulation and helps the milk flow. If your baby does not completely empty your breasts while breastfeeding, pump any extra milk after he or she is finished.  Apply ice packs to your breasts immediately after breastfeeding or pumping, unless this is too uncomfortable for you. To do this: ? Put ice in a plastic bag. ? Place a towel between your skin and the bag. ? Leave the ice on for 20 minutes, 2-3 times a day.  Make sure that your baby is latched on and positioned properly while breastfeeding. If engorgement persists after 48 hours of following these recommendations, contact your health care provider or a Science writer. Overall health care recommendations while breastfeeding  Eat 3 healthy meals and 3 snacks every day. Well-nourished mothers who are breastfeeding need an additional 450-500 calories a day. You can meet this requirement by increasing the amount of a balanced diet that you eat.  Drink  enough water to keep your urine pale yellow or clear.  Rest often, relax, and continue to take your prenatal vitamins to prevent fatigue, stress, and low  vitamin and mineral levels in your body (nutrient deficiencies).  Do not use any products that contain nicotine or tobacco, such as cigarettes and e-cigarettes. Your baby may be harmed by chemicals from cigarettes that pass into breast milk and exposure to secondhand smoke. If you need help quitting, ask your health care provider.  Avoid alcohol.  Do not use illegal drugs or marijuana.  Talk with your health care provider before taking any medicines. These include over-the-counter and prescription medicines as well as vitamins and herbal supplements. Some medicines that may be harmful to your baby can pass through breast milk.  It is possible to become pregnant while breastfeeding. If birth control is desired, ask your health care provider about options that will be safe while breastfeeding your baby. Where to find more information: [employer]: www.llli.org Contact a health care provider if:  You feel like you want to stop breastfeeding or have become frustrated with breastfeeding.  Your nipples are cracked or bleeding.  Your breasts are red, tender, or warm.  You have: ? Painful breasts or nipples. ? A swollen area on either breast. ? A fever or chills. ? Nausea or vomiting. ? Drainage other than breast milk from your nipples.  Your breasts do not become full before feedings by the fifth day after you give birth.  You feel sad and depressed.  Your baby is: ? Too sleepy to eat well. ? Having trouble sleeping. ? More than 64 week old and wetting fewer than 6 diapers in a 24-hour period. ? Not gaining weight by 52 days of age.  Your baby has fewer than 3 stools in a 24-hour period.  Your baby's skin or the white parts of his or her eyes become yellow. Get help right away if:  Your baby is overly tired  (lethargic) and does not want to wake up and feed.  Your baby develops an unexplained fever. Summary  Breastfeeding offers many health benefits for infant and mothers.  Try to breastfeed your infant when he or she shows early signs of hunger.  Gently tickle or stroke your baby's lips with your finger or nipple to allow the baby to open his or her mouth. Bring the baby to your breast. Make sure that much of the areola is in your baby's mouth. Offer one side and burp the baby before you offer the other side.  Talk with your health care provider or lactation consultant if you have questions or you face problems as you breastfeed. This information is not intended to replace advice given to you by your health care provider. Make sure you discuss any questions you have with your health care provider. Document Revised: 07/16/2017 Document Reviewed: 05/23/2016 Elsevier Patient Education  2021 [employer].

## 2020-06-07 NOTE — Progress Notes (Signed)
   Subjective:  Kelly Lane is a 25 y.o. G2P0010 at [redacted]w[redacted]d being seen today for ongoing prenatal care.  She is currently monitored for the following issues for this low-risk pregnancy and has Endometriosis and Supervision of normal pregnancy, antepartum on their problem list.  Patient reports nausea and pelvic discomfort.  Contractions: Not present. Vag. Bleeding: None.  Movement: Present. Denies leaking of fluid.   The following portions of the patient's history were reviewed and updated as appropriate: allergies, current medications, past family history, past medical history, past social history, past surgical history and problem list. Problem list updated.  Objective:   Vitals:   06/07/20 0907  BP: 115/78  Pulse: 89  Weight: 145 lb 9.6 oz (66 kg)    Fetal Status: Fetal Heart Rate (bpm): 153   Movement: Present     General:  Alert, oriented and cooperative. Patient is in no acute distress.  Skin: Skin is warm and dry. No rash noted.   Cardiovascular: Normal heart rate noted  Respiratory: Normal respiratory effort, no problems with respiration noted  Abdomen: Soft, gravid, appropriate for gestational age. Pain/Pressure: Absent     Pelvic: Vag. Bleeding: None     Cervical exam deferred        Extremities: Normal range of motion.  Edema: None  Mental Status: Normal mood and affect. Normal behavior. Normal judgment and thought content.   Urinalysis:      Assessment and Plan:  Pregnancy: G2P0010 at [redacted]w[redacted]d  1. Supervision of normal first pregnancy, antepartum BP and FHR normal Some discomfort from advancing gestational age and possibly pubic symphisis pain, discussed strategies, trial flexeril Refill for zofran for ongoing occasional nausea Reviewed prenatal labs from last visit, all questions answered  Preterm labor symptoms and general obstetric precautions including but not limited to vaginal bleeding, contractions, leaking of fluid and fetal movement were reviewed in detail  with the patient. Please refer to After Visit Summary for other counseling recommendations.  Return in 4 weeks (on 07/05/2020).   Clarnce Flock, MD

## 2020-06-11 ENCOUNTER — Other Ambulatory Visit: Payer: Self-pay | Admitting: Obstetrics

## 2020-06-11 ENCOUNTER — Other Ambulatory Visit: Payer: Self-pay

## 2020-06-11 ENCOUNTER — Ambulatory Visit: Payer: BC Managed Care – PPO | Attending: Obstetrics

## 2020-06-11 DIAGNOSIS — O3412 Maternal care for benign tumor of corpus uteri, second trimester: Secondary | ICD-10-CM

## 2020-06-11 DIAGNOSIS — Z3490 Encounter for supervision of normal pregnancy, unspecified, unspecified trimester: Secondary | ICD-10-CM

## 2020-06-11 DIAGNOSIS — O358XX0 Maternal care for other (suspected) fetal abnormality and damage, not applicable or unspecified: Secondary | ICD-10-CM

## 2020-06-11 DIAGNOSIS — Z3A20 20 weeks gestation of pregnancy: Secondary | ICD-10-CM | POA: Diagnosis not present

## 2020-06-11 DIAGNOSIS — Z363 Encounter for antenatal screening for malformations: Secondary | ICD-10-CM | POA: Diagnosis not present

## 2020-06-12 ENCOUNTER — Other Ambulatory Visit: Payer: Self-pay | Admitting: *Deleted

## 2020-06-12 DIAGNOSIS — O283 Abnormal ultrasonic finding on antenatal screening of mother: Secondary | ICD-10-CM

## 2020-06-13 ENCOUNTER — Encounter: Payer: Self-pay | Admitting: Obstetrics

## 2020-06-18 ENCOUNTER — Encounter: Payer: Medicaid Other | Admitting: Licensed Clinical Social Worker

## 2020-06-18 ENCOUNTER — Telehealth: Payer: Self-pay | Admitting: Licensed Clinical Social Worker

## 2020-06-18 NOTE — Telephone Encounter (Signed)
Called pt twice regarding scheduled mychart visit. Left message for callback

## 2020-06-27 ENCOUNTER — Ambulatory Visit: Payer: Self-pay | Admitting: Physician Assistant

## 2020-06-27 NOTE — Telephone Encounter (Signed)
Pt reports eye pain, both eyes and photophobia. States sudden onset today. Denies headache, "The eyes only are painful." States "Really hurts to look at light." Reports 8/10 pain. States left eye with clear mucous drainage. Pt is 5 months pregnant. Advised UC. States will follow disposition. Care advise given. Reason for Disposition . [1] Eye pain/discomfort AND [2] more than mild  Answer Assessment - Initial Assessment Questions 1. ONSET: "When did the pain start?" (e.g., minutes, hours, days)     Today 2. TIMING: "Does the pain come and go, or has it been constant since it started?" (e.g., constant, intermittent, fleeting)    Constant 3. SEVERITY: "How bad is the pain?"   (Scale 1-10; mild, moderate or severe)   - MILD (1-3): doesn't interfere with normal activities    - MODERATE (4-7): interferes with normal activities or awakens from sleep    - SEVERE (8-10): excruciating pain and patient unable to do normal activities    8/10 4. LOCATION: "Where does it hurt?"  (e.g., eyelid, eye, cheekboneght  Both eyes are painful 5. CAUSE: "What do you think is causing the pain?"    Unsure 6. VISION: "Do you have blurred vision or changes in your vision?"     Sensitive to light 7. EYE DISCHARGE: "Is there any discharge (pus) from the eye(s)?"  If yes, ask: "What color is it?"      Left eye draining clear mucous type water 8. FEVER: "Do you have a fever?" If Yes, ask: "What is it, how was it measured, and when did it start?"      no 9. OTHER SYMPTOMS: "Do you have any other symptoms?" (e.g., headache, nasal discharge, facial rash)     Sensitive to light 10. PREGNANCY: "Is there any chance you are pregnant?" "When was your last menstrual period?"       *She is 5 months pregnant  Protocols used: EYE PAIN-A-AH

## 2020-06-28 NOTE — Telephone Encounter (Signed)
FYI-note patient is pregnant and I do not see that she went to any [place]

## 2020-07-05 ENCOUNTER — Ambulatory Visit (INDEPENDENT_AMBULATORY_CARE_PROVIDER_SITE_OTHER): Payer: BC Managed Care – PPO

## 2020-07-05 ENCOUNTER — Other Ambulatory Visit: Payer: Self-pay

## 2020-07-05 VITALS — BP 127/88 | HR 97 | Wt 150.0 lb

## 2020-07-05 DIAGNOSIS — Z3A22 22 weeks gestation of pregnancy: Secondary | ICD-10-CM

## 2020-07-05 DIAGNOSIS — Z3400 Encounter for supervision of normal first pregnancy, unspecified trimester: Secondary | ICD-10-CM

## 2020-07-05 DIAGNOSIS — O99619 Diseases of the digestive system complicating pregnancy, unspecified trimester: Secondary | ICD-10-CM

## 2020-07-05 DIAGNOSIS — F32A Depression, unspecified: Secondary | ICD-10-CM

## 2020-07-05 DIAGNOSIS — O99340 Other mental disorders complicating pregnancy, unspecified trimester: Secondary | ICD-10-CM

## 2020-07-05 DIAGNOSIS — O99891 Other specified diseases and conditions complicating pregnancy: Secondary | ICD-10-CM

## 2020-07-05 DIAGNOSIS — K5900 Constipation, unspecified: Secondary | ICD-10-CM

## 2020-07-05 DIAGNOSIS — N949 Unspecified condition associated with female genital organs and menstrual cycle: Secondary | ICD-10-CM

## 2020-07-05 LAB — POCT URINALYSIS DIPSTICK
Bilirubin, UA: NEGATIVE
Blood, UA: NEGATIVE
Glucose, UA: NEGATIVE
Ketones, UA: NEGATIVE
Leukocytes, UA: NEGATIVE
Nitrite, UA: NEGATIVE
Protein, UA: NEGATIVE
Spec Grav, UA: 1.025 (ref 1.010–1.025)
Urobilinogen, UA: 0.2 E.U./dL
pH, UA: 6 (ref 5.0–8.0)

## 2020-07-05 MED ORDER — CETIRIZINE HCL 10 MG PO TABS: 10.0000 mg | ORAL_TABLET | Freq: Every day | ORAL | 4 refills | Status: DC

## 2020-07-05 NOTE — Progress Notes (Signed)
+   Fetal movement. Pt states she is having increased back pain and pelvic pressure. Pt does not currently have a BP cuff. Will send that in to [place].

## 2020-07-05 NOTE — Patient Instructions (Addendum)
Fiber Content in Foods Fiber is a substance that is found in plant foods, such as fruits, vegetables, whole grains, nuts, seeds, and beans. As part of your treatment and recovery plan, your health care provider may recommend that you eat foods that have specific amounts of dietary fiber. Some conditions may require a high-fiber diet while others may require a low-fiber diet. This sheet gives you information about the dietary fiber content of some common foods. Your health care provider will tell you how much fiber you need in your diet. If you have problems or questions, contact your health care provider or dietitian. What foods are high in fiber? Fruits  Blackberries or raspberries (fresh) --  cup (75 g) has 4 g of fiber.  Pear (fresh) -- 1 medium (180 g) has 5.5 g of fiber.  Prunes (dried) -- 6 to 8 pieces (57-76 g) has 5 g of fiber.  Apple with skin -- 1 medium (182 g) has 4.8 g of fiber.  Guava -- 1 cup (128 g) has 8.9 g of fiber. Vegetables  Peas (frozen) --  cup (80 g) has 4.4 g of fiber.  Potato with skin (baked) -- 1 medium (173 g) has 4.4 g of fiber.  Pumpkin (canned) --  cup (122 g) has 5 g of fiber.  Brussels sprouts (cooked) --  cup (78 g) has 4 g of fiber.  Sweet potato --  cup mashed (124 g) has 4 g of fiber.  Winter squash -- 1 cup cooked (205 g) has 5.7 g of fiber. Grains  Bran cereal --  cup (31 g) has 8.6 g of fiber.  Bulgur (cooked) --  cup (70 g) has 4 g of fiber.  Quinoa (cooked) -- 1 cup (185 g) has 5.2 g of fiber.  Popcorn -- 3 cups (375 g) popped has 5.8 g of fiber.  Spaghetti, whole wheat -- 1 cup (140 g) has 6 g of fiber. Meats and other proteins  Pinto beans (cooked) --  cup (90 g) has 7.7 g of fiber.  Lentils (cooked) --  cup (90 g) has 7.8 g of fiber.  Kidney beans (canned) --  cup (92.5 g) has 5.7 g of fiber.  Soybeans (canned, frozen, or fresh) --  cup (92.5 g) has 5.2 g of fiber.  Baked beans, plain or vegetarian (canned) --   cup (130 g) has 5.2 g of fiber.  Garbanzo beans or chickpeas (canned) --  cup (90 g) has 6.6 g of fiber.  Black beans (cooked) --  cup (86 g) has 7.5 g of fiber.  White beans or navy beans (cooked) --  cup (91 g) has 9.3 g of fiber. The items listed above may not be a complete list of foods with high fiber. Actual amounts of fiber may be different depending on processing. Contact a dietitian for more information.   What foods are moderate in fiber? Fruits  Banana -- 1 medium (126 g) has 3.2 g of fiber.  Melon -- 1 cup (155 g) has 1.4 g of fiber.  Orange -- 1 small (154 g) has 3.7 g of fiber.  Raisins --  cup (40 g) has 1.8 g of fiber.  Applesauce, sweetened --  cup (125 g) has 1.5 g of fiber.  Blueberries (fresh) --  cup (75 g) has 1.8 g of fiber.  Strawberries (fresh, sliced) -- 1 cup (150 g) has 3 g of fiber.  Cherries -- 1 cup (140 g) has 2.9 g of fiber. Vegetables    Broccoli (cooked) --  cup (77.5 g) has 2.1 g of fiber.  Carrots (cooked) --  cup (77.5 g) has 2.2 g of fiber.  Corn (canned or frozen) --  cup (82.5 g) has 2.1 g of fiber.  Potatoes, mashed --  cup (105 g) has 1.6 g of fiber.  Tomato -- 1 medium (62 g) has 1.5 g of fiber.  Green beans (canned) --  cup (83 g) has 2 g of fiber.  Squash, winter --  cup (58 g) has 1 g of fiber.  Sweet potato, baked -- 1 medium (150 g) has 3 g of fiber.  Cauliflower (cooked) -- 1/2 cup (90 g) has 2.3 g of fiber. Grains  Long-grain brown rice (cooked) -- 1 cup (196 g) has 3.5 g of fiber.  Bagel, plain -- one 4-inch (10 cm) bagel has 2 g of fiber.  Instant oatmeal --  cup (120 g) has about 2 g of fiber.  Macaroni noodles, enriched (cooked) -- 1 cup (140 g) has 2.5 g of fiber.  Multigrain cereal --  cup (15 g) has about 2-4 g of fiber.  Whole-wheat bread -- 1 slice (26 g) has 2 g of fiber.  Whole-wheat spaghetti noodles --  cup (70 g) has 3.2 g of fiber.  Corn tortilla -- one 6-inch (15 cm) tortilla  has 1.5 g of fiber. Meats and other proteins  Almonds --  cup or 1 oz (28 g) has 3.5 g of fiber.  Sunflower seeds in shell --  cup or  oz (11.5 g) has 1.1 g of fiber.  Vegetable or soy patty -- 1 patty (70 g) has 3.4 g of fiber.  Walnuts --  cup or 1 oz (30 g) has 2 g of fiber.  Flax seed -- 1 Tbsp (7 g) has 2.8 g of fiber. The items listed above may not be a complete list of foods that have moderate amounts of fiber. Actual amounts of fiber may be different depending on processing. Contact a dietitian for more information.   What foods are low in fiber? Low-fiber foods contain less than 1 g of fiber per serving. They include: Fruits  Fruit juice --  cup or 4 fl oz (118 mL) has 0.5 g of fiber. Vegetables  Lettuce -- 1 cup (35 g) has 0.5 g of fiber.  Cucumber (slices) --  cup (60 g) has 0.3 g of fiber.  Celery -- 1 stalk (40 g) has 0.1 g of fiber. Grains  Flour tortilla -- one 6-inch (15 cm) tortilla has 0.5 g of fiber.  White rice (cooked) --  cup (81.5 g) has 0.3 g of fiber. Meats and other proteins  Egg -- 1 large (50 g) has 0 g of fiber.  Meat, poultry, or fish -- 3 oz (85 g) has 0 g of fiber. Dairy  Milk -- 1 cup or 8 fl oz (237 mL) has 0 g of fiber.  Yogurt -- 1 cup (245 g) has 0 g of fiber. The items listed above may not be a complete list of foods that are low in fiber. Actual amounts of fiber may be different depending on processing. Contact a dietitian for more information.   Summary  Fiber is a substance that is found in plant foods, such as fruits, vegetables, whole grains, nuts, seeds, and beans.  As part of your treatment and recovery plan, your health care provider may recommend that you eat foods that have specific amounts of dietary fiber. This information is   not intended to replace advice given to you by your health care provider. Make sure you discuss any questions you have with your health care provider. Document Revised: 08/25/2019 Document  Reviewed: 08/25/2019 Elsevier Patient Education  2021 [place]? Guide for patients at [facility]) Why consider waterbirth? . Gentle birth for babies  . Less pain medicine used in labor  . May allow for passive descent/less pushing  . May reduce perineal tears  . More mobility and instinctive maternal position changes  . Increased maternal relaxation   Is waterbirth safe? What are the risks of infection, drowning or other complications? . Infection:  Marland Kitchen Very low risk (3.7 % for tub vs 4.8% for bed)  . 7 in 44 waterbirths with documented infection  . Poorly cleaned equipment most common cause  . Slightly lower group B strep transmission rate  . Drowning  . Maternal:  . Very low risk  . Related to seizures or fainting  . Newborn:  Marland Kitchen Very low risk. No evidence of increased risk of respiratory problems in multiple large studies  . Physiological protection from breathing under water  . Avoid underwater birth if there are any fetal complications  . Once baby's head is out of the water, keep it out.  . Birth complication  . Some reports of cord trauma, but risk decreased by bringing baby to surface gradually  . No evidence of increased risk of shoulder dystocia. Mothers can usually change positions faster in water than in a bed, possibly aiding the maneuvers to free the shoulder.   There are 2 things you MUST do to have a waterbirth with [facility]: 1. Attend a waterbirth class at [place] at [facility]   a. 3rd Wednesday of every month from 7-9 pm (virtual during Newborn) b. Free c. Register online at [email or web address] or [email or web address] or by calling [phone] d. Bring [place] the certificate from the class to your prenatal appointment or send via MyChart 2. Meet with a midwife at 36 weeks* to see if you can still plan a waterbirth and to sign the consent.   *We also recommend that you schedule as many  of your prenatal visits with a midwife as possible.    Helpful information: . You may want to bring a bathing suit top to the hospital to wear during labor but this is optional.  All other supplies are provided by the hospital. . Please arrive at the hospital with signs of active labor, and do not wait at home until late in labor. It takes 45 min- 2 hours for COVID testing, fetal monitoring, and check in to your room to take place, plus transport and filling of the waterbirth tub.    Things that would prevent you from having a waterbirth: . Unknown or Positive COVID-19 diagnosis upon admission to hospital* . Premature, <37wks  . Previous cesarean birth  . Presence of thick meconium-stained fluid  . Multiple gestation (Twins, triplets, etc.)  . Uncontrolled diabetes or gestational diabetes requiring medication  . Hypertension diagnosed in pregnancy or preexisting hypertension (gestational hypertension, preeclampsia, or chronic hypertension) . Heavy vaginal bleeding  . Non-reassuring fetal heart rate  . Active infection (MRSA, etc.). Group B Strep is NOT a contraindication for waterbirth.  . If your labor has to be induced and induction method requires continuous monitoring of the baby's heart rate  . Other risks/issues identified by your obstetrical provider   Please remember that birth is unpredictable.  Under certain unforeseeable circumstances your provider may advise against giving birth in the tub. These decisions will be made on a case-by-case basis and with the safety of you and your baby as our highest priority.   *Please remember that in order to have a waterbirth, you must test Negative to COVID-19 upon admission to the hospital.  Updated 03/20/2020   Oral Glucose Tolerance Test During Pregnancy Why am I having this test? The oral glucose tolerance test (OGTT) is done to check how your body processes blood sugar (glucose). This is one of several tests used to diagnose diabetes  that develops during pregnancy (gestational diabetes mellitus). Gestational diabetes is a short-term form of diabetes that some women develop while they are pregnant. It usually occurs during the second trimester of pregnancy and goes away after delivery. Testing, or screening, for gestational diabetes usually occurs at weeks 24-28 of pregnancy. You may have the OGTT test after having a 1-hour glucose screening test if the results from that test indicate that you may have gestational diabetes. This test may also be needed if: You have a history of gestational diabetes. There is a history of giving birth to very large babies or of losing pregnancies (having stillbirths). You have signs and symptoms of diabetes, such as: Changes in your eyesight. Tingling or numbness in your hands or feet. Changes in hunger, thirst, and urination, and these are not explained by your pregnancy. What is being tested? This test measures the amount of glucose in your blood at different times during a period of 3 hours. This shows how well your body can process glucose. What kind of sample is taken? Blood samples are required for this test. They are usually collected by inserting a needle into a blood vessel.   How do I prepare for this test? For 3 days before your test, eat normally. Have plenty of carbohydrate-rich foods. Follow instructions from your health care provider about: Eating or drinking restrictions on the day of the test. You may be asked not to eat or drink anything other than water (to fast) starting 8-10 hours before the test. Changing or stopping your regular medicines. Some medicines may interfere with this test. Tell a health care provider about: All medicines you are taking, including vitamins, herbs, eye drops, creams, and over-the-counter medicines. Any blood disorders you have. Any surgeries you have had. Any medical conditions you have. What happens during the test? First, your blood glucose  will be measured. This is referred to as your fasting blood glucose because you fasted before the test. Then, you will drink a glucose solution that contains a certain amount of glucose. Your blood glucose will be measured again 1, 2, and 3 hours after you drink the solution. This test takes about 3 hours to complete. You will need to stay at the testing location during this time. During the testing period: Do not eat or drink anything other than the glucose solution. Do not exercise. Do not use any products that contain nicotine or tobacco, such as cigarettes, e-cigarettes, and chewing tobacco. These can affect your test results. If you need help quitting, ask your health care provider. The testing procedure may vary among health care providers and hospitals. How are the results reported? Your results will be reported as milligrams of glucose per deciliter of blood (mg/dL) or millimoles per liter (mmol/L). There is more than one source for screening and diagnosis reference values used to diagnose gestational diabetes. Your health care provider will compare your  results to normal values that were established after testing a large group of people (reference values). Reference values may vary among labs and hospitals. For this test (Carpenter-Coustan), reference values are: Fasting: 95 mg/dL (5.3 mmol/L). 1 hour: 180 mg/dL (10.0 mmol/L). 2 hour: 155 mg/dL (8.6 mmol/L). 3 hour: 140 mg/dL (7.8 mmol/L). What do the results mean? Results below the reference values are considered normal. If two or more of your blood glucose levels are at or above the reference values, you may be diagnosed with gestational diabetes. If only one level is high, your health care provider may suggest repeat testing or other tests to confirm a diagnosis. Talk with your health care provider about what your results mean. Questions to ask your health care provider Ask your health care provider, or the department that is doing the  test: When will my results be ready? How will I get my results? What are my treatment options? What other tests do I need? What are my next steps? Summary The oral glucose tolerance test (OGTT) is one of several tests used to diagnose diabetes that develops during pregnancy (gestational diabetes mellitus). Gestational diabetes is a short-term form of diabetes that some women develop while they are pregnant. You may have the OGTT test after having a 1-hour glucose screening test if the results from that test show that you may have gestational diabetes. You may also have this test if you have any symptoms or risk factors for this type of diabetes. Talk with your health care provider about what your results mean. This information is not intended to replace advice given to you by your health care provider. Make sure you discuss any questions you have with your health care provider. Document Revised: 09/29/2019 Document Reviewed: 09/29/2019 Elsevier Patient Education  2021 [employer].

## 2020-07-05 NOTE — Progress Notes (Signed)
LOW-RISK PREGNANCY OFFICE VISIT  Patient name: Kelly Lane MRN [id number]  Date of birth: 1996/02/09 Chief Complaint:   Routine Prenatal Visit  Subjective:   Kelly Lane is a 25 y.o. G87P0010 female at [redacted]w[redacted]d with an Estimated Date of Delivery: 11/03/20 being seen today for ongoing management of a low-risk pregnancy aeb has Endometriosis; Supervision of normal pregnancy, antepartum; and Rh negative state in antepartum period on their problem list.  Patient presents today with Kelly Lane who is the anticipated adoptive mother of her expected child. Patient with complaint of round ligament pain. Patient with several questions relating to pregnancy including exercise, waterbirth, and pregnancy related rhinitis.  Patient endorses fetal movement and denies vaginal concerns including abnormal discharge, leaking of fluid, and bleeding. Patient endorses constipation with last bowel movement was yesterday and was hard to pass.  Contractions: Not present. Vag. Bleeding: None.  Movement: Present.  Reviewed past medical,surgical, social, obstetrical and family history as well as problem list, medications and allergies.  Objective   Vitals:   07/05/20 1326 07/05/20 1337  BP: 130/90 127/88  Pulse: 97   Weight: 150 lb (68 kg)   Body mass index is 26.57 kg/m.  Total Weight Gain:20 lb (9.072 kg)         Physical Examination:   General appearance: Well appearing, and in no distress  Mental status: Alert, oriented to person, place, and time  Skin: Warm & dry  Cardiovascular: Normal heart rate noted  Respiratory: Normal respiratory effort, no distress  Abdomen: Soft, gravid, nontender, AGA with Fundal height 3 FB above the umbilicus.   Pelvic: Cervical exam deferred           Extremities: Edema: None  Fetal Status: Fetal Heart Rate (bpm): 161  Movement: Present   No results found for this or any previous visit (from the past 24 hour(s)).  Assessment & Plan:  Low-risk pregnancy of a 25 y.o., G2P0010 at  [redacted]w[redacted]d with an Estimated Date of Delivery: 11/03/20   1. Supervision of normal first pregnancy, antepartum -Admiration expressed, by provider, for patient's self-less and motherly act of planning to place her child with an adoptive family. -Informed that resources and LCSW are available for support throughout this process.  -Anticipatory guidance for upcoming appts. -Patient to next appt in 5-6 weeks for an in-person visit. -Reviewed anatomy US and discussed why follow up was scheduled. -Reviewed Glucola appt preparation including fasting the night before and morning of.   -Discussed anticipated office time of 2.5-3 hours.  -Reviewed blood draw procedures and labs which also include check of iron level.  -Discussed how results of GTT are handled including diabetic education and BS testing for abnormal results and routine care for normal results.    2. Round ligament pain -Reassured that c/o of lower bilateral intermittent sharp shooting pain is RLP, which is a normal physiological change in pregnancy.  -Extensive anatomy education given, with model usage, to help patient better understand location and cause of pain. -Informed that pains will worsen as pregnancy progresses.  3. [redacted] weeks gestation of pregnancy -Doing well overall. -Discussed exercise in pregnancy.  Encouraged to start gradually and increase as tolerated.  -Reviewed desire for WB. Discussed risk and benefits as well as exclusion criteria including, but not limited to midwife and equipment availability, fetal distress, and maternal diagnosis such as GHTN. -Discussed c/o allergic rhinitis.  Educated on how pregnancy is an immunocompromised state which could result in onset of seasonal or other allergies not previously experienced. -  Will send Rx for Cetirizine 10mg  for daily use.   4. Constipation during pregnancy, antepartum -Encouraged to increase water and fluid in diet. -Instructed to notify staff if constipation becomes severe  despite interventions so that medication can be implemented. -Informational sheet given.   5. Depression affecting pregnancy, antepartum -Patient denies current issues with coping. -Reports she missed her last appt with Kelly Lane. -Patient encouraged to contact her for setting up of next appt.   Meds: No orders of the defined types were placed in this encounter.  Labs/procedures today:  Lab Orders  No laboratory test(s) ordered today     Reviewed: Preterm labor symptoms and general obstetric precautions including but not limited to vaginal bleeding, contractions, leaking of fluid and fetal movement were reviewed in detail with the patient.  All questions were answered.  Follow-up: No follow-ups on file.  No orders of the defined types were placed in this encounter.  Maryann Conners MSN, CNM 07/05/2020

## 2020-07-06 DIAGNOSIS — M549 Dorsalgia, unspecified: Secondary | ICD-10-CM | POA: Insufficient documentation

## 2020-07-06 DIAGNOSIS — O99891 Other specified diseases and conditions complicating pregnancy: Secondary | ICD-10-CM | POA: Insufficient documentation

## 2020-07-07 LAB — URINE CULTURE

## 2020-07-09 ENCOUNTER — Other Ambulatory Visit: Payer: Self-pay

## 2020-07-09 ENCOUNTER — Encounter: Payer: Self-pay | Admitting: *Deleted

## 2020-07-09 ENCOUNTER — Ambulatory Visit: Payer: BC Managed Care – PPO | Admitting: *Deleted

## 2020-07-09 ENCOUNTER — Ambulatory Visit: Payer: BC Managed Care – PPO | Attending: Obstetrics

## 2020-07-09 VITALS — BP 120/82 | HR 92

## 2020-07-09 DIAGNOSIS — Z141 Cystic fibrosis carrier: Secondary | ICD-10-CM | POA: Diagnosis not present

## 2020-07-09 DIAGNOSIS — O288 Other abnormal findings on antenatal screening of mother: Secondary | ICD-10-CM

## 2020-07-09 DIAGNOSIS — O358XX0 Maternal care for other (suspected) fetal abnormality and damage, not applicable or unspecified: Secondary | ICD-10-CM | POA: Diagnosis not present

## 2020-07-09 DIAGNOSIS — O283 Abnormal ultrasonic finding on antenatal screening of mother: Secondary | ICD-10-CM | POA: Insufficient documentation

## 2020-07-09 DIAGNOSIS — O360120 Maternal care for anti-D [Rh] antibodies, second trimester, not applicable or unspecified: Secondary | ICD-10-CM

## 2020-07-09 DIAGNOSIS — Z3A23 23 weeks gestation of pregnancy: Secondary | ICD-10-CM

## 2020-07-09 DIAGNOSIS — O3412 Maternal care for benign tumor of corpus uteri, second trimester: Secondary | ICD-10-CM | POA: Diagnosis not present

## 2020-07-09 DIAGNOSIS — D259 Leiomyoma of uterus, unspecified: Secondary | ICD-10-CM

## 2020-08-09 ENCOUNTER — Other Ambulatory Visit: Payer: BC Managed Care – PPO

## 2020-08-09 ENCOUNTER — Other Ambulatory Visit: Payer: Self-pay

## 2020-08-09 ENCOUNTER — Encounter: Payer: BC Managed Care – PPO | Admitting: Obstetrics and Gynecology

## 2020-08-09 ENCOUNTER — Ambulatory Visit (INDEPENDENT_AMBULATORY_CARE_PROVIDER_SITE_OTHER): Payer: BC Managed Care – PPO | Admitting: Obstetrics and Gynecology

## 2020-08-09 VITALS — BP 133/86 | HR 90 | Wt 159.0 lb

## 2020-08-09 DIAGNOSIS — Z23 Encounter for immunization: Secondary | ICD-10-CM | POA: Diagnosis not present

## 2020-08-09 DIAGNOSIS — Z3A27 27 weeks gestation of pregnancy: Secondary | ICD-10-CM

## 2020-08-09 DIAGNOSIS — R300 Dysuria: Secondary | ICD-10-CM

## 2020-08-09 DIAGNOSIS — O360920 Maternal care for other rhesus isoimmunization, second trimester, not applicable or unspecified: Secondary | ICD-10-CM

## 2020-08-09 DIAGNOSIS — O26899 Other specified pregnancy related conditions, unspecified trimester: Secondary | ICD-10-CM

## 2020-08-09 DIAGNOSIS — Z3400 Encounter for supervision of normal first pregnancy, unspecified trimester: Secondary | ICD-10-CM

## 2020-08-09 MED ORDER — RHO D IMMUNE GLOBULIN 1500 UNIT/2ML IJ SOSY
300.0000 ug | PREFILLED_SYRINGE | Freq: Once | INTRAMUSCULAR | Status: AC
  Administered 2020-08-09: 300 ug via INTRAMUSCULAR

## 2020-08-09 NOTE — Addendum Note (Signed)
Addended by: Cydney Ok B on: 08/09/2020 10:54 AM   Modules accepted: Orders

## 2020-08-09 NOTE — Patient Instructions (Signed)

## 2020-08-09 NOTE — Progress Notes (Signed)
   PRENATAL VISIT NOTE  Subjective:  Kelly Lane is a 25 y.o. G2P0010 at [redacted]w[redacted]d being seen today for ongoing prenatal care.  She is currently monitored for the following issues for this low-risk pregnancy and has Endometriosis; Supervision of normal pregnancy, antepartum; Rh negative state in antepartum period; and Back pain in pregnancy on their problem list.  Patient reports pelvic pressure when she stands or changes positions.  Contractions: Not present. Vag. Bleeding: None.  Movement: Present. Denies leaking of fluid.   The following portions of the patient's history were reviewed and updated as appropriate: allergies, current medications, past family history, past medical history, past social history, past surgical history and problem list.   Objective:   Vitals:   08/09/20 0952  BP: 133/86  Pulse: 90  Weight: 159 lb (72.1 kg)    Fetal Status: Fetal Heart Rate (bpm): 145   Movement: Present     General:  Alert, oriented and cooperative. Patient is in no acute distress.  Skin: Skin is warm and dry. No rash noted.   Cardiovascular: Normal heart rate noted  Respiratory: Normal respiratory effort, no problems with respiration noted  Abdomen: Soft, gravid, appropriate for gestational age.  Pain/Pressure: Present     Pelvic: Cervical exam deferred        Extremities: Normal range of motion.  Edema: Trace  Mental Status: Normal mood and affect. Normal behavior. Normal judgment and thought content.   Assessment and Plan:  Pregnancy: G2P0010 at [redacted]w[redacted]d  1. Supervision of normal first pregnancy, antepartum  - Glucose Tolerance, 2 Hours w/1 Hour - CBC - RPR - HIV Antibody (routine testing w rflx) - Rhogam and Tdap given today. -Discussed remaining OB visits, GBS at 35/36 weeks.  Preterm labor symptoms and general obstetric precautions including but not limited to vaginal bleeding, contractions, leaking of fluid and fetal movement were reviewed in detail with the patient. Please  refer to After Visit Summary for other counseling recommendations.   Return in about 2 weeks (around 08/23/2020), or for LROB.  No future appointments.  Noni Saupe, NP

## 2020-08-09 NOTE — Progress Notes (Signed)
+   Fetal movement. Pt states she is having increased pelvic pain and spotting on her legs. PHQ9 completed today, score 3.

## 2020-08-10 LAB — URINALYSIS
Bilirubin, UA: NEGATIVE
Ketones, UA: NEGATIVE
Leukocytes,UA: NEGATIVE
Nitrite, UA: NEGATIVE
RBC, UA: NEGATIVE
Specific Gravity, UA: >=1.03 — AB (ref 1.005–1.030)
Urobilinogen, Ur: 1 mg/dL (ref 0.2–1.0)
pH, UA: 5.5 (ref 5.0–7.5)

## 2020-08-10 LAB — CBC
Hematocrit: 34.1 % (ref 34.0–46.6)
Hemoglobin: 12 g/dL (ref 11.1–15.9)
MCH: 33.1 pg — ABNORMAL HIGH (ref 26.6–33.0)
MCHC: 35.2 g/dL (ref 31.5–35.7)
MCV: 94 fL (ref 79–97)
Platelets: 236 10*3/uL (ref 150–450)
RBC: 3.62 x10E6/uL — ABNORMAL LOW (ref 3.77–5.28)
RDW: 11.9 % (ref 11.7–15.4)
WBC: 8.7 10*3/uL (ref 3.4–10.8)

## 2020-08-10 LAB — GLUCOSE TOLERANCE, 2 HOURS W/ 1HR
Glucose, 1 hour: 108 mg/dL (ref 65–179)
Glucose, 2 hour: 94 mg/dL (ref 65–152)
Glucose, Fasting: 71 mg/dL (ref 65–91)

## 2020-08-10 LAB — HIV ANTIBODY (ROUTINE TESTING W REFLEX): HIV Screen 4th Generation wRfx: NONREACTIVE

## 2020-08-10 LAB — RPR: RPR Ser Ql: NONREACTIVE

## 2020-08-16 ENCOUNTER — Telehealth: Payer: Self-pay

## 2020-08-16 DIAGNOSIS — Z111 Encounter for screening for respiratory tuberculosis: Secondary | ICD-10-CM

## 2020-08-16 NOTE — Telephone Encounter (Signed)
Please advise 

## 2020-08-16 NOTE — Telephone Encounter (Signed)
Copied from [place] [id number]. Topic: General - Other >> Aug 16, 2020  1:25 PM Celene Kras wrote: Reason for CRM: Pt called and is requesting to have orders placed to receive a TB test. Please advise.

## 2020-08-16 NOTE — Addendum Note (Signed)
Addended by: Shawna Orleans on: 08/16/2020 04:41 PM   Modules accepted: Orders

## 2020-08-16 NOTE — Telephone Encounter (Signed)
Patient requested lab test to be canceled.

## 2020-08-16 NOTE — Telephone Encounter (Signed)
Quantiferon gold

## 2020-08-22 ENCOUNTER — Inpatient Hospital Stay (HOSPITAL_COMMUNITY)
Admission: AD | Admit: 2020-08-22 | Discharge: 2020-08-22 | Disposition: A | Discharge status: Home / Self Care | Payer: BC Managed Care – PPO | Attending: Obstetrics & Gynecology | Admitting: Obstetrics & Gynecology

## 2020-08-22 ENCOUNTER — Encounter: Payer: BC Managed Care – PPO | Admitting: Family Medicine

## 2020-08-22 ENCOUNTER — Other Ambulatory Visit: Payer: Self-pay

## 2020-08-22 ENCOUNTER — Encounter (HOSPITAL_COMMUNITY): Payer: Self-pay | Admitting: Obstetrics & Gynecology

## 2020-08-22 DIAGNOSIS — R109 Unspecified abdominal pain: Secondary | ICD-10-CM | POA: Diagnosis not present

## 2020-08-22 DIAGNOSIS — O10919 Unspecified pre-existing hypertension complicating pregnancy, unspecified trimester: Secondary | ICD-10-CM

## 2020-08-22 DIAGNOSIS — O26899 Other specified pregnancy related conditions, unspecified trimester: Secondary | ICD-10-CM

## 2020-08-22 DIAGNOSIS — O10013 Pre-existing essential hypertension complicating pregnancy, third trimester: Secondary | ICD-10-CM

## 2020-08-22 DIAGNOSIS — R1013 Epigastric pain: Secondary | ICD-10-CM | POA: Insufficient documentation

## 2020-08-22 DIAGNOSIS — Z3A29 29 weeks gestation of pregnancy: Secondary | ICD-10-CM

## 2020-08-22 DIAGNOSIS — O26893 Other specified pregnancy related conditions, third trimester: Secondary | ICD-10-CM | POA: Diagnosis not present

## 2020-08-22 LAB — CBC
HCT: 30.6 % — ABNORMAL LOW (ref 36.0–46.0)
Hemoglobin: 10.4 g/dL — ABNORMAL LOW (ref 12.0–15.0)
MCH: 32.5 pg (ref 26.0–34.0)
MCHC: 34 g/dL (ref 30.0–36.0)
MCV: 95.6 fL (ref 80.0–100.0)
Platelets: 213 10*3/uL (ref 150–400)
RBC: 3.2 MIL/uL — ABNORMAL LOW (ref 3.87–5.11)
RDW: 12.9 % (ref 11.5–15.5)
WBC: 9.2 10*3/uL (ref 4.0–10.5)
nRBC: 0 % (ref 0.0–0.2)

## 2020-08-22 LAB — URINALYSIS, ROUTINE W REFLEX MICROSCOPIC
Glucose, UA: NEGATIVE mg/dL
Hgb urine dipstick: NEGATIVE
Ketones, ur: 20 mg/dL — AB
Nitrite: NEGATIVE
Protein, ur: 100 mg/dL — AB
Specific Gravity, Urine: 1.031 — ABNORMAL HIGH (ref 1.005–1.030)
pH: 5 (ref 5.0–8.0)

## 2020-08-22 LAB — COMPREHENSIVE METABOLIC PANEL
ALT: 13 U/L (ref 0–44)
AST: 19 U/L (ref 15–41)
Albumin: 2.6 g/dL — ABNORMAL LOW (ref 3.5–5.0)
Alkaline Phosphatase: 84 U/L (ref 38–126)
Anion gap: 9 (ref 5–15)
BUN: 8 mg/dL (ref 6–20)
CO2: 21 mmol/L — ABNORMAL LOW (ref 22–32)
Calcium: 8.8 mg/dL — ABNORMAL LOW (ref 8.9–10.3)
Chloride: 106 mmol/L (ref 98–111)
Creatinine, Ser: 0.77 mg/dL (ref 0.44–1.00)
GFR, Estimated: >60 mL/min (ref >60)
Glucose, Bld: 69 mg/dL — ABNORMAL LOW (ref 70–99)
Potassium: 3.4 mmol/L — ABNORMAL LOW (ref 3.5–5.1)
Sodium: 136 mmol/L (ref 135–145)
Total Bilirubin: 0.8 mg/dL (ref 0.3–1.2)
Total Protein: 5.2 g/dL — ABNORMAL LOW (ref 6.5–8.1)

## 2020-08-22 LAB — PROTEIN / CREATININE RATIO, URINE
Creatinine, Urine: 576.96 mg/dL
Protein Creatinine Ratio: 0.06 mg/mg{Cre} (ref 0.00–0.15)
Total Protein, Urine: 32 mg/dL

## 2020-08-22 MED ORDER — LIDOCAINE VISCOUS HCL 2 % MT SOLN
15.0000 mL | Freq: Once | OROMUCOSAL | Status: AC
  Administered 2020-08-22: 15 mL via ORAL
  Filled 2020-08-22: qty 15

## 2020-08-22 MED ORDER — DICYCLOMINE HCL 10 MG/5ML PO SOLN
10.0000 mg | Freq: Once | ORAL | Status: AC
  Administered 2020-08-22: 10 mg via ORAL
  Filled 2020-08-22: qty 5

## 2020-08-22 MED ORDER — ALUM & MAG HYDROXIDE-SIMETH 200-200-20 MG/5ML PO SUSP
30.0000 mL | Freq: Once | ORAL | Status: AC
  Administered 2020-08-22: 30 mL via ORAL
  Filled 2020-08-22: qty 30

## 2020-08-22 NOTE — MAU Note (Signed)
Pain in upper mid abd, started about 1100, sharp, cramping, comes and goes.  Has thrown up twice, loose bm x2.  Denies bleeding or leaking.

## 2020-08-22 NOTE — MAU Note (Signed)
Pain started today after lunch-turkey sandwich and pineapples.Kelly Lane

## 2020-08-22 NOTE — MAU Note (Signed)
Pt sleeping between questions-awoken to finish assessment.

## 2020-08-22 NOTE — MAU Note (Signed)
FFN collected prioe to SVEper CNM-adoptive mom present for all procedures-ok'd prior by pt

## 2020-08-22 NOTE — Discharge Instructions (Signed)

## 2020-08-22 NOTE — MAU Provider Note (Addendum)
History     CSN: [id number]  Arrival date and time: 08/22/20 1820   Event Date/Time   First Provider Initiated Contact with Patient 08/22/20 1936      Chief Complaint  Patient presents with  . Abdominal Pain   Ms. Kelly Lane is a 25 y.o. year old G71P0010 female at 103w4d weeks gestation who presents to MAU reporting pain in her epigastric area that started at 1100. She reports it as intermittent, sharp and cramping. She states the symptoms started after eating a [place] sandwich and pineapples for lunch today. She reports emesis x 1 and "not diarrhea, but not regular" stool x 2 after 1100 today also. She denies VB or LOF. She has a h/o GERD, but denies heartburn today. She receives [facility] next appt is tomorrow 08/23/2020.   OB History    Gravida  2   Para  0   Term  0   Preterm  0   AB  1   Living  0     SAB  0   IAB  1   Ectopic  0   Multiple  1   Live Births  0           Past Medical History:  Diagnosis Date  . Anxiety   . Endometriosis 07/2015  . GERD (gastroesophageal reflux disease)    NO MEDS  . Headache   . Ovarian cyst 07/2015    Past Surgical History:  Procedure Laterality Date  . LAPAROSCOPIC OVARIAN CYSTECTOMY Right 07/17/2015   Procedure: EXCISION OF ENDOMETRIOSIS;  Surgeon: Gae Dry, MD;  Location: [facility] ORS;  Service: Gynecology;  Laterality: Right;  . LAPAROSCOPY N/A 07/17/2015   Procedure: LAPAROSCOPY OPERATIVE;  Surgeon: Gae Dry, MD;  Location: [facility] ORS;  Service: Gynecology;  Laterality: N/A;  . NO PAST SURGERIES      Family History  Problem Relation Age of Onset  . Hypertension Mother     Social History   Tobacco Use  . Smoking status: Never Smoker  . Smokeless tobacco: Never Used  Vaping Use  . Vaping Use: Never used  Substance Use Topics  . Alcohol use: Not Currently    Comment: every six months maybe 1 drink  . Drug use: No    Allergies: No Known Allergies  Medications Prior to Admission   Medication Sig Dispense Refill Last Dose  . Prenatal Vit-Fe Phos-FA-Omega (VITAFOL GUMMIES) 3.33-0.333-34.8 MG CHEW Chew 3 tablets by mouth daily before breakfast. 90 tablet 11 08/21/2020 at Unknown time  . cetirizine (ZYRTEC) 10 MG tablet Take 1 tablet (10 mg total) by mouth daily. (Patient not taking: Reported on 08/09/2020) 30 tablet 4   . cyclobenzaprine (FLEXERIL) 5 MG tablet Take 1 tablet (5 mg total) by mouth at bedtime as needed for muscle spasms. (Patient not taking: No sig reported) 20 tablet 0   . ondansetron (ZOFRAN ODT) 4 MG disintegrating tablet Take 1 tablet (4 mg total) by mouth every 8 (eight) hours as needed for nausea or vomiting. (Patient not taking: Reported on 08/09/2020) 30 tablet 1     Review of Systems  Constitutional: Negative.   HENT: Negative.   Eyes: Negative.   Respiratory: Negative.   Cardiovascular: Negative.   Gastrointestinal: Positive for abdominal pain (upper-mid, intermittent, sharp) and vomiting (x1). Negative for diarrhea ("not diarrhea, but not regular").  Endocrine: Negative.   Genitourinary: Negative.   Musculoskeletal: Negative.   Skin: Negative.   Allergic/Immunologic: Negative.   Neurological: Negative.  Hematological: Negative.   Psychiatric/Behavioral: Negative.    Physical Exam   Patient Vitals for the past 24 hrs:  BP Temp Temp src Pulse Resp SpO2 Height  08/22/20 2209 -- -- -- -- -- 99 % --  08/22/20 2208 135/89 -- -- 75 17 -- --  08/22/20 2204 -- -- -- -- -- 99 % --  08/22/20 2200 (!) 150/95 -- -- 72 -- -- --  08/22/20 2159 -- -- -- -- -- 99 % --  08/22/20 2154 -- -- -- -- -- 100 % --  08/22/20 2118 (!) 145/94 -- -- 67 -- -- --  08/22/20 2114 -- -- -- -- -- 100 % --  08/22/20 2109 -- -- -- -- -- 100 % --  08/22/20 2100 (!) 149/98 -- -- 79 -- -- --  08/22/20 2049 -- -- -- -- -- 97 % --  08/22/20 2045 (!) 142/97 -- -- 73 19 -- --  08/22/20 2044 -- -- -- -- -- 100 % --  08/22/20 2039 -- -- -- -- -- 98 % --  08/22/20 2034 -- -- --  -- -- 100 % --  08/22/20 2030 (!) 141/92 -- -- 92 17 -- --  08/22/20 2029 -- -- -- -- -- 100 % --  08/22/20 2024 -- -- -- -- -- 100 % --  08/22/20 2022 (!) 136/99 -- -- 74 17 -- --  08/22/20 2019 -- -- -- -- -- 100 % --  08/22/20 2014 -- -- -- -- -- 99 % --  08/22/20 2009 -- -- -- -- -- 100 % --  08/22/20 2004 -- -- -- -- -- 98 % --  08/22/20 1959 -- -- -- -- -- 99 % --  08/22/20 1954 -- -- -- -- -- 99 % --  08/22/20 1949 -- -- -- -- -- 100 % --  08/22/20 1934 -- -- -- -- -- 99 % --  08/22/20 1930 128/83 -- -- 71 -- 99 % --  08/22/20 1924 -- -- -- -- -- 98 % --  08/22/20 1919 -- -- -- -- -- 98 % --  08/22/20 1914 -- -- -- -- -- 99 % --  08/22/20 1856 (!) 137/92 -- -- 76 -- -- --  08/22/20 1854 -- -- -- -- -- 99 % --  08/22/20 1851 138/89 98.2 F (36.8 C) Oral 75 16 99 % 5\' 3"  (1.6 m)     Physical Exam Vitals and nursing note reviewed.  Constitutional:      Appearance: Normal appearance. She is obese.  Abdominal:     General: Bowel sounds are normal.     Palpations: Abdomen is soft.     Tenderness: There is abdominal tenderness (with palpation) in the epigastric area. There is rebound.    Skin:    General: Skin is warm and dry.  Neurological:     Mental Status: She is alert and oriented to person, place, and time.  Psychiatric:        Mood and Affect: Mood normal.        Behavior: Behavior normal.        Thought Content: Thought content normal.        Judgment: Judgment normal.    FHTs by doppler: 152 bpm MAU Course  Procedures  MDM CCUA UCx -- Results pending  CBC CMP P/C Ratio Serial BP's   Results for orders placed or performed during the hospital encounter of 08/22/20 (from the past 24 hour(s))  Urinalysis, Routine w reflex microscopic Urine, Clean  Catch     Status: Abnormal   Collection Time: 08/22/20  6:47 PM  Result Value Ref Range   Color, Urine AMBER (A) YELLOW   APPearance CLOUDY (A) CLEAR   Specific Gravity, Urine 1.031 (H) 1.005 - 1.030   pH  5.0 5.0 - 8.0   Glucose, UA NEGATIVE NEGATIVE mg/dL   Hgb urine dipstick NEGATIVE NEGATIVE   Bilirubin Urine SMALL (A) NEGATIVE   Ketones, ur 20 (A) NEGATIVE mg/dL   Protein, ur 100 (A) NEGATIVE mg/dL   Nitrite NEGATIVE NEGATIVE   Leukocytes,Ua TRACE (A) NEGATIVE   RBC / HPF 0-5 0 - 5 RBC/hpf   WBC, UA 11-20 0 - 5 WBC/hpf   Bacteria, UA FEW (A) NONE SEEN   Squamous Epithelial / LPF 21-50 0 - 5   Mucus PRESENT   Protein / creatinine ratio, urine     Status: None   Collection Time: 08/22/20  7:33 PM  Result Value Ref Range   Creatinine, Urine 576.96 mg/dL   Total Protein, Urine 32 mg/dL   Protein Creatinine Ratio 0.06 0.00 - 0.15 mg/mg[Cre]  CBC     Status: Abnormal   Collection Time: 08/22/20  7:54 PM  Result Value Ref Range   WBC 9.2 4.0 - 10.5 K/uL   RBC 3.20 (L) 3.87 - 5.11 MIL/uL   Hemoglobin 10.4 (L) 12.0 - 15.0 g/dL   HCT 30.6 (L) 36.0 - 46.0 %   MCV 95.6 80.0 - 100.0 fL   MCH 32.5 26.0 - 34.0 pg   MCHC 34.0 30.0 - 36.0 g/dL   RDW 12.9 11.5 - 15.5 %   Platelets 213 150 - 400 K/uL   nRBC 0.0 0.0 - 0.2 %  Comprehensive metabolic panel     Status: Abnormal   Collection Time: 08/22/20  7:54 PM  Result Value Ref Range   Sodium 136 135 - 145 mmol/L   Potassium 3.4 (L) 3.5 - 5.1 mmol/L   Chloride 106 98 - 111 mmol/L   CO2 21 (L) 22 - 32 mmol/L   Glucose, Bld 69 (L) 70 - 99 mg/dL   BUN 8 6 - 20 mg/dL   Creatinine, Ser 0.77 0.44 - 1.00 mg/dL   Calcium 8.8 (L) 8.9 - 10.3 mg/dL   Total Protein 5.2 (L) 6.5 - 8.1 g/dL   Albumin 2.6 (L) 3.5 - 5.0 g/dL   AST 19 15 - 41 U/L   ALT 13 0 - 44 U/L   Alkaline Phosphatase 84 38 - 126 U/L   Total Bilirubin 0.8 0.3 - 1.2 mg/dL   GFR, Estimated >60 >60 mL/min   Anion gap 9 5 - 15     Report given to and care assumed by [facility], CNM @ [address], CNM 08/22/2020, 7:36 PM   Fetal Tracing:  Baseline: 140 Variability: moderate Accels: 10x10 Decels: variable  Toco: occasional uc's  Dilation: Closed Effacement  (%): Thick Cervical Position: Posterior Exam by:: Len Blalock CNM   Patient reports resolution of pain. Occasional uc's noted on monitoring. Cervix closed/thick/posterior. PO hydration given and contractions resolved.   Discussed likely chronic hypertension due to elevated BPs before 20 weeks. Baseline labs normal today. Discussed that this no longer makes her a candidate for waterbirth. Warning signs of preeclampsia reviewed at length.   Assessment and Plan   1. Abdominal pain affecting pregnancy   2. Chronic hypertension affecting pregnancy   3. [redacted] weeks gestation of pregnancy    -Discharge home in stable condition -Third  trimester precautions discussed -Patient advised to follow-up with OB as scheduled tomorrow for prenatal care -Patient may return to MAU as needed or if her condition were to change or worsen  Wende Mott, [place] 08/22/20 10:16 PM

## 2020-08-22 NOTE — MAU Note (Signed)
Pt states she has not had any further of epigastric pain episodes. With the last pain at 1830 on the way to the hospital. 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

## 2020-08-23 ENCOUNTER — Other Ambulatory Visit: Payer: Self-pay | Admitting: *Deleted

## 2020-08-23 ENCOUNTER — Encounter: Payer: BC Managed Care – PPO | Admitting: Obstetrics and Gynecology

## 2020-08-23 MED ORDER — BLOOD PRESSURE MONITOR KIT: 1.0000 | PACK | Freq: Once | 0 refills | Status: AC

## 2020-08-23 NOTE — Progress Notes (Signed)
Blood pressure cuff ordered today to [employer].  Call placed to pt to make aware.  No answer, VM full.

## 2020-08-24 ENCOUNTER — Other Ambulatory Visit: Payer: Self-pay

## 2020-08-24 ENCOUNTER — Encounter (HOSPITAL_COMMUNITY): Payer: Self-pay | Admitting: Obstetrics and Gynecology

## 2020-08-24 ENCOUNTER — Inpatient Hospital Stay (HOSPITAL_COMMUNITY)
Admission: AD | Admit: 2020-08-24 | Discharge: 2020-08-25 | Disposition: A | Discharge status: Home / Self Care | Payer: BC Managed Care – PPO | Attending: Obstetrics and Gynecology | Admitting: Obstetrics and Gynecology

## 2020-08-24 DIAGNOSIS — O99891 Other specified diseases and conditions complicating pregnancy: Secondary | ICD-10-CM | POA: Diagnosis not present

## 2020-08-24 DIAGNOSIS — O10013 Pre-existing essential hypertension complicating pregnancy, third trimester: Secondary | ICD-10-CM | POA: Diagnosis not present

## 2020-08-24 DIAGNOSIS — O26893 Other specified pregnancy related conditions, third trimester: Secondary | ICD-10-CM | POA: Diagnosis not present

## 2020-08-24 DIAGNOSIS — Z3400 Encounter for supervision of normal first pregnancy, unspecified trimester: Secondary | ICD-10-CM

## 2020-08-24 DIAGNOSIS — O10919 Unspecified pre-existing hypertension complicating pregnancy, unspecified trimester: Secondary | ICD-10-CM

## 2020-08-24 DIAGNOSIS — Z3A30 30 weeks gestation of pregnancy: Secondary | ICD-10-CM

## 2020-08-24 DIAGNOSIS — R102 Pelvic and perineal pain: Secondary | ICD-10-CM | POA: Diagnosis present

## 2020-08-24 DIAGNOSIS — R519 Headache, unspecified: Secondary | ICD-10-CM | POA: Diagnosis not present

## 2020-08-24 LAB — CULTURE, OB URINE

## 2020-08-24 NOTE — MAU Note (Signed)
Pt reports she was here on Wed for PIH eval. Stated she went home and some pelvic pain and pressure. That has resolved. She has noticed today that her feet are more swollen and she has a mild headache. Good fetal movement felt. Took b/p yesterday and it was 152/101. Did not have b/p cuff with her today.

## 2020-08-25 DIAGNOSIS — R102 Pelvic and perineal pain: Secondary | ICD-10-CM | POA: Diagnosis not present

## 2020-08-25 DIAGNOSIS — O26893 Other specified pregnancy related conditions, third trimester: Secondary | ICD-10-CM | POA: Diagnosis not present

## 2020-08-25 DIAGNOSIS — O10013 Pre-existing essential hypertension complicating pregnancy, third trimester: Secondary | ICD-10-CM | POA: Diagnosis not present

## 2020-08-25 DIAGNOSIS — O99891 Other specified diseases and conditions complicating pregnancy: Secondary | ICD-10-CM

## 2020-08-25 DIAGNOSIS — Z3A30 30 weeks gestation of pregnancy: Secondary | ICD-10-CM

## 2020-08-25 DIAGNOSIS — R519 Headache, unspecified: Secondary | ICD-10-CM

## 2020-08-25 LAB — CBC
HCT: 30.1 % — ABNORMAL LOW (ref 36.0–46.0)
Hemoglobin: 10.5 g/dL — ABNORMAL LOW (ref 12.0–15.0)
MCH: 33 pg (ref 26.0–34.0)
MCHC: 34.9 g/dL (ref 30.0–36.0)
MCV: 94.7 fL (ref 80.0–100.0)
Platelets: 210 10*3/uL (ref 150–400)
RBC: 3.18 MIL/uL — ABNORMAL LOW (ref 3.87–5.11)
RDW: 13 % (ref 11.5–15.5)
WBC: 8.6 10*3/uL (ref 4.0–10.5)
nRBC: 0 % (ref 0.0–0.2)

## 2020-08-25 LAB — URINALYSIS, ROUTINE W REFLEX MICROSCOPIC
Bilirubin Urine: NEGATIVE
Glucose, UA: NEGATIVE mg/dL
Hgb urine dipstick: NEGATIVE
Ketones, ur: NEGATIVE mg/dL
Leukocytes,Ua: NEGATIVE
Nitrite: NEGATIVE
Protein, ur: NEGATIVE mg/dL
Specific Gravity, Urine: 1.011 (ref 1.005–1.030)
pH: 6 (ref 5.0–8.0)

## 2020-08-25 LAB — COMPREHENSIVE METABOLIC PANEL
ALT: 14 U/L (ref 0–44)
AST: 19 U/L (ref 15–41)
Albumin: 2.4 g/dL — ABNORMAL LOW (ref 3.5–5.0)
Alkaline Phosphatase: 87 U/L (ref 38–126)
Anion gap: 5 (ref 5–15)
BUN: 6 mg/dL (ref 6–20)
CO2: 23 mmol/L (ref 22–32)
Calcium: 8.7 mg/dL — ABNORMAL LOW (ref 8.9–10.3)
Chloride: 109 mmol/L (ref 98–111)
Creatinine, Ser: 0.78 mg/dL (ref 0.44–1.00)
GFR, Estimated: >60 mL/min (ref >60)
Glucose, Bld: 94 mg/dL (ref 70–99)
Potassium: 3.2 mmol/L — ABNORMAL LOW (ref 3.5–5.1)
Sodium: 137 mmol/L (ref 135–145)
Total Bilirubin: 0.5 mg/dL (ref 0.3–1.2)
Total Protein: 5.5 g/dL — ABNORMAL LOW (ref 6.5–8.1)

## 2020-08-25 LAB — PROTEIN / CREATININE RATIO, URINE
Creatinine, Urine: 138.08 mg/dL
Protein Creatinine Ratio: 0.16 mg/mg{Cre} — ABNORMAL HIGH (ref 0.00–0.15)
Total Protein, Urine: 22 mg/dL

## 2020-08-25 MED ORDER — ACETAMINOPHEN 500 MG PO TABS
1000.0000 mg | ORAL_TABLET | Freq: Once | ORAL | Status: AC
  Administered 2020-08-25: 1000 mg via ORAL
  Filled 2020-08-25: qty 2

## 2020-08-25 NOTE — MAU Provider Note (Signed)
Chief Complaint:  Leg Swelling and Headache   Event Date/Time   First Provider Initiated Contact with Patient 08/25/20 0015      HPI: Kelly Lane is a 25 y.o. G2P0010 at [redacted]w[redacted]d by early ultrasound who presents to maternity admissions reporting pelvic pain and pressure, swelling in her feet and mild headache.  She took her BP at home yesterday and it was 152/101.  She has not tried anything for per pelvic pain or h/a. There are no other associated symptoms.     Location: lower abdomen, pelvis, h/a Quality: pressure Severity: 5/10 on pain scale Duration: h/a x 2 weeks, pelvic pain x 2-3 days Timing: intermittent Modifying factors: pelvic pain worse with walking, pt has not tried anything for h/a Associated signs and symptoms: none  HPI  Past Medical History:     Past Medical History:  Diagnosis Date  . Anxiety   . Endometriosis 07/2015  . GERD (gastroesophageal reflux disease)    NO MEDS  . Headache   . Ovarian cyst 07/2015    Past obstetric history:                 OB History  Gravida Para Term Preterm AB Living  2 0 0 0 1 0  SAB IAB Ectopic Multiple Live Births     0 1 0 1 0       # Outcome Date GA Lbr Len/2nd Weight Sex Delivery Anes PTL Lv  2A Gravida           2B Current           1 IAB 01/19/18 [redacted]w[redacted]d           Past Surgical History:      Past Surgical History:  Procedure Laterality Date  . LAPAROSCOPIC OVARIAN CYSTECTOMY Right 07/17/2015   Procedure: EXCISION OF ENDOMETRIOSIS;  Surgeon: Gae Dry, MD;  Location: [facility] ORS;  Service: Gynecology;  Laterality: Right;  . LAPAROSCOPY N/A 07/17/2015   Procedure: LAPAROSCOPY OPERATIVE;  Surgeon: Gae Dry, MD;  Location: [facility] ORS;  Service: Gynecology;  Laterality: N/A;  . NO PAST SURGERIES      Family History:      Family History  Problem Relation Age of Onset  . Hypertension Mother     Social History: Social History        Tobacco Use  .  Smoking status: Never Smoker  . Smokeless tobacco: Never Used  Vaping Use  . Vaping Use: Never used  Substance Use Topics  . Alcohol use: Not Currently    Comment: every six months maybe 1 drink  . Drug use: No    Allergies: No Known Allergies  Meds:         Medications Prior to Admission  Medication Sig Dispense Refill Last Dose  . cetirizine (ZYRTEC) 10 MG tablet Take 1 tablet (10 mg total) by mouth daily. (Patient not taking: Reported on 08/09/2020) 30 tablet 4   . cyclobenzaprine (FLEXERIL) 5 MG tablet Take 1 tablet (5 mg total) by mouth at bedtime as needed for muscle spasms. (Patient not taking: No sig reported) 20 tablet 0   . ondansetron (ZOFRAN ODT) 4 MG disintegrating tablet Take 1 tablet (4 mg total) by mouth every 8 (eight) hours as needed for nausea or vomiting. (Patient not taking: Reported on 08/09/2020) 30 tablet 1   . Prenatal Vit-Fe Phos-FA-Omega (VITAFOL GUMMIES) 3.33-0.333-34.8 MG CHEW Chew 3 tablets by mouth daily before breakfast. 90 tablet 11 08/22/2020  ROS:  Review of Systems  Constitutional: Negative for chills, fatigue and fever.  Eyes: Negative for visual disturbance.  Respiratory: Negative for shortness of breath.   Cardiovascular: Positive for leg swelling. Negative for chest pain.  Gastrointestinal: Negative for abdominal pain, nausea and vomiting.  Genitourinary: Positive for pelvic pain. Negative for difficulty urinating, dysuria, flank pain, vaginal bleeding, vaginal discharge and vaginal pain.  Neurological: Positive for headaches. Negative for dizziness.  Psychiatric/Behavioral: Negative.      I have reviewed patient's Past Medical Hx, Surgical Hx, Family Hx, Social Hx, medications and allergies.   Physical Exam    Patient Vitals for the past 24 hrs:  BP Temp Pulse Resp SpO2 Height Weight  08/25/20 0336 -- -- -- 16 99 % -- --  08/25/20 0316 129/85 -- (!) 55 -- -- -- --  08/25/20 0301 131/90 -- (!) 57 -- -- -- --  08/25/20  0246 123/81 -- (!) 54 -- -- -- --  08/25/20 0240 -- -- -- -- 99 % -- --  08/25/20 0235 -- -- -- -- 99 % -- --  08/25/20 0231 132/86 -- 61 -- 100 % -- --  08/25/20 0225 -- -- -- -- 100 % -- --  08/25/20 0220 -- -- -- -- 99 % -- --  08/25/20 0216 (!) 156/95 -- (!) 55 -- -- -- --  08/25/20 0215 -- -- -- -- 99 % -- --  08/25/20 0210 -- -- -- -- 99 % -- --  08/25/20 0205 -- -- -- -- 100 % -- --  08/25/20 0201 (!) 140/97 -- (!) 55 -- -- -- --  08/25/20 0200 -- -- -- -- 100 % -- --  08/25/20 0155 -- -- -- -- 100 % -- --  08/25/20 0150 -- -- -- -- 100 % -- --  08/25/20 0146 (!) 145/98 -- (!) 55 -- -- -- --  08/25/20 0145 -- -- -- -- 100 % -- --  08/25/20 0140 -- -- -- -- 99 % -- --  08/25/20 0135 -- -- -- -- 99 % -- --  08/25/20 0131 134/90 -- (!) 57 -- -- -- --  08/25/20 0130 -- -- -- -- 99 % -- --  08/25/20 0125 -- -- -- -- 99 % -- --  08/25/20 0120 -- -- -- -- 100 % -- --  08/25/20 0116 (!) 147/90 -- (!) 58 -- -- -- --  08/25/20 0115 -- -- -- -- 98 % -- --  08/25/20 0110 -- -- -- -- 100 % -- --  08/25/20 0105 -- -- -- -- 100 % -- --  08/25/20 0101 (!) 151/100 -- 66 -- -- -- --  08/25/20 0100 -- -- -- -- 100 % -- --  08/25/20 0055 -- -- -- -- 100 % -- --  08/25/20 0050 -- -- -- -- 100 % -- --  08/25/20 0046 (!) 136/100 -- 64 -- -- -- --  08/25/20 0045 -- -- -- -- 100 % -- --  08/25/20 0040 -- -- -- -- 100 % -- --  08/25/20 0035 -- -- -- -- 100 % -- --  08/25/20 0031 (!) 135/94 -- 61 -- -- -- --  08/25/20 0030 -- -- -- -- 100 % -- --  08/25/20 0025 -- -- -- -- 100 % -- --  08/25/20 0020 -- -- -- -- 100 % -- --  08/25/20 0016 127/87 -- 61 -- -- -- --  08/25/20 0015 -- -- -- -- 100 % -- --  08/25/20 0010 -- -- -- --  100 % -- --  08/25/20 0006 -- -- -- -- 100 % -- --  08/25/20 0001 132/87 -- 66 -- -- -- --  08/25/20 0000 -- -- -- -- 100 % -- --  08/24/20 2355 -- -- -- -- 100 % -- --  08/24/20 2354 (!) 136/91 -- 66 -- -- -- --  08/24/20 2338 (!) 144/95 97.9 F (36.6 C) 64 18 -- 5'  3" (1.6 m) 76.7 kg    Constitutional: Well-developed, well-nourished female in no acute distress.  Cardiovascular: normal rate Respiratory: normal effort GI: Abd soft, non-tender, gravid appropriate for gestational age.  MS: Extremities nontender, no edema, normal ROM Neurological - alert, oriented, normal speech, no focal findings or movement disorder noted, screening mental status exam normal, cranial nerves II through XII intact, DTR's normal and symmetric, motor and sensory grossly normal bilaterally, normal muscle tone, no tremors, strength 5/5  GU: Neg CVAT.  Dilation: Closed Effacement (%): Thick Cervical Position: Posterior Exam by:: Left[place]-Kir[place],CNM  FHT:  Baseline 135 , moderate variability, accelerations present, no decelerations Contractions: None on toco or to palpation   Labs: Lab Results Last 24 Hours       Results for orders placed or performed during the hospital encounter of 08/24/20 (from the past 24 hour(s))  Protein / creatinine ratio, urine     Status: Abnormal   Collection Time: 08/24/20 11:53 PM  Result Value Ref Range   Creatinine, Urine 138.08 mg/dL   Total Protein, Urine 22 mg/dL   Protein Creatinine Ratio 0.16 (H) 0.00 - 0.15 mg/mg[Cre]  Urinalysis, Routine w reflex microscopic     Status: Abnormal   Collection Time: 08/24/20 11:59 PM  Result Value Ref Range   Color, Urine YELLOW YELLOW   APPearance HAZY (A) CLEAR   Specific Gravity, Urine 1.011 1.005 - 1.030   pH 6.0 5.0 - 8.0   Glucose, UA NEGATIVE NEGATIVE mg/dL   Hgb urine dipstick NEGATIVE NEGATIVE   Bilirubin Urine NEGATIVE NEGATIVE   Ketones, ur NEGATIVE NEGATIVE mg/dL   Protein, ur NEGATIVE NEGATIVE mg/dL   Nitrite NEGATIVE NEGATIVE   Leukocytes,Ua NEGATIVE NEGATIVE  CBC     Status: Abnormal   Collection Time: 08/25/20 12:20 AM  Result Value Ref Range   WBC 8.6 4.0 - 10.5 K/uL   RBC 3.18 (L) 3.87 - 5.11 MIL/uL   Hemoglobin 10.5 (L) 12.0 - 15.0 g/dL   HCT  30.1 (L) 36.0 - 46.0 %   MCV 94.7 80.0 - 100.0 fL   MCH 33.0 26.0 - 34.0 pg   MCHC 34.9 30.0 - 36.0 g/dL   RDW 13.0 11.5 - 15.5 %   Platelets 210 150 - 400 K/uL   nRBC 0.0 0.0 - 0.2 %  Comprehensive metabolic panel     Status: Abnormal   Collection Time: 08/25/20 12:20 AM  Result Value Ref Range   Sodium 137 135 - 145 mmol/L   Potassium 3.2 (L) 3.5 - 5.1 mmol/L   Chloride 109 98 - 111 mmol/L   CO2 23 22 - 32 mmol/L   Glucose, Bld 94 70 - 99 mg/dL   BUN 6 6 - 20 mg/dL   Creatinine, Ser 0.78 0.44 - 1.00 mg/dL   Calcium 8.7 (L) 8.9 - 10.3 mg/dL   Total Protein 5.5 (L) 6.5 - 8.1 g/dL   Albumin 2.4 (L) 3.5 - 5.0 g/dL   AST 19 15 - 41 U/L   ALT 14 0 - 44 U/L   Alkaline Phosphatase 87 38 - 126  U/L   Total Bilirubin 0.5 0.3 - 1.2 mg/dL   GFR, Estimated >60 >60 mL/min   Anion gap 5 5 - 15     AB/Negative/-- (01/20 1612)  Imaging:  Imaging Results  No results found.    MAU Course/MDM:    Orders Placed This Encounter  Procedures  . CBC  . Comprehensive metabolic panel  . Protein / creatinine ratio, urine  . Urinalysis, Routine w reflex microscopic    No orders of the defined types were placed in this encounter.    NST reviewed and appropriate for gestational age Cervix 0/thick/high, so no evidence of preterm labor For pelvic pain, recommend rest/ice/heat/warm bath/Tylenol/pregnancy support belt PEC labs wnl, P/C ratio 0.16 Pt with headache but initially declined medication, heating pad and PO fluids given. H/a not resolved so Tylenol 1000 mg given and headache reduced to 2/10 and pt able to sleep D/C home with close outpatient f/u for BP Rest/ice/heat/warm bath/Tylenol/pregnancy support belt for abdominal pain/pressure F[facility]U at [facility] on 08/27/20 as scheduled Pt discharge with strict return precautions.    Assessment: 1. Chronic hypertension affecting pregnancy   2. Supervision of normal first pregnancy, antepartum   3. Pregnancy headache  in third trimester   4. Pelvic pain affecting pregnancy in third trimester, antepartum   5. [redacted] weeks gestation of pregnancy     Plan: Discharge home Labor precautions and fetal kick counts   Allergies as of 08/25/2020   No Known Allergies Allergies as of 08/25/2020   No Known Allergies     Medication List    TAKE these medications   cetirizine 10 MG tablet Commonly known as: ZYRTEC Take 1 tablet (10 mg total) by mouth daily.   cyclobenzaprine 5 MG tablet Commonly known as: FLEXERIL Take 1 tablet (5 mg total) by mouth at bedtime as needed for muscle spasms.   ondansetron 4 MG disintegrating tablet Commonly known as: Zofran ODT Take 1 tablet (4 mg total) by mouth every 8 (eight) hours as needed for nausea or vomiting.   Vitafol Gummies 3.33-0.333-34.8 MG Chew Chew 3 tablets by mouth daily before breakfast.       Fatima Blank, CNM 3:46 AM

## 2020-08-25 NOTE — MAU Provider Note (Incomplete)
Chief Complaint:  Leg Swelling and Headache   Event Date/Time   First Provider Initiated Contact with Patient 08/25/20 0015      HPI: Kelly Lane is a 25 y.o. G2P0010 at [redacted]w[redacted]d by early ultrasound who presents to maternity admissions reporting pelvic pain and pressure, swelling in her feet and mild headache.  She took her BP at home yesterday and it was 152/101.  She has not tried anything for per pelvic pain or h/a. There are no other associated symptoms.     Location: lower abdomen, pelvis, h/a Quality: pressure Severity: 5/10 on pain scale Duration: h/a x 2 weeks, pelvic pain x 2-3 days Timing: intermittent Modifying factors: pelvic pain worse with walking, pt has not tried anything for h/a Associated signs and symptoms: none  HPI  Past Medical History: Past Medical History:  Diagnosis Date  . Anxiety   . Endometriosis 07/2015  . GERD (gastroesophageal reflux disease)    NO MEDS  . Headache   . Ovarian cyst 07/2015    Past obstetric history: OB History  Gravida Para Term Preterm AB Living  2 0 0 0 1 0  SAB IAB Ectopic Multiple Live Births  0 1 0 1 0    # Outcome Date GA Lbr Len/2nd Weight Sex Delivery Anes PTL Lv  2A Gravida           2B Current           1 IAB 01/19/18 [redacted]w[redacted]d           Past Surgical History: Past Surgical History:  Procedure Laterality Date  . LAPAROSCOPIC OVARIAN CYSTECTOMY Right 07/17/2015   Procedure: EXCISION OF ENDOMETRIOSIS;  Surgeon: Gae Dry, MD;  Location: [facility] ORS;  Service: Gynecology;  Laterality: Right;  . LAPAROSCOPY N/A 07/17/2015   Procedure: LAPAROSCOPY OPERATIVE;  Surgeon: Gae Dry, MD;  Location: [facility] ORS;  Service: Gynecology;  Laterality: N/A;  . NO PAST SURGERIES      Family History: Family History  Problem Relation Age of Onset  . Hypertension Mother     Social History: Social History   Tobacco Use  . Smoking status: Never Smoker  . Smokeless tobacco: Never Used  Vaping Use  . Vaping Use: Never  used  Substance Use Topics  . Alcohol use: Not Currently    Comment: every six months maybe 1 drink  . Drug use: No    Allergies: No Known Allergies  Meds:  Medications Prior to Admission  Medication Sig Dispense Refill Last Dose  . cetirizine (ZYRTEC) 10 MG tablet Take 1 tablet (10 mg total) by mouth daily. (Patient not taking: Reported on 08/09/2020) 30 tablet 4   . cyclobenzaprine (FLEXERIL) 5 MG tablet Take 1 tablet (5 mg total) by mouth at bedtime as needed for muscle spasms. (Patient not taking: No sig reported) 20 tablet 0   . ondansetron (ZOFRAN ODT) 4 MG disintegrating tablet Take 1 tablet (4 mg total) by mouth every 8 (eight) hours as needed for nausea or vomiting. (Patient not taking: Reported on 08/09/2020) 30 tablet 1   . Prenatal Vit-Fe Phos-FA-Omega (VITAFOL GUMMIES) 3.33-0.333-34.8 MG CHEW Chew 3 tablets by mouth daily before breakfast. 90 tablet 11 08/22/2020    ROS:  Review of Systems  Constitutional: Negative for chills, fatigue and fever.  Eyes: Negative for visual disturbance.  Respiratory: Negative for shortness of breath.   Cardiovascular: Positive for leg swelling. Negative for chest pain.  Gastrointestinal: Negative for abdominal pain, nausea and vomiting.  Genitourinary:  Positive for pelvic pain. Negative for difficulty urinating, dysuria, flank pain, vaginal bleeding, vaginal discharge and vaginal pain.  Neurological: Positive for headaches. Negative for dizziness.  Psychiatric/Behavioral: Negative.      I have reviewed patient's Past Medical Hx, Surgical Hx, Family Hx, Social Hx, medications and allergies.   Physical Exam   Patient Vitals for the past 24 hrs:  BP Temp Pulse Resp SpO2 Height Weight  08/25/20 0101 (!) 151/100 - 66 - - - -  08/25/20 0100 - - - - 100 % - -  08/25/20 0055 - - - - 100 % - -  08/25/20 0050 - - - - 100 % - -  08/25/20 0046 (!) 136/100 - 64 - - - -  08/25/20 0045 - - - - 100 % - -  08/25/20 0040 - - - - 100 % - -  08/25/20  0035 - - - - 100 % - -  08/25/20 0031 (!) 135/94 - 61 - - - -  08/25/20 0030 - - - - 100 % - -  08/25/20 0025 - - - - 100 % - -  08/25/20 0020 - - - - 100 % - -  08/25/20 0016 127/87 - 61 - - - -  08/25/20 0015 - - - - 100 % - -  08/25/20 0010 - - - - 100 % - -  08/25/20 0006 - - - - 100 % - -  08/25/20 0001 132/87 - 66 - - - -  08/25/20 0000 - - - - 100 % - -  08/24/20 2355 - - - - 100 % - -  08/24/20 2354 (!) 136/91 - 66 - - - -  08/24/20 2338 (!) 144/95 97.9 F (36.6 C) 64 18 - 5\' 3"  (1.6 m) 76.7 kg   Constitutional: Well-developed, well-nourished female in no acute distress.  Cardiovascular: normal rate Respiratory: normal effort GI: Abd soft, non-tender, gravid appropriate for gestational age.  MS: Extremities nontender, no edema, normal ROM Neurologic: Alert and oriented x 4.  GU: Neg CVAT.  Dilation: Closed Effacement (%): Thick Cervical Position: Posterior Exam by:: Leftwich-Kirby,CNM  FHT:  Baseline 135 , moderate variability, accelerations present, no decelerations Contractions: None on toco or to palpation   Labs: Results for orders placed or performed during the hospital encounter of 08/24/20 (from the past 24 hour(s))  Protein / creatinine ratio, urine     Status: Abnormal   Collection Time: 08/24/20 11:53 PM  Result Value Ref Range   Creatinine, Urine 138.08 mg/dL   Total Protein, Urine 22 mg/dL   Protein Creatinine Ratio 0.16 (H) 0.00 - 0.15 mg/mg[Cre]  Urinalysis, Routine w reflex microscopic     Status: Abnormal   Collection Time: 08/24/20 11:59 PM  Result Value Ref Range   Color, Urine YELLOW YELLOW   APPearance HAZY (A) CLEAR   Specific Gravity, Urine 1.011 1.005 - 1.030   pH 6.0 5.0 - 8.0   Glucose, UA NEGATIVE NEGATIVE mg/dL   Hgb urine dipstick NEGATIVE NEGATIVE   Bilirubin Urine NEGATIVE NEGATIVE   Ketones, ur NEGATIVE NEGATIVE mg/dL   Protein, ur NEGATIVE NEGATIVE mg/dL   Nitrite NEGATIVE NEGATIVE   Leukocytes,Ua NEGATIVE NEGATIVE  CBC      Status: Abnormal   Collection Time: 08/25/20 12:20 AM  Result Value Ref Range   WBC 8.6 4.0 - 10.5 K/uL   RBC 3.18 (L) 3.87 - 5.11 MIL/uL   Hemoglobin 10.5 (L) 12.0 - 15.0 g/dL   HCT 30.1 (L) 36.0 -  46.0 %   MCV 94.7 80.0 - 100.0 fL   MCH 33.0 26.0 - 34.0 pg   MCHC 34.9 30.0 - 36.0 g/dL   RDW 13.0 11.5 - 15.5 %   Platelets 210 150 - 400 K/uL   nRBC 0.0 0.0 - 0.2 %  Comprehensive metabolic panel     Status: Abnormal   Collection Time: 08/25/20 12:20 AM  Result Value Ref Range   Sodium 137 135 - 145 mmol/L   Potassium 3.2 (L) 3.5 - 5.1 mmol/L   Chloride 109 98 - 111 mmol/L   CO2 23 22 - 32 mmol/L   Glucose, Bld 94 70 - 99 mg/dL   BUN 6 6 - 20 mg/dL   Creatinine, Ser 0.78 0.44 - 1.00 mg/dL   Calcium 8.7 (L) 8.9 - 10.3 mg/dL   Total Protein 5.5 (L) 6.5 - 8.1 g/dL   Albumin 2.4 (L) 3.5 - 5.0 g/dL   AST 19 15 - 41 U/L   ALT 14 0 - 44 U/L   Alkaline Phosphatase 87 38 - 126 U/L   Total Bilirubin 0.5 0.3 - 1.2 mg/dL   GFR, Estimated >60 >60 mL/min   Anion gap 5 5 - 15   AB/Negative/-- (01/20 1612)  Imaging:  No results found.  MAU Course/MDM: Orders Placed This Encounter  Procedures  . CBC  . Comprehensive metabolic panel  . Protein / creatinine ratio, urine  . Urinalysis, Routine w reflex microscopic    No orders of the defined types were placed in this encounter.    NST reviewed and appropriate for gestational age Cervix 0/thick/high, so no evidence of preterm labor For pelvic pain, recommend rest/ice/heat/warm bath/Tylenol/pregnancy support belt   Consult *** with presentation, exam findings and test results.  Treatments in MAU included ***.   Pt discharge with strict *** precautions.    Assessment: 1. Supervision of normal first pregnancy, antepartum     Plan: Discharge home Labor precautions and fetal kick counts  Allergies as of 08/25/2020   No Known Allergies   Med Rec must be completed prior to using this [facility]***       Fatima Blank Certified Nurse-Midwife 08/25/2020 1:56 AM

## 2020-08-27 ENCOUNTER — Ambulatory Visit (INDEPENDENT_AMBULATORY_CARE_PROVIDER_SITE_OTHER): Payer: BC Managed Care – PPO | Admitting: Obstetrics

## 2020-08-27 ENCOUNTER — Other Ambulatory Visit: Payer: Self-pay

## 2020-08-27 ENCOUNTER — Encounter: Payer: Self-pay | Admitting: Obstetrics

## 2020-08-27 VITALS — BP 153/100 | HR 85 | Wt 167.0 lb

## 2020-08-27 DIAGNOSIS — O0990 Supervision of high risk pregnancy, unspecified, unspecified trimester: Secondary | ICD-10-CM

## 2020-08-27 DIAGNOSIS — O99340 Other mental disorders complicating pregnancy, unspecified trimester: Secondary | ICD-10-CM

## 2020-08-27 DIAGNOSIS — Z3490 Encounter for supervision of normal pregnancy, unspecified, unspecified trimester: Secondary | ICD-10-CM

## 2020-08-27 DIAGNOSIS — F32A Depression, unspecified: Secondary | ICD-10-CM

## 2020-08-27 DIAGNOSIS — O139 Gestational [pregnancy-induced] hypertension without significant proteinuria, unspecified trimester: Secondary | ICD-10-CM

## 2020-08-27 MED ORDER — NIFEDIPINE ER OSMOTIC RELEASE 30 MG PO TB24
30.0000 mg | ORAL_TABLET | Freq: Two times a day (BID) | ORAL | 3 refills | Status: DC
Start: 2020-08-27 — End: 2020-09-01

## 2020-08-27 NOTE — Progress Notes (Signed)
Subjective:  Kelly Lane is a 25 y.o. G2P0010 at [redacted]w[redacted]d being seen today for ongoing prenatal care.  She is currently monitored for the following issues for this high-risk pregnancy and has Endometriosis; Supervision of normal pregnancy, antepartum; Rh negative state in antepartum period; Back pain in pregnancy; and Chronic hypertension affecting pregnancy on their problem list.  Patient reports swelling of face and feet.  Denies HA or visual changes..  Contractions: Irritability. Vag. Bleeding: None.  Movement: Present. Denies leaking of fluid.   The following portions of the patient's history were reviewed and updated as appropriate: allergies, current medications, past family history, past medical history, past social history, past surgical history and problem list. Problem list updated.  Objective:   Vitals:   08/27/20 1625 08/27/20 1631  BP: (!) 156/102 (!) 153/100  Pulse: 85   Weight: 167 lb (75.8 kg)     Fetal Status:     Movement: Present     General:  Alert, oriented and cooperative. Patient is in no acute distress.  Skin: Skin is warm and dry. No rash noted.   Cardiovascular: Normal heart rate noted  Respiratory: Normal respiratory effort, no problems with respiration noted  Abdomen: Soft, gravid, appropriate for gestational age. Pain/Pressure: Present     Pelvic:  Cervical exam deferred        Extremities: Normal range of motion.  Edema: Trace  Mental Status: Normal mood and affect. Normal behavior. Normal judgment and thought content.   Urinalysis:      Assessment and Plan:  Pregnancy: G2P0010 at [redacted]w[redacted]d  1. Supervision of high risk pregnancy, antepartum  2. PIH (pregnancy induced hypertension), antepartum Rx: - NIFEdipine (PROCARDIA XL) 30 MG 24 hr tablet; Take 1 tablet (30 mg total) by mouth in the morning and at bedtime.  Dispense: 60 tablet; Refill: 3  3. Depression affecting pregnancy, antepartum  4. Pregnancy with adoption planned, antepartum   Preterm  labor symptoms and general obstetric precautions including but not limited to vaginal bleeding, contractions, leaking of fluid and fetal movement were reviewed in detail with the patient. Please refer to After Visit Summary for other counseling recommendations.   Return in about 3 days (around 08/30/2020) for [facility].  Recheck BP.   Shelly Bombard, MD  08/27/20                                                                                                                      Pt would like to know why she has been dx with Chronic Hypertension.  Pt was recently seen in MAU and given dx.

## 2020-08-29 ENCOUNTER — Other Ambulatory Visit: Payer: Self-pay

## 2020-08-29 ENCOUNTER — Inpatient Hospital Stay (HOSPITAL_COMMUNITY)
Admission: AD | Admit: 2020-08-29 | Discharge: 2020-09-01 | DRG: 788 | Discharge status: Against Medical Advice | Payer: BC Managed Care – PPO | Attending: Obstetrics and Gynecology | Admitting: Obstetrics and Gynecology

## 2020-08-29 ENCOUNTER — Encounter (HOSPITAL_COMMUNITY): Payer: Self-pay | Admitting: Obstetrics & Gynecology

## 2020-08-29 DIAGNOSIS — Z3A32 32 weeks gestation of pregnancy: Secondary | ICD-10-CM

## 2020-08-29 DIAGNOSIS — Z20822 Contact with and (suspected) exposure to covid-19: Secondary | ICD-10-CM | POA: Diagnosis present

## 2020-08-29 DIAGNOSIS — O1002 Pre-existing essential hypertension complicating childbirth: Secondary | ICD-10-CM | POA: Diagnosis present

## 2020-08-29 DIAGNOSIS — O1413 Severe pre-eclampsia, third trimester: Principal | ICD-10-CM | POA: Diagnosis present

## 2020-08-29 DIAGNOSIS — O1414 Severe pre-eclampsia complicating childbirth: Secondary | ICD-10-CM | POA: Diagnosis present

## 2020-08-29 DIAGNOSIS — Z3490 Encounter for supervision of normal pregnancy, unspecified, unspecified trimester: Secondary | ICD-10-CM

## 2020-08-29 DIAGNOSIS — U071 COVID-19: Secondary | ICD-10-CM | POA: Diagnosis present

## 2020-08-29 DIAGNOSIS — O113 Pre-existing hypertension with pre-eclampsia, third trimester: Secondary | ICD-10-CM | POA: Diagnosis present

## 2020-08-29 DIAGNOSIS — Z3A30 30 weeks gestation of pregnancy: Secondary | ICD-10-CM

## 2020-08-29 DIAGNOSIS — Z6791 Unspecified blood type, Rh negative: Secondary | ICD-10-CM

## 2020-08-29 DIAGNOSIS — O114 Pre-existing hypertension with pre-eclampsia, complicating childbirth: Principal | ICD-10-CM | POA: Diagnosis present

## 2020-08-29 DIAGNOSIS — Z8616 Personal history of COVID-19: Secondary | ICD-10-CM

## 2020-08-29 DIAGNOSIS — O1410 Severe pre-eclampsia, unspecified trimester: Secondary | ICD-10-CM

## 2020-08-29 DIAGNOSIS — O26893 Other specified pregnancy related conditions, third trimester: Secondary | ICD-10-CM | POA: Diagnosis present

## 2020-08-29 DIAGNOSIS — Z3A31 31 weeks gestation of pregnancy: Secondary | ICD-10-CM

## 2020-08-29 DIAGNOSIS — Z5329 Procedure and treatment not carried out because of patient's decision for other reasons: Secondary | ICD-10-CM | POA: Diagnosis not present

## 2020-08-29 DIAGNOSIS — O0990 Supervision of high risk pregnancy, unspecified, unspecified trimester: Secondary | ICD-10-CM

## 2020-08-29 DIAGNOSIS — O10013 Pre-existing essential hypertension complicating pregnancy, third trimester: Principal | ICD-10-CM | POA: Diagnosis present

## 2020-08-29 DIAGNOSIS — O163 Unspecified maternal hypertension, third trimester: Secondary | ICD-10-CM | POA: Diagnosis present

## 2020-08-29 LAB — CBC
HCT: 32.3 % — ABNORMAL LOW (ref 36.0–46.0)
Hemoglobin: 11.1 g/dL — ABNORMAL LOW (ref 12.0–15.0)
MCH: 32.5 pg (ref 26.0–34.0)
MCHC: 34.4 g/dL (ref 30.0–36.0)
MCV: 94.4 fL (ref 80.0–100.0)
Platelets: 210 10*3/uL (ref 150–400)
RBC: 3.42 MIL/uL — ABNORMAL LOW (ref 3.87–5.11)
RDW: 13 % (ref 11.5–15.5)
WBC: 8.1 10*3/uL (ref 4.0–10.5)
nRBC: 0 % (ref 0.0–0.2)

## 2020-08-29 LAB — PROTEIN / CREATININE RATIO, URINE
Creatinine, Urine: 97.45 mg/dL
Protein Creatinine Ratio: 1.07 mg/mg{Cre} — ABNORMAL HIGH (ref 0.00–0.15)
Total Protein, Urine: 104 mg/dL

## 2020-08-29 LAB — COMPREHENSIVE METABOLIC PANEL
ALT: 13 U/L (ref 0–44)
AST: 20 U/L (ref 15–41)
Albumin: 2.5 g/dL — ABNORMAL LOW (ref 3.5–5.0)
Alkaline Phosphatase: 89 U/L (ref 38–126)
Anion gap: 6 (ref 5–15)
BUN: 6 mg/dL (ref 6–20)
CO2: 22 mmol/L (ref 22–32)
Calcium: 8.5 mg/dL — ABNORMAL LOW (ref 8.9–10.3)
Chloride: 107 mmol/L (ref 98–111)
Creatinine, Ser: 0.69 mg/dL (ref 0.44–1.00)
GFR, Estimated: >60 mL/min (ref >60)
Glucose, Bld: 85 mg/dL (ref 70–99)
Potassium: 3.5 mmol/L (ref 3.5–5.1)
Sodium: 135 mmol/L (ref 135–145)
Total Bilirubin: 0.8 mg/dL (ref 0.3–1.2)
Total Protein: 5.6 g/dL — ABNORMAL LOW (ref 6.5–8.1)

## 2020-08-29 MED ORDER — CALCIUM CARBONATE ANTACID 500 MG PO CHEW
2.0000 | CHEWABLE_TABLET | ORAL | Status: DC | PRN
  Administered 2020-08-31: 400 mg via ORAL
  Filled 2020-08-29 (×2): qty 2

## 2020-08-29 MED ORDER — NIFEDIPINE 10 MG PO CAPS: 20.0000 mg | ORAL_CAPSULE | ORAL | Status: DC | PRN

## 2020-08-29 MED ORDER — HYDRALAZINE HCL 20 MG/ML IJ SOLN: 10.0000 mg | INTRAMUSCULAR | Status: DC | PRN

## 2020-08-29 MED ORDER — LABETALOL HCL 5 MG/ML IV SOLN: 40.0000 mg | INTRAVENOUS | Status: DC | PRN

## 2020-08-29 MED ORDER — DOCUSATE SODIUM 100 MG PO CAPS
100.0000 mg | ORAL_CAPSULE | Freq: Every day | ORAL | Status: DC
  Administered 2020-08-30 – 2020-09-01 (×2): 100 mg via ORAL
  Filled 2020-08-29 (×3): qty 1

## 2020-08-29 MED ORDER — MAGNESIUM SULFATE 40 GM/1000ML IV SOLN
2.0000 g/h | INTRAVENOUS | Status: AC
  Administered 2020-08-30 (×2): 2 g/h via INTRAVENOUS
  Filled 2020-08-29 (×2): qty 1000

## 2020-08-29 MED ORDER — MAGNESIUM SULFATE BOLUS VIA INFUSION
4.0000 g | Freq: Once | INTRAVENOUS | Status: AC
  Administered 2020-08-29: 4 g via INTRAVENOUS
  Filled 2020-08-29: qty 1000

## 2020-08-29 MED ORDER — BETAMETHASONE SOD PHOS & ACET 6 (3-3) MG/ML IJ SUSP
12.0000 mg | INTRAMUSCULAR | Status: AC
  Administered 2020-08-29 – 2020-08-30 (×2): 12 mg via INTRAMUSCULAR
  Filled 2020-08-29: qty 5

## 2020-08-29 MED ORDER — ZOLPIDEM TARTRATE 5 MG PO TABS: 5.0000 mg | ORAL_TABLET | Freq: Every evening | ORAL | Status: DC | PRN

## 2020-08-29 MED ORDER — ASPIRIN EC 81 MG PO TBEC: 81.0000 mg | DELAYED_RELEASE_TABLET | Freq: Every day | ORAL | Status: DC

## 2020-08-29 MED ORDER — NIFEDIPINE ER OSMOTIC RELEASE 30 MG PO TB24: 60.0000 mg | ORAL_TABLET | Freq: Once | ORAL | Status: DC

## 2020-08-29 MED ORDER — ACETAMINOPHEN 325 MG PO TABS
650.0000 mg | ORAL_TABLET | ORAL | Status: DC | PRN
  Administered 2020-08-30: 650 mg via ORAL
  Filled 2020-08-29: qty 2

## 2020-08-29 MED ORDER — NIFEDIPINE 10 MG PO CAPS: 10.0000 mg | ORAL_CAPSULE | ORAL | Status: DC | PRN

## 2020-08-29 MED ORDER — LABETALOL HCL 5 MG/ML IV SOLN: 20.0000 mg | INTRAVENOUS | Status: DC | PRN

## 2020-08-29 MED ORDER — ACETAMINOPHEN 500 MG PO TABS
1000.0000 mg | ORAL_TABLET | Freq: Once | ORAL | Status: AC
  Administered 2020-08-29: 1000 mg via ORAL
  Filled 2020-08-29: qty 2

## 2020-08-29 MED ORDER — LACTATED RINGERS IV SOLN: INTRAVENOUS | Status: DC

## 2020-08-29 MED ORDER — LABETALOL HCL 5 MG/ML IV SOLN
80.0000 mg | INTRAVENOUS | Status: DC | PRN
Start: 2020-08-29 — End: 2020-09-02

## 2020-08-29 MED ORDER — PRENATAL MULTIVITAMIN CH
1.0000 | ORAL_TABLET | Freq: Every day | ORAL | Status: DC
  Administered 2020-08-30 – 2020-09-01 (×2): 1 via ORAL
  Filled 2020-08-29 (×2): qty 1

## 2020-08-29 NOTE — MAU Note (Signed)
Pt reports she has been followed due to elevated b/p, today got a headache and checked her b/p 177/112, 169/122, 161/115. Is taking nifedipine. Reports decreased fetal movement.

## 2020-08-29 NOTE — H&P (Signed)
History     [id number]  Arrival date and time: 08/29/20 2021    Chief Complaint  Patient presents with  . Hypertension  . Headache     HPI Kelly Lane is a 25 y.o. at [redacted]w[redacted]d by early u/s with PMHx notable for cHTN (on procardia 30mg ), who presents for HTN. She has cHTN started at 14weeks and was started on procardia, took first dose today. Today she endorses a frontal headache which prompted her to take her BP which was 160s/100s. She has not taken any meds for this. No associated vision changes. No cp, sob, ruq pain. No LE edema, although patient does have new periorbital edema. No fever/chills.   AB/Negative/-- (01/20 1612)  OB History    Gravida  2   Para  0   Term  0   Preterm  0   AB  1   Living  0     SAB  0   IAB  1   Ectopic  0   Multiple  1   Live Births  0           Past Medical History:  Diagnosis Date  . Anxiety   . Endometriosis 07/2015  . GERD (gastroesophageal reflux disease)    NO MEDS  . Headache   . Ovarian cyst 07/2015    Past Surgical History:  Procedure Laterality Date  . LAPAROSCOPIC OVARIAN CYSTECTOMY Right 07/17/2015   Procedure: EXCISION OF ENDOMETRIOSIS;  Surgeon: Gae Dry, MD;  Location: [facility] ORS;  Service: Gynecology;  Laterality: Right;  . LAPAROSCOPY N/A 07/17/2015   Procedure: LAPAROSCOPY OPERATIVE;  Surgeon: Gae Dry, MD;  Location: [facility] ORS;  Service: Gynecology;  Laterality: N/A;  . NO PAST SURGERIES      Family History  Problem Relation Age of Onset  . Hypertension Mother     Social History   Socioeconomic History  . Marital status: Single    Spouse name: Not on file  . Number of children: Not on file  . Years of education: Not on file  . Highest education level: Not on file  Occupational History  . Not on file  Tobacco Use  . Smoking status: Never Smoker  . Smokeless tobacco: Never Used  Vaping Use  . Vaping Use: Never used  Substance and Sexual Activity  . Alcohol use: Not  Currently    Comment: every six months maybe 1 drink  . Drug use: No  . Sexual activity: Yes    Partners: Male    Birth control/protection: None    Comment: Pregnant   Other Topics Concern  . Not on file  Social History Narrative  . Not on file   Social Determinants of Health   Financial Resource Strain: Not on file  Food Insecurity: Not on file  Transportation Needs: Not on file  Physical Activity: Not on file  Stress: Not on file  Social Connections: Not on file  Intimate Partner Violence: Not on file    No Known Allergies  No current facility-administered medications on file prior to encounter.   Current Outpatient Medications on File Prior to Encounter  Medication Sig Dispense Refill  . NIFEdipine (PROCARDIA XL) 30 MG 24 hr tablet Take 1 tablet (30 mg total) by mouth in the morning and at bedtime. 60 tablet 3  . cetirizine (ZYRTEC) 10 MG tablet Take 1 tablet (10 mg total) by mouth daily. (Patient not taking: No sig reported) 30 tablet 4  . cyclobenzaprine (FLEXERIL) 5 MG  tablet Take 1 tablet (5 mg total) by mouth at bedtime as needed for muscle spasms. (Patient not taking: No sig reported) 20 tablet 0  . ondansetron (ZOFRAN ODT) 4 MG disintegrating tablet Take 1 tablet (4 mg total) by mouth every 8 (eight) hours as needed for nausea or vomiting. (Patient not taking: No sig reported) 30 tablet 1  . Prenatal Vit-Fe Phos-FA-Omega (VITAFOL GUMMIES) 3.33-0.333-34.8 MG CHEW Chew 3 tablets by mouth daily before breakfast. (Patient not taking: Reported on 08/27/2020) 90 tablet 11     Review of Systems  Constitutional: Negative for chills and fever.  HENT:       Eye swelling   Eyes: Negative for blurred vision and double vision.  Respiratory: Negative for shortness of breath.   Cardiovascular: Negative for chest pain, palpitations and leg swelling.  Gastrointestinal: Negative for abdominal pain, nausea and vomiting.  Genitourinary: Negative for dysuria, flank pain, hematuria  and urgency.  Skin: Negative for itching and rash.  Neurological: Positive for headaches. Negative for dizziness, loss of consciousness and weakness.     Pertinent positives and negative per HPI, all others reviewed and negative  Physical Exam   BP (!) 147/96   Pulse 72   Temp 98.3 F (36.8 C) (Oral)   Resp 15   Ht 5' 2.5" (1.588 m)   Wt 78.9 kg   LMP 01/28/2020   SpO2 98%   BMI 31.32 kg/m   Physical Exam Vitals and nursing note reviewed. Exam conducted with a chaperone present.  Constitutional:      General: She is not in acute distress.    Appearance: Normal appearance. She is normal weight.  HENT:     Head: Normocephalic and atraumatic.     Nose: Nose normal.     Mouth/Throat:     Mouth: Mucous membranes are moist.     Pharynx: Oropharynx is clear.  Eyes:     Extraocular Movements: Extraocular movements intact.     Conjunctiva/sclera: Conjunctivae normal.     Comments: Periorbital edema noted bilaterally   Cardiovascular:     Rate and Rhythm: Normal rate.     Pulses: Normal pulses.  Pulmonary:     Effort: Pulmonary effort is normal.  Abdominal:     Palpations: Abdomen is soft.  Musculoskeletal:        General: Normal range of motion.     Cervical back: Normal range of motion and neck supple.  Skin:    General: Skin is warm and dry.  Neurological:     General: No focal deficit present.     Mental Status: She is alert and oriented to person, place, and time. Mental status is at baseline.  Psychiatric:        Mood and Affect: Mood normal.        Behavior: Behavior normal.     Cervical Exam  not indicated  Bedside Ultrasound n/a  My interpretation: n/a  FHT Baseline 150bpm, mod variability, +accels, no decels Toco: irritable Cat: 1  Labs No results found for this or any previous visit (from the past 24 hour(s)).  Imaging No results found.  MAU Course  Procedures  Lab Orders     CBC     Comprehensive metabolic panel     Protein /  creatinine ratio, urine No orders of the defined types were placed in this encounter.  Imaging Orders  No imaging studies ordered today    MDM moderate  Assessment and Plan  24yo G2P0010 at [redacted]w[redacted]d presents to  MAU for headache and elevated BP.  #preE w/ SF Patient presents to MAU with 1 hour of headache, no associated vision changes or other preE symptoms not relieved with tylenol. She also endorses elevated BP 160s  SBP at home, and upon presentation 154/109, otherwise VSS. preE labs demonstrated p/c ratio 1, otherwise platelets, LFTs wnl. Patient meets criteria for severe preE and will be admitted for BP control, Mg, and BMZ. Discussed with patient and she is amendable with plan and voiced understanding. Report given to Dr. Nelda Marseille who will assume care of the patient at this time.   #FWB FHT Cat 1 NST: reactive  Arrie Senate

## 2020-08-30 ENCOUNTER — Encounter (HOSPITAL_COMMUNITY): Payer: Self-pay | Admitting: Obstetrics & Gynecology

## 2020-08-30 ENCOUNTER — Inpatient Hospital Stay (HOSPITAL_BASED_OUTPATIENT_CLINIC_OR_DEPARTMENT_OTHER): Payer: BC Managed Care – PPO

## 2020-08-30 DIAGNOSIS — Z3A31 31 weeks gestation of pregnancy: Secondary | ICD-10-CM | POA: Diagnosis not present

## 2020-08-30 DIAGNOSIS — O3413 Maternal care for benign tumor of corpus uteri, third trimester: Secondary | ICD-10-CM | POA: Diagnosis not present

## 2020-08-30 DIAGNOSIS — O114 Pre-existing hypertension with pre-eclampsia, complicating childbirth: Secondary | ICD-10-CM | POA: Diagnosis present

## 2020-08-30 DIAGNOSIS — Z3A32 32 weeks gestation of pregnancy: Secondary | ICD-10-CM | POA: Diagnosis not present

## 2020-08-30 DIAGNOSIS — O358XX0 Maternal care for other (suspected) fetal abnormality and damage, not applicable or unspecified: Secondary | ICD-10-CM | POA: Diagnosis not present

## 2020-08-30 DIAGNOSIS — Z3A33 33 weeks gestation of pregnancy: Secondary | ICD-10-CM | POA: Diagnosis not present

## 2020-08-30 DIAGNOSIS — D259 Leiomyoma of uterus, unspecified: Secondary | ICD-10-CM

## 2020-08-30 DIAGNOSIS — O10913 Unspecified pre-existing hypertension complicating pregnancy, third trimester: Secondary | ICD-10-CM

## 2020-08-30 DIAGNOSIS — Z20822 Contact with and (suspected) exposure to covid-19: Secondary | ICD-10-CM | POA: Diagnosis present

## 2020-08-30 DIAGNOSIS — O10013 Pre-existing essential hypertension complicating pregnancy, third trimester: Secondary | ICD-10-CM

## 2020-08-30 DIAGNOSIS — O09892 Supervision of other high risk pregnancies, second trimester: Secondary | ICD-10-CM

## 2020-08-30 DIAGNOSIS — O1413 Severe pre-eclampsia, third trimester: Secondary | ICD-10-CM | POA: Diagnosis not present

## 2020-08-30 DIAGNOSIS — Z6791 Unspecified blood type, Rh negative: Secondary | ICD-10-CM | POA: Diagnosis not present

## 2020-08-30 DIAGNOSIS — O163 Unspecified maternal hypertension, third trimester: Secondary | ICD-10-CM | POA: Diagnosis present

## 2020-08-30 DIAGNOSIS — O360130 Maternal care for anti-D [Rh] antibodies, third trimester, not applicable or unspecified: Secondary | ICD-10-CM

## 2020-08-30 DIAGNOSIS — O1002 Pre-existing essential hypertension complicating childbirth: Secondary | ICD-10-CM | POA: Diagnosis present

## 2020-08-30 DIAGNOSIS — Z141 Cystic fibrosis carrier: Secondary | ICD-10-CM

## 2020-08-30 DIAGNOSIS — O113 Pre-existing hypertension with pre-eclampsia, third trimester: Secondary | ICD-10-CM | POA: Diagnosis not present

## 2020-08-30 DIAGNOSIS — O1414 Severe pre-eclampsia complicating childbirth: Secondary | ICD-10-CM | POA: Diagnosis not present

## 2020-08-30 DIAGNOSIS — Z3A30 30 weeks gestation of pregnancy: Secondary | ICD-10-CM | POA: Diagnosis not present

## 2020-08-30 DIAGNOSIS — O26893 Other specified pregnancy related conditions, third trimester: Secondary | ICD-10-CM | POA: Diagnosis present

## 2020-08-30 DIAGNOSIS — Z8616 Personal history of COVID-19: Secondary | ICD-10-CM | POA: Diagnosis not present

## 2020-08-30 DIAGNOSIS — O98511 Other viral diseases complicating pregnancy, first trimester: Secondary | ICD-10-CM | POA: Insufficient documentation

## 2020-08-30 DIAGNOSIS — U071 COVID-19: Secondary | ICD-10-CM | POA: Insufficient documentation

## 2020-08-30 LAB — COMPREHENSIVE METABOLIC PANEL
ALT: 14 U/L (ref 0–44)
AST: 21 U/L (ref 15–41)
Albumin: 2.5 g/dL — ABNORMAL LOW (ref 3.5–5.0)
Alkaline Phosphatase: 104 U/L (ref 38–126)
Anion gap: 7 (ref 5–15)
BUN: <5 mg/dL — ABNORMAL LOW (ref 6–20)
CO2: 21 mmol/L — ABNORMAL LOW (ref 22–32)
Calcium: 7.9 mg/dL — ABNORMAL LOW (ref 8.9–10.3)
Chloride: 106 mmol/L (ref 98–111)
Creatinine, Ser: 0.69 mg/dL (ref 0.44–1.00)
GFR, Estimated: >60 mL/min (ref >60)
Glucose, Bld: 121 mg/dL — ABNORMAL HIGH (ref 70–99)
Potassium: 3.9 mmol/L (ref 3.5–5.1)
Sodium: 134 mmol/L — ABNORMAL LOW (ref 135–145)
Total Bilirubin: 0.6 mg/dL (ref 0.3–1.2)
Total Protein: 5.5 g/dL — ABNORMAL LOW (ref 6.5–8.1)

## 2020-08-30 LAB — RESP PANEL BY RT-PCR (FLU A&B, COVID) ARPGX2
Influenza A by PCR: NEGATIVE
Influenza B by PCR: NEGATIVE
SARS Coronavirus 2 by RT PCR: NEGATIVE

## 2020-08-30 LAB — CBC
HCT: 35.6 % — ABNORMAL LOW (ref 36.0–46.0)
Hemoglobin: 12.1 g/dL (ref 12.0–15.0)
MCH: 32.7 pg (ref 26.0–34.0)
MCHC: 34 g/dL (ref 30.0–36.0)
MCV: 96.2 fL (ref 80.0–100.0)
Platelets: 225 10*3/uL (ref 150–400)
RBC: 3.7 MIL/uL — ABNORMAL LOW (ref 3.87–5.11)
RDW: 13.1 % (ref 11.5–15.5)
WBC: 8.1 10*3/uL (ref 4.0–10.5)
nRBC: 0 % (ref 0.0–0.2)

## 2020-08-30 LAB — MAGNESIUM: Magnesium: 4.7 mg/dL — ABNORMAL HIGH (ref 1.7–2.4)

## 2020-08-30 LAB — TYPE AND SCREEN
ABO/RH(D): AB NEG
Antibody Screen: POSITIVE

## 2020-08-30 MED ORDER — NIFEDIPINE ER OSMOTIC RELEASE 30 MG PO TB24
30.0000 mg | ORAL_TABLET | Freq: Two times a day (BID) | ORAL | Status: DC
  Administered 2020-08-30 – 2020-09-01 (×5): 30 mg via ORAL
  Filled 2020-08-30 (×5): qty 1

## 2020-08-30 MED ORDER — ASPIRIN EC 81 MG PO TBEC
81.0000 mg | DELAYED_RELEASE_TABLET | Freq: Every day | ORAL | Status: DC
  Administered 2020-08-30 – 2020-09-01 (×3): 81 mg via ORAL
  Filled 2020-08-30 (×3): qty 1

## 2020-08-30 NOTE — Progress Notes (Signed)
OB Note  Patient just got back from u/s, results still pending  Long d/w patient and her support person re: what's going on. Patient feels that she does not have CHTN and she denies ever being told she had HTN and has never been on meds for this in the past.  Looking back at her VS in the computer, she had a few, sporadic mild range BPs, including one in the setting of COVID.  Her PC ratio is also newly elevated at 1070 yesterday and was previously negative. She presented with a HA which she believes is now only being caused by the Mg; she only received tylenol 1gm in the MAU. She states she took her first dose of procardia xl 30 qday about 6-7 hours before presenting to the MAU with elevated BPs and HA at home and her BPs in the MAU were borderline severe and came down to consistently in the mild range w/o any medications, but she was started on Mg in the MAU. Her CBC and CMP are still normal this morning. I ordered a 24hour urine collection to start today.   I told her that her having gHTN vs cHTN does make a difference in terms of plan of care, but I told her we need more information to see if she needs to be admitted until 34wks and delivered then or can she be discharged at some point with close surveillance and to try and get to delivery at 37wks.   Procardia increased to 30 bid. Follow up growth u/s. Follow up 24h urine collection. BMZ #2 at 2300 today. Continue Mg x24h (to stop later on tonight)  Durene Romans MD Attending Center for [employer] (Faculty Practice) 08/30/2020

## 2020-08-30 NOTE — Progress Notes (Signed)
[facility]) NOTE  Kelly Lane is a 25 y.o. G2P0010 with Estimated Date of Delivery: 11/03/20   By  best clinical estimate [redacted]w[redacted]d  who is admitted for chronic HTN with superimposed preeclampsia.  Fetal presentation is cephalic. Length of Stay:  0  Days  Date of admission:08/29/2020  Subjective: Resting comfortably overnight.  Still notes 6/10 headache- no improvement with tylenol.  No nausea/vomiting.  No blurry vision, no RUQ pain. Patient reports the fetal movement as active. Patient reports uterine contraction  activity as none. Patient reports  vaginal bleeding as none. Patient describes fluid per vagina as None.  Vitals:  Blood pressure (!) 143/97, pulse 75, temperature 97.6 F (36.4 C), temperature source Oral, resp. rate 17, height 5' 2.5" (1.588 m), weight 78.9 kg, last menstrual period 01/28/2020, SpO2 98 %, unknown if currently breastfeeding. Vitals:   08/30/20 0108 08/30/20 0202 08/30/20 0300 08/30/20 0411  BP: (!) 146/94 (!) 156/94 138/88 (!) 143/97  Pulse: 66 82 71 75  Resp: 18 19 18 17   Temp: 97.6 F (36.4 C)     TempSrc: Oral     SpO2: 99% 99% 98%   Weight:      Height:       Physical Examination:  General appearance - alert, well appearing, and in no distress Mental status - normal mood, behavior, speech, dress, motor activity, and thought processes Chest - clear to auscultation, no wheezes, rales or rhonchi, symmetric air entry Heart - normal rate and regular rhythm Abdomen - gravid, soft and non-tender Extremities - no calf tenderness, no edema noted Neuro: 2+ DTRs, no clonus Skin - warm and dry  Fetal Monitoring:  Baseline: 115 bpm, Variability: moderate variability, Accelerations: +accels and Decelerations: Absent   reactive   Medications:  Scheduled . betamethasone acetate-betamethasone sodium phosphate  12 mg Intramuscular Q24H  . docusate sodium  100 mg Oral Daily  . NIFEdipine  60 mg Oral Once  . prenatal multivitamin  1  tablet Oral Q1200   I have reviewed the patient's current medications.  ASSESSMENT: G2P0010 [redacted]w[redacted]d Estimated Date of Delivery: 11/03/20  Patient Active Problem List   Diagnosis Date Noted  . Severe preeclampsia 08/29/2020  . Chronic hypertension affecting pregnancy 08/22/2020  . Back pain in pregnancy 07/06/2020  . Rh negative state in antepartum period 06/07/2020   PLAN: 1) cHTN with superimposed preeclampsia -currently on Mag x 24hrs -Procardia increased to 60mg  XL daily -repeat labs to be collected this am  2) Fetal well being -NICHD- Cat. I- currently on continuous monitoring due to Magnesium, will transition to q shift monitoring -growth [place] today -BMZ to be given for fetal lung maturity -NICU consult today  3) Maternal well being -tylenol as needed for headache -continue PNV daily -SCDs for DVT prophylaxis  DISPO: Reviewed diagnosis of preeclampsia and discussed goal of delivery at 34wks pending maternal/fetal well being.  For now, continue with in-house monitoring as outlined above  Janyth Pupa, DO Attending [place], [facility] for [employer], [place]

## 2020-08-31 DIAGNOSIS — Z3A30 30 weeks gestation of pregnancy: Secondary | ICD-10-CM

## 2020-08-31 DIAGNOSIS — Z3A31 31 weeks gestation of pregnancy: Secondary | ICD-10-CM

## 2020-08-31 DIAGNOSIS — Z8616 Personal history of COVID-19: Secondary | ICD-10-CM | POA: Diagnosis not present

## 2020-08-31 DIAGNOSIS — Z3490 Encounter for supervision of normal pregnancy, unspecified, unspecified trimester: Secondary | ICD-10-CM

## 2020-08-31 DIAGNOSIS — O1413 Severe pre-eclampsia, third trimester: Secondary | ICD-10-CM | POA: Diagnosis not present

## 2020-08-31 DIAGNOSIS — Z3A32 32 weeks gestation of pregnancy: Secondary | ICD-10-CM

## 2020-08-31 LAB — PROTEIN, URINE, 24 HOUR
Collection Interval-UPROT: 24 hours
Protein, 24H Urine: 943 mg/d — ABNORMAL HIGH (ref 50–100)
Protein, Urine: 41 mg/dL
Urine Total Volume-UPROT: 2300 mL

## 2020-08-31 LAB — CREATININE, URINE, 24 HOUR
Collection Interval-UCRE24: 24 hours
Creatinine, 24H Ur: 1311 mg/d (ref 600–1800)
Creatinine, Urine: 56.98 mg/dL
Urine Total Volume-UCRE24: 2300 mL

## 2020-08-31 MED ORDER — ASPIRIN 81 MG PO TBEC: 81.0000 mg | DELAYED_RELEASE_TABLET | Freq: Every day | ORAL | 11 refills | Status: DC

## 2020-08-31 MED ORDER — NIFEDIPINE ER 30 MG PO TB24: 30.0000 mg | ORAL_TABLET | Freq: Two times a day (BID) | ORAL | 2 refills | Status: DC

## 2020-08-31 NOTE — Progress Notes (Signed)
Daily Antepartum Note  Admission Date: 08/29/2020 Current Date: 08/31/2020 12:24 PM  Wednesday I Scalf is a 25 y.o. G2P0010 @ [redacted]w[redacted]d, HD#3, admitted for BP control with cHTN vs superimposed severe pre-eclampsia on cHTN vs gHTN progressed to severe pre-eclampsia.  Pregnancy complicated by:  Patient Active Problem List   Diagnosis Date Noted  . Pregnancy with adoption planned 08/31/2020  . COVID-19 affecting pregnancy in first trimester 08/30/2020  . Elevated blood pressure affecting pregnancy in third trimester, antepartum 08/30/2020  . Severe preeclampsia 08/29/2020  . Chronic hypertension affecting pregnancy 08/22/2020  . Back pain in pregnancy 07/06/2020  . Rh negative state in antepartum period 06/07/2020  . Supervision of normal pregnancy, antepartum 05/24/2020  . Endometriosis 07/17/2015    Overnight/24hr events:  Patient came off Mg  Subjective:  No HA anymore. No s/s of pre-eclampsia and no OB s/s.   Objective:    Current Vital Signs 24h Vital Sign Ranges  T 98.2 F (36.8 C) Temp  Avg: 98 F (36.7 C)  Min: 97.6 F (36.4 C)  Max: 98.3 F (36.8 C)  BP (!) 145/94 BP  Min: 97/79  Max: 150/102  HR 88 Pulse  Avg: 82.7  Min: 71  Max: 91  RR 16 Resp  Avg: 17.2  Min: 16  Max: 18  SaO2 100 %  (room air) SpO2  Avg: 99 %  Min: 98 %  Max: 100 %       24 Hour I/O Current Shift I/O  Time Ins Outs 04/28 0701 - 04/29 0700 In: 3418.9 [P.O.:1437; I.V.:1981.9] Out: 2350 [Urine:2350] No intake/output data recorded.   Patient Vitals for the past 24 hrs:  BP Temp Temp src Pulse Resp SpO2  08/31/20 1112 (!) 145/94 98.2 F (36.8 C) Oral 88 16 100 %  08/31/20 0759 119/71 98.1 F (36.7 C) Oral 71 16 99 %  08/31/20 0617 97/79 97.8 F (36.6 C) Oral 88 16 100 %  08/31/20 0530 -- -- -- -- -- 98 %  08/31/20 0238 (!) 146/96 97.6 F (36.4 C) Oral 79 18 99 %  08/30/20 2256 (!) 146/81 98.3 F (36.8 C) Oral 74 18 99 %  08/30/20 2037 (!) 150/102 98.1 F (36.7 C) Oral 91 18 --  08/30/20 1830  -- -- -- -- 18 --  08/30/20 1730 -- -- -- -- 17 --  08/30/20 1630 -- -- -- -- 17 --  08/30/20 1538 131/80 97.7 F (36.5 C) Oral 88 18 98 %  08/30/20 1430 -- -- -- -- 17 --  08/30/20 1310 -- -- -- -- 17 --   FHT: 120 baseline, +accels, no decel, mod variability Tocometry: quiet  Physical exam: General: Well nourished, well developed female in no acute distress. Abdomen: gravid nttp Respiratory: no respiratory distress Extremities: no clubbing, cyanosis or edema Skin: Warm and dry.   Medications: Current Facility-Administered Medications  Medication Dose Route Frequency Provider Last Rate Last Admin  . acetaminophen (TYLENOL) tablet 650 mg  650 mg Oral Q4H PRN Janyth Pupa, DO   650 mg at 08/30/20 0746  . aspirin EC tablet 81 mg  81 mg Oral Daily Aletha Halim, MD   81 mg at 08/31/20 1015  . calcium carbonate (TUMS - dosed in mg elemental calcium) chewable tablet 400 mg of elemental calcium  2 tablet Oral Q4H PRN Janyth Pupa, DO   400 mg of elemental calcium at 08/31/20 0234  . docusate sodium (COLACE) capsule 100 mg  100 mg Oral Daily Janyth Pupa, DO  100 mg at 08/30/20 0957  . labetalol (NORMODYNE) injection 20 mg  20 mg Intravenous PRN Janyth Pupa, DO       And  . labetalol (NORMODYNE) injection 40 mg  40 mg Intravenous PRN Janyth Pupa, DO       And  . labetalol (NORMODYNE) injection 80 mg  80 mg Intravenous PRN Janyth Pupa, DO       And  . hydrALAZINE (APRESOLINE) injection 10 mg  10 mg Intravenous PRN Janyth Pupa, DO      . lactated ringers infusion   Intravenous Continuous Arrie Senate, MD   Stopped at 08/30/20 2251  . NIFEdipine (PROCARDIA-XL/NIFEDICAL-XL) 24 hr tablet 30 mg  30 mg Oral BID Aletha Halim, MD   30 mg at 08/31/20 1015  . prenatal multivitamin tablet 1 tablet  1 tablet Oral Q1200 Janyth Pupa, DO   1 tablet at 08/30/20 0957  . zolpidem (AMBIEN) tablet 5 mg  5 mg Oral QHS PRN Janyth Pupa, DO        Labs:  Recent Labs   Lab 08/25/20 0020 08/29/20 2121 08/30/20 0646  WBC 8.6 8.1 8.1  HGB 10.5* 11.1* 12.1  HCT 30.1* 32.3* 35.6*  PLT 210 210 225    Recent Labs  Lab 08/25/20 0020 08/29/20 2121 08/30/20 0646  NA 137 135 134*  K 3.2* 3.5 3.9  CL 109 107 106  CO2 23 22 21*  BUN 6 6 <5*  CREATININE 0.78 0.69 0.69  CALCIUM 8.7* 8.5* 7.9*  PROT 5.5* 5.6* 5.5*  BILITOT 0.5 0.8 0.6  ALKPHOS 87 89 104  ALT 14 13 14   AST 19 20 21   GLUCOSE 94 85 121*     Radiology:  4/28: efw 53%, 6378HY, ac 85%, cephalic, bpp 8/8, afi 13  Assessment & Plan:  Pt doing well *Pregnancy: reactive NST *HTN: follow up BPs for today. 24h urine collection just sent and follow up results *Preterm: s/p bmz on 4/27 and 4/28. Consult NICU prn *PPx: SCDs, OOB ad lib *FEN/GI: SLIV, regular diet *Dispo: pending BPs and 24h urine result  Durene Romans MD [facility] [place] [facility] GYN Consult Phone: [phone] (M-F, 0800-1700) & [phone] (Off hours, weekends, holidays)

## 2020-08-31 NOTE — Discharge Instructions (Signed)
Preeclampsia and Eclampsia Preeclampsia is a serious condition that may develop during pregnancy. This condition involves high blood pressure during pregnancy and causes symptoms such as headaches, vision changes, and increased swelling in the legs, hands, and face. Preeclampsia occurs after 20 weeks of pregnancy. Eclampsia is a seizure that happens from worsening preeclampsia. Diagnosing and managing preeclampsia early is important. If not treated early, it can cause serious problems for mother and baby. There is no cure for this condition. However, during pregnancy, delivering the baby may be the best treatment for preeclampsia or eclampsia. For most women, symptoms of preeclampsia and eclampsia go away after giving birth. In rare cases, a woman may develop preeclampsia or eclampsia after giving birth. This usually occurs within 48 hours after childbirth but may occur up to 6 weeks after giving birth. What are the causes? The cause of this condition is not known. What increases the risk? The following factors make you more likely to develop preeclampsia:  Being pregnant for the first time or being pregnant with multiples.  Having had preeclampsia or a condition called hemolysis, elevated liver enzymes, and low platelet count (HELLP)syndrome during a past pregnancy.  Having a family history of preeclampsia.  Being older than age 26.  Being obese.  Becoming pregnant through fertility treatments. Conditions that reduce blood flow or oxygen to your placenta and baby may also increase your risk. These include:  High blood pressure before, during, or immediately following pregnancy.  Kidney disease.  Diabetes.  Blood clotting disorders.  Autoimmune diseases, such as lupus.  Sleep apnea. What are the signs or symptoms? Common symptoms of this condition include:  A severe, throbbing headache that does not go away.  Vision problems, such as blurred or double vision and light  sensitivity.  Pain in the stomach, especially the right upper region.  Pain in the shoulder. Other symptoms that may develop as the condition gets worse include:  Sudden weight gain because of fluid buildup in the body. This causes swelling of the face, hands, legs, and feet.  Severe nausea and vomiting.  Urinating less than usual.  Shortness of breath.  Seizures. How is this diagnosed? Your health care provider will ask you about symptoms and check for signs of preeclampsia during your prenatal visits. You will also have routine tests, including:  Checking your blood pressure.  Urine tests to check for protein.  Blood tests to assess your organ function.  Monitoring your baby's heart rate.  Ultrasounds to check fetal growth.   How is this treated? You and your health care provider will determine the treatment that is best for you. Treatment may include:  Frequent prenatal visits to check for preeclampsia.  Medicine to lower your blood pressure.  Medicine to prevent seizures.  Low-dose aspirin during your pregnancy.  Staying in the hospital, in severe cases. You will be given medicines to control your blood pressure and the amount of fluids in your body.  Delivering your baby. Work with your health care provider to manage any chronic health conditions, such as diabetes or kidney problems. Also, work with your health care provider to manage weight gain during pregnancy. Follow these instructions at home: Eating and drinking  Drink enough fluid to keep your urine pale yellow.  Avoid caffeine. Caffeine may increase blood pressure and heart rate and lead to dehydration.  Reduce the amount of salt that you eat. Lifestyle  Do not use any products that contain nicotine or tobacco. These products include cigarettes, chewing tobacco, and  vaping devices, such as e-cigarettes. If you need help quitting, ask your health care provider.  Do not use alcohol or drugs.  Avoid  stress as much as possible.  Rest and get plenty of sleep. General instructions  Take over-the-counter and prescription medicines only as told by your health care provider.  When lying down, lie on your left side. This keeps pressure off your major blood vessels.  When sitting or lying down, raise (elevate) your feet. Try putting pillows underneath your lower legs.  Exercise regularly. Ask your health care provider what kinds of exercise are best for you.  Check your blood pressure as often as recommended by your health care provider.  Keep all prenatal and follow-up visits. This is important.   Contact a health care provider if:  You have symptoms that may need treatment or closer monitoring. These include: ? Headaches. ? Stomach pain or nausea and vomiting. ? Shoulder pain. ? Vision problems, such as spots in front of your eyes or blurry vision. ? Sudden weight gain or increased swelling in your face, hands, legs, and feet. ? Increased anxiety or feeling of impending doom. ? Signs or symptoms of labor. Get help right away if:  You have any of the following symptoms: ? A seizure. ? Shortness of breath or trouble breathing. ? Trouble speaking or slurred speech. ? Fainting. ? Chest pain. These symptoms may represent a serious problem that is an emergency. Do not wait to see if the symptoms will go away. Get medical help right away. Call your local emergency services (911 in the [place] Do not drive yourself to the hospital. Summary  Preeclampsia is a serious condition that may develop during pregnancy.  Diagnosing and treating preeclampsia early is very important.  Keep all prenatal and follow-up visits. This is important.  Get help right away if you have a seizure, shortness of breath or trouble breathing, trouble speaking or slurred speech, chest pain, or fainting. This information is not intended to replace advice given to you by your health care provider. Make sure you  discuss any questions you have with your health care provider. Document Revised: 01/12/2020 Document Reviewed: 01/12/2020 Elsevier Patient Education  2021 [employer].

## 2020-08-31 NOTE — Consult Note (Signed)
[facility]   Prenatal Consult       08/31/2020  7:16 PM  I was asked by Dr. Ilda Basset to consult on this patient for anticipated preterm delivery. I had the pleasure of meeting with Kelly Lane today along with Kelly Lane, who plans to adopt her baby. Kelly Lane is a 25 year old G2P0 woman who is currently at [redacted]w[redacted]d and has been newly diagnosed with pre-eclampsia. The plan is for inpatient management and delivery at 34 weeks, sooner if indicated. She has received two doses of betamethasone with the most recent dose given yesterday, 08/30/20. She is having a baby boy who is well grown.   I explained that the neonatal intensive care team would be present for the delivery and outlined the likely delivery room course for this baby including routine resuscitation and NRP-guided approaches to the treatment of respiratory distress. We discussed other common problems associated with prematurity including respiratory distress syndrome, apnea, feeding issues, and infection risk. We briefly discussed IVH/PVL, ROP, and NEC and that these are complications associated with prematurity, but that by 30 weeks are uncommon.   We discussed the average length of stay but I noted that the actual LOS would depend on the severity of problems encountered and response to treatments. We discussed visitation policies and the resources available while her child is in the hospital.  We discussed the importance of good nutrition and various methods of providing nutrition (parenteral hyperalimentation, gavage feedings and/or oral feeding). We discussed the benefits of human milk and the option of using donor breast milk as a bridge. The adoptive family has identified someone who plans to provide breast milk.  Thank you for involving [place] in the care of this patient. A member of our team will be available should the family have additional questions. Time for consultation approximately 20 minutes.  Renato Shin, MD Neonatal  Medicine

## 2020-08-31 NOTE — Discharge Summary (Signed)
Antenatal Physician Discharge Summary - Patient Left Against Medical Advice  Patient ID: Kelly Lane MRN: [id number] DOB/AGE: 1995/09/17 24 y.o.  Admit date: 08/29/2020 Discharge date: 08/31/2020  Admission Diagnoses:  Principal Problem:   Severe preeclampsia, third trimester Active Problems:   Pregnancy with adoption planned   [redacted] weeks gestation of pregnancy   Discharge Diagnoses:  The same,  Left against medical advice on 08/31/20  Prenatal Procedures: NST, Preeclampsia treatment and MFM Ultrasound  Consults: Neonatology  Hospital Course:  Kelly Lane is a 25 y.o. G2P0010 with IUP at [redacted]w[redacted]d admitted for severe preeclampsia. She had elevated BP and a severe headache.  Was started on magnesium sulfate for eclampsia prophylaxis, and also started on Nifedipine for BP control. Had reassuring FHR tracing through her admission, BPP on 08/30/20 was 8/8, EFW 1711 g/53%.    She received betamethasone x 2 doses.  She was also seen by Neonatology during her stay.  Given that she had severe preeclampsia, she was informed that the plan for inpatient admission until delivery, which could occur with worsening BP, maternal-fetal status or at the latest at 34 weeks.  Patient did not like this plan and started talking about leaving on 08/31/20.   She had multiple conversations with her physician and nurses but still desired to leave.  She signed the forms denoting she was leaving against medical advice and left on 09/01/20.  Of note, she was prescribed her Nifedipine dosage, and message was sent to [facility] to schedule her for weekly MD visits and MFM BPPs until her delivery.   Discharge Vitals: Temp:  [97.6 F (36.4 C)-99.3 F (37.4 C)] 99.3 F (37.4 C) (04/29 1942) Pulse Rate:  [71-102] 79 (04/29 1942) Resp:  [16-18] 16 (04/29 1942) BP: (97-152)/(71-97) 147/90 (04/29 1942) SpO2:  [98 %-100 %] 100 % (04/29 1942) Physical Examination: Not done as patient left AMA    Fetal monitoring (as recorded):  FHR: 125 bpm, Variability: moderate, Accelerations: Present, Decelerations: Absent  Uterine activity: No contractions   Significant Diagnostic Studies:  Results for orders placed or performed during the hospital encounter of 08/29/20 (from the past 168 hour(s))  Protein / creatinine ratio, urine   Collection Time: 08/29/20  8:54 PM  Result Value Ref Range   Creatinine, Urine 97.45 mg/dL   Total Protein, Urine 104 mg/dL   Protein Creatinine Ratio 1.07 (H) 0.00 - 0.15 mg/mg[Cre]  CBC   Collection Time: 08/29/20  9:21 PM  Result Value Ref Range   WBC 8.1 4.0 - 10.5 K/uL   RBC 3.42 (L) 3.87 - 5.11 MIL/uL   Hemoglobin 11.1 (L) 12.0 - 15.0 g/dL   HCT 32.3 (L) 36.0 - 46.0 %   MCV 94.4 80.0 - 100.0 fL   MCH 32.5 26.0 - 34.0 pg   MCHC 34.4 30.0 - 36.0 g/dL   RDW 13.0 11.5 - 15.5 %   Platelets 210 150 - 400 K/uL   nRBC 0.0 0.0 - 0.2 %  Comprehensive metabolic panel   Collection Time: 08/29/20  9:21 PM  Result Value Ref Range   Sodium 135 135 - 145 mmol/L   Potassium 3.5 3.5 - 5.1 mmol/L   Chloride 107 98 - 111 mmol/L   CO2 22 22 - 32 mmol/L   Glucose, Bld 85 70 - 99 mg/dL   BUN 6 6 - 20 mg/dL   Creatinine, Ser 0.69 0.44 - 1.00 mg/dL   Calcium 8.5 (L) 8.9 - 10.3 mg/dL   Total Protein 5.6 (L) 6.5 -  8.1 g/dL   Albumin 2.5 (L) 3.5 - 5.0 g/dL   AST 20 15 - 41 U/L   ALT 13 0 - 44 U/L   Alkaline Phosphatase 89 38 - 126 U/L   Total Bilirubin 0.8 0.3 - 1.2 mg/dL   GFR, Estimated >60 >60 mL/min   Anion gap 6 5 - 15  Resp Panel by RT-PCR (Flu A&B, Covid) Nasopharyngeal Swab   Collection Time: 08/29/20 11:25 PM   Specimen: Nasopharyngeal Swab; Nasopharyngeal(NP) swabs in vial transport medium  Result Value Ref Range   SARS Coronavirus 2 by RT PCR NEGATIVE NEGATIVE   Influenza A by PCR NEGATIVE NEGATIVE   Influenza B by PCR NEGATIVE NEGATIVE  Type and screen [facility]   Collection Time: 08/29/20 11:38 PM  Result Value Ref Range   ABO/RH(D) AB NEG    Antibody Screen  POS    Sample Expiration 09/01/2020,2359    Antibody Identification      PASSIVELY ACQUIRED ANTI-D Performed at [facility], [address], [place], [place] [address]   Magnesium   Collection Time: 08/30/20  6:46 AM  Result Value Ref Range   Magnesium 4.7 (H) 1.7 - 2.4 mg/dL  Comprehensive metabolic panel   Collection Time: 08/30/20  6:46 AM  Result Value Ref Range   Sodium 134 (L) 135 - 145 mmol/L   Potassium 3.9 3.5 - 5.1 mmol/L   Chloride 106 98 - 111 mmol/L   CO2 21 (L) 22 - 32 mmol/L   Glucose, Bld 121 (H) 70 - 99 mg/dL   BUN <5 (L) 6 - 20 mg/dL   Creatinine, Ser 0.69 0.44 - 1.00 mg/dL   Calcium 7.9 (L) 8.9 - 10.3 mg/dL   Total Protein 5.5 (L) 6.5 - 8.1 g/dL   Albumin 2.5 (L) 3.5 - 5.0 g/dL   AST 21 15 - 41 U/L   ALT 14 0 - 44 U/L   Alkaline Phosphatase 104 38 - 126 U/L   Total Bilirubin 0.6 0.3 - 1.2 mg/dL   GFR, Estimated >60 >60 mL/min   Anion gap 7 5 - 15  CBC   Collection Time: 08/30/20  6:46 AM  Result Value Ref Range   WBC 8.1 4.0 - 10.5 K/uL   RBC 3.70 (L) 3.87 - 5.11 MIL/uL   Hemoglobin 12.1 12.0 - 15.0 g/dL   HCT 35.6 (L) 36.0 - 46.0 %   MCV 96.2 80.0 - 100.0 fL   MCH 32.7 26.0 - 34.0 pg   MCHC 34.0 30.0 - 36.0 g/dL   RDW 13.1 11.5 - 15.5 %   Platelets 225 150 - 400 K/uL   nRBC 0.0 0.0 - 0.2 %  Creatinine, urine, 24 hour   Collection Time: 08/30/20  9:20 AM  Result Value Ref Range   Urine Total Volume-UCRE24 2,300 mL   Collection Interval-UCRE24 24 hours   Creatinine, Urine 56.98 mg/dL   Creatinine, 24H Ur 1,311 600 - 1,800 mg/day  Protein, urine, 24 hour   Collection Time: 08/30/20  9:20 AM  Result Value Ref Range   Urine Total Volume-UPROT 2,300 mL   Collection Interval-UPROT 24 hours   Protein, Urine 41 mg/dL   Protein, 24H Urine 943 (H) 50 - 100 mg/day  Results for orders placed or performed during the hospital encounter of 08/24/20 (from the past 168 hour(s))  Protein / creatinine ratio, urine   Collection Time: 08/24/20 11:53 PM   Result Value Ref Range   Creatinine, Urine 138.08 mg/dL  Total Protein, Urine 22 mg/dL   Protein Creatinine Ratio 0.16 (H) 0.00 - 0.15 mg/mg[Cre]  Urinalysis, Routine w reflex microscopic   Collection Time: 08/24/20 11:59 PM  Result Value Ref Range   Color, Urine YELLOW YELLOW   APPearance HAZY (A) CLEAR   Specific Gravity, Urine 1.011 1.005 - 1.030   pH 6.0 5.0 - 8.0   Glucose, UA NEGATIVE NEGATIVE mg/dL   Hgb urine dipstick NEGATIVE NEGATIVE   Bilirubin Urine NEGATIVE NEGATIVE   Ketones, ur NEGATIVE NEGATIVE mg/dL   Protein, ur NEGATIVE NEGATIVE mg/dL   Nitrite NEGATIVE NEGATIVE   Leukocytes,Ua NEGATIVE NEGATIVE  CBC   Collection Time: 08/25/20 12:20 AM  Result Value Ref Range   WBC 8.6 4.0 - 10.5 K/uL   RBC 3.18 (L) 3.87 - 5.11 MIL/uL   Hemoglobin 10.5 (L) 12.0 - 15.0 g/dL   HCT 30.1 (L) 36.0 - 46.0 %   MCV 94.7 80.0 - 100.0 fL   MCH 33.0 26.0 - 34.0 pg   MCHC 34.9 30.0 - 36.0 g/dL   RDW 13.0 11.5 - 15.5 %   Platelets 210 150 - 400 K/uL   nRBC 0.0 0.0 - 0.2 %  Comprehensive metabolic panel   Collection Time: 08/25/20 12:20 AM  Result Value Ref Range   Sodium 137 135 - 145 mmol/L   Potassium 3.2 (L) 3.5 - 5.1 mmol/L   Chloride 109 98 - 111 mmol/L   CO2 23 22 - 32 mmol/L   Glucose, Bld 94 70 - 99 mg/dL   BUN 6 6 - 20 mg/dL   Creatinine, Ser 0.78 0.44 - 1.00 mg/dL   Calcium 8.7 (L) 8.9 - 10.3 mg/dL   Total Protein 5.5 (L) 6.5 - 8.1 g/dL   Albumin 2.4 (L) 3.5 - 5.0 g/dL   AST 19 15 - 41 U/L   ALT 14 0 - 44 U/L   Alkaline Phosphatase 87 38 - 126 U/L   Total Bilirubin 0.5 0.3 - 1.2 mg/dL   GFR, Estimated >60 >60 mL/min   Anion gap 5 5 - 15   [place] [facility] OB Follow Up  Result Date: 08/30/2020 ----------------------------------------------------------------------  OBSTETRICS REPORT                       (Signed Final 08/30/2020 04:36 pm) ---------------------------------------------------------------------- Patient Info  ID #:       [id number]                           D.O.B.:  1995-09-26 (24 yrs)  Name:       Kelly Lane                   Visit Date: 08/30/2020 09:15 am ---------------------------------------------------------------------- Performed By  Attending:        Johnell Comings MD         Ref. Address:     [facility]                                                             GYN  Performed By:     Berlinda Last          Secondary Phy.:   [facility]  [facility]                                                             Care  Referred By:      Alessandra Bevels             [facility] ---------------------------------------------------------------------- Orders  #  Description                           Code        Ordered By  1  [place] MFM OB FOLLOW UP                   74128.78    Myna Hidalgo ----------------------------------------------------------------------  #  Order #                     Accession #                Episode #  1  [id number]                   [id number]                 [id number] ---------------------------------------------------------------------- Indications  Hypertension - Chronic with superimposed       O11.9 O10.919  preeclampsia  [redacted] weeks gestation of pregnancy                Z3A.30  Echogenic intracardiac focus of the heart      O35.8XX0  (EIF)  Uterine fibroids                               O34.10  Rh negative state in antepartum                O36.0190  Negative AFP(Low Risk NIPS)  Cystic Fibrosis (CF) Carrier, second trimester O09.892 ---------------------------------------------------------------------- Fetal Evaluation  Num Of Fetuses:         1  Fetal Heart Rate(bpm):  111  Cardiac Activity:       Observed  Presentation:           Cephalic  Placenta:               Posterior  P. Cord Insertion:      Previously Visualized  Amniotic Fluid  AFI FV:      Within normal limits  AFI Sum(cm)     %Tile       Largest Pocket(cm)  13.31            41          6.6  RUQ(cm)       RLQ(cm)       LUQ(cm)        LLQ(cm)  6.6           3.03  1.93           1.75 ---------------------------------------------------------------------- Biophysical Evaluation  Amniotic F.V:   Within normal limits       F. Tone:        Observed  F. Movement:    Observed                   Score:          8/8  F. Breathing:   Observed ---------------------------------------------------------------------- Biometry  BPD:        75  mm     G. Age:  30w 1d         21  %    CI:        73.52   %    70 - 86                                                          FL/HC:      21.4   %    19.3 - 21.3  HC:      277.9  mm     G. Age:  30w 3d         10  %    HC/AC:      1.01        0.96 - 1.17  AC:      275.1  mm     G. Age:  31w 4d         72  %    FL/BPD:     79.3   %    71 - 87  FL:       59.5  mm     G. Age:  31w 0d         44  %    FL/AC:      21.6   %    20 - 24  Est. FW:    1711  gm    3 lb 12 oz      53  % ---------------------------------------------------------------------- OB History  Gravidity:    2         Prem:   0         SAB:   0  TOP:          1       Ectopic:  0        Living: 0 ---------------------------------------------------------------------- Gestational Age  LMP:           30w 5d        Date:  01/28/20                 EDD:   11/03/20  U/S Today:     30w 6d                                        EDD:   11/02/20  Best:          30w 5d     Det. By:  LMP  (01/28/20)          EDD:   11/03/20 ---------------------------------------------------------------------- Anatomy  Cranium:               Appears normal  LVOT:                   Previously seen  Cavum:                 Appears normal         Aortic Arch:            Previously seen  Ventricles:            Appears normal         Ductal Arch:            Previously seen  Choroid Plexus:        Previously seen        Diaphragm:              Appears normal  Cerebellum:            Previously seen        Stomach:                 Appears normal, left                                                                        sided  Posterior Fossa:       Previously seen        Abdomen:                Appears normal  Nuchal Fold:           Previously seen        Abdominal Wall:         Previously seen  Face:                  Orbits and profile     Cord Vessels:           Previously seen                         previously seen  Lips:                  Previously seen        Kidneys:                Appear normal  Palate:                Appears normal         Bladder:                Appears normal  Thoracic:              Appears normal         Spine:                  Previously seen  Heart:                 Previously seen, EIF   Upper Extremities:      Previously seen  RVOT:                  Previously seen        Lower Extremities:      Previously seen  Other:  Female gender previously seen.  Heels previously seen. Hands/RIGHT          5th digit previously visualized. Nasal bone previously visualized.          Lenses visualized. VC, 3VV and 3VTV previously  visualized. ---------------------------------------------------------------------- Cervix Uterus Adnexa  Cervix  Not visualized (advanced GA >24wks) ---------------------------------------------------------------------- Comments  This patient was seen for a follow up growth scan due to  possible preeclampsia and chronic hypertension.  The fetal growth and amniotic fluid level appears appropriate  for her gestational age.  A biophysical profile performed today was 8 out of 8. ----------------------------------------------------------------------                   Johnell Comings, MD Electronically Signed Final Report   08/30/2020 04:36 pm ----------------------------------------------------------------------   No future appointments.  Discharge Condition: Stable  Discharge disposition: 07-Left Against Medical Advice/Left Without Being Seen/Elopement        Allergies as of 08/31/2020    No Known Allergies     Medication List    TAKE these medications   aspirin 81 MG EC tablet Take 1 tablet (81 mg total) by mouth daily. Swallow whole. Start taking on: September 01, 2020   cetirizine 10 MG tablet Commonly known as: ZYRTEC Take 1 tablet (10 mg total) by mouth daily.   cyclobenzaprine 5 MG tablet Commonly known as: FLEXERIL Take 1 tablet (5 mg total) by mouth at bedtime as needed for muscle spasms.   NIFEdipine 30 MG 24 hr tablet Commonly known as: ADALAT CC Take 1 tablet (30 mg total) by mouth 2 (two) times daily. Start taking on: September 01, 2020 What changed: when to take this   ondansetron 4 MG disintegrating tablet Commonly known as: Zofran ODT Take 1 tablet (4 mg total) by mouth every 8 (eight) hours as needed for nausea or vomiting.   Vitafol Gummies 3.33-0.333-34.8 MG Chew Chew 3 tablets by mouth daily before breakfast.       Follow-up [facility]. Schedule an appointment as soon as possible for a visit in 1 week(s).   Specialty: Obstetrics and Gynecology Why: Please call to be seen within the week, you also will need ultrasound for evaluation of fetal well being Contact information: [address] [place] [phone]              Total discharge time: 15 minutes   Signed: Arlina Robes, M.D. 08/31/2020, 10:20 PM

## 2020-08-31 NOTE — Progress Notes (Signed)
OB Note  Long d/w pt re: PC ratio of 943 c/w prior PC ratio of 1070.  I looked back again at her BPs and I would not give her the dx of cHTN and that her BPs were due to her COVID dx in late 2021/early 2022. Given this, I told her that she has the dx of severe pre-eclampsia based on severe range BPs needing medications in conjunction with new proteinuria and because of this I told her I recommend inpatient admission until delivery and hopefully we can get her 34wks, which would be the end point.  Patient understanding of this   Durene Romans MD Attending [facility] (Faculty Practice) 08/31/2020 Time: [id number]

## 2020-09-01 ENCOUNTER — Encounter (HOSPITAL_COMMUNITY): Payer: Self-pay | Admitting: Obstetrics and Gynecology

## 2020-09-01 DIAGNOSIS — Z3A31 31 weeks gestation of pregnancy: Secondary | ICD-10-CM

## 2020-09-01 DIAGNOSIS — O1413 Severe pre-eclampsia, third trimester: Secondary | ICD-10-CM

## 2020-09-01 NOTE — Progress Notes (Signed)
Spoke with Dr. Rip Harbour regarding pt output. (287mL since 10am). Patient had refused to go to the bathroom. Orders received for In and out cath. Patient refused and went to the bathroom. Output at 1843: 275mL.

## 2020-09-01 NOTE — Progress Notes (Signed)
Daily Antepartum Note  Admission Date: 08/29/2020 Current Date: 09/01/2020 8:44 AM  Kelly Lane is a 25 y.o. G2P0010 @ [redacted]w[redacted]d, HD#4, admitted for BP control with cHTN vs superimposed severe pre-eclampsia on cHTN vs gHTN progressed to severe pre-eclampsia.  Pregnancy complicated by:  Patient Active Problem List   Diagnosis Date Noted  . Pregnancy with adoption planned 08/31/2020  . [redacted] weeks gestation of pregnancy 08/31/2020  . COVID-19 affecting pregnancy in first trimester 08/30/2020  . Severe preeclampsia, third trimester 08/29/2020  . Back pain in pregnancy 07/06/2020  . Rh negative state in antepartum period 06/07/2020  . Supervision of high-risk pregnancy 05/24/2020    Overnight/24hr events:  Patient threatened to leave AMA twice, but decided to stay.  Subjective:  No HA anymore. No s/s of pre-eclampsia and no OB s/s.   Objective:    Current Vital Signs 24h Vital Sign Ranges  T 98.5 F (36.9 C) Temp  Avg: 98.5 F (36.9 C)  Min: 98 F (36.7 C)  Max: 99.3 F (37.4 C)  BP 103/61 BP  Min: 103/61  Max: 162/103  HR 68 Pulse  Avg: 82.3  Min: 68  Max: 102  RR 16 Resp  Avg: 16.8  Min: 16  Max: 18  SaO2 100 %  (room air) SpO2  Avg: 100 %  Min: 100 %  Max: 100 %       24 Hour I/O Current Shift I/O  Time Ins Outs 04/29 0701 - 04/30 0700 In: 240 [P.O.:240] Out: 200 [Urine:200] No intake/output data recorded.   Patient Vitals for the past 24 hrs:  BP Temp Temp src Pulse Resp SpO2  09/01/20 0604 103/61 98.5 F (36.9 C) Oral 68 16 100 %  08/31/20 2357 (!) 141/89 98.7 F (37.1 C) Oral 73 18 100 %  08/31/20 1942 (!) 147/90 99.3 F (37.4 C) Oral 79 16 100 %  08/31/20 1941 (!) 162/103 -- -- 84 -- --  08/31/20 1530 (!) 152/97 98 F (36.7 C) Oral (!) 102 18 100 %  08/31/20 1112 (!) 145/94 98.2 F (36.8 C) Oral 88 16 100 %   FHT: 120 baseline, +accels, no decel, mod variability Tocometry: quiet  Physical exam: General: Well nourished, well developed female in no acute  distress. Abdomen: gravid nttp Respiratory: no respiratory distress Extremities: no clubbing, cyanosis or edema Skin: Warm and dry.   Medications: Current Facility-Administered Medications  Medication Dose Route Frequency Provider Last Rate Last Admin  . acetaminophen (TYLENOL) tablet 650 mg  650 mg Oral Q4H PRN Janyth Pupa, DO   650 mg at 08/30/20 0746  . aspirin EC tablet 81 mg  81 mg Oral Daily Aletha Halim, MD   81 mg at 08/31/20 1015  . calcium carbonate (TUMS - dosed in mg elemental calcium) chewable tablet 400 mg of elemental calcium  2 tablet Oral Q4H PRN Janyth Pupa, DO   400 mg of elemental calcium at 08/31/20 0234  . docusate sodium (COLACE) capsule 100 mg  100 mg Oral Daily Janyth Pupa, DO   100 mg at 08/30/20 0957  . labetalol (NORMODYNE) injection 20 mg  20 mg Intravenous PRN Janyth Pupa, DO       And  . labetalol (NORMODYNE) injection 40 mg  40 mg Intravenous PRN Janyth Pupa, DO       And  . labetalol (NORMODYNE) injection 80 mg  80 mg Intravenous PRN Janyth Pupa, DO       And  . hydrALAZINE (APRESOLINE) injection 10 mg  10 mg Intravenous PRN Janyth Pupa, DO      . lactated ringers infusion   Intravenous Continuous Arrie Senate, MD   Stopped at 08/30/20 2251  . NIFEdipine (PROCARDIA-XL/NIFEDICAL-XL) 24 hr tablet 30 mg  30 mg Oral BID Aletha Halim, MD   30 mg at 08/31/20 2129  . prenatal multivitamin tablet 1 tablet  1 tablet Oral Q1200 Janyth Pupa, DO   1 tablet at 08/30/20 0957  . zolpidem (AMBIEN) tablet 5 mg  5 mg Oral QHS PRN Janyth Pupa, DO        Labs:  Recent Labs  Lab 08/29/20 2121 08/30/20 0646  WBC 8.1 8.1  HGB 11.1* 12.1  HCT 32.3* 35.6*  PLT 210 225    Recent Labs  Lab 08/29/20 2121 08/30/20 0646  NA 135 134*  K 3.5 3.9  CL 107 106  CO2 22 21*  BUN 6 <5*  CREATININE 0.69 0.69  CALCIUM 8.5* 7.9*  PROT 5.6* 5.5*  BILITOT 0.8 0.6  ALKPHOS 89 104  ALT 13 14  AST 20 21  GLUCOSE 85 121*   08/30/20 24  hr protein 943 mg  Radiology:  4/28: efw 53%, 3086VH, ac 84%, cephalic, bpp 8/8, afi 13  Assessment & Plan:  Pt doing well *Pregnancy: reactive NST *Severe Preeclampsia: continue Procardia XL 30 bid.  *Preterm: s/p bmz on 4/27 and 4/28. Consult NICU prn *PPx: SCDs, OOB ad lib *FEN/GI: SLIV, regular diet *Dispo: she has severe preeclampsia. Recommendation is usually to remain in house until delivery. Reiterated risks of leaving AMA with patient, Dr. Ilda Basset had long conversation with her on 08/31/20.  Verita Schneiders, MD [facility] (Faculty Practice) GYN Consult Phone: [phone] (M-F, 0800-1700) & [phone] (Off hours, weekends, holidays)

## 2020-09-02 ENCOUNTER — Encounter (HOSPITAL_COMMUNITY): Payer: Self-pay | Admitting: Obstetrics and Gynecology

## 2020-09-02 ENCOUNTER — Other Ambulatory Visit: Payer: Self-pay

## 2020-09-02 ENCOUNTER — Inpatient Hospital Stay (HOSPITAL_COMMUNITY)
Admission: AD | Admit: 2020-09-02 | Discharge: 2020-09-23 | Disposition: A | Discharge status: Home / Self Care | Payer: BC Managed Care – PPO | Source: Home / Self Care | Attending: Obstetrics and Gynecology | Admitting: Obstetrics and Gynecology

## 2020-09-02 DIAGNOSIS — O1413 Severe pre-eclampsia, third trimester: Secondary | ICD-10-CM | POA: Diagnosis not present

## 2020-09-02 DIAGNOSIS — Z3A31 31 weeks gestation of pregnancy: Secondary | ICD-10-CM | POA: Diagnosis not present

## 2020-09-02 DIAGNOSIS — O0990 Supervision of high risk pregnancy, unspecified, unspecified trimester: Secondary | ICD-10-CM

## 2020-09-02 DIAGNOSIS — Z6791 Unspecified blood type, Rh negative: Secondary | ICD-10-CM

## 2020-09-02 DIAGNOSIS — O1414 Severe pre-eclampsia complicating childbirth: Secondary | ICD-10-CM | POA: Diagnosis present

## 2020-09-02 DIAGNOSIS — O1410 Severe pre-eclampsia, unspecified trimester: Secondary | ICD-10-CM

## 2020-09-02 DIAGNOSIS — O26899 Other specified pregnancy related conditions, unspecified trimester: Secondary | ICD-10-CM

## 2020-09-02 DIAGNOSIS — Z98891 History of uterine scar from previous surgery: Secondary | ICD-10-CM

## 2020-09-02 DIAGNOSIS — Z3490 Encounter for supervision of normal pregnancy, unspecified, unspecified trimester: Secondary | ICD-10-CM

## 2020-09-02 DIAGNOSIS — Z3A32 32 weeks gestation of pregnancy: Secondary | ICD-10-CM

## 2020-09-02 LAB — COMPREHENSIVE METABOLIC PANEL
ALT: 17 U/L (ref 0–44)
AST: 27 U/L (ref 15–41)
Albumin: 2.4 g/dL — ABNORMAL LOW (ref 3.5–5.0)
Alkaline Phosphatase: 87 U/L (ref 38–126)
Anion gap: 8 (ref 5–15)
BUN: 10 mg/dL (ref 6–20)
CO2: 22 mmol/L (ref 22–32)
Calcium: 8.3 mg/dL — ABNORMAL LOW (ref 8.9–10.3)
Chloride: 107 mmol/L (ref 98–111)
Creatinine, Ser: 0.73 mg/dL (ref 0.44–1.00)
GFR, Estimated: >60 mL/min (ref >60)
Glucose, Bld: 70 mg/dL (ref 70–99)
Potassium: 3.5 mmol/L (ref 3.5–5.1)
Sodium: 137 mmol/L (ref 135–145)
Total Bilirubin: 1 mg/dL (ref 0.3–1.2)
Total Protein: 5.4 g/dL — ABNORMAL LOW (ref 6.5–8.1)

## 2020-09-02 LAB — CBC
HCT: 33.3 % — ABNORMAL LOW (ref 36.0–46.0)
Hemoglobin: 11.5 g/dL — ABNORMAL LOW (ref 12.0–15.0)
MCH: 33 pg (ref 26.0–34.0)
MCHC: 34.5 g/dL (ref 30.0–36.0)
MCV: 95.4 fL (ref 80.0–100.0)
Platelets: 243 10*3/uL (ref 150–400)
RBC: 3.49 MIL/uL — ABNORMAL LOW (ref 3.87–5.11)
RDW: 13.1 % (ref 11.5–15.5)
WBC: 13.3 10*3/uL — ABNORMAL HIGH (ref 4.0–10.5)
nRBC: 0.5 % — ABNORMAL HIGH (ref 0.0–0.2)

## 2020-09-02 LAB — TYPE AND SCREEN
ABO/RH(D): AB NEG
Antibody Screen: POSITIVE

## 2020-09-02 LAB — SARS CORONAVIRUS 2 (TAT 6-24 HRS): SARS Coronavirus 2: NEGATIVE

## 2020-09-02 LAB — PROTEIN / CREATININE RATIO, URINE
Creatinine, Urine: 117.28 mg/dL
Protein Creatinine Ratio: 2.92 mg/mg{Cre} — ABNORMAL HIGH (ref 0.00–0.15)
Total Protein, Urine: 342 mg/dL

## 2020-09-02 MED ORDER — DOCUSATE SODIUM 100 MG PO CAPS: 100.0000 mg | ORAL_CAPSULE | Freq: Two times a day (BID) | ORAL | Status: DC | PRN

## 2020-09-02 MED ORDER — LABETALOL HCL 5 MG/ML IV SOLN
20.0000 mg | INTRAVENOUS | Status: DC | PRN
  Administered 2020-09-02: 20 mg via INTRAVENOUS
  Filled 2020-09-02 (×2): qty 4

## 2020-09-02 MED ORDER — NIFEDIPINE ER OSMOTIC RELEASE 30 MG PO TB24
30.0000 mg | ORAL_TABLET | Freq: Two times a day (BID) | ORAL | Status: DC
  Administered 2020-09-02 (×2): 30 mg via ORAL
  Filled 2020-09-02 (×2): qty 1

## 2020-09-02 MED ORDER — ACETAMINOPHEN 325 MG PO TABS
650.0000 mg | ORAL_TABLET | ORAL | Status: DC | PRN
  Administered 2020-09-10 – 2020-09-17 (×3): 650 mg via ORAL
  Filled 2020-09-02 (×4): qty 2

## 2020-09-02 MED ORDER — HYDRALAZINE HCL 20 MG/ML IJ SOLN: 10.0000 mg | INTRAMUSCULAR | Status: DC | PRN

## 2020-09-02 MED ORDER — MAGNESIUM SULFATE 40 GM/1000ML IV SOLN: 2.0000 g/h | INTRAVENOUS | Status: DC

## 2020-09-02 MED ORDER — LABETALOL HCL 5 MG/ML IV SOLN: 40.0000 mg | INTRAVENOUS | Status: DC | PRN

## 2020-09-02 MED ORDER — ASPIRIN 81 MG PO TBEC: 81.0000 mg | DELAYED_RELEASE_TABLET | Freq: Every day | ORAL | Status: DC

## 2020-09-02 MED ORDER — MAGNESIUM SULFATE BOLUS VIA INFUSION
4.0000 g | Freq: Once | INTRAVENOUS | Status: AC
  Administered 2020-09-02: 4 g via INTRAVENOUS
  Filled 2020-09-02: qty 1000

## 2020-09-02 MED ORDER — LACTATED RINGERS IV SOLN: INTRAVENOUS | Status: AC

## 2020-09-02 MED ORDER — MAGNESIUM SULFATE 40 GM/1000ML IV SOLN
2.0000 g/h | INTRAVENOUS | Status: AC
  Administered 2020-09-03: 2 g/h via INTRAVENOUS
  Filled 2020-09-02 (×2): qty 1000

## 2020-09-02 MED ORDER — PRENATAL MULTIVITAMIN CH
1.0000 | ORAL_TABLET | Freq: Every day | ORAL | Status: DC
  Administered 2020-09-02: 1 via ORAL
  Filled 2020-09-02 (×2): qty 1

## 2020-09-02 MED ORDER — LABETALOL HCL 5 MG/ML IV SOLN: 80.0000 mg | INTRAVENOUS | Status: DC | PRN

## 2020-09-02 MED ORDER — ASPIRIN EC 81 MG PO TBEC
81.0000 mg | DELAYED_RELEASE_TABLET | Freq: Every day | ORAL | Status: DC
  Administered 2020-09-02 – 2020-09-20 (×19): 81 mg via ORAL
  Filled 2020-09-02 (×19): qty 1

## 2020-09-02 MED ORDER — CALCIUM CARBONATE ANTACID 500 MG PO CHEW
2.0000 | CHEWABLE_TABLET | ORAL | Status: DC | PRN
  Administered 2020-09-03: 400 mg via ORAL
  Filled 2020-09-02: qty 2

## 2020-09-02 NOTE — H&P (Signed)
History     CSN: [id number]  Arrival date and time: 09/02/20 1249  Chief Complaint  Patient presents with  . Hypertension   HPI Kelly Lane is a 25 y.o. G2P0010 at [redacted]w[redacted]d who presents stating her blood pressure was elevated at home. She left AMA from [facility] last night because she states she was overwhelmed and didn't want to stay. She states she did not take her medication at home and now has "little black dots" in her vision. She denies HA or visual changes. She denies any abdominal pain, vaginal bleeding or leaking. Reports normal fetal movement. She verbalized understanding that she needs to be admitted.   OB History    Gravida  2   Para  0   Term  0   Preterm  0   AB  1   Living  0     SAB  0   IAB  1   Ectopic  0   Multiple  1   Live Births  0           Past Medical History:  Diagnosis Date  . Anxiety   . Endometriosis 07/2015  . GERD (gastroesophageal reflux disease)    NO MEDS  . Headache   . Ovarian cyst 07/2015    Past Surgical History:  Procedure Laterality Date  . LAPAROSCOPIC OVARIAN CYSTECTOMY Right 07/17/2015   Procedure: EXCISION OF ENDOMETRIOSIS;  Surgeon: Gae Dry, MD;  Location: [facility] ORS;  Service: Gynecology;  Laterality: Right;  . LAPAROSCOPY N/A 07/17/2015   Procedure: LAPAROSCOPY OPERATIVE;  Surgeon: Gae Dry, MD;  Location: [facility] ORS;  Service: Gynecology;  Laterality: N/A;    Family History  Problem Relation Age of Onset  . Hypertension Mother     Social History   Tobacco Use  . Smoking status: Never Smoker  . Smokeless tobacco: Never Used  Vaping Use  . Vaping Use: Never used  Substance Use Topics  . Alcohol use: Not Currently    Comment: every six months maybe 1 drink  . Drug use: No    Allergies: No Known Allergies  Medications Prior to Admission  Medication Sig Dispense Refill Last Dose  . aspirin EC 81 MG EC tablet Take 1 tablet (81 mg total) by mouth daily. Swallow whole. 30 tablet 11   .  cetirizine (ZYRTEC) 10 MG tablet Take 1 tablet (10 mg total) by mouth daily. (Patient not taking: No sig reported) 30 tablet 4   . cyclobenzaprine (FLEXERIL) 5 MG tablet Take 1 tablet (5 mg total) by mouth at bedtime as needed for muscle spasms. (Patient not taking: No sig reported) 20 tablet 0   . NIFEdipine (ADALAT CC) 30 MG 24 hr tablet Take 1 tablet (30 mg total) by mouth 2 (two) times daily. 60 tablet 2   . ondansetron (ZOFRAN ODT) 4 MG disintegrating tablet Take 1 tablet (4 mg total) by mouth every 8 (eight) hours as needed for nausea or vomiting. (Patient not taking: No sig reported) 30 tablet 1   . Prenatal Vit-Fe Phos-FA-Omega (VITAFOL GUMMIES) 3.33-0.333-34.8 MG CHEW Chew 3 tablets by mouth daily before breakfast. (Patient not taking: Reported on 08/27/2020) 90 tablet 11     Review of Systems  Constitutional: Negative.  Negative for fatigue and fever.  HENT: Negative.   Respiratory: Negative.  Negative for shortness of breath.   Cardiovascular: Negative.  Negative for chest pain.  Gastrointestinal: Negative.  Negative for abdominal pain, constipation, diarrhea, nausea and vomiting.  Genitourinary: Negative.  Negative for dysuria, vaginal bleeding and vaginal discharge.  Neurological: Negative.  Negative for dizziness and headaches.   Physical Exam   Blood pressure (!) 153/110, pulse 73, temperature 98.7 F (37.1 C), temperature source Oral, resp. rate 18, last menstrual period 01/28/2020, SpO2 99 %, unknown if currently breastfeeding.  Physical Exam Vitals and nursing note reviewed.  Constitutional:      General: She is not in acute distress.    Appearance: She is well-developed.  HENT:     Head: Normocephalic.  Eyes:     Pupils: Pupils are equal, round, and reactive to light.  Cardiovascular:     Rate and Rhythm: Normal rate and regular rhythm.     Heart sounds: Normal heart sounds.  Pulmonary:     Effort: Pulmonary effort is normal. No respiratory distress.     Breath  sounds: Normal breath sounds.  Abdominal:     General: Bowel sounds are normal. There is no distension.     Palpations: Abdomen is soft.     Tenderness: There is no abdominal tenderness.  Skin:    General: Skin is warm and dry.  Neurological:     Mental Status: She is alert and oriented to person, place, and time.  Psychiatric:        Behavior: Behavior normal.        Thought Content: Thought content normal.        Judgment: Judgment normal.     MAU Course  Procedures  MDM Dr. Ilda Basset notified of patient arrival in MAU after leaving AMA yesterday. Will admit to [facility]  Preeclampsia protocol placed Magnesium ordered  Assessment and Plan  -Severe preeclampsia -[redacted] weeks gestation.  -Admit to [place] turned over to MD  [place] 09/02/2020, 1:31 PM

## 2020-09-02 NOTE — MAU Note (Signed)
Pt reports to mau with c/o elevated bp. Denies ctx or lof. +FM Denies Headache. BP 153/110 in triage

## 2020-09-03 DIAGNOSIS — Z3A31 31 weeks gestation of pregnancy: Secondary | ICD-10-CM | POA: Diagnosis not present

## 2020-09-03 DIAGNOSIS — O1413 Severe pre-eclampsia, third trimester: Secondary | ICD-10-CM | POA: Diagnosis not present

## 2020-09-03 LAB — AMNISURE RUPTURE OF MEMBRANE (ROM) NOT AT ARMC: Amnisure ROM: NEGATIVE

## 2020-09-03 MED ORDER — NIFEDIPINE ER OSMOTIC RELEASE 60 MG PO TB24
60.0000 mg | ORAL_TABLET | Freq: Two times a day (BID) | ORAL | Status: DC
  Administered 2020-09-03 – 2020-09-23 (×41): 60 mg via ORAL
  Filled 2020-09-03 (×5): qty 2
  Filled 2020-09-03: qty 1
  Filled 2020-09-03: qty 2
  Filled 2020-09-03: qty 1
  Filled 2020-09-03 (×6): qty 2
  Filled 2020-09-03: qty 1
  Filled 2020-09-03 (×15): qty 2
  Filled 2020-09-03: qty 1
  Filled 2020-09-03 (×4): qty 2
  Filled 2020-09-03: qty 1
  Filled 2020-09-03: qty 2
  Filled 2020-09-03: qty 1
  Filled 2020-09-03 (×2): qty 2
  Filled 2020-09-03: qty 1
  Filled 2020-09-03 (×2): qty 2

## 2020-09-03 MED ORDER — COMPLETENATE 29-1 MG PO CHEW
1.0000 | CHEWABLE_TABLET | Freq: Every day | ORAL | Status: DC
  Administered 2020-09-03 – 2020-09-20 (×18): 1 via ORAL
  Filled 2020-09-03 (×17): qty 1

## 2020-09-03 NOTE — Progress Notes (Addendum)
Daily Antepartum Note  Admission Date: 09/02/2020 Current Date: 09/03/2020 7:34 AM  Kelly Lane is a 25 y.o. G2P0010 @ [redacted]w[redacted]d, HD#2, admitted for severe pre-eclampsia (BPs, protein, neuro s/s). Patient was previously here but left AMA on 1/61  Pregnancy complicated by:  Patient Active Problem List   Diagnosis Date Noted  . Severe pre-eclampsia 09/02/2020  . Pregnancy with adoption planned 08/31/2020  . [redacted] weeks gestation of pregnancy 08/31/2020  . COVID-19 affecting pregnancy in first trimester 08/30/2020  . Severe preeclampsia, third trimester 08/29/2020  . Back pain in pregnancy 07/06/2020  . Rh negative state in antepartum period 06/07/2020  . Supervision of high-risk pregnancy 05/24/2020    Overnight/24hr events:  None Subjective:  Patient resting comfortably  Objective:    Current Vital Signs 24h Vital Sign Ranges  T 98.2 F (36.8 C) Temp  Avg: 98.3 F (36.8 C)  Min: 98 F (36.7 C)  Max: 98.7 F (37.1 C)  BP (!) 130/94 BP  Min: 129/76  Max: 159/107  HR 87 Pulse  Avg: 80.3  Min: 66  Max: 89  RR 17 Resp  Avg: 17.5  Min: 16  Max: 18  SaO2 97 %  (room air) SpO2  Avg: 98.6 %  Min: 96 %  Max: 100 %       24 Hour I/O Current Shift I/O  Time Ins Outs 05/01 0701 - 05/02 0700 In: 1906.6 [P.O.:420; I.V.:1486.6] Out: 1675 [Urine:1675] No intake/output data recorded.   Patient Vitals for the past 24 hrs:  BP Temp Temp src Pulse Resp SpO2  09/03/20 0658 -- -- -- -- 17 --  09/03/20 0600 -- -- -- -- 18 --  09/03/20 0500 -- -- -- -- 18 --  09/03/20 0400 -- -- -- -- 17 --  09/03/20 0320 (!) 130/94 98.2 F (36.8 C) Oral 87 18 97 %  09/03/20 0300 -- -- -- -- 17 --  09/03/20 0150 -- -- -- -- 18 --  09/03/20 0100 -- -- -- -- 16 --  09/03/20 0000 (!) 148/105 -- -- 87 17 100 %  09/02/20 2300 (!) 145/107 98.1 F (36.7 C) Oral 86 18 98 %  09/02/20 2200 (!) 145/97 -- -- 79 18 --  09/02/20 2118 (!) 152/107 -- -- 81 -- --  09/02/20 2100 (!) 157/114 98.2 F (36.8 C) Oral 89 18 99 %   09/02/20 2000 (!) 142/101 -- -- 86 -- --  09/02/20 1900 (!) 144/87 98.4 F (36.9 C) Oral 85 18 99 %  09/02/20 1800 129/76 -- -- 87 17 99 %  09/02/20 1700 134/87 98 F (36.7 C) Oral 89 18 99 %  09/02/20 1600 (!) 143/98 -- -- 83 -- 99 %  09/02/20 1455 -- -- -- -- -- 99 %  09/02/20 1450 -- -- -- -- -- 99 %  09/02/20 1445 -- -- -- -- -- 100 %  09/02/20 1440 -- -- -- -- -- 99 %  09/02/20 1415 -- -- -- -- -- 100 %  09/02/20 1411 (!) 150/105 -- -- 77 -- --  09/02/20 1410 -- -- -- -- -- 99 %  09/02/20 1405 -- -- -- -- -- 96 %  09/02/20 1401 (!) 159/107 98.7 F (37.1 C) Oral 76 17 --  09/02/20 1400 -- -- -- -- -- 98 %  09/02/20 1357 (!) 152/109 -- -- 75 -- --  09/02/20 1355 -- -- -- -- -- 98 %  09/02/20 1351 (!) 156/100 -- -- 73 -- --  09/02/20 1350 -- -- -- -- --  99 %  09/02/20 1346 (!) 149/107 -- -- 66 -- --  09/02/20 1345 -- -- -- -- -- 98 %  09/02/20 1340 -- -- -- -- -- 98 %  09/02/20 1335 -- -- -- -- -- 99 %  09/02/20 1331 (!) 154/110 -- -- 70 -- --  09/02/20 1330 -- -- -- -- -- 98 %  09/02/20 1326 (!) 153/110 98.7 F (37.1 C) Oral 73 18 99 %  09/02/20 1325 -- -- -- -- -- 98 %  09/02/20 1320 (!) 153/110 -- -- 77 -- 98 %   FHT: 125 baseline, +accels, no decel, mod variability Tocometry: quiet  UOP: >137mL/hr  Physical exam: General: Well nourished, well developed female in no acute distress. Abdomen: gravid nttp Respiratory: no respiratory distress Extremities: no clubbing, cyanosis or edema Skin: Warm and dry.   Medications: Current Facility-Administered Medications  Medication Dose Route Frequency Provider Last Rate Last Admin  . acetaminophen (TYLENOL) tablet 650 mg  650 mg Oral Q4H PRN Aletha Halim, MD      . aspirin EC tablet 81 mg  81 mg Oral Daily Aletha Halim, MD   81 mg at 09/02/20 1622  . calcium carbonate (TUMS - dosed in mg elemental calcium) chewable tablet 400 mg of elemental calcium  2 tablet Oral Q4H PRN Aletha Halim, MD   400 mg of elemental  calcium at 09/03/20 0224  . docusate sodium (COLACE) capsule 100 mg  100 mg Oral BID PRN Aletha Halim, MD      . labetalol (NORMODYNE) injection 20 mg  20 mg Intravenous PRN Aletha Halim, MD   20 mg at 09/02/20 1343   And  . labetalol (NORMODYNE) injection 40 mg  40 mg Intravenous PRN Aletha Halim, MD       And  . labetalol (NORMODYNE) injection 80 mg  80 mg Intravenous PRN Aletha Halim, MD       And  . hydrALAZINE (APRESOLINE) injection 10 mg  10 mg Intravenous PRN Aletha Halim, MD      . lactated ringers infusion   Intravenous Continuous Aletha Halim, MD 75 mL/hr at 09/03/20 0321 New Bag at 09/03/20 0321  . magnesium sulfate 40 grams in SWI 1000 mL OB infusion  2 g/hr Intravenous Continuous Aletha Halim, MD 50 mL/hr at 09/02/20 1522 2 g/hr at 09/02/20 1522  . NIFEdipine (PROCARDIA-XL/NIFEDICAL-XL) 24 hr tablet 60 mg  60 mg Oral BID Aletha Halim, MD      . prenatal multivitamin tablet 1 tablet  1 tablet Oral Q1200 Aletha Halim, MD   1 tablet at 09/02/20 1622    Labs:  Recent Labs  Lab 08/29/20 2121 08/30/20 0646 09/02/20 1336  WBC 8.1 8.1 13.3*  HGB 11.1* 12.1 11.5*  HCT 32.3* 35.6* 33.3*  PLT 210 225 243    Recent Labs  Lab 08/29/20 2121 08/30/20 0646 09/02/20 1336  NA 135 134* 137  K 3.5 3.9 3.5  CL 107 106 107  CO2 22 21* 22  BUN 6 <5* 10  CREATININE 0.69 0.69 0.73  CALCIUM 8.5* 7.9* 8.3*  PROT 5.6* 5.5* 5.4*  BILITOT 0.8 0.6 1.0  ALKPHOS 89 104 87  ALT 13 14 17   AST 20 21 27   GLUCOSE 85 121* 70     Radiology:  4/28: efw 53%, 5638VF, ac 64%, cephalic, bpp 8/8, afi 13  Assessment & Plan:  Pt doing well *Pregnancy: category I with accels *Severe pre-eclampsia: Continue Mg x 24h (until early afternoon today). procardia increased from 30 bid  to 60 bid. Rpt bpp and labs later this week. *Preterm: s/p bmz on 4/27 and 4/28. S/p NICU consult *Social: plan is for baby up for adoption. SW consult PRN and PP *Rh neg: rhogam UTD.  Rpt pp *History of depression: no current issues.  *PPx: SCDs, OOB ad lib *FEN/GI: MIVF on Mg, regular diet *Dispo: inpatient until delivery. Try to get to 34wks  Durene Romans MD Attending Center for [facility]) GYN Consult Phone: [phone] (M-F, 0800-1700) & [phone] (Off hours, weekends, holidays)

## 2020-09-03 NOTE — Progress Notes (Signed)
Pt reported gush of white to clear fluid. No contractions, good fetal movement.  Amnisure completed- negative.  Reassured pt that she is not ruptured.  Reviewed labor/LOF precautions.  Kelly Pupa, DO Attending [place], [facility] for [employer], [place]

## 2020-09-04 NOTE — Progress Notes (Signed)
[place]) NOTE  Kelly Lane is a 25 y.o. G2P0010 with Estimated Date of Delivery: 11/03/20   By  best clinical estimate [redacted]w[redacted]d  who is admitted for preeclampsia with severe features.    Fetal presentation is cephalic. Length of Stay:  2  Days  Date of admission:09/02/2020  Subjective: No acute complaints overnight- denies HA, blurry vision or RUQ pain.  Denies F/C/CP/SOB.   Patient reports the fetal movement as active. Patient reports uterine contraction  activity as none. Patient reports  vaginal bleeding as no further discharge/leaking. Patient describes fluid per vagina as None.  Vitals:  Blood pressure (!) 142/94, pulse 94, temperature 98 F (36.7 C), temperature source Oral, resp. rate 18, last menstrual period 01/28/2020, SpO2 98 %, unknown if currently breastfeeding. Vitals:   09/03/20 1553 09/03/20 2000 09/04/20 0030 09/04/20 0433  BP: (!) 148/100 (!) 144/94 140/90 (!) 142/94  Pulse: 81 84  94  Resp:  18  18  Temp:  98.5 F (36.9 C)  98 F (36.7 C)  TempSrc:  Oral  Oral  SpO2:    98%   Physical Examination:  General appearance - alert, well appearing, and in no distress Mental status - normal mood, behavior, speech, dress, motor activity, and thought processes Chest - clear to auscultation, no wheezes, rales or rhonchi, symmetric air entry Heart - normal rate and regular rhythm Abdomen - gravid, soft and non-tender Extremities - no pedal edema noted, no calf tenderness bilaterally Skin - warm and dry   Fetal Monitoring:  Baseline: 135 bpm, Variability: moderate, Accelerations: 15x15 accels, and Decelerations: Absent    reactive  Labs:  Results for orders placed or performed during the hospital encounter of 09/02/20 (from the past 24 hour(s))  Amnisure rupture of membrane (rom)not at [facility]   Collection Time: 09/03/20  5:45 PM  Result Value Ref Range   Amnisure ROM NEGATIVE     Imaging Studies:    No results found.   Medications:   Scheduled . aspirin EC  81 mg Oral Daily  . NIFEdipine  60 mg Oral BID  . prenatal vitamin w/FE, FA  1 tablet Oral Q1200   I have reviewed the patient's current medications.  ASSESSMENT: G2P0010 [redacted]w[redacted]d Estimated Date of Delivery: 11/03/20  Patient Active Problem List   Diagnosis Date Noted  . Severe pre-eclampsia 09/02/2020  . Pregnancy with adoption planned 08/31/2020  . Back pain in pregnancy 07/06/2020  . Rh negative state in antepartum period 06/07/2020   PLAN: 1) Severe preeclampsia -s/p Magnesium x 24hr -currently on ProcardiaXL 60mg  bid -labs q 72hrs, last completed on 09/02/20- remain stable  2) Fetal well being -Cat I, Reactive NST, continue q shift monitoring -s/p BMZ 4/27-4/28 -BPP weekly, last completed 4/28: efw 53%, 7829FA, ac 21%, cephalic, bpp 8/8, afi 13 -Rh neg, s/p RhoGAM @ 28wks  3) Maternal well being -h/o Depression- no meds currently -Social: plan is for baby up for adoption. SW consult PRN and PP -DVT prophylaxis- SCDs, pt may ambulate  DISP: Continue with inpatient management until delivery @ 34wks or as clinically indicated pending maternal/fetal well being.  Clearnce Sorrel Jamile Rekowski 09/04/2020,5:24 AM

## 2020-09-05 DIAGNOSIS — O1413 Severe pre-eclampsia, third trimester: Secondary | ICD-10-CM | POA: Diagnosis not present

## 2020-09-05 DIAGNOSIS — Z3A31 31 weeks gestation of pregnancy: Secondary | ICD-10-CM | POA: Diagnosis not present

## 2020-09-05 LAB — TYPE AND SCREEN
ABO/RH(D): AB NEG
Antibody Screen: POSITIVE

## 2020-09-05 LAB — CBC
HCT: 31.3 % — ABNORMAL LOW (ref 36.0–46.0)
Hemoglobin: 10.7 g/dL — ABNORMAL LOW (ref 12.0–15.0)
MCH: 33 pg (ref 26.0–34.0)
MCHC: 34.2 g/dL (ref 30.0–36.0)
MCV: 96.6 fL (ref 80.0–100.0)
Platelets: 218 10*3/uL (ref 150–400)
RBC: 3.24 MIL/uL — ABNORMAL LOW (ref 3.87–5.11)
RDW: 13.3 % (ref 11.5–15.5)
WBC: 10.9 10*3/uL — ABNORMAL HIGH (ref 4.0–10.5)
nRBC: 0 % (ref 0.0–0.2)

## 2020-09-05 LAB — COMPREHENSIVE METABOLIC PANEL
ALT: 15 U/L (ref 0–44)
AST: 19 U/L (ref 15–41)
Albumin: 2.2 g/dL — ABNORMAL LOW (ref 3.5–5.0)
Alkaline Phosphatase: 83 U/L (ref 38–126)
Anion gap: 7 (ref 5–15)
BUN: 14 mg/dL (ref 6–20)
CO2: 23 mmol/L (ref 22–32)
Calcium: 8.3 mg/dL — ABNORMAL LOW (ref 8.9–10.3)
Chloride: 108 mmol/L (ref 98–111)
Creatinine, Ser: 0.67 mg/dL (ref 0.44–1.00)
GFR, Estimated: >60 mL/min (ref >60)
Glucose, Bld: 83 mg/dL (ref 70–99)
Potassium: 3.9 mmol/L (ref 3.5–5.1)
Sodium: 138 mmol/L (ref 135–145)
Total Bilirubin: 0.8 mg/dL (ref 0.3–1.2)
Total Protein: 4.9 g/dL — ABNORMAL LOW (ref 6.5–8.1)

## 2020-09-05 NOTE — Progress Notes (Signed)
[place]) NOTE  Kelly Lane is a 25 y.o. G2P0010 with Estimated Date of Delivery: 11/03/20   By  best clinical estimate [redacted]w[redacted]d  who is admitted for preeclampsia with severe features.    Fetal presentation is cephalic. Length of Stay:  3  Days  Date of admission:09/02/2020  Subjective: No acute complaints overnight- denies HA, blurry vision or RUQ pain.  Denies F/C/CP/SOB.   Patient reports the fetal movement as active. Patient reports uterine contraction  activity as none. Patient reports  vaginal bleeding as none Patient describes fluid per vagina as none.  Vitals:  Blood pressure (!) 133/100, pulse 78, temperature 97.8 F (36.6 C), temperature source Oral, resp. rate 16, last menstrual period 01/28/2020, SpO2 99 %, unknown if currently breastfeeding. Vitals:   09/04/20 1608 09/04/20 1948 09/04/20 2334 09/05/20 0505  BP: 135/88 (!) 139/97 (!) 148/96 (!) 133/100  Pulse: 87 84 86 78  Resp: 18 18 18 16   Temp: 98.6 F (37 C) 98.1 F (36.7 C) 98.7 F (37.1 C) 97.8 F (36.6 C)  TempSrc: Oral Oral Oral Oral  SpO2: 100% 100% 99% 99%   Physical Examination: General appearance - alert, well appearing, and in no distress Mental status - normal mood, behavior, speech, dress, motor activity, and thought processes Chest - normal breath sounds Heart - normal rate and regular rhythm Abdomen - gravid, soft and non-tender Extremities - no pedal edema noted, no calf tenderness bilaterally Skin - warm and dry   Fetal Monitoring:  Baseline: 135 bpm, Variability: moderate, Accelerations: 15x15 accels, and Decelerations: Absent  > Reactive, Category I  Labs:  Results for orders placed or performed during the hospital encounter of 09/02/20 (from the past 24 hour(s))  CBC   Collection Time: 09/05/20  5:10 AM  Result Value Ref Range   WBC 10.9 (H) 4.0 - 10.5 K/uL   RBC 3.24 (L) 3.87 - 5.11 MIL/uL   Hemoglobin 10.7 (L) 12.0 - 15.0 g/dL   HCT 31.3 (L) 36.0 - 46.0 %    MCV 96.6 80.0 - 100.0 fL   MCH 33.0 26.0 - 34.0 pg   MCHC 34.2 30.0 - 36.0 g/dL   RDW 13.3 11.5 - 15.5 %   Platelets 218 150 - 400 K/uL   nRBC 0.0 0.0 - 0.2 %  Comprehensive metabolic panel   Collection Time: 09/05/20  5:10 AM  Result Value Ref Range   Sodium 138 135 - 145 mmol/L   Potassium 3.9 3.5 - 5.1 mmol/L   Chloride 108 98 - 111 mmol/L   CO2 23 22 - 32 mmol/L   Glucose, Bld 83 70 - 99 mg/dL   BUN 14 6 - 20 mg/dL   Creatinine, Ser 0.67 0.44 - 1.00 mg/dL   Calcium 8.3 (L) 8.9 - 10.3 mg/dL   Total Protein 4.9 (L) 6.5 - 8.1 g/dL   Albumin 2.2 (L) 3.5 - 5.0 g/dL   AST 19 15 - 41 U/L   ALT 15 0 - 44 U/L   Alkaline Phosphatase 83 38 - 126 U/L   Total Bilirubin 0.8 0.3 - 1.2 mg/dL   GFR, Estimated >60 >60 mL/min   Anion gap 7 5 - 15  Type and screen [place]   Collection Time: 09/05/20  5:10 AM  Result Value Ref Range   ABO/RH(D) AB NEG    Antibody Screen POS    Sample Expiration 09/08/2020,2359    Antibody Identification      PASSIVELY ACQUIRED ANTI-D Performed at  [facility], [place] [address] [place], [place] [address]     Imaging Studies:    No results found.   Medications:  Scheduled . aspirin EC  81 mg Oral Daily  . NIFEdipine  60 mg Oral BID  . prenatal vitamin w/FE, FA  1 tablet Oral Q1200   I have reviewed the patient's current medications.  ASSESSMENT: Patient Active Problem List   Diagnosis Date Noted  . Severe pre-eclampsia 09/02/2020  . Pregnancy with adoption planned 08/31/2020  . Back pain in pregnancy 07/06/2020  . Rh negative state in antepartum period 06/07/2020   PLAN: 1) Severe preeclampsia -s/p Magnesium x 24hr -currently on ProcardiaXL 60mg  bid, BP stable -labs q 72hrs, last completed on 09/05/20- remain stable  2) Fetal well being -Cat I, Reactive NST, continue q shift monitoring -s/p BMZ 4/27-4/28 -BPP weekly, last completed 4/28: efw 53%, 3220UR, ac 42%, cephalic, bpp 8/8, afi 13 -Rh neg, s/p RhoGAM @  28wks  3) Maternal well being -h/o Depression- no meds currently -Social: plan is for baby up for adoption. SW consult PRN and PP -DVT prophylaxis- SCDs, pt may ambulate  DISP: Continue with inpatient management until delivery @ 34wks or as clinically indicated pending maternal/fetal well being.  Verita Schneiders, MD 09/05/2020,11:11 AM

## 2020-09-06 ENCOUNTER — Inpatient Hospital Stay (HOSPITAL_BASED_OUTPATIENT_CLINIC_OR_DEPARTMENT_OTHER): Payer: BC Managed Care – PPO

## 2020-09-06 DIAGNOSIS — O358XX0 Maternal care for other (suspected) fetal abnormality and damage, not applicable or unspecified: Secondary | ICD-10-CM

## 2020-09-06 DIAGNOSIS — D259 Leiomyoma of uterus, unspecified: Secondary | ICD-10-CM | POA: Diagnosis not present

## 2020-09-06 DIAGNOSIS — Z3A31 31 weeks gestation of pregnancy: Secondary | ICD-10-CM | POA: Diagnosis not present

## 2020-09-06 DIAGNOSIS — O1413 Severe pre-eclampsia, third trimester: Secondary | ICD-10-CM | POA: Diagnosis not present

## 2020-09-06 DIAGNOSIS — O360130 Maternal care for anti-D [Rh] antibodies, third trimester, not applicable or unspecified: Secondary | ICD-10-CM

## 2020-09-06 DIAGNOSIS — O99353 Diseases of the nervous system complicating pregnancy, third trimester: Secondary | ICD-10-CM

## 2020-09-06 DIAGNOSIS — O3413 Maternal care for benign tumor of corpus uteri, third trimester: Secondary | ICD-10-CM | POA: Diagnosis not present

## 2020-09-06 MED ORDER — SODIUM CHLORIDE 0.9 % IV SOLN
25.0000 mg | Freq: Once | INTRAVENOUS | Status: AC
  Administered 2020-09-06: 25 mg via INTRAVENOUS
  Filled 2020-09-06: qty 1

## 2020-09-06 NOTE — Progress Notes (Signed)
[place]) NOTE  Kelly Lane is a 25 y.o. G2P0010 with Estimated Date of Delivery: 11/03/20   By  best clinical estimate [redacted]w[redacted]d  who is admitted for preeclampsia with severe features.    Fetal presentation is cephalic. Length of Stay:  4  Days  Date of admission:09/02/2020  Subjective: No acute complaints overnight. Patient denies any headaches, visual symptoms, RUQ/epigastric pain or other concerning symptoms. Patient reports the fetal movement as active. Patient reports uterine contraction  activity as none. Patient reports  vaginal bleeding as none Patient describes fluid per vagina as none.  Vitals:  Blood pressure (!) 138/91, pulse 87, temperature 98.2 F (36.8 C), temperature source Oral, resp. rate 18, last menstrual period 01/28/2020, SpO2 100 %, unknown if currently breastfeeding. Vitals:   09/05/20 2125 09/06/20 0025 09/06/20 0407 09/06/20 0807  BP: (!) 149/96 (!) 147/99 (!) 145/93 (!) 138/91  Pulse: 81 86 84 87  Resp: 18 18 18 18   Temp: 98.5 F (36.9 C) 98.5 F (36.9 C) 98.4 F (36.9 C) 98.2 F (36.8 C)  TempSrc: Oral Oral Oral Oral  SpO2: 100% 100% 100% 100%   Physical Examination: General appearance - alert, well appearing, and in no distress Mental status - normal mood, behavior, speech, dress, motor activity, and thought processes Chest - normal breath sounds Heart - normal rate and regular rhythm Abdomen - gravid, soft and non-tender Extremities - no pedal edema noted, no calf tenderness bilaterally Skin - warm and dry   Fetal Monitoring:  Baseline: 140 bpm, Variability: moderate, Accelerations: 15x15 accels, and Decelerations: Absent  > Reactive, Category I  Labs:  No results found for this or any previous visit (from the past 24 hour(s)).  Imaging Studies:    No results found.  09/06/20 BPP done today, awaiting report  Medications:  Scheduled . aspirin EC  81 mg Oral Daily  . NIFEdipine  60 mg Oral BID  . prenatal vitamin  w/FE, FA  1 tablet Oral Q1200   I have reviewed the patient's current medications.  ASSESSMENT: Patient Active Problem List   Diagnosis Date Noted  . Severe pre-eclampsia 09/02/2020  . Pregnancy with adoption planned 08/31/2020  . Back pain in pregnancy 07/06/2020  . Rh negative state in antepartum period 06/07/2020   PLAN: 1) Severe preeclampsia -s/p Magnesium x 24hr -currently on ProcardiaXL 60mg  bid, BP stable -labs q 72hrs, last completed on 09/05/20- remain stable  2) Fetal well being -Cat I, Reactive NST, continue q shift monitoring -s/p BMZ 4/27-4/28 and s/p NICU consult -BPP weekly, last completed 5/5, report pending    - 4/28: efw 53%, [id number], ac 67%, cephalic, afi 13  3) Maternal well being -h/o Depression- no meds currently -Normal 2 hr GTT on 08/09/2020 -Rh neg, s/p RhoGAM @ 28wks -Social: plan is for baby up for adoption. SW consult PRN and PP -DVT prophylaxis- SCDs, pt may ambulate  DISPO: Continue with inpatient management until delivery @ 34wks or as clinically indicated pending maternal/fetal well being.   Verita Schneiders, MD 09/06/2020,11:04 AM

## 2020-09-07 DIAGNOSIS — O1413 Severe pre-eclampsia, third trimester: Secondary | ICD-10-CM | POA: Diagnosis not present

## 2020-09-07 DIAGNOSIS — Z3A31 31 weeks gestation of pregnancy: Secondary | ICD-10-CM | POA: Diagnosis not present

## 2020-09-07 NOTE — Progress Notes (Signed)
Patient ID: [name], female   DOB: Oct 01, 1995, 25 y.o.   MRN: [id number] [place] COMPREHENSIVE PROGRESS NOTE  DENISE WASHBURN is a 25 y.o. G2P0010 at [redacted]w[redacted]d  who is admitted for [facility].   Fetal presentation is cephalic. Length of Stay:  5  Days  Subjective: Pt denies HA or visual changes this morning. Patient reports good fetal movement.  She reports no uterine contractions, no bleeding and no loss of fluid per vagina.  Vitals:  Blood pressure (!) 152/99, pulse 78, temperature 98.7 F (37.1 C), temperature source Oral, resp. rate 17, last menstrual period 01/28/2020, SpO2 99 %, unknown if currently breastfeeding.   Physical Examination: Lungs clear Heart RRR Abd soft + BS gravid non tender Ext non tender  Fetal Monitoring:  130-140's, + accels, no decels, no ut ctx  Labs:  No results found for this or any previous visit (from the past 24 hour(s)).  Imaging Studies:    NA   Medications:  Scheduled . aspirin EC  81 mg Oral Daily  . NIFEdipine  60 mg Oral BID  . prenatal vitamin w/FE, FA  1 tablet Oral Q1200   I have reviewed the patient's current medications.  ASSESSMENT: IUP 31 6/7 weeks SPEC, S/P BMZ Rh neg, S/P Rhogam  BP stable on Procardia Bid.Fetal well being, continues to be reassuring. Continue with daily NST. Delivery at 34 weeks or per maternal/fetal indications. Continue routine antenatal care.   Chancy Milroy 09/07/2020,7:49 AM

## 2020-09-08 DIAGNOSIS — O1413 Severe pre-eclampsia, third trimester: Secondary | ICD-10-CM | POA: Diagnosis not present

## 2020-09-08 DIAGNOSIS — Z3A32 32 weeks gestation of pregnancy: Secondary | ICD-10-CM

## 2020-09-08 LAB — COMPREHENSIVE METABOLIC PANEL
ALT: 19 U/L (ref 0–44)
AST: 20 U/L (ref 15–41)
Albumin: 2.2 g/dL — ABNORMAL LOW (ref 3.5–5.0)
Alkaline Phosphatase: 85 U/L (ref 38–126)
Anion gap: 5 (ref 5–15)
BUN: 6 mg/dL (ref 6–20)
CO2: 24 mmol/L (ref 22–32)
Calcium: 8.8 mg/dL — ABNORMAL LOW (ref 8.9–10.3)
Chloride: 108 mmol/L (ref 98–111)
Creatinine, Ser: 0.7 mg/dL (ref 0.44–1.00)
GFR, Estimated: >60 mL/min (ref >60)
Glucose, Bld: 76 mg/dL (ref 70–99)
Potassium: 3.9 mmol/L (ref 3.5–5.1)
Sodium: 137 mmol/L (ref 135–145)
Total Bilirubin: 0.5 mg/dL (ref 0.3–1.2)
Total Protein: 5 g/dL — ABNORMAL LOW (ref 6.5–8.1)

## 2020-09-08 LAB — TYPE AND SCREEN
ABO/RH(D): AB NEG
Antibody Screen: POSITIVE

## 2020-09-08 LAB — CBC
HCT: 31.2 % — ABNORMAL LOW (ref 36.0–46.0)
Hemoglobin: 11 g/dL — ABNORMAL LOW (ref 12.0–15.0)
MCH: 33.3 pg (ref 26.0–34.0)
MCHC: 35.3 g/dL (ref 30.0–36.0)
MCV: 94.5 fL (ref 80.0–100.0)
Platelets: 205 10*3/uL (ref 150–400)
RBC: 3.3 MIL/uL — ABNORMAL LOW (ref 3.87–5.11)
RDW: 13.1 % (ref 11.5–15.5)
WBC: 10.5 10*3/uL (ref 4.0–10.5)
nRBC: 0 % (ref 0.0–0.2)

## 2020-09-08 NOTE — Progress Notes (Signed)
Daily Antepartum Note  Admission Date: 09/02/2020 Current Date: 09/08/2020 5:21 AM  Kelly Lane is a 25 y.o. G2P0010 @ [redacted]w[redacted]d, admitted for severe pre-eclampsia (BPs, protein, neuro s/s). Patient was previously here but left AMA on 9/62  Pregnancy complicated by:  Patient Active Problem List   Diagnosis Date Noted  . Severe pre-eclampsia 09/02/2020  . Pregnancy with adoption planned 08/31/2020  . [redacted] weeks gestation of pregnancy 08/31/2020  . COVID-19 affecting pregnancy in first trimester 08/30/2020  . Severe preeclampsia, third trimester 08/29/2020  . Back pain in pregnancy 07/06/2020  . Rh negative state in antepartum period 06/07/2020  . Supervision of high-risk pregnancy 05/24/2020    Overnight/24hr events:  None Subjective:  Patient resting comfortably  Objective:    Current Vital Signs 24h Vital Sign Ranges  T 97.8 F (36.6 C) Temp  Avg: 98.1 F (36.7 C)  Min: 97.8 F (36.6 C)  Max: 98.3 F (36.8 C)  BP (!) 147/98 BP  Min: 144/97  Max: 154/101  HR 81 Pulse  Avg: 90  Min: 81  Max: 102  RR 15 Resp  Avg: 16.8  Min: 15  Max: 18  SaO2 97 % Room Air SpO2  Avg: 98.8 %  Min: 97 %  Max: 100 %       24 Hour I/O Current Shift I/O  Time Ins Outs 05/06 0701 - 05/07 0700 In: 240 [P.O.:240] Out: 1400 [Urine:1400] 05/06 1901 - 05/07 0700 In: -  Out: 700 [Urine:700]   Patient Vitals for the past 24 hrs:  BP Temp Temp src Pulse Resp SpO2  09/08/20 0341 (!) 147/98 97.8 F (36.6 C) Oral 81 15 97 %  09/07/20 2320 (!) 154/101 98.1 F (36.7 C) -- 82 16 100 %  09/07/20 2030 (!) 149/105 -- -- 85 -- --  09/07/20 2029 (!) 153/106 -- -- 84 -- --  09/07/20 2015 (!) 154/107 -- -- 87 -- --  09/07/20 1500 (!) 148/97 98.1 F (36.7 C) Oral (!) 102 18 99 %  09/07/20 1203 (!) 147/95 -- -- 99 -- 98 %  09/07/20 0946 (!) 144/97 98.3 F (36.8 C) Oral 100 18 100 %   FHT: 130 baseline, +accels, no decel, mod variability Tocometry: quiet  Physical exam: General: Well nourished, well  developed female in no acute distress. Abdomen: gravid nttp Respiratory: no respiratory distress Extremities: no clubbing, cyanosis or edema Skin: Warm and dry.   Medications: Current Facility-Administered Medications  Medication Dose Route Frequency Provider Last Rate Last Admin  . acetaminophen (TYLENOL) tablet 650 mg  650 mg Oral Q4H PRN Aletha Halim, MD      . aspirin EC tablet 81 mg  81 mg Oral Daily Aletha Halim, MD   81 mg at 09/07/20 0943  . calcium carbonate (TUMS - dosed in mg elemental calcium) chewable tablet 400 mg of elemental calcium  2 tablet Oral Q4H PRN Aletha Halim, MD   400 mg of elemental calcium at 09/03/20 0224  . docusate sodium (COLACE) capsule 100 mg  100 mg Oral BID PRN Aletha Halim, MD      . labetalol (NORMODYNE) injection 20 mg  20 mg Intravenous PRN Aletha Halim, MD   20 mg at 09/02/20 1343   And  . labetalol (NORMODYNE) injection 40 mg  40 mg Intravenous PRN Aletha Halim, MD       And  . labetalol (NORMODYNE) injection 80 mg  80 mg Intravenous PRN Aletha Halim, MD       And  .  hydrALAZINE (APRESOLINE) injection 10 mg  10 mg Intravenous PRN Aletha Halim, MD      . NIFEdipine (PROCARDIA-XL/NIFEDICAL-XL) 24 hr tablet 60 mg  60 mg Oral BID Aletha Halim, MD   60 mg at 09/07/20 2115  . prenatal vitamin w/FE, FA (NATACHEW) chewable tablet 1 tablet  1 tablet Oral Q1200 Anyanwu, Ugonna A, MD   1 tablet at 09/07/20 1200    Labs:  Recent Labs  Lab 09/02/20 1336 09/05/20 0510  WBC 13.3* 10.9*  HGB 11.5* 10.7*  HCT 33.3* 31.3*  PLT 243 218    Recent Labs  Lab 09/02/20 1336 09/05/20 0510  NA 137 138  K 3.5 3.9  CL 107 108  CO2 22 23  BUN 10 14  CREATININE 0.73 0.67  CALCIUM 8.3* 8.3*  PROT 5.4* 4.9*  BILITOT 1.0 0.8  ALKPHOS 87 83  ALT 17 15  AST 27 19  GLUCOSE 70 83     Radiology:  5/5: ceph, afi 11, 8/8 4/28: efw 53%, 7517GY, ac 17%, cephalic, bpp 8/8, afi 13  Assessment & Plan:  Pt doing  well *Pregnancy: reactive NST; continue with daily testing.  *Severe pre-eclampsia: continue current plan of care. qwk bpp and q3d labs *Preterm: s/p bmz on 4/27 and 4/28. S/p NICU consult *Social: plan is for baby up for adoption. SW consult PRN and PP *Rh neg: rhogam UTD. *History of depression: no current issues.  *PPx: SCDs, OOB ad lib *FEN/GI:  regular diet *Dispo: inpatient until delivery. Try to get to 34wks  Durene Romans MD Attending Center for [facility]) GYN Consult Phone: [phone] (M-F, [phone]) & [phone] (Off hours, weekends, holidays)

## 2020-09-09 DIAGNOSIS — Z3A32 32 weeks gestation of pregnancy: Secondary | ICD-10-CM | POA: Diagnosis not present

## 2020-09-09 DIAGNOSIS — O1413 Severe pre-eclampsia, third trimester: Secondary | ICD-10-CM | POA: Diagnosis not present

## 2020-09-09 MED ORDER — COVID-19 MRNA VAC-TRIS(PFIZER) 30 MCG/0.3ML IM SUSP
0.3000 mL | Freq: Once | INTRAMUSCULAR | Status: DC
  Filled 2020-09-09: qty 0.3

## 2020-09-09 MED ORDER — SODIUM CHLORIDE 0.9% FLUSH
3.0000 mL | Freq: Two times a day (BID) | INTRAVENOUS | Status: DC
  Administered 2020-09-09 – 2020-09-20 (×21): 3 mL via INTRAVENOUS

## 2020-09-09 NOTE — Progress Notes (Signed)
Offered fetal monitoring. Patient states " Ill do it later."

## 2020-09-09 NOTE — Progress Notes (Signed)
[facility]) NOTE  RENESHIA ZUCCARO is a 25 y.o. G2P0010 with Estimated Date of Delivery: 11/03/20   By  best clinical estimate [redacted]w[redacted]d  who is admitted for preeclampsia with severe features.    Fetal presentation is cephalic. Length of Stay:  7  Days  Date of admission:09/02/2020  Subjective: No acute complaints overnight. Patient denies any headaches, visual symptoms, RUQ/epigastric pain or other concerning symptoms. Patient reports the fetal movement as active. Patient reports uterine contraction  activity as none. Patient reports  vaginal bleeding as none Patient describes fluid per vagina as none.  Vitals:  Blood pressure (!) 141/95, pulse 90, temperature 98 F (36.7 C), resp. rate 18, last menstrual period 01/28/2020, SpO2 98 %, unknown if currently breastfeeding. Vitals:   09/08/20 2344 09/08/20 2345 09/09/20 0354 09/09/20 0802  BP: (!) 144/106 (!) 145/97 (!) 148/94 (!) 141/95  Pulse: 94 91 (!) 103 90  Resp:  18 18 18   Temp:  97.9 F (36.6 C) 98 F (36.7 C) 98 F (36.7 C)  TempSrc:  Oral Oral   SpO2:  99% 98%    Physical Examination: General appearance - alert, well appearing, and in no distress Mental status - normal mood, behavior, speech, dress, motor activity, and thought processes Chest - normal breath sounds Heart - normal rate and regular rhythm Abdomen - gravid, soft and non-tender Extremities - no pedal edema noted, no calf tenderness bilaterally Skin - warm and dry   Fetal Monitoring:  Baseline: 150 bpm, Variability: moderate, Accelerations: 15x15 accels, and Decelerations: Absent  > Reactive, Category I  Labs:  Results for orders placed or performed during the hospital encounter of 09/02/20 (from the past 72 hour(s))  CBC     Status: Abnormal   Collection Time: 09/08/20  5:31 AM  Result Value Ref Range   WBC 10.5 4.0 - 10.5 K/uL   RBC 3.30 (L) 3.87 - 5.11 MIL/uL   Hemoglobin 11.0 (L) 12.0 - 15.0 g/dL   HCT 31.2 (L) 36.0 - 46.0 %    MCV 94.5 80.0 - 100.0 fL   MCH 33.3 26.0 - 34.0 pg   MCHC 35.3 30.0 - 36.0 g/dL   RDW 13.1 11.5 - 15.5 %   Platelets 205 150 - 400 K/uL   nRBC 0.0 0.0 - 0.2 %    Comment: Performed at [facility], [place] [address] [place], [place] [address]  Comprehensive metabolic panel     Status: Abnormal   Collection Time: 09/08/20  5:31 AM  Result Value Ref Range   Sodium 137 135 - 145 mmol/L   Potassium 3.9 3.5 - 5.1 mmol/L   Chloride 108 98 - 111 mmol/L   CO2 24 22 - 32 mmol/L   Glucose, Bld 76 70 - 99 mg/dL    Comment: Glucose reference range applies only to samples taken after fasting for at least 8 hours.   BUN 6 6 - 20 mg/dL   Creatinine, Ser 0.70 0.44 - 1.00 mg/dL   Calcium 8.8 (L) 8.9 - 10.3 mg/dL   Total Protein 5.0 (L) 6.5 - 8.1 g/dL   Albumin 2.2 (L) 3.5 - 5.0 g/dL   AST 20 15 - 41 U/L   ALT 19 0 - 44 U/L   Alkaline Phosphatase 85 38 - 126 U/L   Total Bilirubin 0.5 0.3 - 1.2 mg/dL   GFR, Estimated >60 >60 mL/min    Comment: (NOTE) Calculated using the CKD-EPI Creatinine Equation (2021)    Anion gap 5  5 - 15    Comment: Performed at [facility], [place] [address], [place], [place] [address]  Type and screen [place]     Status: None   Collection Time: 09/08/20  5:31 AM  Result Value Ref Range   ABO/RH(D) AB NEG    Antibody Screen POS    Sample Expiration 09/11/2020,2359    Antibody Identification      PASSIVELY ACQUIRED ANTI-D Performed at [facility], [address], [place], [place] [address]     Imaging Studies:    [place] MFM FETAL BPP WO NON STRESS  Result Date: 09/06/2020 ----------------------------------------------------------------------  OBSTETRICS REPORT                       (Signed Final 09/06/2020 05:19 pm) ---------------------------------------------------------------------- Patient Info  ID #:       [id number]                          D.O.B.:  1996/04/10 (24 yrs)  Name:       [name]                   Visit Date:  09/06/2020 07:59 am ---------------------------------------------------------------------- Performed By  Attending:        Sander Nephew      Ref. Address:     [facility]                    MD                                                             GYN  Performed By:     Germain Osgood            Secondary Phy.:   [facility]                    RDMS                                                             Care  Referred By:      Clearnce Sorrel             [facility]                    [place] ---------------------------------------------------------------------- Orders  #  Description                           Code        Ordered By  1  [place] MFM FETAL BPP WO NON               76819.01    CHARLIE PICKENS  STRESS ----------------------------------------------------------------------  #  Order #                     Accession #                Episode #  1  [id number]                   [id number]                 [id number] ---------------------------------------------------------------------- Indications  Hypertension - Chronic with superimposed       O11.9 O10.919  preeclampsia  Echogenic intracardiac focus of the heart      O35.8XX0  (EIF)  Uterine fibroids                               O34.10  Rh negative state in antepartum                O36.0190  Negative AFP(Low Risk NIPS)  Cystic Fibrosis (CF) [place], second trimester O09.892  [redacted] weeks gestation of pregnancy                Z3A.31 ---------------------------------------------------------------------- Fetal Evaluation  Num Of Fetuses:         1  Fetal Heart Rate(bpm):  124  Cardiac Activity:       Observed  Presentation:           Cephalic  Placenta:               Posterior  P. Cord Insertion:      Previously Visualized  Amniotic Fluid  AFI FV:      Within normal limits  AFI Sum(cm)     %Tile       Largest Pocket(cm)  11.2            25          5.4  RUQ(cm)       RLQ(cm)        LUQ(cm)        LLQ(cm)  0             1.1           5.4            4.7 ---------------------------------------------------------------------- Biophysical Evaluation  Amniotic F.V:   Pocket => 2 cm             F. Tone:        Observed  F. Movement:    Observed                   Score:          8/8  F. Breathing:   Observed ---------------------------------------------------------------------- Biometry  LV:          3  mm ---------------------------------------------------------------------- OB History  Gravidity:    2         Prem:   0         SAB:   0  TOP:          1       Ectopic:  0        Living: 0 ---------------------------------------------------------------------- Gestational Age  LMP:           31w 5d        Date:  01/28/20                 EDD:  11/03/20  Best:          31w 5d     Det. By:  LMP  (01/28/20)          EDD:   11/03/20 ---------------------------------------------------------------------- Anatomy  Cranium:               Previously seen        LVOT:                   Previously seen  Cavum:                 Previously seen        Aortic Arch:            Previously seen  Ventricles:            Appears normal         Ductal Arch:            Previously seen  Choroid Plexus:        Previously seen        Diaphragm:              Appears normal  Cerebellum:            Previously seen        Stomach:                Appears normal, left                                                                        sided  Posterior Fossa:       Previously seen        Abdomen:                Appears normal  Nuchal Fold:           Previously seen        Abdominal Wall:         Previously seen  Face:                  Orbits and profile     Cord Vessels:           Previously seen                         previously seen  Lips:                  Previously seen        Kidneys:                Appear normal  Palate:                Previously seen        Bladder:                Appears normal  Thoracic:               Previously seen        Spine:                  Previously seen  Heart:  Previously seen, EIF   Upper Extremities:      Previously seen  RVOT:                  Previously seen        Lower Extremities:      Previously seen  Other:  Female gender previously seen.  Heels previously seen. Hands/RIGHT          5th digit previously visualized. Nasal bone previously visualized.          Lenses visualized. VC, 3VV and 3VTV previously  visualized. ---------------------------------------------------------------------- Cervix Uterus Adnexa  Cervix  Not visualized (advanced GA >24wks)  Uterus  Fibroids prev vis  Right Ovary  Not visualized.  Left Ovary  Not visualized.  Cul De Sac  No free fluid seen.  Adnexa  No adnexal mass visualized. ---------------------------------------------------------------------- Impression  Antenatal testing performed given maternal preeclampsia  with severe features.  The biophysical profile was 8/8 with good fetal movement and  amniotic fluid volume. ---------------------------------------------------------------------- Recommendations  Agree with the current plan listed by Dr. Harolyn Rutherford.  Continue weekly BPP's with daily NST  Delivery by 34 weeks if continues to be stable.  Consider delivery if BP requires repeated IV therapy, new  labs become abnormal or persistent headache. ----------------------------------------------------------------------               Sander Nephew, MD Electronically Signed Final Report   09/06/2020 05:19 pm ----------------------------------------------------------------------   09/06/20 BPP done today, awaiting report  Medications:  Scheduled . aspirin EC  81 mg Oral Daily  . NIFEdipine  60 mg Oral BID  . prenatal vitamin w/FE, FA  1 tablet Oral Q1200  . sodium chloride flush  3 mL Intravenous Q12H   I have reviewed the patient's current medications.  ASSESSMENT: Patient Active Problem List   Diagnosis Date Noted  . Severe pre-eclampsia  09/02/2020  . Pregnancy with adoption planned 08/31/2020  . Back pain in pregnancy 07/06/2020  . Rh negative state in antepartum period 06/07/2020   PLAN: 1) Severe preeclampsia -s/p Magnesium x 24hr -currently on ProcardiaXL 60mg  bid, BP stable -labs q 72hrs, last completed on 09/08/20- remain stable  2) Fetal well being -Cat I, Reactive NST, continue q shift monitoring -s/p BMZ 4/27-4/28 and s/p NICU consult -BPP weekly, last completed 5/5, was 8/8 - 4/28: efw 53%, 2703JK, ac 09%, cephalic, afi 13  3) Maternal well being -h/o Depression- no meds currently -Normal 2 hr GTT on 08/09/2020 -Rh neg, s/p RhoGAM @ 28wks -Social: plan is for baby up for adoption. SW consult PRN and PP -DVT prophylaxis- SCDs, pt may ambulate  DISPO: Continue with inpatient management until delivery @ 34wks or as clinically indicated pending maternal/fetal well being.   Verita Schneiders, MD 09/09/2020,11:09 AM

## 2020-09-10 DIAGNOSIS — Z3A32 32 weeks gestation of pregnancy: Secondary | ICD-10-CM | POA: Diagnosis not present

## 2020-09-10 DIAGNOSIS — O1413 Severe pre-eclampsia, third trimester: Secondary | ICD-10-CM | POA: Diagnosis not present

## 2020-09-10 LAB — COMPREHENSIVE METABOLIC PANEL
ALT: 18 U/L (ref 0–44)
AST: 20 U/L (ref 15–41)
Albumin: 2.2 g/dL — ABNORMAL LOW (ref 3.5–5.0)
Alkaline Phosphatase: 90 U/L (ref 38–126)
Anion gap: 7 (ref 5–15)
BUN: 9 mg/dL (ref 6–20)
CO2: 24 mmol/L (ref 22–32)
Calcium: 8.6 mg/dL — ABNORMAL LOW (ref 8.9–10.3)
Chloride: 106 mmol/L (ref 98–111)
Creatinine, Ser: 0.78 mg/dL (ref 0.44–1.00)
GFR, Estimated: >60 mL/min (ref >60)
Glucose, Bld: 99 mg/dL (ref 70–99)
Potassium: 3.9 mmol/L (ref 3.5–5.1)
Sodium: 137 mmol/L (ref 135–145)
Total Bilirubin: 0.8 mg/dL (ref 0.3–1.2)
Total Protein: 5 g/dL — ABNORMAL LOW (ref 6.5–8.1)

## 2020-09-10 LAB — CBC
HCT: 31.4 % — ABNORMAL LOW (ref 36.0–46.0)
Hemoglobin: 10.9 g/dL — ABNORMAL LOW (ref 12.0–15.0)
MCH: 32.8 pg (ref 26.0–34.0)
MCHC: 34.7 g/dL (ref 30.0–36.0)
MCV: 94.6 fL (ref 80.0–100.0)
Platelets: 201 10*3/uL (ref 150–400)
RBC: 3.32 MIL/uL — ABNORMAL LOW (ref 3.87–5.11)
RDW: 13.3 % (ref 11.5–15.5)
WBC: 10.8 10*3/uL — ABNORMAL HIGH (ref 4.0–10.5)
nRBC: 0 % (ref 0.0–0.2)

## 2020-09-10 MED ORDER — LABETALOL HCL 200 MG PO TABS
200.0000 mg | ORAL_TABLET | Freq: Two times a day (BID) | ORAL | Status: DC
  Administered 2020-09-10 (×2): 200 mg via ORAL
  Filled 2020-09-10 (×2): qty 1

## 2020-09-10 MED ORDER — CYCLOBENZAPRINE HCL 5 MG PO TABS
10.0000 mg | ORAL_TABLET | Freq: Three times a day (TID) | ORAL | Status: DC | PRN
  Administered 2020-09-10: 10 mg via ORAL
  Filled 2020-09-10: qty 1

## 2020-09-10 NOTE — Progress Notes (Signed)
[place]) NOTE  Kelly Lane is a 25 y.o. G2P0010 with Estimated Date of Delivery: 11/03/20   By  best clinical estimate [redacted]w[redacted]d  who is admitted for preeclampsia with severe features.    Fetal presentation is cephalic. Length of Stay:  8  Days  Date of admission:09/02/2020  Subjective: No acute complaints overnight. Patient denies any headaches, visual symptoms, RUQ/epigastric pain or other concerning symptoms. Patient reports the fetal movement as active. Patient reports uterine contraction  activity as none. Patient reports  vaginal bleeding as none Patient describes fluid per vagina as none.  Vitals:  Blood pressure (!) 151/98, pulse 98, temperature 98 F (36.7 C), temperature source Oral, resp. rate 20, last menstrual period 01/28/2020, SpO2 99 %, unknown if currently breastfeeding. Vitals:   09/09/20 2336 09/09/20 2338 09/10/20 0030 09/10/20 0415  BP: (!) 172/113 (!) 155/106 (!) 152/97 (!) 151/98  Pulse: (!) 106 (!) 102 92 98  Resp:  20  20  Temp:  98.5 F (36.9 C)  98 F (36.7 C)  TempSrc:  Oral  Oral  SpO2:  100%  99%   Physical Examination: General appearance - alert, well appearing, and in no distress Mental status - normal mood, behavior, speech, dress, motor activity, and thought processes Chest - normal breath sounds Heart - normal rate and regular rhythm Abdomen - gravid, soft and non-tender Extremities - no pedal edema noted, no calf tenderness bilaterally Skin - warm and dry   Fetal Monitoring:  Baseline: 140 bpm, Variability: moderate, Accelerations: 15x15 accels, and Decelerations: Absent  > Reactive, Category I  Labs:  Results for orders placed or performed during the hospital encounter of 09/02/20 (from the past 72 hour(s))  CBC     Status: Abnormal   Collection Time: 09/08/20  5:31 AM  Result Value Ref Range   WBC 10.5 4.0 - 10.5 K/uL   RBC 3.30 (L) 3.87 - 5.11 MIL/uL   Hemoglobin 11.0 (L) 12.0 - 15.0 g/dL   HCT 31.2 (L)  36.0 - 46.0 %   MCV 94.5 80.0 - 100.0 fL   MCH 33.3 26.0 - 34.0 pg   MCHC 35.3 30.0 - 36.0 g/dL   RDW 13.1 11.5 - 15.5 %   Platelets 205 150 - 400 K/uL   nRBC 0.0 0.0 - 0.2 %    Comment: Performed at [facility], [place] [address] [place], [place] [address]  Comprehensive metabolic panel     Status: Abnormal   Collection Time: 09/08/20  5:31 AM  Result Value Ref Range   Sodium 137 135 - 145 mmol/L   Potassium 3.9 3.5 - 5.1 mmol/L   Chloride 108 98 - 111 mmol/L   CO2 24 22 - 32 mmol/L   Glucose, Bld 76 70 - 99 mg/dL    Comment: Glucose reference range applies only to samples taken after fasting for at least 8 hours.   BUN 6 6 - 20 mg/dL   Creatinine, Ser 0.70 0.44 - 1.00 mg/dL   Calcium 8.8 (L) 8.9 - 10.3 mg/dL   Total Protein 5.0 (L) 6.5 - 8.1 g/dL   Albumin 2.2 (L) 3.5 - 5.0 g/dL   AST 20 15 - 41 U/L   ALT 19 0 - 44 U/L   Alkaline Phosphatase 85 38 - 126 U/L   Total Bilirubin 0.5 0.3 - 1.2 mg/dL   GFR, Estimated >60 >60 mL/min    Comment: (NOTE) Calculated using the CKD-EPI Creatinine Equation (2021)    Anion gap  5 5 - 15    Comment: Performed at [facility], [place] [address] [place], [place] [address]  Type and screen [place]     Status: None   Collection Time: 09/08/20  5:31 AM  Result Value Ref Range   ABO/RH(D) AB NEG    Antibody Screen POS    Sample Expiration 09/11/2020,2359    Antibody Identification      PASSIVELY ACQUIRED ANTI-D Performed at [facility], [address], [place], [place] [address]   CBC     Status: Abnormal   Collection Time: 09/10/20  4:22 AM  Result Value Ref Range   WBC 10.8 (H) 4.0 - 10.5 K/uL   RBC 3.32 (L) 3.87 - 5.11 MIL/uL   Hemoglobin 10.9 (L) 12.0 - 15.0 g/dL   HCT 31.4 (L) 36.0 - 46.0 %   MCV 94.6 80.0 - 100.0 fL   MCH 32.8 26.0 - 34.0 pg   MCHC 34.7 30.0 - 36.0 g/dL   RDW 13.3 11.5 - 15.5 %   Platelets 201 150 - 400 K/uL   nRBC 0.0 0.0 - 0.2 %    Comment: Performed at [facility], [place] [address] [place], [place] [address]  Comprehensive metabolic panel     Status: Abnormal   Collection Time: 09/10/20  4:22 AM  Result Value Ref Range   Sodium 137 135 - 145 mmol/L   Potassium 3.9 3.5 - 5.1 mmol/L   Chloride 106 98 - 111 mmol/L   CO2 24 22 - 32 mmol/L   Glucose, Bld 99 70 - 99 mg/dL    Comment: Glucose reference range applies only to samples taken after fasting for at least 8 hours.   BUN 9 6 - 20 mg/dL   Creatinine, Ser 0.78 0.44 - 1.00 mg/dL   Calcium 8.6 (L) 8.9 - 10.3 mg/dL   Total Protein 5.0 (L) 6.5 - 8.1 g/dL   Albumin 2.2 (L) 3.5 - 5.0 g/dL   AST 20 15 - 41 U/L   ALT 18 0 - 44 U/L   Alkaline Phosphatase 90 38 - 126 U/L   Total Bilirubin 0.8 0.3 - 1.2 mg/dL   GFR, Estimated >60 >60 mL/min    Comment: (NOTE) Calculated using the CKD-EPI Creatinine Equation (2021)    Anion gap 7 5 - 15    Comment: Performed at [facility], [place] [address] [place], [place] [address]    Imaging Studies:    [place] MFM FETAL BPP WO NON STRESS  Result Date: 09/06/2020 ----------------------------------------------------------------------  OBSTETRICS REPORT                       (Signed Final 09/06/2020 05:19 pm) ---------------------------------------------------------------------- Patient Info  ID #:       [id number]                          D.O.B.:  [birth year] (24 yrs)  Name:       Kelly Lane                   Visit Date: 09/06/2020 07:59 am ---------------------------------------------------------------------- Performed By  Attending:        Sander Nephew      Ref. Address:     [facility]                    MD  GYN  Performed By:     Germain Osgood            Secondary Phy.:   [facility]                    RDMS                                                             Care  Referred By:      Clearnce Sorrel             [facility]                    [place] ---------------------------------------------------------------------- Orders  #  Description                           Code        Ordered By  1  [place] MFM FETAL BPP WO NON               [id number]    CHARLIE PICKENS     STRESS ----------------------------------------------------------------------  #  Order #                     Accession #                Episode #  1  [id number]                   [id number]                 [id number] ---------------------------------------------------------------------- Indications  Hypertension - Chronic with superimposed       O11.9 O10.919  preeclampsia  Echogenic intracardiac focus of the heart      O35.8XX0  (EIF)  Uterine fibroids                               O34.10  Rh negative state in antepartum                O36.0190  Negative AFP(Low Risk NIPS)  Cystic Fibrosis (CF) [place], second trimester O09.892  [redacted] weeks gestation of pregnancy                Z3A.31 ---------------------------------------------------------------------- Fetal Evaluation  Num Of Fetuses:         1  Fetal Heart Rate(bpm):  124  Cardiac Activity:       Observed  Presentation:           Cephalic  Placenta:               Posterior  P. Cord Insertion:      Previously Visualized  Amniotic Fluid  AFI FV:      Within normal limits  AFI Sum(cm)     %Tile       Largest Pocket(cm)  11.2  25          5.4  RUQ(cm)       RLQ(cm)       LUQ(cm)        LLQ(cm)  0             1.1           5.4            4.7 ---------------------------------------------------------------------- Biophysical Evaluation  Amniotic F.V:   Pocket => 2 cm             F. Tone:        Observed  F. Movement:    Observed                   Score:          8/8  F. Breathing:   Observed ---------------------------------------------------------------------- Biometry  LV:          3  mm ---------------------------------------------------------------------- OB History  Gravidity:    2         Prem:   0         SAB:   0  TOP:           1       Ectopic:  0        Living: 0 ---------------------------------------------------------------------- Gestational Age  LMP:           31w 5d        Date:  01/28/20                 EDD:   11/03/20  Best:          31w 5d     Det. By:  LMP  (01/28/20)          EDD:   11/03/20 ---------------------------------------------------------------------- Anatomy  Cranium:               Previously seen        LVOT:                   Previously seen  Cavum:                 Previously seen        Aortic Arch:            Previously seen  Ventricles:            Appears normal         Ductal Arch:            Previously seen  Choroid Plexus:        Previously seen        Diaphragm:              Appears normal  Cerebellum:            Previously seen        Stomach:                Appears normal, left                                                                        sided  Posterior Fossa:       Previously seen  Abdomen:                Appears normal  Nuchal Fold:           Previously seen        Abdominal Wall:         Previously seen  Face:                  Orbits and profile     Cord Vessels:           Previously seen                         previously seen  Lips:                  Previously seen        Kidneys:                Appear normal  Palate:                Previously seen        Bladder:                Appears normal  Thoracic:              Previously seen        Spine:                  Previously seen  Heart:                 Previously seen, EIF   Upper Extremities:      Previously seen  RVOT:                  Previously seen        Lower Extremities:      Previously seen  Other:  Female gender previously seen.  Heels previously seen. Hands/RIGHT          5th digit previously visualized. Nasal bone previously visualized.          Lenses visualized. VC, 3VV and 3VTV previously  visualized. ---------------------------------------------------------------------- Cervix Uterus Adnexa  Cervix  Not  visualized (advanced GA >24wks)  Uterus  Fibroids prev vis  Right Ovary  Not visualized.  Left Ovary  Not visualized.  Cul De Sac  No free fluid seen.  Adnexa  No adnexal mass visualized. ---------------------------------------------------------------------- Impression  Antenatal testing performed given maternal preeclampsia  with severe features.  The biophysical profile was 8/8 with good fetal movement and  amniotic fluid volume. ---------------------------------------------------------------------- Recommendations  Agree with the current plan listed by Dr. Harolyn Rutherford.  Continue weekly BPP's with daily NST  Delivery by 34 weeks if continues to be stable.  Consider delivery if BP requires repeated IV therapy, new  labs become abnormal or persistent headache. ----------------------------------------------------------------------               Sander Nephew, MD Electronically Signed Final Report   09/06/2020 05:19 pm ----------------------------------------------------------------------    Medications:  Scheduled . aspirin EC  81 mg Oral Daily  . COVID-19 mRNA Vac-TriS ([employer])  0.3 mL Intramuscular Once  . labetalol  200 mg Oral BID  . NIFEdipine  60 mg Oral BID  . prenatal vitamin w/FE, FA  1 tablet Oral Q1200  . sodium chloride flush  3 mL Intravenous Q12H   I have reviewed the patient's current medications.  ASSESSMENT: Principal Problem:   Severe preeclampsia, third trimester Active Problems:  Supervision of high-risk pregnancy   Rh negative state in antepartum period   Pregnancy with adoption planned   [redacted] weeks gestation of pregnancy   PLAN: 1) Severe preeclampsia -BP increased over the last day, in 150s/90s. No symptoms. Labetalol 200 mg po bid added to regimen of Procardia XL 60 mg bid.  Will watch carefully.  If BP continues to rise with escalating needs for control, delivery will be indicated. Stable labs today. s/p Magnesium x 24hr -currently on ProcardiaXL 60mg  bid, BP  stable -labs q 72hrs, last completed on 09/10/20- remain stable  2) Fetal well being -Cat I, Reactive NST, continue q shift monitoring -s/p BMZ 4/27-4/28 and s/p NICU consult -BPP weekly, last completed 5/5, was 8/8 - 4/28: efw 53%, 123456, ac [id number], cephalic, afi 13  3) Maternal well being -h/o Depression- no meds currently -Normal 2 hr GTT on 08/09/2020 -Rh neg, s/p RhoGAM @ 28wks -Social: plan is for baby up for adoption. SW consult PRN and PP -DVT prophylaxis- SCDs, pt may ambulate  DISPO: Continue with inpatient management until delivery @ 34wks or as clinically indicated pending maternal/fetal well being.   Verita Schneiders, MD 09/10/2020,7:48 AM

## 2020-09-10 NOTE — Progress Notes (Signed)
Initial Nutrition Assessment  DOCUMENTATION CODES:   Not applicable  INTERVENTION:  Regular diet Pt may order double protein portions and snacks TID if she makes request when ordering meals   NUTRITION DIAGNOSIS:  Increased nutrient needs related to  (pregnancy and fetal growth requirements) as evidenced by  (32 weeks IUP).  GOAL:  Patient will meet greater than or equal to 90% of their needs   MONITOR:  Weight trends  REASON FOR ASSESSMENT:  LOS    ASSESSMENT:  32 2/7 weeks,admitted for preeclampsia with severe features. Wt at 16 weeks 64.9 kg, BMI 25. 31 lbs weight gain.   Diet Order:   Diet Order            Diet regular Room service appropriate? Yes; Fluid consistency: Thin  Diet effective now                 EDUCATION NEEDS:   No education needs have been identified at this time  Skin:  Skin Assessment: Reviewed RN Assessment  Height:   Ht Readings from Last 1 Encounters:  08/29/20 5' 2.5" (1.588 m)    Weight:   Wt Readings from Last 1 Encounters:  08/29/20 78.9 kg    Ideal Body Weight:   115 lbs  BMI:  There is no height or weight on file to calculate BMI.  Estimated Nutritional Needs:   Kcal:  1900-2100  Protein:  85-95 g  Fluid:  2.2 L

## 2020-09-11 DIAGNOSIS — Z3A32 32 weeks gestation of pregnancy: Secondary | ICD-10-CM | POA: Diagnosis not present

## 2020-09-11 DIAGNOSIS — O1413 Severe pre-eclampsia, third trimester: Secondary | ICD-10-CM | POA: Diagnosis not present

## 2020-09-11 LAB — COMPREHENSIVE METABOLIC PANEL
ALT: 17 U/L (ref 0–44)
AST: 20 U/L (ref 15–41)
Albumin: 2.3 g/dL — ABNORMAL LOW (ref 3.5–5.0)
Alkaline Phosphatase: 94 U/L (ref 38–126)
Anion gap: 9 (ref 5–15)
BUN: 9 mg/dL (ref 6–20)
CO2: 21 mmol/L — ABNORMAL LOW (ref 22–32)
Calcium: 8.7 mg/dL — ABNORMAL LOW (ref 8.9–10.3)
Chloride: 107 mmol/L (ref 98–111)
Creatinine, Ser: 0.75 mg/dL (ref 0.44–1.00)
GFR, Estimated: >60 mL/min (ref >60)
Glucose, Bld: 83 mg/dL (ref 70–99)
Potassium: 3.9 mmol/L (ref 3.5–5.1)
Sodium: 137 mmol/L (ref 135–145)
Total Bilirubin: 0.8 mg/dL (ref 0.3–1.2)
Total Protein: 5.3 g/dL — ABNORMAL LOW (ref 6.5–8.1)

## 2020-09-11 LAB — TYPE AND SCREEN
ABO/RH(D): AB NEG
Antibody Screen: POSITIVE

## 2020-09-11 LAB — CBC
HCT: 33.7 % — ABNORMAL LOW (ref 36.0–46.0)
Hemoglobin: 11.6 g/dL — ABNORMAL LOW (ref 12.0–15.0)
MCH: 32.8 pg (ref 26.0–34.0)
MCHC: 34.4 g/dL (ref 30.0–36.0)
MCV: 95.2 fL (ref 80.0–100.0)
Platelets: 215 10*3/uL (ref 150–400)
RBC: 3.54 MIL/uL — ABNORMAL LOW (ref 3.87–5.11)
RDW: 13.2 % (ref 11.5–15.5)
WBC: 10.2 10*3/uL (ref 4.0–10.5)
nRBC: 0 % (ref 0.0–0.2)

## 2020-09-11 MED ORDER — LABETALOL HCL 200 MG PO TABS
300.0000 mg | ORAL_TABLET | Freq: Two times a day (BID) | ORAL | Status: DC
  Administered 2020-09-11 (×2): 300 mg via ORAL
  Filled 2020-09-11 (×2): qty 1

## 2020-09-11 NOTE — Progress Notes (Signed)
Patient ID: Kelly Lane, female   DOB: 08/07/1995, 25 y.o.   MRN: [id number] [place] COMPREHENSIVE PROGRESS NOTE  Kelly Lane is a 25 y.o. G2P0010 at [redacted]w[redacted]d  who is admitted for [facility].   Fetal presentation is cephalic. Length of Stay:  9  Days  Subjective: Pt without complaints today. Denies HA or visual changes.  Patient reports good fetal movement.  She reports no uterine contractions, no bleeding and no loss of fluid per vagina.  Vitals:  Blood pressure (!) 139/94, pulse 97, temperature 98.4 F (36.9 C), resp. rate 18, last menstrual period 01/28/2020, SpO2 100 %, unknown if currently breastfeeding.   Physical Examination: Lungs clear Heart RRR Abd soft + BS gravid non tender Ext non tender  Fetal Monitoring:  130-140's, + accels, no ut ctx  Labs:  Results for orders placed or performed during the hospital encounter of 09/02/20 (from the past 24 hour(s))  CBC   Collection Time: 09/11/20  6:08 AM  Result Value Ref Range   WBC 10.2 4.0 - 10.5 K/uL   RBC 3.54 (L) 3.87 - 5.11 MIL/uL   Hemoglobin 11.6 (L) 12.0 - 15.0 g/dL   HCT 33.7 (L) 36.0 - 46.0 %   MCV 95.2 80.0 - 100.0 fL   MCH 32.8 26.0 - 34.0 pg   MCHC 34.4 30.0 - 36.0 g/dL   RDW 13.2 11.5 - 15.5 %   Platelets 215 150 - 400 K/uL   nRBC 0.0 0.0 - 0.2 %  Comprehensive metabolic panel   Collection Time: 09/11/20  6:08 AM  Result Value Ref Range   Sodium 137 135 - 145 mmol/L   Potassium 3.9 3.5 - 5.1 mmol/L   Chloride 107 98 - 111 mmol/L   CO2 21 (L) 22 - 32 mmol/L   Glucose, Bld 83 70 - 99 mg/dL   BUN 9 6 - 20 mg/dL   Creatinine, Ser 0.75 0.44 - 1.00 mg/dL   Calcium 8.7 (L) 8.9 - 10.3 mg/dL   Total Protein 5.3 (L) 6.5 - 8.1 g/dL   Albumin 2.3 (L) 3.5 - 5.0 g/dL   AST 20 15 - 41 U/L   ALT 17 0 - 44 U/L   Alkaline Phosphatase 94 38 - 126 U/L   Total Bilirubin 0.8 0.3 - 1.2 mg/dL   GFR, Estimated >60 >60 mL/min   Anion gap 9 5 - 15  Type and screen [facility]   Collection  Time: 09/11/20  6:08 AM  Result Value Ref Range   ABO/RH(D) AB NEG    Antibody Screen POS    Sample Expiration 09/14/2020,2359    Antibody Identification      PASSIVELY ACQUIRED ANTI-D Performed at [facility], [address], [place], [place] [address]     Imaging Studies:    NA   Medications:  Scheduled . aspirin EC  81 mg Oral Daily  . labetalol  300 mg Oral BID  . NIFEdipine  60 mg Oral BID  . prenatal vitamin w/FE, FA  1 tablet Oral Q1200  . sodium chloride flush  3 mL Intravenous Q12H   I have reviewed the patient's current medications.  ASSESSMENT: IUP 32 3/7 weeks SPEC Rh neg, s/p Rhogam   PLAN: Stable. BP OK control, will increase Labetalol. Fetal well being reassuring. BPP Thursday. In house til delivery at 34 weeks or per maternal/fetal indications. Continue routine antenatal care.   Chancy Milroy 09/11/2020,12:42 PM

## 2020-09-12 MED ORDER — LABETALOL HCL 200 MG PO TABS
400.0000 mg | ORAL_TABLET | Freq: Two times a day (BID) | ORAL | Status: DC
  Administered 2020-09-12 (×2): 400 mg via ORAL
  Filled 2020-09-12 (×2): qty 2

## 2020-09-12 MED ORDER — CARBAMIDE PEROXIDE 6.5 % OT SOLN
5.0000 [drp] | Freq: Two times a day (BID) | OTIC | Status: DC
  Administered 2020-09-13 – 2020-09-19 (×3): 5 [drp] via OTIC
  Filled 2020-09-12: qty 15

## 2020-09-12 NOTE — Progress Notes (Signed)
[place]) NOTE  Kelly Lane is a 25 y.o. G2P0010 with Estimated Date of Delivery: 11/03/20   By  best clinical estimate [redacted]w[redacted]d  who is admitted for severe preeclampsia.    Fetal presentation is cephalic. Length of Stay:  10  Days  Date of admission:09/02/2020  Subjective: Pt resting comfortably, reports no acute complaints overnight.  Denies HA, blurry vision or RUQ pain. Patient reports the fetal movement as active. Patient reports uterine contraction  activity as none. Patient reports  vaginal bleeding as none. Patient describes fluid per vagina as None.  Vitals:  Blood pressure (!) 126/97, pulse 86, temperature 97.8 F (36.6 C), temperature source Oral, resp. rate 17, last menstrual period 01/28/2020, SpO2 100 %, unknown if currently breastfeeding. Vitals:   09/11/20 1538 09/11/20 2034 09/11/20 2238 09/12/20 0352  BP: 133/82 (!) 139/93 (!) 143/80 (!) 126/97  Pulse: 94 91 96 86  Resp: 18 18 17 17   Temp: 98.3 F (36.8 C) 98.4 F (36.9 C)  97.8 F (36.6 C)  TempSrc: Oral   Oral  SpO2: 98% 100%  100%   Physical Examination:  General appearance - alert, well appearing, and in no distress Mental status - normal mood and behavior Chest - clear to auscultation, no wheezes, rales or rhonchi, symmetric air entry Heart - normal rate and regular rhythm Abdomen - gravid, non-tender Musculoskeletal - no calf tenderness bilaterally Extremities - no pedal edema noted Skin - warm and dry  Fetal Monitoring:  Baseline: 135bpm bpm, Variability: moderate, Accelerations: +15x15 present and Decelerations: Absent   reactive  Labs:  No results found for this or any previous visit (from the past 24 hour(s)).  Imaging Studies:    No results found.   Medications:  Scheduled . aspirin EC  81 mg Oral Daily  . labetalol  300 mg Oral BID  . NIFEdipine  60 mg Oral BID  . prenatal vitamin w/FE, FA  1 tablet Oral Q1200  . sodium chloride flush  3 mL Intravenous Q12H    I have reviewed the patient's current medications.  ASSESSMENT/PLAN G2P0010 [redacted]w[redacted]d Estimated Date of Delivery: 11/03/20 admitted with severe preeclampsia 1) Severe preeclampsia -s/p Magnesium x 24hr -BP stable overnight, continue Labetalol 300mg  bid and Procardia XL 60mg  bid -currently asymptomatic -continue labs q 72hr   2) Fetal-well being -Cat. I, Reactive NST, continue q shift monitoring -s/p BMZ 4/27-4/28 -BPP weekly on Thursday  3) Maternal well being -h/o Depression- no meds currently -Rh neg, s/o RhoGAM -DVTs- SCDs while in bed, pt may ambulate Social: baby to be up for adoption, SW consulted   DISPO: Continue with in-house monitoring, plan for delivery @ 34wks or as clinically indicated based on maternal/fetal status   Janyth Pupa, DO Attending [place], Product/process development scientist for [employer], [place]

## 2020-09-12 NOTE — Progress Notes (Signed)
  Chaplain visited with pt to provide support during her hospital stay.  Chaplain asked open ended questions to facilitate story telling and emotional expression.  Pt shared that she has been considering adoption, but is feeling confused and emotionally torn about the decision. Chaplain listened to patient's story and concerns and acknowledged her strong response to a difficult experience and asked guided questions to facilitate decision making. Pt named her concerns about both options and chaplain validated her feelings and normalized her experience.  Chaplain encouraged pt to take her time in making her decision and to find someone to advocate for her and her needs in this process.  Chaplain talked with patient about consulting social work for more resources and information and reminded pt that there is always time to make a decision and that she can change her mind as this process unfolds and she continues to discern what is best for her and for the baby.  Chaplain made referral to [facility], for further support.  Will continue to follow.  Please page as further needs arise.  Donald Prose. Elyn Peers, M.Div. [facility] Chaplain Pager [phone] Office [phone]      09/12/20 1400  Clinical Encounter Type  Visited With Patient  Visit Type Spiritual support;Psychological support  Referral From Nurse;Physician  Consult/Referral To Social work  Spiritual Encounters  Spiritual Needs Emotional;Grief support  Stress Factors  Patient Stress Factors Loss of control

## 2020-09-13 ENCOUNTER — Inpatient Hospital Stay (HOSPITAL_BASED_OUTPATIENT_CLINIC_OR_DEPARTMENT_OTHER): Payer: BC Managed Care – PPO

## 2020-09-13 DIAGNOSIS — Z3A32 32 weeks gestation of pregnancy: Secondary | ICD-10-CM | POA: Diagnosis not present

## 2020-09-13 DIAGNOSIS — O3413 Maternal care for benign tumor of corpus uteri, third trimester: Secondary | ICD-10-CM | POA: Diagnosis not present

## 2020-09-13 DIAGNOSIS — O09893 Supervision of other high risk pregnancies, third trimester: Secondary | ICD-10-CM

## 2020-09-13 DIAGNOSIS — O358XX0 Maternal care for other (suspected) fetal abnormality and damage, not applicable or unspecified: Secondary | ICD-10-CM | POA: Diagnosis not present

## 2020-09-13 DIAGNOSIS — D259 Leiomyoma of uterus, unspecified: Secondary | ICD-10-CM

## 2020-09-13 DIAGNOSIS — O1413 Severe pre-eclampsia, third trimester: Secondary | ICD-10-CM

## 2020-09-13 DIAGNOSIS — O360130 Maternal care for anti-D [Rh] antibodies, third trimester, not applicable or unspecified: Secondary | ICD-10-CM

## 2020-09-13 DIAGNOSIS — O113 Pre-existing hypertension with pre-eclampsia, third trimester: Secondary | ICD-10-CM

## 2020-09-13 MED ORDER — LABETALOL HCL 200 MG PO TABS
400.0000 mg | ORAL_TABLET | Freq: Three times a day (TID) | ORAL | Status: DC
  Administered 2020-09-13 – 2020-09-16 (×12): 400 mg via ORAL
  Filled 2020-09-13 (×12): qty 2

## 2020-09-13 NOTE — Progress Notes (Signed)
Patient ID: Kelly Lane, female   DOB: 01-22-1996, 25 y.o.   MRN: [id number] [place] COMPREHENSIVE PROGRESS NOTE  Kelly Lane is a 25 y.o. G2P0010 at [redacted]w[redacted]d  who is admitted for [facility].   Fetal presentation is cephalic. Length of Stay:  11  Days  Subjective: Pt without complaints this morning. Denies HA or visual changes Patient reports good fetal movement.  She reports no uterine contractions, no bleeding and no loss of fluid per vagina.  Vitals:  Blood pressure (!) 147/107, pulse 87, temperature 98.1 F (36.7 C), temperature source Oral, resp. rate 18, last menstrual period 01/28/2020, SpO2 98 %, unknown if currently breastfeeding. Physical Examination: Lungs clear Heart RRR Abd soft, + BS, gravid Ext non tender   Fetal Monitoring:  130's, + acels, no decels, no ut ctx  Labs:  No results found for this or any previous visit (from the past 24 hour(s)).  Imaging Studies:    BPP today   Medications:  Scheduled . aspirin EC  81 mg Oral Daily  . carbamide peroxide  5 drop Both EARS BID  . labetalol  400 mg Oral TID  . NIFEdipine  60 mg Oral BID  . prenatal vitamin w/FE, FA  1 tablet Oral Q1200  . sodium chloride flush  3 mL Intravenous Q12H   I have reviewed the patient's current medications.  ASSESSMENT: IUP 32 5/7 weeks SPEC, Mag and BMZ Rh negative, S/P Rhogam  PLAN: Stable. BP slightly elevated this morning. Will adjust Labetalol, HELLP labs normal yesterday. Fetal well being reassuring. BPP today. Delivery at 34 weeks or for maternal/fetal indications Continue routine antenatal care.   Chancy Milroy 09/13/2020,9:39 AM

## 2020-09-13 NOTE — Progress Notes (Signed)
CSW met with MOB at her bedside in room 106.  When CSW arrived, MOB was sitting up in bed; she appeared happy and comfortable. CSW explained CSW's role and MOB was receptive to meeting with CSW. MOB was polite, easy to engage, and forthcoming about her thoughts and emotions. CSW asked about MOB's adoption plan and without hesitation CSW shared her story about why she decided to do an adoption plan and her connection with potential adopting parents. MOB also shared detailed plans pertaining to her wishes post delivery.  CSW suggested that MOB give birthing plan to hospital staff in effort to assist MOB with adhering to her birth plan; MOB agreed to considered allowing hospital staff to have access after her plan is finalized.    CSW also reviewed other postpartum parenting options with MOB. MOB agreed to allow CSW to follow-up with MOB next week to assist with MOB's postpartum plan.  MOB identified her mom and her friend as supports.  MOB also shared that potential adopting parents are Junie Panning [phone]) and Remi Haggard. MOB communicated that is working with attorney Wendall Stade [phone])  CSW provided MOB with CSW's contact information and encouraged MOB to reach out to CSW if a need arise.   Laurey Arrow, MSW, LCSW Clinical Social Work [phone]

## 2020-09-13 NOTE — Plan of Care (Signed)
  Problem: Clinical Measurements: Goal: Diagnostic test results will improve Outcome: Progressing   Problem: Pain Managment: Goal: General experience of comfort will improve Outcome: Progressing   Problem: Education: Goal: Knowledge of disease or condition will improve Outcome: Progressing Goal: Knowledge of the prescribed therapeutic regimen will improve Outcome: Progressing   Problem: Clinical Measurements: Goal: Complications related to disease process, condition or treatment will be avoided or minimized Outcome: Progressing   Problem: Education: Goal: Knowledge of disease or condition will improve Outcome: Progressing Goal: Knowledge of the prescribed therapeutic regimen will improve Outcome: Progressing

## 2020-09-14 DIAGNOSIS — Z3A32 32 weeks gestation of pregnancy: Secondary | ICD-10-CM | POA: Diagnosis not present

## 2020-09-14 DIAGNOSIS — O1413 Severe pre-eclampsia, third trimester: Secondary | ICD-10-CM | POA: Diagnosis not present

## 2020-09-14 LAB — CBC
HCT: 29.7 % — ABNORMAL LOW (ref 36.0–46.0)
Hemoglobin: 10.5 g/dL — ABNORMAL LOW (ref 12.0–15.0)
MCH: 33.2 pg (ref 26.0–34.0)
MCHC: 35.4 g/dL (ref 30.0–36.0)
MCV: 94 fL (ref 80.0–100.0)
Platelets: 183 10*3/uL (ref 150–400)
RBC: 3.16 MIL/uL — ABNORMAL LOW (ref 3.87–5.11)
RDW: 13 % (ref 11.5–15.5)
WBC: 10.4 10*3/uL (ref 4.0–10.5)
nRBC: 0 % (ref 0.0–0.2)

## 2020-09-14 LAB — TYPE AND SCREEN
ABO/RH(D): AB NEG
Antibody Screen: POSITIVE

## 2020-09-14 LAB — COMPREHENSIVE METABOLIC PANEL
ALT: 14 U/L (ref 0–44)
AST: 20 U/L (ref 15–41)
Albumin: 2.2 g/dL — ABNORMAL LOW (ref 3.5–5.0)
Alkaline Phosphatase: 92 U/L (ref 38–126)
Anion gap: 8 (ref 5–15)
BUN: 12 mg/dL (ref 6–20)
CO2: 23 mmol/L (ref 22–32)
Calcium: 8.7 mg/dL — ABNORMAL LOW (ref 8.9–10.3)
Chloride: 106 mmol/L (ref 98–111)
Creatinine, Ser: 0.78 mg/dL (ref 0.44–1.00)
GFR, Estimated: >60 mL/min (ref >60)
Glucose, Bld: 98 mg/dL (ref 70–99)
Potassium: 3.8 mmol/L (ref 3.5–5.1)
Sodium: 137 mmol/L (ref 135–145)
Total Bilirubin: 0.7 mg/dL (ref 0.3–1.2)
Total Protein: 5 g/dL — ABNORMAL LOW (ref 6.5–8.1)

## 2020-09-14 LAB — FERRITIN: Ferritin: 41 ng/mL (ref 11–307)

## 2020-09-14 NOTE — Progress Notes (Signed)
Daily Antepartum Note  Admission Date: 09/02/2020 Current Date: 09/14/2020 8:41 AM  Kelly Lane is a 25 y.o. G2P0010 @ [redacted]w[redacted]d, admitted for severe pre-eclampsia (BPs, protein, neuro s/s). Patient was previously here but left AMA on 4/23  Pregnancy complicated by:  Patient Active Problem List   Diagnosis Date Noted  . Pregnancy with adoption planned 08/31/2020  . [redacted] weeks gestation of pregnancy 08/31/2020  . COVID-19 affecting pregnancy in first trimester 08/30/2020  . Severe preeclampsia, third trimester 08/29/2020  . Back pain in pregnancy 07/06/2020  . Rh negative state in antepartum period 06/07/2020  . Supervision of high-risk pregnancy 05/24/2020    Overnight/24hr events:  None  Subjective:  Patient resting comfortably. No s/s of pre-eclampsia, labor  Objective:    Current Vital Signs 24h Vital Sign Ranges  T 98.5 F (36.9 C) Temp  Avg: 98.4 F (36.9 C)  Min: 98.1 F (36.7 C)  Max: 98.6 F (37 C)  BP (!) 141/89 BP  Min: 116/85  Max: 143/103  HR (!) 105 Pulse  Avg: 87.3  Min: 79  Max: 105  RR 18 Resp  Avg: 18  Min: 18  Max: 18  SaO2 98 % Room Air SpO2  Avg: 98 %  Min: 98 %  Max: 98 %       24 Hour I/O Current Shift I/O  Time Ins Outs 05/12 0701 - 05/13 0700 In: 3 [I.V.:3] Out: -  No intake/output data recorded.   Patient Vitals for the past 24 hrs:  BP Temp Temp src Pulse Resp SpO2  09/14/20 0402 (!) 141/89 98.5 F (36.9 C) Oral (!) 105 18 --  09/13/20 2331 116/85 98.6 F (37 C) Oral 88 18 --  09/13/20 1951 126/80 98.3 F (36.8 C) Oral 85 18 --  09/13/20 1550 (!) 135/93 98.6 F (37 C) Oral 87 18 98 %  09/13/20 1137 (!) 143/103 98.1 F (36.7 C) Oral 79 18 98 %  09/13/20 1011 (!) 140/102 -- -- 83 -- --  09/13/20 1009 (!) 137/103 -- -- 84 -- --   FHT: 134 baseline, +accels, no decel, mod variability Tocometry: quiet x 56m  Physical exam: General: Well nourished, well developed female in no acute distress. Abdomen: gravid nttp Respiratory: no  respiratory distress Extremities: no clubbing, cyanosis or edema Skin: Warm and dry.   Medications: Current Facility-Administered Medications  Medication Dose Route Frequency Provider Last Rate Last Admin  . acetaminophen (TYLENOL) tablet 650 mg  650 mg Oral Q4H PRN Aletha Halim, MD   650 mg at 09/10/20 1318  . aspirin EC tablet 81 mg  81 mg Oral Daily Aletha Halim, MD   81 mg at 09/13/20 1006  . calcium carbonate (TUMS - dosed in mg elemental calcium) chewable tablet 400 mg of elemental calcium  2 tablet Oral Q4H PRN Aletha Halim, MD   400 mg of elemental calcium at 09/03/20 0224  . carbamide peroxide (DEBROX) 6.5 % OTIC (EAR) solution 5 drop  5 drop Both EARS BID Woodroe Mode, MD   5 drop at 09/13/20 2221  . cyclobenzaprine (FLEXERIL) tablet 10 mg  10 mg Oral TID PRN Donnamae Jude, MD   10 mg at 09/10/20 2123  . docusate sodium (COLACE) capsule 100 mg  100 mg Oral BID PRN Aletha Halim, MD      . labetalol (NORMODYNE) injection 20 mg  20 mg Intravenous PRN Aletha Halim, MD   20 mg at 09/02/20 1343   And  . labetalol (NORMODYNE)  injection 40 mg  40 mg Intravenous PRN Aletha Halim, MD       And  . labetalol (NORMODYNE) injection 80 mg  80 mg Intravenous PRN Aletha Halim, MD       And  . hydrALAZINE (APRESOLINE) injection 10 mg  10 mg Intravenous PRN Aletha Halim, MD      . labetalol (NORMODYNE) tablet 400 mg  400 mg Oral TID Chancy Milroy, MD   400 mg at 09/13/20 2220  . NIFEdipine (PROCARDIA-XL/NIFEDICAL-XL) 24 hr tablet 60 mg  60 mg Oral BID Aletha Halim, MD   60 mg at 09/13/20 2221  . prenatal vitamin w/FE, FA (NATACHEW) chewable tablet 1 tablet  1 tablet Oral Q1200 Anyanwu, Sallyanne Havers, MD   1 tablet at 09/13/20 1151  . sodium chloride flush (NS) 0.9 % injection 3 mL  3 mL Intravenous Q12H Anyanwu, Ugonna A, MD   3 mL at 09/13/20 2221    Labs:  Recent Labs  Lab 09/10/20 0422 09/11/20 0608 09/14/20 0513  WBC 10.8* 10.2 10.4  HGB 10.9* 11.6*  10.5*  HCT 31.4* 33.7* 29.7*  PLT 201 215 183    Recent Labs  Lab 09/10/20 0422 09/11/20 0608 09/14/20 0513  NA 137 137 137  K 3.9 3.9 3.8  CL 106 107 106  CO2 24 21* 23  BUN 9 9 12   CREATININE 0.78 0.75 0.78  CALCIUM 8.6* 8.7* 8.7*  PROT 5.0* 5.3* 5.0*  BILITOT 0.8 0.8 0.7  ALKPHOS 90 94 92  ALT 18 17 14   AST 20 20 20   GLUCOSE 99 83 98     Radiology:  5/12: ceph, afi 10.7, 8/8 4/28: efw 53%, 1829HB, ac 71%, cephalic, bpp 8/8, afi 13  Assessment & Plan:  Pt doing well *Pregnancy: reactive NST; continue with daily testing.  *Severe pre-eclampsia: continue current plan of care. q3d bpp and q3d labs *Preterm: consider rescue dose timing with delivery -s/p bmz on 4/27 and 4/28. S/p NICU consult *Social: plan is for baby up for adoption. SW consult PRN and PP *Rh neg: rhogam UTD. *History of depression: no current issues.  *PPx: SCDs, OOB ad lib *FEN/GI:  regular diet *Dispo: inpatient until delivery. Try to get to 34wks  Durene Romans MD Attending Center for [facility]) GYN Consult Phone: [phone] (M-F, 0800-1700) & [phone] (Off hours, weekends, holidays)

## 2020-09-14 NOTE — Progress Notes (Signed)
Pt refused fetal monitoring this morning. Will continue to encourage patient being placed on fetal monitoring. Toya Smothers, RN

## 2020-09-15 NOTE — Progress Notes (Signed)
[place]) NOTE  Kelly Lane is a 25 y.o. G2P0010 with Estimated Date of Delivery: 11/03/20   By  best clinical estimate [redacted]w[redacted]d  who is admitted for preeclampsia with severe features.    Fetal presentation is cephalic. Length of Stay:  13  Days  Date of admission:09/02/2020  Subjective: Resting comfortably, no acute complaints overnight.  Denies HA/blurry vision, no RUQ pain. Patient reports the fetal movement as active. Patient reports uterine contraction  activity as none. Patient reports  vaginal bleeding as none. Patient describes fluid per vagina as None.  Vitals:  Blood pressure 134/90, pulse 83, temperature 98.2 F (36.8 C), temperature source Oral, resp. rate 18, last menstrual period 01/28/2020, SpO2 100 %, unknown if currently breastfeeding. Vitals:   09/14/20 1519 09/14/20 2019 09/14/20 2327 09/15/20 0624  BP: (!) 148/97 (!) 146/97 (!) 143/100 134/90  Pulse: 88 81 87 83  Resp: 18 18 18 18   Temp: 98.1 F (36.7 C) 98.8 F (37.1 C) 98.3 F (36.8 C) 98.2 F (36.8 C)  TempSrc: Oral Oral Oral Oral  SpO2: 100% 100% 100%    Physical Examination:  General appearance - alert, well appearing, and in no distress Chest - clear to auscultation, no wheezes, rales or rhonchi, symmetric air entry Heart - normal rate and regular rhythm Abdomen - gravid, non-tender Extremities - no calf tenderness, no edema bilaterally Skin - warm and dry  Fetal Monitoring:  Baseline: 140 bpm, Variability: moderate, Accelerations: +accels and Decelerations: Absent   reactive  Labs:  Results for orders placed or performed during the hospital encounter of 09/02/20 (from the past 24 hour(s))  Ferritin   Collection Time: 09/14/20  7:45 AM  Result Value Ref Range   Ferritin 41 11 - 307 ng/mL    Imaging Studies:    [place] MFM FETAL BPP WO NON STRESS  Result Date: 09/13/2020 ----------------------------------------------------------------------  OBSTETRICS REPORT                        (Signed Final 09/13/2020 05:35 pm) ---------------------------------------------------------------------- Patient Info  ID #:       [id number]                          D.O.B.:  1996-02-03 (24 yrs)  Name:       Kelly Lane                   Visit Date: 09/13/2020 03:07 pm ---------------------------------------------------------------------- Performed By  Attending:        Tama High MD        Ref. Address:     Kelly Lane                                                             GYN  Performed By:     Kelly Lane        Secondary Phy.:   [facility]                    [facility],R[facility]  Care  Referred By:      Kelly Lane             [facility]                    [place] ---------------------------------------------------------------------- Orders  #  Description                           Code        Ordered By  1  [place] MFM FETAL BPP WO NON               76819.01    Kelly Lane     STRESS ----------------------------------------------------------------------  #  Order #                     Accession #                Episode #  1  [id number]                   [id number]                 [id number] ---------------------------------------------------------------------- Indications  Hypertension - Chronic with superimposed       O11.9 O10.919  preeclampsia (on Labetolol)  Echogenic intracardiac focus of the heart      O35.8XX0  (EIF)  Uterine fibroids                               O34.10  Rh negative state in antepartum                O36.0190  Negative AFP(Low Risk NIPS)  Cystic Fibrosis (CF) Carrier, second trimester O09.892  [redacted] weeks gestation of pregnancy                Z3A.32 ---------------------------------------------------------------------- Fetal Evaluation  Num Of Fetuses:         1  Fetal Heart Rate(bpm):  147  Cardiac Activity:       Observed  Presentation:            Cephalic  Placenta:               Posterior  P. Cord Insertion:      Previously Visualized  Amniotic Fluid  AFI FV:      Within normal limits  AFI Sum(cm)     %Tile       Largest Pocket(cm)  10.72           23          4.14  RUQ(cm)       RLQ(cm)       LUQ(cm)        LLQ(cm)  1.97          0.6           4.01           4.14 ---------------------------------------------------------------------- Biophysical Evaluation  Amniotic F.V:   Within normal limits       F. Tone:        Observed  F. Movement:    Observed  Score:          8/8  F. Breathing:   Observed ---------------------------------------------------------------------- Lane History  Gravidity:    2         Prem:   0         SAB:   0  TOP:          1       Ectopic:  0        Living: 0 ---------------------------------------------------------------------- Gestational Age  LMP:           32w 5d        Date:  01/28/20                 EDD:   11/03/20  Best:          Kelly Lane 5d     Det. By:  LMP  (01/28/20)          EDD:   11/03/20 ---------------------------------------------------------------------- Anatomy  Cranium:               Appears normal         Diaphragm:              Appears normal  Cavum:                 Appears normal         Stomach:                Appears normal, left                                                                        sided  Ventricles:            Appears normal         Abdomen:                Appears normal  Cerebellum:            Appears normal         Abdominal Wall:         Appears nml (cord                                                                        insert, abd wall)  Posterior Fossa:       Appears normal         Cord Vessels:           Appears normal (3                                                                        vessel cord)  Heart:  Appears normal; EIF    Kidneys:                Appear normal  RVOT:                  Appears normal         Bladder:                Appears normal  ---------------------------------------------------------------------- Impression  Superimposed preeclampsia with severe features.  Amniotic fluid is normal and good fetal activity is seen  .Antenatal testing is reassuring. BPP 8/8. ---------------------------------------------------------------------- Recommendations  -Twice-weekly BPP. ----------------------------------------------------------------------                  Kelly High, MD Electronically Signed Final Report   09/13/2020 05:35 pm ----------------------------------------------------------------------    Medications:  Scheduled . aspirin EC  81 mg Oral Daily  . carbamide peroxide  5 drop Both EARS BID  . labetalol  400 mg Oral TID  . NIFEdipine  60 mg Oral BID  . prenatal vitamin w/FE, FA  1 tablet Oral Q1200  . sodium chloride flush  3 mL Intravenous Q12H   I have reviewed the patient's current medications.  ASSESSMENT/PLAN: G2P0010 [redacted]w[redacted]d Estimated Date of Delivery: 11/03/20  Patient Active Problem List  Admitted due to preeclampsia with severe features 1) Preeclampsia -currently stable, BP well controlled with Procardia 60mg  bid, Labetalol 400mg  tid -labs q 72hr, pt asymptomatic -s/p Mag x 24hr  2) Fetal well being -NICHD- Cat. I, continue q shift monitoring -BPP q 3 days- last completed on 5/12 S/p BMZ 4/27-28 *Rh neg: rhogam UTD.  3) Maternal well being Social: plan is for baby up for adoption. SW consult PRN and PP *History of depression: no current issues.  *PPx: SCDs, activity as tolerated  DISPO: Continue with management as above, plan for delivery @ 34wks or as clinically indicated   Janyth Pupa, DO Attending [place], [place] for [employer], [place]

## 2020-09-16 DIAGNOSIS — Z3A33 33 weeks gestation of pregnancy: Secondary | ICD-10-CM

## 2020-09-16 DIAGNOSIS — O1413 Severe pre-eclampsia, third trimester: Secondary | ICD-10-CM | POA: Diagnosis not present

## 2020-09-16 NOTE — Progress Notes (Signed)
Pt reports HA persists after admin of tylenol. Denies desire for other medications, applied ice pack to head per patient request. VS WNL. States "dizzy" but denies changes to her vision. Denies epigastric pain, no swelling of face hands or extremities. +1 reflexes.  Cardell Peach, RN

## 2020-09-16 NOTE — Progress Notes (Signed)
Patient ID: Kelly Lane, female   DOB: [birth year], 25 y.o.   MRN: [id number] [place]) NOTE  Kelly Lane is a 25 y.o. G2P0010 at [redacted]w[redacted]d  who is admitted for preeclampsia with severe features.    Fetal presentation is cephalic. Length of Stay:  14  Days  Date of admission:09/02/2020  Subjective: Resting comfortably, no acute complaints overnight.  Denies HA/blurry vision, no RUQ pain. Patient reports the fetal movement as active. Patient reports uterine contraction  activity as none. Patient reports  vaginal bleeding as none. Patient describes fluid per vagina as None.  Vitals:  Blood pressure (!) 127/105, pulse 77, temperature 98.2 F (36.8 C), temperature source Oral, resp. rate 16, last menstrual period 01/28/2020, SpO2 99 %, unknown if currently breastfeeding. Vitals:   09/15/20 2349 09/16/20 0130 09/16/20 0443 09/16/20 0505  BP: (!) 144/91 (!) 145/95 (!) 148/101 (!) 127/105  Pulse: 94 93 81 77  Resp:      Temp: 98.4 F (36.9 C)  98.2 F (36.8 C)   TempSrc: Oral  Oral   SpO2: 99%  99%    Physical Examination:  General appearance - alert, well appearing, and in no distress Chest - clear to auscultation, no wheezes, rales or rhonchi, symmetric air entry Heart - normal rate and regular rhythm Abdomen - gravid, non-tender Extremities - no calf tenderness, no edema bilaterally Skin - warm and dry  Fetal Monitoring:  Baseline: 145 bpm, Variability: Good {> 6 bpm), Accelerations: Reactive and Decelerations: Absent  Labs:  No results found for this or any previous visit (from the past 24 hour(s)).  Imaging Studies:    [place] MFM FETAL BPP WO NON STRESS  Result Date: 09/13/2020 ----------------------------------------------------------------------  OBSTETRICS REPORT                       (Signed Final 09/13/2020 05:35 pm) ---------------------------------------------------------------------- Patient Info  ID #:       [id number]                           D.O.B.:  20-Jan-1996 (24 yrs)  Name:       Kelly Lane                   Visit Date: 09/13/2020 03:07 pm ---------------------------------------------------------------------- Performed By  Attending:        Tama High MD        Ref. Address:     [facility]                                                             GYN  Performed By:     Wilnette Kales        Secondary Phy.:   [facility]                    RDMS,RVT                                                             Care  Referred  By:      JENNIFER M             [facility]                    [place] ---------------------------------------------------------------------- Orders  #  Description                           Code        Ordered By  1  [place] MFM FETAL BPP WO NON               69629.52    MICHAEL ERVIN     STRESS ----------------------------------------------------------------------  #  Order #                     Accession #                Episode #  1  [id number]                   [id number]                 [id number] ---------------------------------------------------------------------- Indications  Hypertension - Chronic with superimposed       O11.9 O10.919  preeclampsia (on Labetolol)  Echogenic intracardiac focus of the heart      O35.8XX0  (EIF)  Uterine fibroids                               O34.10  Rh negative state in antepartum                O36.0190  Negative AFP(Low Risk NIPS)  Cystic Fibrosis (CF) Carrier, second trimester O09.892  [redacted] weeks gestation of pregnancy                Z3A.32 ---------------------------------------------------------------------- Fetal Evaluation  Num Of Fetuses:         1  Fetal Heart Rate(bpm):  147  Cardiac Activity:       Observed  Presentation:           Cephalic  Placenta:               Posterior  P. Cord Insertion:      Previously Visualized  Amniotic Fluid  AFI FV:      Within normal limits  AFI Sum(cm)     %Tile       Largest  Pocket(cm)  10.72           23          4.14  RUQ(cm)       RLQ(cm)       LUQ(cm)        LLQ(cm)  1.97          0.6           4.01           4.14 ---------------------------------------------------------------------- Biophysical Evaluation  Amniotic F.V:   Within normal limits       F. Tone:        Observed  F. Movement:    Observed  Score:          8/8  F. Breathing:   Observed ---------------------------------------------------------------------- OB History  Gravidity:    2         Prem:   0         SAB:   0  TOP:          1       Ectopic:  0        Living: 0 ---------------------------------------------------------------------- Gestational Age  LMP:           32w 5d        Date:  01/28/20                 EDD:   11/03/20  Best:          [name] 5d     Det. By:  LMP  (01/28/20)          EDD:   11/03/20 ---------------------------------------------------------------------- Anatomy  Cranium:               Appears normal         Diaphragm:              Appears normal  Cavum:                 Appears normal         Stomach:                Appears normal, left                                                                        sided  Ventricles:            Appears normal         Abdomen:                Appears normal  Cerebellum:            Appears normal         Abdominal Wall:         Appears nml (cord                                                                        insert, abd wall)  Posterior Fossa:       Appears normal         Cord Vessels:           Appears normal (3                                                                        vessel cord)  Heart:  Appears normal; EIF    Kidneys:                Appear normal  RVOT:                  Appears normal         Bladder:                Appears normal ---------------------------------------------------------------------- Impression  Superimposed preeclampsia with severe features.  Amniotic fluid is normal and good fetal  activity is seen  .Antenatal testing is reassuring. BPP 8/8. ---------------------------------------------------------------------- Recommendations  -Twice-weekly BPP. ----------------------------------------------------------------------                  Kelly High, MD Electronically Signed Final Report   09/13/2020 05:35 pm ----------------------------------------------------------------------    Medications:  Scheduled . aspirin EC  81 mg Oral Daily  . carbamide peroxide  5 drop Both EARS BID  . labetalol  400 mg Oral TID  . NIFEdipine  60 mg Oral BID  . prenatal vitamin w/FE, FA  1 tablet Oral Q1200  . sodium chloride flush  3 mL Intravenous Q12H   I have reviewed the patient's current medications.  ASSESSMENT/PLAN: G2P0010 [redacted]w[redacted]d Estimated Date of Delivery: 11/03/20  Patient Active Problem List  Admitted due to preeclampsia with severe features 1) Preeclampsia -currently stable, BP stable with Procardia 60mg  bid, Labetalol 400mg  tid -labs q 72hr, pt asymptomatic -s/p Mag x 24hr - Plan for delivery at 34 weeks if no worsening maternal/fetal status  2) Fetal well being -NICHD- Cat. I, continue q shift monitoring -BPP q 3 days- last completed on 5/12 BPP 8/8 S/p BMZ 4/27-28 *Rh neg: rhogam UTD.  3) Maternal well being Social: plan is for baby up for adoption. SW consult PRN and PP *History of depression: no current issues.  *PPx: SCDs, activity as tolerated  Continue routine antenatal care care

## 2020-09-16 NOTE — Progress Notes (Signed)
RN remained at bedside while patient on FHR monitor. Pt has questions regarding labor/induction process and birth. RN answered multiple questions, encouraged to continue asking questions so that pt can be more prepared for process of IOL. Pt expressed surprise at how long process of IOL may take. 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  Cardell Peach, RN

## 2020-09-17 ENCOUNTER — Inpatient Hospital Stay (HOSPITAL_BASED_OUTPATIENT_CLINIC_OR_DEPARTMENT_OTHER): Payer: BC Managed Care – PPO

## 2020-09-17 DIAGNOSIS — Z141 Cystic fibrosis carrier: Secondary | ICD-10-CM

## 2020-09-17 DIAGNOSIS — D259 Leiomyoma of uterus, unspecified: Secondary | ICD-10-CM

## 2020-09-17 DIAGNOSIS — O3413 Maternal care for benign tumor of corpus uteri, third trimester: Secondary | ICD-10-CM | POA: Diagnosis not present

## 2020-09-17 DIAGNOSIS — O113 Pre-existing hypertension with pre-eclampsia, third trimester: Secondary | ICD-10-CM

## 2020-09-17 DIAGNOSIS — Z3A33 33 weeks gestation of pregnancy: Secondary | ICD-10-CM

## 2020-09-17 DIAGNOSIS — O358XX0 Maternal care for other (suspected) fetal abnormality and damage, not applicable or unspecified: Secondary | ICD-10-CM

## 2020-09-17 DIAGNOSIS — O1413 Severe pre-eclampsia, third trimester: Secondary | ICD-10-CM | POA: Diagnosis not present

## 2020-09-17 LAB — CBC
HCT: 29.3 % — ABNORMAL LOW (ref 36.0–46.0)
Hemoglobin: 10.3 g/dL — ABNORMAL LOW (ref 12.0–15.0)
MCH: 33 pg (ref 26.0–34.0)
MCHC: 35.2 g/dL (ref 30.0–36.0)
MCV: 93.9 fL (ref 80.0–100.0)
Platelets: 200 10*3/uL (ref 150–400)
RBC: 3.12 MIL/uL — ABNORMAL LOW (ref 3.87–5.11)
RDW: 13.2 % (ref 11.5–15.5)
WBC: 6.8 10*3/uL (ref 4.0–10.5)
nRBC: 0 % (ref 0.0–0.2)

## 2020-09-17 LAB — COMPREHENSIVE METABOLIC PANEL
ALT: 13 U/L (ref 0–44)
AST: 28 U/L (ref 15–41)
Albumin: 2.2 g/dL — ABNORMAL LOW (ref 3.5–5.0)
Alkaline Phosphatase: 90 U/L (ref 38–126)
Anion gap: 5 (ref 5–15)
BUN: 9 mg/dL (ref 6–20)
CO2: 22 mmol/L (ref 22–32)
Calcium: 8.1 mg/dL — ABNORMAL LOW (ref 8.9–10.3)
Chloride: 110 mmol/L (ref 98–111)
Creatinine, Ser: 0.81 mg/dL (ref 0.44–1.00)
GFR, Estimated: >60 mL/min (ref >60)
Glucose, Bld: 105 mg/dL — ABNORMAL HIGH (ref 70–99)
Potassium: 4 mmol/L (ref 3.5–5.1)
Sodium: 137 mmol/L (ref 135–145)
Total Bilirubin: 0.7 mg/dL (ref 0.3–1.2)
Total Protein: 4.9 g/dL — ABNORMAL LOW (ref 6.5–8.1)

## 2020-09-17 MED ORDER — LABETALOL HCL 200 MG PO TABS
600.0000 mg | ORAL_TABLET | Freq: Three times a day (TID) | ORAL | Status: DC
  Administered 2020-09-17 – 2020-09-20 (×11): 600 mg via ORAL
  Filled 2020-09-17 (×11): qty 3

## 2020-09-17 NOTE — Progress Notes (Signed)
Kelly Lane is a 25 y.o. G2P0010 with Estimated Date of Delivery: 11/03/20   By  LMP [redacted]w[redacted]d  who is admitted for preeclampsia with severe features  Fetal presentation is cephalic. Length of Stay:  15  Days  Date of admission:09/02/2020  Subjective: Pt resting comfortably, reports no acute complaints.  Denies headache, blurry vision, no RUQ pain. Patient reports the fetal movement as active. Patient reports uterine contraction  activity none Patient reports  vaginal bleeding as none. Patient describes fluid per vagina as None.  Vitals:  Blood pressure (!) 139/97, pulse 80, temperature 98.1 F (36.7 C), temperature source Oral, resp. rate 18, last menstrual period 01/28/2020, SpO2 100 %, unknown if currently breastfeeding. Vitals:   09/16/20 2341 09/17/20 0622 09/17/20 0637 09/17/20 0806  BP: (!) 133/95 (!) 148/110 (!) 144/103 (!) 139/97  Pulse: 82 89  80  Resp: 18 16  18   Temp: 98.3 F (36.8 C) 98.2 F (36.8 C)  98.1 F (36.7 C)  TempSrc: Oral Oral  Oral  SpO2: 100% 98%  100%   Physical Examination:  General appearance - alert, well appearing, and in no distress Mental status - normal mood, behavior, speech, dress, motor activity, and thought processes Chest - clear to auscultation, no wheezes, rales or rhonchi, symmetric air entry Heart - normal rate and regular rhythm Abdomen - gravid and non-tender Extremities - no calf tenderness, no edema bilaterally Skin - warm and dry  Fetal Monitoring:  Baseline: 130 bpm, Variability: moderate, Accelerations: +accels and Decelerations: Absent   reactive  Labs:  Last completed 5/13 Imaging Studies:    [place] MFM FETAL BPP WO NON STRESS  Result Date: 09/13/2020 ----------------------------------------------------------------------  OBSTETRICS REPORT                       (Signed Final 09/13/2020 05:35 pm) ---------------------------------------------------------------------- Patient Info  ID #:        [id number]                          D.O.B.:  18-Nov-1995 (24 yrs)  Name:       Kelly Lane                   Visit Date: 09/13/2020 03:07 pm ---------------------------------------------------------------------- Performed By  Attending:        Tama High MD        Ref. Address:     [facility]                                                             GYN  Performed By:     Wilnette Kales        Secondary Phy.:   [facility]                    RDMS,RVT                                                             Care  Referred By:  Almin Livingstone M             [facility]                    [place] ---------------------------------------------------------------------- Orders  #  Description                           Code        Ordered By  1  [place] MFM FETAL BPP WO NON               [id number]    MICHAEL ERVIN     STRESS ----------------------------------------------------------------------  #  Order #                     Accession #                Episode #  1  [id number]                   [id number]                 [id number] ---------------------------------------------------------------------- Indications  Hypertension - Chronic with superimposed       O11.9 O10.919  preeclampsia (on Labetolol)  Echogenic intracardiac focus of the heart      O35.8XX0  (EIF)  Uterine fibroids                               O34.10  Rh negative state in antepartum                O36.0190  Negative AFP(Low Risk NIPS)  Cystic Fibrosis (CF) Carrier, second trimester O09.892  [redacted] weeks gestation of pregnancy                Z3A.32 ---------------------------------------------------------------------- Fetal Evaluation  Num Of Fetuses:         1  Fetal Heart Rate(bpm):  147  Cardiac Activity:       Observed  Presentation:           Cephalic  Placenta:               Posterior  P. Cord Insertion:      Previously Visualized  Amniotic Fluid  AFI FV:      Within normal  limits  AFI Sum(cm)     %Tile       Largest Pocket(cm)  10.72           23          4.14  RUQ(cm)       RLQ(cm)       LUQ(cm)        LLQ(cm)  1.97          0.6           4.01           4.14 ---------------------------------------------------------------------- Biophysical Evaluation  Amniotic F.V:   Within normal limits       F. Tone:        Observed  F. Movement:    Observed  Score:          8/8  F. Breathing:   Observed ---------------------------------------------------------------------- OB History  Gravidity:    2         Prem:   0         SAB:   0  TOP:          1       Ectopic:  0        Living: 0 ---------------------------------------------------------------------- Gestational Age  LMP:           32w 5d        Date:  01/28/20                 EDD:   11/03/20  Best:          Kelly Lane 5d     Det. By:  LMP  (01/28/20)          EDD:   11/03/20 ---------------------------------------------------------------------- Anatomy  Cranium:               Appears normal         Diaphragm:              Appears normal  Cavum:                 Appears normal         Stomach:                Appears normal, left                                                                        sided  Ventricles:            Appears normal         Abdomen:                Appears normal  Cerebellum:            Appears normal         Abdominal Wall:         Appears nml (cord                                                                        insert, abd wall)  Posterior Fossa:       Appears normal         Cord Vessels:           Appears normal (3                                                                        vessel cord)  Heart:  Appears normal; EIF    Kidneys:                Appear normal  RVOT:                  Appears normal         Bladder:                Appears normal ---------------------------------------------------------------------- Impression  Superimposed preeclampsia with severe features.   Amniotic fluid is normal and good fetal activity is seen  .Antenatal testing is reassuring. BPP 8/8. ---------------------------------------------------------------------- Recommendations  -Twice-weekly BPP. ----------------------------------------------------------------------                  Tama High, MD Electronically Signed Final Report   09/13/2020 05:35 pm ----------------------------------------------------------------------    ASSESSMENT/PLAN: [id number] [redacted]w[redacted]d Estimated Date of Delivery: 11/03/20  Admitted due to preeclampsia with severe features 1) Preeclampsia -BP improved s/p increase of Labetalol to 600mg  tid, continue Procardia 60mg  bid -labs q 72hr-ordered for today -s/p Mag x 24hr, pt remains asymptomatic  2) Fetal well being -NICHD- Cat. I, continue q shift monitoring -BPP q 3 days- BPP to be done today S/p BMZ 4/27-28 *Rh neg: rhogam UTD.  3) Maternal well being Social: she is considering adoption SW consult PRN and PP *History of depression: no current issues.  *PPx: SCDs, activity as tolerated  DISPO: Reviewed management plan with delivery at 34wks if maternal/fetal well being remains reassuring.  Discussed process of IOL- questions and concerns were addressed.   Kelly Lane 09/17/2020,11:35 AM

## 2020-09-18 MED ORDER — BISMUTH SUBSALICYLATE 262 MG/15ML PO SUSP
30.0000 mL | Freq: Four times a day (QID) | ORAL | Status: DC | PRN
  Filled 2020-09-18: qty 236

## 2020-09-18 MED ORDER — FAMOTIDINE 20 MG PO TABS
20.0000 mg | ORAL_TABLET | Freq: Two times a day (BID) | ORAL | Status: DC
  Administered 2020-09-18 – 2020-09-20 (×4): 20 mg via ORAL
  Filled 2020-09-18 (×5): qty 1

## 2020-09-18 MED ORDER — BETAMETHASONE SOD PHOS & ACET 6 (3-3) MG/ML IJ SUSP
12.0000 mg | INTRAMUSCULAR | Status: AC
  Administered 2020-09-19 – 2020-09-20 (×2): 12 mg via INTRAMUSCULAR
  Filled 2020-09-18: qty 5

## 2020-09-18 NOTE — Progress Notes (Signed)
[place]) NOTE  Kelly Lane is a 25 y.o. G2P0010 with Estimated Date of Delivery: 11/03/20   By  best clinical estimate [redacted]w[redacted]d  who is admitted for preeclampsia with severe features.    Fetal presentation is cephalic. Length of Stay:  16  Days  Date of admission:09/02/2020  Subjective: This am she noted mid-upper abdominal pain- reports pain is constant.  Also notes decreased appetite, no nausea or vomiting.  Feels like she has food poisoning- last BM yesterday.  Denies diarrhea.  No other acute complaints Patient reports the fetal movement as active. Patient reports uterine contraction  activity as none. Patient reports  vaginal bleeding as none. Patient describes fluid per vagina as None.  Vitals:  Blood pressure 135/89, pulse 84, temperature 97.9 F (36.6 C), temperature source Oral, resp. rate 18, last menstrual period 01/28/2020, SpO2 98 %, unknown if currently breastfeeding. Vitals:   09/17/20 2339 09/18/20 0507 09/18/20 0508 09/18/20 0752  BP: (!) 134/95 (!) 157/117 (!) 144/98 135/89  Pulse: 83 (!) 111 89 84  Resp: 17  17 18   Temp: 98.3 F (36.8 C)  98.2 F (36.8 C) 97.9 F (36.6 C)  TempSrc: Oral  Oral Oral  SpO2: 99%  99% 98%   Physical Examination:  General appearance - alert, appears mildly uncomfortable Mental status - normal mood, behavior, speech, dress, motor activity, and thought processes Chest - clear to auscultation, no wheezes, rales or rhonchi, symmetric air entry Heart - normal rate and regular rhythm Abdomen - gravid, no rebound, no guarding, no reproducible pain Extremities - no edema, no calf tenderness bilaterally Skin - warm and dry  Fetal Monitoring:  Baseline: 130 bpm, Variability: moderate, Accelerations: +accels and Decelerations: Absent   reactive  Labs:  Results for orders placed or performed during the hospital encounter of 09/02/20 (from the past 24 hour(s))  CBC   Collection Time: 09/17/20 11:53 AM  Result  Value Ref Range   WBC 6.8 4.0 - 10.5 K/uL   RBC 3.12 (L) 3.87 - 5.11 MIL/uL   Hemoglobin 10.3 (L) 12.0 - 15.0 g/dL   HCT 29.3 (L) 36.0 - 46.0 %   MCV 93.9 80.0 - 100.0 fL   MCH 33.0 26.0 - 34.0 pg   MCHC 35.2 30.0 - 36.0 g/dL   RDW 13.2 11.5 - 15.5 %   Platelets 200 150 - 400 K/uL   nRBC 0.0 0.0 - 0.2 %  Comprehensive metabolic panel   Collection Time: 09/17/20 11:53 AM  Result Value Ref Range   Sodium 137 135 - 145 mmol/L   Potassium 4.0 3.5 - 5.1 mmol/L   Chloride 110 98 - 111 mmol/L   CO2 22 22 - 32 mmol/L   Glucose, Bld 105 (H) 70 - 99 mg/dL   BUN 9 6 - 20 mg/dL   Creatinine, Ser 0.81 0.44 - 1.00 mg/dL   Calcium 8.1 (L) 8.9 - 10.3 mg/dL   Total Protein 4.9 (L) 6.5 - 8.1 g/dL   Albumin 2.2 (L) 3.5 - 5.0 g/dL   AST 28 15 - 41 U/L   ALT 13 0 - 44 U/L   Alkaline Phosphatase 90 38 - 126 U/L   Total Bilirubin 0.7 0.3 - 1.2 mg/dL   GFR, Estimated >60 >60 mL/min   Anion gap 5 5 - 15    Imaging Studies:    [place] MFM FETAL BPP WO NON STRESS  Result Date: 09/17/2020 ----------------------------------------------------------------------  OBSTETRICS REPORT                        (  Signed Final 09/17/2020 09:43 pm) ---------------------------------------------------------------------- Patient Info  ID #:       [id number]                          D.O.B.:  03-13-1996 (24 yrs)  Name:       Kelly Lane                   Visit Date: 09/17/2020 08:19 am ---------------------------------------------------------------------- Performed By  Attending:        Sander Nephew      Ref. Address:      [facility]                    MD                                                              GYN  Performed By:     Germain Osgood            Secondary Phy.:    [facility]                    RDMS                                                              Care  Referred By:      Clearnce Sorrel             [facility]                    [place] ---------------------------------------------------------------------- Orders  #  Description                           Code        Ordered By  1  [place] MFM FETAL BPP WO NON               [id number]    CHARLIE PICKENS     STRESS ----------------------------------------------------------------------  #  Order #                     Accession #                Episode #  1  [id number]                   [id number]                 [id number] ---------------------------------------------------------------------- Indications  Hypertension - Chronic with superimposed        O11.9 O10.919  preeclampsia (on Labetolol)  Echogenic intracardiac focus of the heart       O35.8XX0  (EIF)  Uterine fibroids  O34.10  Rh negative state in antepartum                 O36.0190  Negative AFP(Low Risk NIPS)  Cystic Fibrosis (CF) Carrier, second trimester  O09.892  [redacted] weeks gestation of pregnancy                 Z3A.33 ---------------------------------------------------------------------- Fetal Evaluation  Num Of Fetuses:          1  Fetal Heart Rate(bpm):   131  Cardiac Activity:        Observed  Presentation:            Cephalic  Placenta:                Posterior  P. Cord Insertion:       Previously Visualized  Amniotic Fluid  AFI FV:      Within normal limits  AFI Sum(cm)     %Tile       Largest Pocket(cm)  9.38            13          3.8  RUQ(cm)       RLQ(cm)       LUQ(cm)        LLQ(cm)  0.95          1.58          3.8            3.05 ---------------------------------------------------------------------- Biophysical Evaluation  Amniotic F.V:   Pocket => 2 cm             F. Tone:         Observed  F. Movement:    Observed                   Score:           8/8  F. Breathing:   Observed ---------------------------------------------------------------------- Biometry  LV:        2.2  mm ---------------------------------------------------------------------- OB History  Gravidity:    2         Prem:   0          SAB:   0  TOP:          1       Ectopic:  0        Living: 0 ---------------------------------------------------------------------- Gestational Age  LMP:           33w 2d        Date:  01/28/20                 EDD:   11/03/20  Best:          33w 2d     Det. By:  LMP  (01/28/20)          EDD:   11/03/20 ---------------------------------------------------------------------- Anatomy  Ventricles:            Appears normal         Abdomen:                Appears normal  Heart:                 Previously             Kidneys:                Appear normal  visualizedl; EIF  Diaphragm:             Appears normal         Bladder:                Appears normal  Stomach:               Appears normal, left                         sided ---------------------------------------------------------------------- Cervix Uterus Adnexa  Cervix  Not visualized (advanced GA >24wks)  Uterus  No abnormality visualized.  Right Ovary  Not visualized.  Left Ovary  Not visualized.  Cul De Sac  No free fluid seen.  Adnexa  No adnexal mass visualized. ---------------------------------------------------------------------- Impression  Antenatal testing performed given maternal chronic  hypertension on medication.  The biophysical profile was 8/8 with good fetal movement and  amniotic fluid volume. ---------------------------------------------------------------------- Recommendations  Continue weekly testin. ----------------------------------------------------------------------               Sander Nephew, MD Electronically Signed Final Report   09/17/2020 09:43 pm ----------------------------------------------------------------------    Medications:  Scheduled . aspirin EC  81 mg Oral Daily  . carbamide peroxide  5 drop Both EARS BID  . labetalol  600 mg Oral TID  . NIFEdipine  60 mg Oral BID  . prenatal vitamin w/FE, FA  1 tablet Oral Q1200  . sodium chloride flush  3 mL Intravenous Q12H   I have reviewed the  patient's current medications.  ASSESSMENT/PLAN: G2P0010 [redacted]w[redacted]d Estimated Date of Delivery: 11/03/20 admitted due to preeclampsia with severe features  1) Preeclampsia with severe features -BP controlled with current regimen- Labetalol to 600mg  tid, Procardia 60mg  bid -labs q 72hr, slight increase in Cr noted, will continue to monitor, next labs 5/19 or if clinically indicated -plan to monitor I/Os -s/p Mag x 24hr  2) Fetal well being -NICHD- Cat. I, continue q shift monitoring -BPP q 3 days- last completed on 5/16 S/p BMZ 4/27-28, []  plan for repeat this week prior to induction *Rh neg  3) Maternal well being Social: she is considering adoption SW consult PRN and PP *History of depression: no current issues.  *PPx: SCDs, activity as tolerated  DISPO: Plan for delivery at 34wks if maternal/fetal well being remains reassuring. Pt requesting NICU consult again- Dr. Barbaraann Rondo notified.  Clearnce Sorrel Joniel Graumann 09/18/2020,9:47 AM

## 2020-09-18 NOTE — Progress Notes (Signed)
Patient very focused on what she believes is GI related discomfort. BM x 2 yesterday, Dr. Nelda Marseille notified that patient requesting meds for "staomach ache" something to coat her stomach. Patient also questioned why she was started on I&Os.Marland Kitchenexplained to watch how her kidneys were functioning.

## 2020-09-19 ENCOUNTER — Other Ambulatory Visit: Payer: Self-pay | Admitting: Advanced Practice Midwife

## 2020-09-19 NOTE — Progress Notes (Signed)
[place]) NOTE  Kelly Lane is a 25 y.o. G2P0010 with Estimated Date of Delivery: 11/03/20   By  best clinical estimate [redacted]w[redacted]d  who is admitted for preeclampsia with severe features.    Fetal presentation is cephalic. Length of Stay:  17  Days  Date of admission:09/02/2020  Subjective: Reports no acute complaints today- states prior abdominal pain has resolved.  Denies headache, blurry vision or RUQ pain.   Patient reports the fetal movement as active. Patient reports uterine contraction  activity as none. Patient reports  vaginal bleeding as none. Patient describes fluid per vagina as None.  Vitals:  Blood pressure (!) 137/98, pulse 79, temperature 98 F (36.7 C), temperature source Oral, resp. rate 16, last menstrual period 01/28/2020, SpO2 100 %, unknown if currently breastfeeding. Vitals:   09/18/20 2312 09/19/20 0422 09/19/20 0744 09/19/20 0800  BP: 136/88 127/82 (!) 145/103 (!) 137/98  Pulse: 88 86 73 79  Resp: 16 16 16    Temp: 98.1 F (36.7 C) 98.5 F (36.9 C) 98 F (36.7 C)   TempSrc: Oral Oral Oral   SpO2: 100% 98% 98% 100%   Physical Examination:  General appearance - alert, well appearing, and in no distress Mental status - normal mood, behavior, speech, dress, motor activity, and thought processes Chest - CTAB Heart - normal rate and regular rhythm Abdomen - gravid, soft and non-tender Extremities - no pedal edema noted, no calf tenderness bilaterally Skin - warm and dry   Fetal Monitoring:  Baseline: 135 bpm, Variability: moderate, Accelerations: +accels, and Decelerations: Absent    reactive  Labs:  No results found for this or any previous visit (from the past 24 hour(s)).  Imaging Studies:    [place] MFM FETAL BPP WO NON STRESS  Result Date: 09/17/2020 ----------------------------------------------------------------------  OBSTETRICS REPORT                        (Signed Final 09/17/2020 09:43 pm)  ---------------------------------------------------------------------- Patient Info  ID #:       [id number]                          D.O.B.:  06-20-1995 (24 yrs)  Name:       Kelly Lane                   Visit Date: 09/17/2020 08:19 am ---------------------------------------------------------------------- Performed By  Attending:        Sander Nephew      Ref. Address:      [facility]                    MD                                                              GYN  Performed By:     Germain Osgood            Secondary Phy.:    [facility]                    RDMS  Care  Referred By:      Clearnce Sorrel             [facility]                    [place] ---------------------------------------------------------------------- Orders  #  Description                           Code        Ordered By  1  [place] MFM FETAL BPP WO NON               62376.28    CHARLIE PICKENS     STRESS ----------------------------------------------------------------------  #  Order #                     Accession #                Episode #  1  [id number]                   [id number]                 [id number] ---------------------------------------------------------------------- Indications  Hypertension - Chronic with superimposed        O11.9 O10.919  preeclampsia (on Labetolol)  Echogenic intracardiac focus of the heart       O35.8XX0  (EIF)  Uterine fibroids                                O34.10  Rh negative state in antepartum                 O36.0190  Negative AFP(Low Risk NIPS)  Cystic Fibrosis (CF) Carrier, second trimester  O09.892  [redacted] weeks gestation of pregnancy                 Z3A.33 ---------------------------------------------------------------------- Fetal Evaluation  Num Of Fetuses:          1  Fetal Heart Rate(bpm):   131  Cardiac Activity:        Observed  Presentation:            Cephalic   Placenta:                Posterior  P. Cord Insertion:       Previously Visualized  Amniotic Fluid  AFI FV:      Within normal limits  AFI Sum(cm)     %Tile       Largest Pocket(cm)  9.38            13          3.8  RUQ(cm)       RLQ(cm)       LUQ(cm)        LLQ(cm)  0.95          1.58          3.8            3.05 ---------------------------------------------------------------------- Biophysical Evaluation  Amniotic F.V:   Pocket => 2 cm  F. Tone:         Observed  F. Movement:    Observed                   Score:           8/8  F. Breathing:   Observed ---------------------------------------------------------------------- Biometry  LV:        2.2  mm ---------------------------------------------------------------------- OB History  Gravidity:    2         Prem:   0         SAB:   0  TOP:          1       Ectopic:  0        Living: 0 ---------------------------------------------------------------------- Gestational Age  LMP:           33w 2d        Date:  01/28/20                 EDD:   11/03/20  Best:          33w 2d     Det. By:  LMP  (01/28/20)          EDD:   11/03/20 ---------------------------------------------------------------------- Anatomy  Ventricles:            Appears normal         Abdomen:                Appears normal  Heart:                 Previously             Kidneys:                Appear normal                         visualizedl; EIF  Diaphragm:             Appears normal         Bladder:                Appears normal  Stomach:               Appears normal, left                         sided ---------------------------------------------------------------------- Cervix Uterus Adnexa  Cervix  Not visualized (advanced GA >24wks)  Uterus  No abnormality visualized.  Right Ovary  Not visualized.  Left Ovary  Not visualized.  Cul De Sac  No free fluid seen.  Adnexa  No adnexal mass visualized. ---------------------------------------------------------------------- Impression  Antenatal  testing performed given maternal chronic  hypertension on medication.  The biophysical profile was 8/8 with good fetal movement and  amniotic fluid volume. ---------------------------------------------------------------------- Recommendations  Continue weekly testin. ----------------------------------------------------------------------               Sander Nephew, MD Electronically Signed Final Report   09/17/2020 09:43 pm ----------------------------------------------------------------------    Medications:  Scheduled . aspirin EC  81 mg Oral Daily  . betamethasone acetate-betamethasone sodium phosphate  12 mg Intramuscular Q24 Hr x 2  . carbamide peroxide  5 drop Both EARS BID  . famotidine  20 mg Oral BID  . labetalol  600 mg Oral TID  . NIFEdipine  60 mg Oral BID  . prenatal vitamin w/FE, FA  1 tablet Oral Q1200  .  sodium chloride flush  3 mL Intravenous Q12H   I have reviewed the patient's current medications.  ASSESSMENT: G2P0010 [redacted]w[redacted]d Estimated Date of Delivery: 11/03/20  Admitted due to preeclampsia with severe features  1) Preeclampsia with severe features -BP remains stable- Labetalol to 600mg  tid,Procardia 60mg  bid -labs q 72hr, next labs 5/19 or if clinically indicated -monitoring I/Os, UOP appropriate -s/p Mag x 24hr  2) Fetal well being -NICHD- Cat. I, continue q shift monitoring -BPP q 3 days-last completed on 5/16 S/p BMZ 4/27-28, -plan to repeat today- Wed/Thurs. *Rh neg  3) Maternal well being -stomach pain has now resolved Social:she is considering adoption SW consult PRN and postpartum *History of depression: no current issues.  *PPx: SCDs, activity as tolerated  DISPO: Plan for delivery at 34wks if maternal/fetal well being remains reassuring- set up for IOL on Saturday midnight- cytotec  Janyth Pupa, DO Attending [place], [place] for [facility], [place]

## 2020-09-20 ENCOUNTER — Inpatient Hospital Stay (HOSPITAL_COMMUNITY): Payer: BC Managed Care – PPO | Admitting: Anesthesiology

## 2020-09-20 ENCOUNTER — Encounter (HOSPITAL_COMMUNITY): Payer: Self-pay | Admitting: Obstetrics and Gynecology

## 2020-09-20 ENCOUNTER — Inpatient Hospital Stay (HOSPITAL_BASED_OUTPATIENT_CLINIC_OR_DEPARTMENT_OTHER): Payer: BC Managed Care – PPO

## 2020-09-20 ENCOUNTER — Encounter (HOSPITAL_COMMUNITY)
Admission: AD | Disposition: A | Discharge status: Home / Self Care | Payer: Self-pay | Source: Home / Self Care | Attending: Obstetrics and Gynecology

## 2020-09-20 DIAGNOSIS — Z98891 History of uterine scar from previous surgery: Secondary | ICD-10-CM

## 2020-09-20 DIAGNOSIS — O1414 Severe pre-eclampsia complicating childbirth: Secondary | ICD-10-CM

## 2020-09-20 DIAGNOSIS — Z3A33 33 weeks gestation of pregnancy: Secondary | ICD-10-CM

## 2020-09-20 LAB — CBC
HCT: 31.7 % — ABNORMAL LOW (ref 36.0–46.0)
Hemoglobin: 11.2 g/dL — ABNORMAL LOW (ref 12.0–15.0)
MCH: 32.6 pg (ref 26.0–34.0)
MCHC: 35.3 g/dL (ref 30.0–36.0)
MCV: 92.2 fL (ref 80.0–100.0)
Platelets: 218 10*3/uL (ref 150–400)
RBC: 3.44 MIL/uL — ABNORMAL LOW (ref 3.87–5.11)
RDW: 12.9 % (ref 11.5–15.5)
WBC: 10.2 10*3/uL (ref 4.0–10.5)
nRBC: 0 % (ref 0.0–0.2)

## 2020-09-20 LAB — TYPE AND SCREEN
ABO/RH(D): AB NEG
Antibody Screen: POSITIVE

## 2020-09-20 LAB — COMPREHENSIVE METABOLIC PANEL
ALT: 13 U/L (ref 0–44)
AST: 21 U/L (ref 15–41)
Albumin: 2.4 g/dL — ABNORMAL LOW (ref 3.5–5.0)
Alkaline Phosphatase: 97 U/L (ref 38–126)
Anion gap: 9 (ref 5–15)
BUN: 11 mg/dL (ref 6–20)
CO2: 19 mmol/L — ABNORMAL LOW (ref 22–32)
Calcium: 8.4 mg/dL — ABNORMAL LOW (ref 8.9–10.3)
Chloride: 109 mmol/L (ref 98–111)
Creatinine, Ser: 0.96 mg/dL (ref 0.44–1.00)
GFR, Estimated: >60 mL/min (ref >60)
Glucose, Bld: 96 mg/dL (ref 70–99)
Potassium: 4.1 mmol/L (ref 3.5–5.1)
Sodium: 137 mmol/L (ref 135–145)
Total Bilirubin: 0.9 mg/dL (ref 0.3–1.2)
Total Protein: 5.5 g/dL — ABNORMAL LOW (ref 6.5–8.1)

## 2020-09-20 LAB — CULTURE, BETA STREP (GROUP B ONLY)

## 2020-09-20 SURGERY — Surgical Case
Anesthesia: Spinal

## 2020-09-20 MED ORDER — NALBUPHINE HCL 10 MG/ML IJ SOLN: 5.0000 mg | INTRAMUSCULAR | Status: DC | PRN

## 2020-09-20 MED ORDER — SENNOSIDES-DOCUSATE SODIUM 8.6-50 MG PO TABS
2.0000 | ORAL_TABLET | Freq: Every day | ORAL | Status: DC
  Administered 2020-09-21 – 2020-09-23 (×3): 2 via ORAL
  Filled 2020-09-20 (×3): qty 2

## 2020-09-20 MED ORDER — ACETAMINOPHEN 500 MG PO TABS
1000.0000 mg | ORAL_TABLET | Freq: Four times a day (QID) | ORAL | Status: DC
  Administered 2020-09-21: 1000 mg via ORAL
  Filled 2020-09-20 (×5): qty 2

## 2020-09-20 MED ORDER — NALOXONE HCL 0.4 MG/ML IJ SOLN: 0.4000 mg | INTRAMUSCULAR | Status: DC | PRN

## 2020-09-20 MED ORDER — ZOLPIDEM TARTRATE 5 MG PO TABS: 5.0000 mg | ORAL_TABLET | Freq: Every evening | ORAL | Status: DC | PRN

## 2020-09-20 MED ORDER — ONDANSETRON HCL 4 MG/2ML IJ SOLN: 4.0000 mg | Freq: Three times a day (TID) | INTRAMUSCULAR | Status: DC | PRN

## 2020-09-20 MED ORDER — NALBUPHINE HCL 10 MG/ML IJ SOLN: 5.0000 mg | Freq: Once | INTRAMUSCULAR | Status: DC | PRN

## 2020-09-20 MED ORDER — PHENYLEPHRINE HCL (PRESSORS) 10 MG/ML IV SOLN
INTRAVENOUS | Status: DC | PRN
  Administered 2020-09-20 (×3): 80 ug via INTRAVENOUS

## 2020-09-20 MED ORDER — PRENATAL MULTIVITAMIN CH
1.0000 | ORAL_TABLET | Freq: Every day | ORAL | Status: DC
  Administered 2020-09-21 – 2020-09-23 (×3): 1 via ORAL
  Filled 2020-09-20 (×3): qty 1

## 2020-09-20 MED ORDER — PENICILLIN G POT IN DEXTROSE 60000 UNIT/ML IV SOLN
3.0000 10*6.[IU] | INTRAVENOUS | Status: DC
  Filled 2020-09-20: qty 50

## 2020-09-20 MED ORDER — MAGNESIUM SULFATE 40 GM/1000ML IV SOLN
2.0000 g/h | INTRAVENOUS | Status: DC
  Administered 2020-09-20: 2 g/h via INTRAVENOUS
  Filled 2020-09-20: qty 1000

## 2020-09-20 MED ORDER — FENTANYL CITRATE (PF) 100 MCG/2ML IJ SOLN: 25.0000 ug | INTRAMUSCULAR | Status: DC | PRN

## 2020-09-20 MED ORDER — LIDOCAINE HCL (PF) 1 % IJ SOLN: 30.0000 mL | INTRAMUSCULAR | Status: DC | PRN

## 2020-09-20 MED ORDER — OXYCODONE-ACETAMINOPHEN 5-325 MG PO TABS: 1.0000 | ORAL_TABLET | ORAL | Status: DC | PRN

## 2020-09-20 MED ORDER — LABETALOL HCL 5 MG/ML IV SOLN: 40.0000 mg | INTRAVENOUS | Status: DC | PRN

## 2020-09-20 MED ORDER — DEXAMETHASONE SODIUM PHOSPHATE 4 MG/ML IJ SOLN
INTRAMUSCULAR | Status: DC | PRN
  Administered 2020-09-20: 4 mg via INTRAVENOUS

## 2020-09-20 MED ORDER — DIPHENHYDRAMINE HCL 25 MG PO CAPS: 25.0000 mg | ORAL_CAPSULE | ORAL | Status: DC | PRN

## 2020-09-20 MED ORDER — LACTATED RINGERS IV BOLUS
500.0000 mL | Freq: Once | INTRAVENOUS | Status: AC
  Administered 2020-09-20: 500 mL via INTRAVENOUS

## 2020-09-20 MED ORDER — COCONUT OIL OIL: 1.0000 "application " | TOPICAL_OIL | Status: DC | PRN

## 2020-09-20 MED ORDER — SODIUM CHLORIDE 0.9 % IV SOLN
5.0000 10*6.[IU] | Freq: Once | INTRAVENOUS | Status: AC
  Administered 2020-09-20: 5 10*6.[IU] via INTRAVENOUS
  Filled 2020-09-20: qty 5

## 2020-09-20 MED ORDER — IBUPROFEN 600 MG PO TABS
600.0000 mg | ORAL_TABLET | Freq: Four times a day (QID) | ORAL | Status: DC
  Administered 2020-09-22 – 2020-09-23 (×5): 600 mg via ORAL
  Filled 2020-09-20 (×6): qty 1

## 2020-09-20 MED ORDER — OXYCODONE-ACETAMINOPHEN 5-325 MG PO TABS: 2.0000 | ORAL_TABLET | ORAL | Status: DC | PRN

## 2020-09-20 MED ORDER — LABETALOL HCL 5 MG/ML IV SOLN: 20.0000 mg | INTRAVENOUS | Status: DC | PRN

## 2020-09-20 MED ORDER — KETOROLAC TROMETHAMINE 30 MG/ML IJ SOLN
30.0000 mg | Freq: Four times a day (QID) | INTRAMUSCULAR | Status: AC
  Administered 2020-09-21 (×3): 30 mg via INTRAVENOUS
  Filled 2020-09-20 (×3): qty 1

## 2020-09-20 MED ORDER — LACTATED RINGERS IV SOLN: INTRAVENOUS | Status: DC

## 2020-09-20 MED ORDER — OXYTOCIN BOLUS FROM INFUSION: 333.0000 mL | Freq: Once | INTRAVENOUS | Status: DC

## 2020-09-20 MED ORDER — NALBUPHINE HCL 10 MG/ML IJ SOLN
5.0000 mg | Freq: Once | INTRAMUSCULAR | Status: DC | PRN
Start: 2020-09-20 — End: 2020-09-23

## 2020-09-20 MED ORDER — OXYTOCIN-SODIUM CHLORIDE 30-0.9 UT/500ML-% IV SOLN
INTRAVENOUS | Status: DC | PRN
  Administered 2020-09-20: 500 mL via INTRAVENOUS

## 2020-09-20 MED ORDER — MORPHINE SULFATE (PF) 0.5 MG/ML IJ SOLN
INTRAMUSCULAR | Status: DC | PRN
  Administered 2020-09-20: .15 mg via EPIDURAL

## 2020-09-20 MED ORDER — TERBUTALINE SULFATE 1 MG/ML IJ SOLN
0.2500 mg | Freq: Once | INTRAMUSCULAR | Status: AC | PRN
  Administered 2020-09-20: 0.25 mg via SUBCUTANEOUS
  Filled 2020-09-20: qty 1

## 2020-09-20 MED ORDER — KETOROLAC TROMETHAMINE 30 MG/ML IJ SOLN: 30.0000 mg | Freq: Four times a day (QID) | INTRAMUSCULAR | Status: DC | PRN

## 2020-09-20 MED ORDER — HYDROMORPHONE HCL 1 MG/ML IJ SOLN
1.0000 mg | INTRAMUSCULAR | Status: DC | PRN
Start: 2020-09-20 — End: 2020-09-23

## 2020-09-20 MED ORDER — SODIUM CHLORIDE 0.9% FLUSH: 3.0000 mL | INTRAVENOUS | Status: DC | PRN

## 2020-09-20 MED ORDER — SIMETHICONE 80 MG PO CHEW: 80.0000 mg | CHEWABLE_TABLET | ORAL | Status: DC | PRN

## 2020-09-20 MED ORDER — DIPHENHYDRAMINE HCL 25 MG PO CAPS: 25.0000 mg | ORAL_CAPSULE | Freq: Four times a day (QID) | ORAL | Status: DC | PRN

## 2020-09-20 MED ORDER — LACTATED RINGERS IV SOLN: INTRAVENOUS | Status: DC | PRN

## 2020-09-20 MED ORDER — GABAPENTIN 300 MG PO CAPS
300.0000 mg | ORAL_CAPSULE | Freq: Two times a day (BID) | ORAL | Status: DC
  Administered 2020-09-20 – 2020-09-23 (×6): 300 mg via ORAL
  Filled 2020-09-20 (×6): qty 1

## 2020-09-20 MED ORDER — SODIUM CHLORIDE 0.9 % IV SOLN: 5.0000 10*6.[IU] | Freq: Once | INTRAVENOUS | Status: DC

## 2020-09-20 MED ORDER — CEFAZOLIN SODIUM-DEXTROSE 2-3 GM-%(50ML) IV SOLR
INTRAVENOUS | Status: DC | PRN
  Administered 2020-09-20: 2 g via INTRAVENOUS

## 2020-09-20 MED ORDER — SOD CITRATE-CITRIC ACID 500-334 MG/5ML PO SOLN
30.0000 mL | ORAL | Status: DC | PRN
  Administered 2020-09-20: 30 mL via ORAL
  Filled 2020-09-20: qty 15

## 2020-09-20 MED ORDER — FENTANYL CITRATE (PF) 100 MCG/2ML IJ SOLN
INTRAMUSCULAR | Status: DC | PRN
  Administered 2020-09-20: 15 ug via INTRATHECAL

## 2020-09-20 MED ORDER — MAGNESIUM SULFATE 40 GM/1000ML IV SOLN
2.0000 g/h | INTRAVENOUS | Status: AC
  Administered 2020-09-21: 2 g/h via INTRAVENOUS
  Filled 2020-09-20: qty 1000

## 2020-09-20 MED ORDER — ONDANSETRON HCL 4 MG/2ML IJ SOLN
INTRAMUSCULAR | Status: DC | PRN
  Administered 2020-09-20: 4 mg via INTRAVENOUS

## 2020-09-20 MED ORDER — HYDRALAZINE HCL 20 MG/ML IJ SOLN: 10.0000 mg | INTRAMUSCULAR | Status: DC | PRN

## 2020-09-20 MED ORDER — PHENYLEPHRINE HCL-NACL 20-0.9 MG/250ML-% IV SOLN
INTRAVENOUS | Status: DC | PRN
  Administered 2020-09-20: 60 ug/min via INTRAVENOUS

## 2020-09-20 MED ORDER — PHENYLEPHRINE HCL-NACL 20-0.9 MG/250ML-% IV SOLN
INTRAVENOUS | Status: AC
  Filled 2020-09-20: qty 250

## 2020-09-20 MED ORDER — OXYTOCIN-SODIUM CHLORIDE 30-0.9 UT/500ML-% IV SOLN: 2.5000 [IU]/h | INTRAVENOUS | Status: AC

## 2020-09-20 MED ORDER — MAGNESIUM HYDROXIDE 400 MG/5ML PO SUSP
30.0000 mL | ORAL | Status: DC | PRN
  Administered 2020-09-22: 30 mL via ORAL
  Filled 2020-09-20: qty 30

## 2020-09-20 MED ORDER — LACTATED RINGERS IV SOLN
500.0000 mL | INTRAVENOUS | Status: DC | PRN
  Administered 2020-09-20: 500 mL via INTRAVENOUS

## 2020-09-20 MED ORDER — ENOXAPARIN SODIUM 40 MG/0.4ML IJ SOSY
40.0000 mg | PREFILLED_SYRINGE | INTRAMUSCULAR | Status: DC
  Administered 2020-09-21 – 2020-09-22 (×2): 40 mg via SUBCUTANEOUS
  Filled 2020-09-20 (×2): qty 0.4

## 2020-09-20 MED ORDER — LABETALOL HCL 5 MG/ML IV SOLN: 80.0000 mg | INTRAVENOUS | Status: DC | PRN

## 2020-09-20 MED ORDER — MISOPROSTOL 50MCG HALF TABLET
50.0000 ug | ORAL_TABLET | ORAL | Status: DC | PRN
  Administered 2020-09-20: 50 ug via ORAL
  Filled 2020-09-20: qty 1

## 2020-09-20 MED ORDER — DIPHENHYDRAMINE HCL 50 MG/ML IJ SOLN: 12.5000 mg | INTRAMUSCULAR | Status: DC | PRN

## 2020-09-20 MED ORDER — OXYTOCIN-SODIUM CHLORIDE 30-0.9 UT/500ML-% IV SOLN
INTRAVENOUS | Status: AC
  Filled 2020-09-20: qty 500

## 2020-09-20 MED ORDER — DIBUCAINE (PERIANAL) 1 % EX OINT: 1.0000 "application " | TOPICAL_OINTMENT | CUTANEOUS | Status: DC | PRN

## 2020-09-20 MED ORDER — ACETAMINOPHEN 325 MG PO TABS: 650.0000 mg | ORAL_TABLET | ORAL | Status: DC | PRN

## 2020-09-20 MED ORDER — DEXAMETHASONE SODIUM PHOSPHATE 4 MG/ML IJ SOLN
INTRAMUSCULAR | Status: AC
  Filled 2020-09-20: qty 1

## 2020-09-20 MED ORDER — OXYCODONE HCL 5 MG PO TABS: 5.0000 mg | ORAL_TABLET | ORAL | Status: DC | PRN

## 2020-09-20 MED ORDER — FENTANYL CITRATE (PF) 100 MCG/2ML IJ SOLN
INTRAMUSCULAR | Status: AC
  Filled 2020-09-20: qty 2

## 2020-09-20 MED ORDER — MEASLES, MUMPS & RUBELLA VAC IJ SOLR: 0.5000 mL | Freq: Once | INTRAMUSCULAR | Status: DC

## 2020-09-20 MED ORDER — OXYTOCIN-SODIUM CHLORIDE 30-0.9 UT/500ML-% IV SOLN: 2.5000 [IU]/h | INTRAVENOUS | Status: DC

## 2020-09-20 MED ORDER — TETANUS-DIPHTH-ACELL PERTUSSIS 5-2.5-18.5 LF-MCG/0.5 IM SUSY: 0.5000 mL | PREFILLED_SYRINGE | Freq: Once | INTRAMUSCULAR | Status: DC

## 2020-09-20 MED ORDER — NALOXONE HCL 4 MG/10ML IJ SOLN
1.0000 ug/kg/h | INTRAVENOUS | Status: DC | PRN
  Filled 2020-09-20: qty 5

## 2020-09-20 MED ORDER — MENTHOL 3 MG MT LOZG: 1.0000 | LOZENGE | OROMUCOSAL | Status: DC | PRN

## 2020-09-20 MED ORDER — BUPIVACAINE IN DEXTROSE 0.75-8.25 % IT SOLN
INTRATHECAL | Status: DC | PRN
  Administered 2020-09-20: 1.5 mL via INTRATHECAL

## 2020-09-20 MED ORDER — FERROUS SULFATE 325 (65 FE) MG PO TABS
325.0000 mg | ORAL_TABLET | Freq: Two times a day (BID) | ORAL | Status: DC
  Administered 2020-09-21 – 2020-09-23 (×4): 325 mg via ORAL
  Filled 2020-09-20 (×4): qty 1

## 2020-09-20 MED ORDER — PHENYLEPHRINE 40 MCG/ML (10ML) SYRINGE FOR IV PUSH (FOR BLOOD PRESSURE SUPPORT)
PREFILLED_SYRINGE | INTRAVENOUS | Status: AC
  Filled 2020-09-20: qty 10

## 2020-09-20 MED ORDER — PENICILLIN G POT IN DEXTROSE 60000 UNIT/ML IV SOLN: 3.0000 10*6.[IU] | INTRAVENOUS | Status: DC

## 2020-09-20 MED ORDER — MAGNESIUM SULFATE BOLUS VIA INFUSION
4.0000 g | Freq: Once | INTRAVENOUS | Status: AC
  Administered 2020-09-20: 4 g via INTRAVENOUS
  Filled 2020-09-20: qty 1000

## 2020-09-20 MED ORDER — ONDANSETRON HCL 4 MG/2ML IJ SOLN: 4.0000 mg | Freq: Four times a day (QID) | INTRAMUSCULAR | Status: DC | PRN

## 2020-09-20 MED ORDER — WITCH HAZEL-GLYCERIN EX PADS: 1.0000 "application " | MEDICATED_PAD | CUTANEOUS | Status: DC | PRN

## 2020-09-20 MED ORDER — ONDANSETRON HCL 4 MG/2ML IJ SOLN
INTRAMUSCULAR | Status: AC
  Filled 2020-09-20: qty 2

## 2020-09-20 MED ORDER — MEPERIDINE HCL 25 MG/ML IJ SOLN: 6.2500 mg | INTRAMUSCULAR | Status: DC | PRN

## 2020-09-20 MED ORDER — SODIUM CHLORIDE 0.9 % IR SOLN
Status: DC | PRN
  Administered 2020-09-20: 1

## 2020-09-20 MED ORDER — LACTATED RINGERS IV SOLN
INTRAVENOUS | Status: DC
  Administered 2020-09-21: 75 mL/h via INTRAVENOUS

## 2020-09-20 MED ORDER — MORPHINE SULFATE (PF) 0.5 MG/ML IJ SOLN
INTRAMUSCULAR | Status: AC
  Filled 2020-09-20: qty 10

## 2020-09-20 SURGICAL SUPPLY — 31 items
CHLORAPREP W/TINT 26ML (MISCELLANEOUS) ×2 IMPLANT
CLAMP CORD UMBIL (MISCELLANEOUS) IMPLANT
CLOTH BEACON ORANGE TIMEOUT ST (SAFETY) ×2 IMPLANT
DRSG OPSITE POSTOP 4X10 (GAUZE/BANDAGES/DRESSINGS) ×2 IMPLANT
ELECT REM PT RETURN 9FT ADLT (ELECTROSURGICAL) ×2
ELECTRODE REM PT RTRN 9FT ADLT (ELECTROSURGICAL) ×1 IMPLANT
EXTRACTOR VACUUM M CUP 4 TUBE (SUCTIONS) IMPLANT
GLOVE BIOGEL PI IND STRL 7.0 (GLOVE) ×3 IMPLANT
GLOVE BIOGEL PI INDICATOR 7.0 (GLOVE) ×3
GLOVE ECLIPSE 7.0 STRL STRAW (GLOVE) ×2 IMPLANT
GOWN STRL REUS W/TWL LRG LVL3 (GOWN DISPOSABLE) ×4 IMPLANT
KIT ABG SYR 3ML LUER SLIP (SYRINGE) IMPLANT
NEEDLE HYPO 22GX1.5 SAFETY (NEEDLE) ×2 IMPLANT
NEEDLE HYPO 25X5/8 SAFETYGLIDE (NEEDLE) ×2 IMPLANT
NS IRRIG 1000ML POUR BTL (IV SOLUTION) ×2 IMPLANT
PACK C SECTION WH (CUSTOM PROCEDURE TRAY) ×2 IMPLANT
PAD ABD 7.5X8 STRL (GAUZE/BANDAGES/DRESSINGS) ×2 IMPLANT
PAD OB MATERNITY 4.3X12.25 (PERSONAL CARE ITEMS) ×2 IMPLANT
PENCIL SMOKE EVAC W/HOLSTER (ELECTROSURGICAL) ×2 IMPLANT
RTRCTR C-SECT PINK 25CM LRG (MISCELLANEOUS) IMPLANT
SPONGE GAUZE 4X4 12PLY STER LF (GAUZE/BANDAGES/DRESSINGS) ×4 IMPLANT
SUT PDS AB 0 CTX 36 PDP370T (SUTURE) IMPLANT
SUT PLAIN 2 0 XLH (SUTURE) IMPLANT
SUT VIC AB 0 CTX 36 (SUTURE) ×2
SUT VIC AB 0 CTX36XBRD ANBCTRL (SUTURE) ×2 IMPLANT
SUT VIC AB 2-0 CT1 (SUTURE) ×2 IMPLANT
SUT VIC AB 4-0 KS 27 (SUTURE) ×2 IMPLANT
SYR CONTROL 10ML LL (SYRINGE) ×2 IMPLANT
TOWEL OR 17X24 6PK STRL BLUE (TOWEL DISPOSABLE) ×2 IMPLANT
TRAY FOLEY W/BAG SLVR 14FR LF (SET/KITS/TRAYS/PACK) ×2 IMPLANT
WATER STERILE IRR 1000ML POUR (IV SOLUTION) ×2 IMPLANT

## 2020-09-20 NOTE — Anesthesia Postprocedure Evaluation (Signed)
Anesthesia Post Note  Patient: Kelly Lane  Procedure(s) Performed: CESAREAN SECTION (N/A )     Patient location during evaluation: PACU Anesthesia Type: Spinal Level of consciousness: oriented and awake and alert Pain management: pain level controlled Vital Signs Assessment: post-procedure vital signs reviewed and stable Respiratory status: spontaneous breathing, respiratory function stable and nonlabored ventilation Cardiovascular status: blood pressure returned to baseline and stable Postop Assessment: no headache, no backache and no apparent nausea or vomiting Anesthetic complications: no   No complications documented.  Last Vitals:  Vitals:   09/20/20 1945 09/20/20 2001  BP: 114/79 119/81  Pulse: 85 88  Resp: 20 (!) 21  Temp:  (!) 35.9 C  SpO2: 100% 99%    Last Pain:  Vitals:   09/20/20 2023  TempSrc:   PainSc: 0-No pain   Pain Goal: Patients Stated Pain Goal: 0 (09/20/20 0740)    LLE Sensation: Tingling (09/20/20 2023)   RLE Sensation: Tingling (09/20/20 2023)     Epidural/Spinal Function Cutaneous sensation: Able to Wiggle Toes (09/20/20 2023), Patient able to flex knees: Yes (09/20/20 2023), Patient able to lift hips off bed: No (09/20/20 2023), Back pain beyond tenderness at insertion site: No (09/20/20 2023), Progressively worsening motor and/or sensory loss: No (09/20/20 2023), Bowel and/or bladder incontinence post epidural: No (09/20/20 2023)  Garwood Wentzell A.

## 2020-09-20 NOTE — Anesthesia Procedure Notes (Signed)
Spinal  Patient location during procedure: OR Start time: 09/20/2020 6:06 PM End time: 09/20/2020 6:08 PM Reason for block: surgical anesthesia Staffing Performed: anesthesiologist  Anesthesiologist: Josephine Igo, MD Preanesthetic Checklist Completed: patient identified, IV checked, site marked, risks and benefits discussed, surgical consent, monitors and equipment checked, pre-op evaluation and timeout performed Spinal Block Patient position: sitting Prep: DuraPrep and site prepped and draped Patient monitoring: heart rate, cardiac monitor, continuous pulse ox and blood pressure Approach: midline Location: L3-4 Injection technique: single-shot Needle Needle type: Pencan  Needle gauge: 24 G Needle length: 9 cm Needle insertion depth: 6 cm Assessment Sensory level: T4 Events: CSF return Additional Notes Patient tolerated procedure well. Adequate sensory level.

## 2020-09-20 NOTE — Progress Notes (Signed)
Labor Progress Note Kelly Lane is a 25 y.o. G2P0010 at [redacted]w[redacted]d presented for severe PEC and Cat II FHT. Admitted 18 days ago for severe PEC (BP & HA), s/p Mg & BMZ. Currently on Labetalol 600mg  TID and Procardia 60mg  bid. BPP 8/8 and AFI 8 today however FHT Cat II with variables and spontaneous decels. Transfer from [facility] unit for IOL.   S:  Comfortable w/o complaints. Denies any symptoms.  O:  BP (!) 134/93   Pulse 95   Temp 97.8 F (36.6 C) (Oral)   Resp 18   LMP 01/28/2020   SpO2 100%  EFM: baseline 135 bpm/ mod variability/no accels,  repetitive prolonged decelerations to nadir of 80 lasting 1-2 minutes Toco/IUPC:q3-20m mins, Terbutaline given SVE: Dilation: Closed Presentation: Vertex (confirmed with ultrasound) Exam by:: [name], CNM  A/P: 25 y.o. G2P0010 [redacted]w[redacted]d with positive contraction stress test after cytotec administration. Persistent Category 2 FHR tracing now.  Cesarean section recommended. The risks of surgery were discussed with the patient including but were not limited to: bleeding which may require transfusion or reoperation; infection which may require antibiotics; injury to bowel, bladder, ureters or other surrounding organs; injury to the fetus; need for additional procedures including hysterectomy in the event of a life-threatening hemorrhage; formation of adhesions; placental abnormalities with subsequent pregnancies; incisional problems; thromboembolic phenomenon and other postoperative/anesthesia complications.  The patient concurred with the proposed plan, giving informed written consent for the procedure.   Anesthesia, Neonatology and OR aware. Preoperative prophylactic antibiotics and SCDs ordered on call to the OR.  To OR when ready.   Verita Schneiders, MD 5:57 PM

## 2020-09-20 NOTE — Progress Notes (Signed)
CSW met with MOB at [facility] bedside. Without prompting MOB openly shared that she will not be doing an adoption plan.  MOB stated,  "I want to try and parent and if it's too much than I will consider my other options." CSW supported MOB's decision an agreed to connect MOB with community agencies to assist with parenting education and child development. MOB expressed gratitude and support. Per MOB, MOB does not have any essential items to care for infant and is open to receiving resources and supports. CSW agreed to follow-up with MOB post delivery. MOB communicated that will be induced on Saturday 5/21).  CSW will continue to offer resources and supports to MOB while MOB remains inpatient and during infant's NICU admission.   Kelly Lane, MSW, LCSW Clinical Social Work [phone]

## 2020-09-20 NOTE — Anesthesia Preprocedure Evaluation (Signed)
Anesthesia Evaluation  Patient identified by MRN, date of birth, ID band Patient awake    Airway Mallampati: II  TM Distance: >3 FB Neck ROM: Full    Dental no notable dental hx. (+) Teeth Intact, Dental Advisory Given   Pulmonary neg pulmonary ROS,    Pulmonary exam normal breath sounds clear to auscultation       Cardiovascular hypertension, Pt. on medications Normal cardiovascular exam Rhythm:Regular Rate:Normal     Neuro/Psych  Headaches, Anxiety    GI/Hepatic Neg liver ROS, GERD  Medicated and Controlled,  Endo/Other  Ovarian Cyst  Renal/GU negative Renal ROS  negative genitourinary   Musculoskeletal negative musculoskeletal ROS (+)   Abdominal   Peds  Hematology  (+) anemia ,   Anesthesia Other Findings   Reproductive/Obstetrics (+) Pregnancy 33 5/7 weeks Severe Pre Eclampsia Non reassuring FHR tracing                             Anesthesia Physical Anesthesia Plan  ASA: III and emergent  Anesthesia Plan: Spinal   Post-op Pain Management:    Induction:   PONV Risk Score and Plan: 4 or greater and Scopolamine patch - Pre-op, Treatment may vary due to age or medical condition and Ondansetron  Airway Management Planned: Natural Airway  Additional Equipment:   Intra-op Plan:   Post-operative Plan:   Informed Consent: I have reviewed the patients History and Physical, chart, labs and discussed the procedure including the risks, benefits and alternatives for the proposed anesthesia with the patient or authorized representative who has indicated his/her understanding and acceptance.     Dental advisory given  Plan Discussed with: Anesthesiologist and CRNA  Anesthesia Plan Comments:         Anesthesia Quick Evaluation

## 2020-09-20 NOTE — Progress Notes (Signed)
[place]) NOTE  ISABELLA ROEMMICH is a 25 y.o. G2P0010 with Estimated Date of Delivery: 11/03/20   By  best clinical estimate [redacted]w[redacted]d  who is admitted for preeclampsia with severe features.    Fetal presentation is cephalic. Length of Stay:  18  Days  Date of admission:09/02/2020  Subjective: Pt resting comfortably in bed- denies F/C/CP/SOB.  Denies headache or blurry vision, no RUQ pain.  Pt is concerned about her heart. Patient reports the fetal movement as active. Patient reports uterine contraction  activity as none. Patient reports  vaginal bleeding as none. Patient describes fluid per vagina as None.  Vitals:  Blood pressure (!) 145/101, pulse 96, temperature 97.9 F (36.6 C), temperature source Oral, resp. rate 18, last menstrual period 01/28/2020, SpO2 99 %, unknown if currently breastfeeding. Vitals:   09/19/20 2203 09/19/20 2328 09/20/20 0423 09/20/20 0801  BP: (!) 149/104 (!) 141/102 129/82 (!) 145/101  Pulse: 94 92 88 96  Resp: 20 18 18 18   Temp:  98.6 F (37 C) 98.1 F (36.7 C) 97.9 F (36.6 C)  TempSrc:  Oral Oral Oral  SpO2:  99% 99% 99%   Physical Examination:  General appearance - alert, well appearing, and in no distress Mental status - normal mood, behavior, speech, dress, motor activity, and thought processes Chest - clear to auscultation, no wheezes, rales or rhonchi, symmetric air entry Heart - normal rate and regular rhythm Abdomen - gravid non-tender Extremities - minimal pedal edema noted R>L, no calf tenderness bilaterally Skin - warm and dry   Fetal Monitoring:  Baseline: 140 bpm, Variability: moderate, Accelerations: +accels, and Decelerations: occasional variable decels  Noted  reactive  Labs:  Results for orders placed or performed during the hospital encounter of 09/02/20 (from the past 24 hour(s))  Type and screen [place]   Collection Time: 09/20/20  7:05 AM  Result Value Ref Range   ABO/RH(D)  PENDING    Antibody Screen PENDING    Sample Expiration      09/23/2020,2359 Performed at [facility], [place] [address], [place], [place] [address]   CBC   Collection Time: 09/20/20  7:06 AM  Result Value Ref Range   WBC 10.2 4.0 - 10.5 K/uL   RBC 3.44 (L) 3.87 - 5.11 MIL/uL   Hemoglobin 11.2 (L) 12.0 - 15.0 g/dL   HCT 31.7 (L) 36.0 - 46.0 %   MCV 92.2 80.0 - 100.0 fL   MCH 32.6 26.0 - 34.0 pg   MCHC 35.3 30.0 - 36.0 g/dL   RDW 12.9 11.5 - 15.5 %   Platelets 218 150 - 400 K/uL   nRBC 0.0 0.0 - 0.2 %  Comprehensive metabolic panel   Collection Time: 09/20/20  7:06 AM  Result Value Ref Range   Sodium 137 135 - 145 mmol/L   Potassium 4.1 3.5 - 5.1 mmol/L   Chloride 109 98 - 111 mmol/L   CO2 19 (L) 22 - 32 mmol/L   Glucose, Bld 96 70 - 99 mg/dL   BUN 11 6 - 20 mg/dL   Creatinine, Ser 0.96 0.44 - 1.00 mg/dL   Calcium 8.4 (L) 8.9 - 10.3 mg/dL   Total Protein 5.5 (L) 6.5 - 8.1 g/dL   Albumin 2.4 (L) 3.5 - 5.0 g/dL   AST 21 15 - 41 U/L   ALT 13 0 - 44 U/L   Alkaline Phosphatase 97 38 - 126 U/L   Total Bilirubin 0.9 0.3 - 1.2 mg/dL  GFR, Estimated >60 >60 mL/min   Anion gap 9 5 - 15    Imaging Studies:    [place] MFM FETAL BPP WO NON STRESS  Result Date: 09/17/2020 ----------------------------------------------------------------------  OBSTETRICS REPORT                        (Signed Final 09/17/2020 09:43 pm) ---------------------------------------------------------------------- Patient Info  ID #:       [id number]                          D.O.B.:  1995/05/22 (24 yrs)  Name:       [name]                   Visit Date: 09/17/2020 08:19 am ---------------------------------------------------------------------- Performed By  Attending:        Sander Nephew      Ref. Address:      [facility]                    MD                                                              GYN  Performed By:     Germain Osgood            Secondary Phy.:    [facility]                     RDMS                                                              Care  Referred By:      Clearnce Sorrel             [facility]                    [place] ---------------------------------------------------------------------- Orders  #  Description                           Code        Ordered By  1  [place] MFM FETAL BPP WO NON               02725.36    [place] ----------------------------------------------------------------------  #  Order #                     Accession #                Episode #  1  [id number]                   [id number]  [id number] ---------------------------------------------------------------------- Indications  Hypertension - Chronic with superimposed        O11.9 O10.919  preeclampsia (on Labetolol)  Echogenic intracardiac focus of the heart       O35.8XX0  (EIF)  Uterine fibroids                                O34.10  Rh negative state in antepartum                 O36.0190  Negative AFP(Low Risk NIPS)  Cystic Fibrosis (CF) [place], second trimester  O09.892  [redacted] weeks gestation of pregnancy                 Z3A.33 ---------------------------------------------------------------------- Fetal Evaluation  Num Of Fetuses:          1  Fetal Heart Rate(bpm):   131  Cardiac Activity:        Observed  Presentation:            Cephalic  Placenta:                Posterior  P. Cord Insertion:       Previously Visualized  Amniotic Fluid  AFI FV:      Within normal limits  AFI Sum(cm)     %Tile       Largest Pocket(cm)  9.38            13          3.8  RUQ(cm)       RLQ(cm)       LUQ(cm)        LLQ(cm)  0.95          1.58          3.8            3.05 ---------------------------------------------------------------------- Biophysical Evaluation  Amniotic F.V:   Pocket => 2 cm             F. Tone:         Observed  F. Movement:    Observed                   Score:           8/8  F. Breathing:   Observed  ---------------------------------------------------------------------- Biometry  LV:        2.2  mm ---------------------------------------------------------------------- OB History  Gravidity:    2         Prem:   0         SAB:   0  TOP:          1       Ectopic:  0        Living: 0 ---------------------------------------------------------------------- Gestational Age  LMP:           33w 2d        Date:  01/28/20                 EDD:   11/03/20  Best:          33w 2d     Det. By:  LMP  (01/28/20)          EDD:   11/03/20 ---------------------------------------------------------------------- Anatomy  Ventricles:            Appears normal         Abdomen:  Appears normal  Heart:                 Previously             Kidneys:                Appear normal                         visualizedl; EIF  Diaphragm:             Appears normal         Bladder:                Appears normal  Stomach:               Appears normal, left                         sided ---------------------------------------------------------------------- Cervix Uterus Adnexa  Cervix  Not visualized (advanced GA >24wks)  Uterus  No abnormality visualized.  Right Ovary  Not visualized.  Left Ovary  Not visualized.  Cul De Sac  No free fluid seen.  Adnexa  No adnexal mass visualized. ---------------------------------------------------------------------- Impression  Antenatal testing performed given maternal chronic  hypertension on medication.  The biophysical profile was 8/8 with good fetal movement and  amniotic fluid volume. ---------------------------------------------------------------------- Recommendations  Continue weekly testin. ----------------------------------------------------------------------               Sander Nephew, MD Electronically Signed Final Report   09/17/2020 09:43 pm ----------------------------------------------------------------------    Medications:  Scheduled . aspirin EC  81 mg Oral Daily  .  betamethasone acetate-betamethasone sodium phosphate  12 mg Intramuscular Q24 Hr x 2  . carbamide peroxide  5 drop Both EARS BID  . famotidine  20 mg Oral BID  . labetalol  600 mg Oral TID  . NIFEdipine  60 mg Oral BID  . prenatal vitamin w/FE, FA  1 tablet Oral Q1200  . sodium chloride flush  3 mL Intravenous Q12H   I have reviewed the patient's current medications.  ASSESSMENT: G2P0010 [redacted]w[redacted]d Estimated Date of Delivery: 11/03/20  Admitted for preeclampsia with severe features  1) Preeclampsiawith severe features -BPas above- Labetalol to 600mg  tid,Procardia 60mg  bid -labs q 72hr, completed this am- overall stable, continue to see slow increase of Cr -monitoring I/Os, UOP appropriate -s/p Mag x 24hr, will plan to restart once transitioned to labor  2) Fetal well being -NICHD- Cat. 2- pt given fluid bolus and transitioned to continuous monitoring.  If FHT continues to be Cat. 2 will plan to proceed with induction today  -BPP q 3 days-BPP completed this am- prelim report 8/8 S/p BMZ 4/27-28, -2nd BMZ to be given today *Rh neg  3) Maternal well being -reviewed her concerns regarding her heart- reassured pt that there is no evidence of acute cardiac concerns.  Discussed importance of postpartum follow up  Social:she is considering adoption SW consult PRN and postpartum *History of depression: no current issues.  *PPx: SCDs, activity as tolerated  DISPO: Currently on continous monitoring, if FHT continues to be Cat. 2 will plan to proceed with induction today   Annalee Genta 09/20/2020,9:51 AM

## 2020-09-20 NOTE — Progress Notes (Addendum)
Labor Progress Note Kelly Lane is a 25 y.o. G2P0010 at [redacted]w[redacted]d presented for severe PEC and Cat II FHT. Admitted 18 days ago for severe PEC (BP & HA), s/p Mg & BMZ. Currently on Labetalol 600mg  TID and Procardia 60mg  bid. BPP 8/8 and AFI 8 today however FHT Cat II with variables and spontaneous decels. Transfer from [facility] unit for IOL.   S:  Comfortable w/o complaints. Denies HA, visual disturbances, RUQ pain, SOB, and CP.  O:  BP (!) 145/105   Pulse 87   Temp 98.2 F (36.8 C) (Oral)   Resp 18   LMP 01/28/2020   SpO2 99%  EFM: baseline 135 bpm/ mod variability/ no accels/ no decels  Toco/IUPC: none SVE: Dilation: Closed Presentation: Vertex (confirmed with ultrasound) Exam by:: [name], CNM  A/P: 25 y.o. G2P0010 [redacted]w[redacted]d  1. Labor: latent 2. FWB: Cat I 3. Pain: analgesia/anesthesia/NO prn 4. PEC, severe  Start MgSO4. Cytotec for ripening. FB when able. Anticipate SVD. Originally was planning adoption but per MSW note changed mind today.   [name], CNM 4:24 PM

## 2020-09-20 NOTE — Op Note (Signed)
Kelly Lane PROCEDURE DATE: 09/20/2020  PREOPERATIVE DIAGNOSES: Intrauterine pregnancy at [redacted]w[redacted]d weeks gestation; non-reassuring fetal status (positive contraction stress test after misoprostol administration remote from vaginal delivery) and severe preeclampsia  POSTOPERATIVE DIAGNOSES: The same  PROCEDURE: Emergent Primary Low Transverse Cesarean Section  SURGEON:  Dr. Verita Schneiders  ANESTHESIOLOGY TEAM: Anesthesiologist: Josephine Igo, MD CRNA: Sandrea Matte, CRNA  INDICATIONS: Kelly Lane is a 25 y.o. G2P0010 at [redacted]w[redacted]d here for cesarean section secondary to the indications listed under preoperative diagnoses; please see preoperative note for further details.  The risks of surgery were discussed with the patient including but were not limited to: bleeding which may require transfusion or reoperation; infection which may require antibiotics; injury to bowel, bladder, ureters or other surrounding organs; injury to the fetus; need for additional procedures including hysterectomy in the event of a life-threatening hemorrhage; formation of adhesions; placental abnormalities wth subsequent pregnancies; incisional problems; thromboembolic phenomenon and other postoperative/anesthesia complications.  The patient concurred with the proposed plan, giving informed written consent for the procedure.     Of note, when the cesarean section was called in the L&D room is was urgent. This turned to emergent while in OR and prolonged FHR deceleration was noted for several minutes while preparing for spinal placement.  FINDINGS:  Viable female infant in cephalic presentation.  Apgars 8/9  Clear amniotic fluid.  Intact placenta, three vessel cord.  Normal uterus, fallopian tubes and ovaries bilaterally.  ANESTHESIA: Spinal INTRAVENOUS FLUIDS: 1400 ml   ESTIMATED BLOOD LOSS: 350 ml URINE OUTPUT:  600 ml SPECIMENS: Placenta sent to pathology COMPLICATIONS: None immediate  PROCEDURE IN DETAIL:  The  patient preoperatively received intravenous antibiotics and had sequential compression devices applied to her lower extremities.  She was then taken to the operating room where it was noted she had a prolonged fetal heart deceleration.  The spinal anesthesia was administered and was found to be adequate.  She was then placed in a dorsal supine position with a leftward tilt, prepped quickly with betadine and draped in a sterile manner.  She already had a foley catheter in her bladder from L&D.  After a timeout was performed, a Pfannenstiel skin incision was made with scalpel and carried through to the underlying layer of fascia. The fascial incision was extended bilaterally in a blunt fashion.  The fascia was separated from underlying rectus muscles bluntly.  The rectus muscles were separated in the midline bluntly and the peritoneum was entered bluntly. Attention was turned to the lower uterine segment where a low transverse hysterotomy was made with a scalpel and extended bilaterally bluntly.  The infant was successfully delivered, the cord was clamped and cut and the infant was handed over to awaiting neonatology team. Incision to delivery time was less than two minutes.  The placenta was delivered intact and had a three-vessel cord. The uterus was then cleared of clot and debris.  The hysterotomy was closed with 0 Vicryl in a running locked fashion, and an imbricating layer was also placed with 0 Vicryl. The pelvis was cleared of all clot and debris. Hemostasis was confirmed on all surfaces. The fascia was then closed using 0 Vicryl in a running fashion.  The subcutaneous layer was irrigated and the skin was closed with a 4-0 Vicryl subcuticular stitch. The patient tolerated the procedure well. Sponge, instrument and needle counts were correct x 3.  She was taken to the recovery room in stable condition.      Verita Schneiders, MD,  [place] [employer], [place]

## 2020-09-20 NOTE — Transfer of Care (Signed)
Immediate Anesthesia Transfer of Care Note  Patient: Kelly Lane  Procedure(s) Performed: CESAREAN SECTION (N/A )  Patient Location: PACU  Anesthesia Type:Spinal  Level of Consciousness: awake, alert  and patient cooperative  Airway & Oxygen Therapy: Patient Spontanous Breathing  Post-op Assessment: Report given to RN and Post -op Vital signs reviewed and stable  Post vital signs: Reviewed and stable  Last Vitals:  Vitals Value Taken Time  BP    Temp    Pulse    Resp    SpO2      Last Pain:  Vitals:   09/20/20 1653  TempSrc: Oral  PainSc:       Patients Stated Pain Goal: 0 (02/77/41 2878)  Complications: No complications documented.

## 2020-09-21 ENCOUNTER — Encounter (HOSPITAL_COMMUNITY): Payer: Self-pay | Admitting: Obstetrics and Gynecology

## 2020-09-21 DIAGNOSIS — O1413 Severe pre-eclampsia, third trimester: Secondary | ICD-10-CM

## 2020-09-21 DIAGNOSIS — O113 Pre-existing hypertension with pre-eclampsia, third trimester: Secondary | ICD-10-CM | POA: Diagnosis not present

## 2020-09-21 DIAGNOSIS — Z3A33 33 weeks gestation of pregnancy: Secondary | ICD-10-CM

## 2020-09-21 DIAGNOSIS — O358XX0 Maternal care for other (suspected) fetal abnormality and damage, not applicable or unspecified: Secondary | ICD-10-CM

## 2020-09-21 DIAGNOSIS — D259 Leiomyoma of uterus, unspecified: Secondary | ICD-10-CM

## 2020-09-21 DIAGNOSIS — O360130 Maternal care for anti-D [Rh] antibodies, third trimester, not applicable or unspecified: Secondary | ICD-10-CM

## 2020-09-21 DIAGNOSIS — O09893 Supervision of other high risk pregnancies, third trimester: Secondary | ICD-10-CM

## 2020-09-21 DIAGNOSIS — O3413 Maternal care for benign tumor of corpus uteri, third trimester: Secondary | ICD-10-CM

## 2020-09-21 LAB — COMPREHENSIVE METABOLIC PANEL
ALT: 17 U/L (ref 0–44)
AST: 30 U/L (ref 15–41)
Albumin: 2.6 g/dL — ABNORMAL LOW (ref 3.5–5.0)
Alkaline Phosphatase: 110 U/L (ref 38–126)
Anion gap: 8 (ref 5–15)
BUN: 10 mg/dL (ref 6–20)
CO2: 21 mmol/L — ABNORMAL LOW (ref 22–32)
Calcium: 7.7 mg/dL — ABNORMAL LOW (ref 8.9–10.3)
Chloride: 105 mmol/L (ref 98–111)
Creatinine, Ser: 0.91 mg/dL (ref 0.44–1.00)
GFR, Estimated: >60 mL/min (ref >60)
Glucose, Bld: 105 mg/dL — ABNORMAL HIGH (ref 70–99)
Potassium: 4.4 mmol/L (ref 3.5–5.1)
Sodium: 134 mmol/L — ABNORMAL LOW (ref 135–145)
Total Bilirubin: 0.7 mg/dL (ref 0.3–1.2)
Total Protein: 5.7 g/dL — ABNORMAL LOW (ref 6.5–8.1)

## 2020-09-21 LAB — CBC
HCT: 32.6 % — ABNORMAL LOW (ref 36.0–46.0)
Hemoglobin: 11.3 g/dL — ABNORMAL LOW (ref 12.0–15.0)
MCH: 32.7 pg (ref 26.0–34.0)
MCHC: 34.7 g/dL (ref 30.0–36.0)
MCV: 94.2 fL (ref 80.0–100.0)
Platelets: 237 10*3/uL (ref 150–400)
RBC: 3.46 MIL/uL — ABNORMAL LOW (ref 3.87–5.11)
RDW: 12.8 % (ref 11.5–15.5)
WBC: 20.7 10*3/uL — ABNORMAL HIGH (ref 4.0–10.5)
nRBC: 0 % (ref 0.0–0.2)

## 2020-09-21 MED ORDER — FUROSEMIDE 10 MG/ML IJ SOLN
10.0000 mg | Freq: Every day | INTRAMUSCULAR | Status: DC
  Administered 2020-09-21: 10 mg via INTRAVENOUS
  Filled 2020-09-21 (×2): qty 2

## 2020-09-21 NOTE — Lactation Note (Signed)
This note was copied from a baby's chart. Lactation Consultation Note LC to room for initial consult. Mother on mag and very sleepy. LC provided initial ed and lactation brochure. Taught HE and mother able to return demonstrate. Assisted with initiating electric pumping. Mother has already contacted [facility] in [place]. and made arrangements to receive an electric pump. She is aware that [facility] can send a referral to [facility] prn.   Patient Name: Kelly Lane IDCVU'D Date: 09/21/2020 Reason for consult: NICU baby;Initial assessment Age:26 hours  Maternal Data Has patient been taught Hand Expression?: Yes Does the patient have breastfeeding experience prior to this delivery?: No Normal breast development  No hx breast surgery/trauma Mother endorses lump in L breast. Advised mother to consult with her MD.  Feeding Mother's Current Feeding Choice: Breast Milk and Donor Milk   Interventions Interventions: Breast feeding basics reviewed;Education;Hand express;DEBP   Consult Status Consult Status: Follow-up Follow-up type: [place], MA IBCLC 09/21/2020, 3:08 PM

## 2020-09-21 NOTE — Lactation Note (Signed)
Lactation Consultation Note Patient endorses lump in L breast. LC advised patient to consult with her provider. Relayed conversation to [employer].   Patient Name: Kelly Lane Date: 09/21/2020   Age:25 y.o.  Gwynne Edinger, MA IBCLC 09/21/2020, 3:12 PM

## 2020-09-21 NOTE — Plan of Care (Signed)
  Problem: Clinical Measurements: Goal: Ability to maintain clinical measurements within normal limits will improve Outcome: Progressing   Problem: Coping: Goal: Level of anxiety will decrease Outcome: Progressing   Problem: Pain Managment: Goal: General experience of comfort will improve Outcome: Progressing   Problem: Education: Goal: Knowledge of disease or condition will improve Outcome: Progressing Goal: Knowledge of the prescribed therapeutic regimen will improve Outcome: Progressing   Problem: Activity: Goal: Will verbalize the importance of balancing activity with adequate rest periods Outcome: Progressing Goal: Ability to tolerate increased activity will improve Outcome: Progressing   Problem: Life Cycle: Goal: Chance of risk for complications during the postpartum period will decrease Outcome: Progressing   Problem: Skin Integrity: Goal: Demonstration of wound healing without infection will improve Outcome: Progressing

## 2020-09-21 NOTE — Progress Notes (Signed)
Post Partum Day 1 s/p emergent LTCS at [redacted]w[redacted]d for NRFHT in the setting of severe preeclampsia.  Subjective: No complaints. Patient denies any headaches, visual symptoms, RUQ/epigastric pain or other concerning symptoms. No flatus yet.  No OOB yet. Baby is stable in NICU.  Objective: Blood pressure (!) 141/94, pulse 86, temperature 97.6 F (36.4 C), temperature source Oral, resp. rate 16, weight 83.7 kg, last menstrual period 01/28/2020, SpO2 99 %, unknown if currently breastfeeding.  Physical Exam:  General: alert and no distress Lochia: appropriate Uterine Fundus: NT, firm Incision: no significant drainage, pressure dressing in place DVT Evaluation: No evidence of DVT seen on physical exam. Negative Homan's sign. 1-2+ BLE edema.  Recent Labs    09/20/20 0706  HGB 11.2*  HCT 31.7*    Assessment/Plan: - Continue magnesium sulfate for 24 hours postpartum - Ordered for Procardia XL 60 mg po bid for now, may add more medications if needed - Also ordered for Lasix 10 mg IV daily - Will follow up am labs - Had a long discussion about contraception.  She is considering LARCs - Routine postpartum care   LOS: 19 days   Verita Schneiders, MD 09/21/2020, 4:51 AM

## 2020-09-22 ENCOUNTER — Encounter (HOSPITAL_COMMUNITY): Payer: Self-pay | Admitting: Obstetrics and Gynecology

## 2020-09-22 ENCOUNTER — Encounter (HOSPITAL_COMMUNITY): Payer: BC Managed Care – PPO

## 2020-09-22 MED ORDER — FUROSEMIDE 20 MG PO TABS
10.0000 mg | ORAL_TABLET | Freq: Every day | ORAL | Status: DC
  Administered 2020-09-22 – 2020-09-23 (×2): 10 mg via ORAL
  Filled 2020-09-22 (×2): qty 0.5

## 2020-09-22 MED ORDER — RHO D IMMUNE GLOBULIN 1500 UNIT/2ML IJ SOSY
300.0000 ug | PREFILLED_SYRINGE | Freq: Once | INTRAMUSCULAR | Status: AC
  Administered 2020-09-22: 300 ug via INTRAMUSCULAR
  Filled 2020-09-22: qty 2

## 2020-09-22 MED ORDER — RHO D IMMUNE GLOBULIN 1500 UNIT/2ML IJ SOSY
300.0000 ug | PREFILLED_SYRINGE | Freq: Once | INTRAMUSCULAR | Status: DC
  Filled 2020-09-22: qty 2

## 2020-09-22 NOTE — Progress Notes (Signed)
Subjective: Postpartum Day 2: Cesarean Delivery Patient reports incisional pain and no problems voiding.    Objective: Vital signs in last 24 hours: Temp:  [97.6 F (36.4 C)-98.2 F (36.8 C)] 97.9 F (36.6 C) (05/21 0755) Pulse Rate:  [77-107] 88 (05/21 0755) Resp:  [15-18] 17 (05/21 0755) BP: (116-133)/(81-109) 116/83 (05/21 0755) SpO2:  [98 %-100 %] 99 % (05/21 0755)  Physical Exam:  General: alert, cooperative and no distress Lochia: appropriate Uterine Fundus: firm Incision: healing well DVT Evaluation: No evidence of DVT seen on physical exam.  Recent Labs    09/20/20 0706 09/21/20 0559  HGB 11.2* 11.3*  HCT 31.7* 32.6*    Assessment/Plan: Status post Cesarean section. Doing well postoperatively.  Continue current care. Procardia xl 60 BID + lasix 10 IV  Florian Buff 09/22/2020, 9:07 AM

## 2020-09-22 NOTE — Lactation Note (Signed)
This note was copied from a baby's chart. Lactation Consultation Note Mother pumping when [place] arrived. Provided a hands-free pumping top. Reviewed pumping suggestions and timeline for increased milk volume.   Patient Name: Kelly Lane AXENM'M Date: 09/22/2020 Reason for consult: NICU baby;Follow-up assessment Age:26 hours   Feeding Mother's Current Feeding Choice: Breast Milk and Donor Milk     Consult Status Consult Status: Follow-up Follow-up type: [place], [place] IBCLC 09/22/2020, 10:55 AM

## 2020-09-22 NOTE — Lactation Note (Signed)
This note was copied from a baby's chart. Lactation Consultation Note LC to NICU for feeding assistance. Baby in biological position and rooting; latched easily. Baby remained at breast with rhythmic suckling for 13 minutes. He took a few breaks and self-paced. No signs of stress.Mother able to return demonstrate positioning/latch. Mother was pleased with infant's effort. [place] left mom and baby sts. Will plan f/u visits to assist further prn.   Patient Name: Kelly Lane [id number] Date: 09/22/2020 Reason for consult: Follow-up assessment;1st time breastfeeding Age:26 hours  Maternal Data    Feeding Mother's Current Feeding Choice: Breast Milk and Donor Milk  LATCH Score Latch: Grasps breast easily, tongue down, lips flanged, rhythmical sucking.  Audible Swallowing: Spontaneous and intermittent  Type of Nipple: Everted at rest and after stimulation  Comfort (Breast/Nipple): Soft / non-tender  Hold (Positioning): Assistance needed to correctly position infant at breast and maintain latch.  LATCH Score: 9   Interventions Interventions: Assisted with latch;Breast feeding basics reviewed;Skin to skin;Adjust position;Support pillows;Position options  Consult Status Consult Status: Follow-up Follow-up type: In-patient   Gwynne Edinger, MA IBCLC 09/22/2020, 12:40 PM

## 2020-09-23 ENCOUNTER — Other Ambulatory Visit: Payer: Self-pay | Admitting: Obstetrics & Gynecology

## 2020-09-23 LAB — RH IG WORKUP (INCLUDES ABO/RH)
Fetal Screen: NEGATIVE
Gestational Age(Wks): 34
Unit division: 0

## 2020-09-23 MED ORDER — IBUPROFEN 600 MG PO TABS: 600.0000 mg | ORAL_TABLET | Freq: Four times a day (QID) | ORAL | 0 refills | Status: DC

## 2020-09-23 MED ORDER — NIFEDIPINE ER 60 MG PO TB24
60.0000 mg | ORAL_TABLET | Freq: Two times a day (BID) | ORAL | 1 refills | Status: DC
Start: 2020-09-23 — End: 2020-09-23

## 2020-09-23 MED ORDER — HYDROCHLOROTHIAZIDE 25 MG PO TABS
25.0000 mg | ORAL_TABLET | Freq: Every day | ORAL | 0 refills | Status: DC
Start: 2020-09-23 — End: 2020-09-23

## 2020-09-23 MED ORDER — OXYCODONE-ACETAMINOPHEN 5-325 MG PO TABS: 2.0000 | ORAL_TABLET | ORAL | 0 refills | Status: DC | PRN

## 2020-09-23 MED ORDER — HYDROCHLOROTHIAZIDE 25 MG PO TABS: 25.0000 mg | ORAL_TABLET | Freq: Every day | ORAL | 0 refills | Status: DC

## 2020-09-23 MED ORDER — NIFEDIPINE ER 60 MG PO TB24: 60.0000 mg | ORAL_TABLET | Freq: Two times a day (BID) | ORAL | 1 refills | Status: DC

## 2020-09-23 NOTE — Plan of Care (Signed)
  Problem: Health Behavior/Discharge Planning: Goal: Ability to manage health-related needs will improve Outcome: Completed/Met   Problem: Clinical Measurements: Goal: Ability to maintain clinical measurements within normal limits will improve Outcome: Completed/Met Goal: Will remain free from infection Outcome: Completed/Met Goal: Diagnostic test results will improve Outcome: Completed/Met Goal: Cardiovascular complication will be avoided Outcome: Completed/Met   Problem: Activity: Goal: Risk for activity intolerance will decrease Outcome: Completed/Met   Problem: Coping: Goal: Level of anxiety will decrease Outcome: Completed/Met   Problem: Elimination: Goal: Will not experience complications related to bowel motility Outcome: Completed/Met Goal: Will not experience complications related to urinary retention Outcome: Completed/Met   Problem: Pain Managment: Goal: General experience of comfort will improve Outcome: Completed/Met   Problem: Safety: Goal: Ability to remain free from injury will improve Outcome: Completed/Met   Problem: Skin Integrity: Goal: Risk for impaired skin integrity will decrease Outcome: Completed/Met   Problem: Education: Goal: Knowledge of disease or condition will improve Outcome: Completed/Met Goal: Knowledge of the prescribed therapeutic regimen will improve Outcome: Completed/Met   Problem: Fluid Volume: Goal: Peripheral tissue perfusion will improve Outcome: Completed/Met   Problem: Clinical Measurements: Goal: Complications related to disease process, condition or treatment will be avoided or minimized Outcome: Completed/Met   Problem: Education: Goal: Knowledge of disease or condition will improve Outcome: Completed/Met Goal: Knowledge of the prescribed therapeutic regimen will improve Outcome: Completed/Met Goal: Individualized Educational Video(s) Outcome: Completed/Met   Problem: Clinical Measurements: Goal:  Complications related to the disease process, condition or treatment will be avoided or minimized Outcome: Completed/Met   Problem: Education: Goal: Knowledge of Childbirth will improve Outcome: Completed/Met Goal: Ability to make informed decisions regarding treatment and plan of care will improve Outcome: Completed/Met Goal: Ability to state and carry out methods to decrease the pain will improve Outcome: Completed/Met Goal: Individualized Educational Video(s) Outcome: Completed/Met   Problem: Coping: Goal: Ability to verbalize concerns and feelings about labor and delivery will improve Outcome: Completed/Met   Problem: Life Cycle: Goal: Ability to make normal progression through stages of labor will improve Outcome: Completed/Met Goal: Ability to effectively push during vaginal delivery will improve Outcome: Completed/Met   Problem: Role Relationship: Goal: Will demonstrate positive interactions with the child Outcome: Completed/Met   Problem: Safety: Goal: Risk of complications during labor and delivery will decrease Outcome: Completed/Met   Problem: Pain Management: Goal: Relief or control of pain from uterine contractions will improve Outcome: Completed/Met   Problem: Education: Goal: Knowledge of condition will improve Outcome: Completed/Met Goal: Individualized Educational Video(s) Outcome: Completed/Met Goal: Individualized Newborn Educational Video(s) Outcome: Completed/Met   Problem: Activity: Goal: Will verbalize the importance of balancing activity with adequate rest periods Outcome: Completed/Met Goal: Ability to tolerate increased activity will improve Outcome: Completed/Met   Problem: Coping: Goal: Ability to identify and utilize available resources and services will improve Outcome: Completed/Met   Problem: Life Cycle: Goal: Chance of risk for complications during the postpartum period will decrease Outcome: Completed/Met   Problem: Role  Relationship: Goal: Ability to demonstrate positive interaction with newborn will improve Outcome: Completed/Met   Problem: Skin Integrity: Goal: Demonstration of wound healing without infection will improve Outcome: Completed/Met

## 2020-09-23 NOTE — Lactation Note (Signed)
This note was copied from a baby's chart. Lactation Consultation Note  Patient Name: Kelly Lane [id number]'Y Date: 09/23/2020 Reason for consult: Follow-up assessment;1st time breastfeeding;NICU baby;Late-preterm 34-36.6wks Age:26 hours  I followed up with Ms. Truman Hayward. She was pumping in the room upon entry. I noted that she pumped 15+ mls from the right breast. I praised her for her hard work. She states that she has labels but needs more storage bottles.  She took her Medela kit storage bottles up to NICU yesterday, and she asked for those back. I notified the NICU RN to be looking out for them to come back from milk lab.  Ms. Dungee states that her OB is going to discharge her today. She does not have a pump at home. She is on her mother's [employer] plan, and she also has [place]. I spoke to her about her options. She signed a referral form to [facility], which I faxed today.  I also recommended that she call [employer] tomorrow regarding pump eligibility.  I also made her aware of our short term loaner program, and she stated that she would let me know if she wanted to do that today.  I encouraged her to continue pumping both breasts every three hours for 15 minutes. She denies engorgement at this visit.  I provided additional storage bottles and clarified a questions about reusing them. In the NICU, the milk lab will discard the storage bottles (the ones provided by the hospital) after she pumps in them, but if she is at home, she can wash them (ones that are not going to NICU) and reuse them as per the directions on the package.  Maternal Data Does the patient have breastfeeding experience prior to this delivery?: No  Feeding Mother's Current Feeding Choice: Breast Milk and Donor Milk   Lactation Tools Discussed/Used Tools: Pump Breast pump type: Double-Electric Breast Pump Pump Education: Setup, frequency, and cleaning;Milk Storage Reason for Pumping: NICU - separation Pumping frequency: q3 Pumped  volume: 15 mL (15-20 mls)  Interventions Interventions: Breast feeding basics reviewed;DEBP;Education  Discharge Pump: DEBP;Advised to call insurance company (possibly a [place] later today) [place] Program: Yes  Consult Status Consult Status: Follow-up Date: 09/24/20 Follow-up type: In-patient    Lenore Manner 09/23/2020, 8:32 AM

## 2020-09-23 NOTE — Discharge Instructions (Signed)
Preeclampsia and Eclampsia Preeclampsia is a serious condition that may develop during pregnancy. This condition involves high blood pressure during pregnancy and causes symptoms such as headaches, vision changes, and increased swelling in the legs, hands, and face. Preeclampsia occurs after 20 weeks of pregnancy. Eclampsia is a seizure that happens from worsening preeclampsia. Diagnosing and managing preeclampsia early is important. If not treated early, it can cause serious problems for mother and baby. There is no cure for this condition. However, during pregnancy, delivering the baby may be the best treatment for preeclampsia or eclampsia. For most women, symptoms of preeclampsia and eclampsia go away after giving birth. In rare cases, a woman may develop preeclampsia or eclampsia after giving birth. This usually occurs within 48 hours after childbirth but may occur up to 6 weeks after giving birth. What are the causes? The cause of this condition is not known. What increases the risk? The following factors make you more likely to develop preeclampsia:  Being pregnant for the first time or being pregnant with multiples.  Having had preeclampsia or a condition called hemolysis, elevated liver enzymes, and low platelet count (HELLP)syndrome during a past pregnancy.  Having a family history of preeclampsia.  Being older than age 83.  Being obese.  Becoming pregnant through fertility treatments. Conditions that reduce blood flow or oxygen to your placenta and baby may also increase your risk. These include:  High blood pressure before, during, or immediately following pregnancy.  Kidney disease.  Diabetes.  Blood clotting disorders.  Autoimmune diseases, such as lupus.  Sleep apnea. What are the signs or symptoms? Common symptoms of this condition include:  A severe, throbbing headache that does not go away.  Vision problems, such as blurred or double vision and light  sensitivity.  Pain in the stomach, especially the right upper region.  Pain in the shoulder. Other symptoms that may develop as the condition gets worse include:  Sudden weight gain because of fluid buildup in the body. This causes swelling of the face, hands, legs, and feet.  Severe nausea and vomiting.  Urinating less than usual.  Shortness of breath.  Seizures. How is this diagnosed? Your health care provider will ask you about symptoms and check for signs of preeclampsia during your prenatal visits. You will also have routine tests, including:  Checking your blood pressure.  Urine tests to check for protein.  Blood tests to assess your organ function.  Monitoring your baby's heart rate.  Ultrasounds to check fetal growth.   How is this treated? You and your health care provider will determine the treatment that is best for you. Treatment may include:  Frequent prenatal visits to check for preeclampsia.  Medicine to lower your blood pressure.  Medicine to prevent seizures.  Low-dose aspirin during your pregnancy.  Staying in the hospital, in severe cases. You will be given medicines to control your blood pressure and the amount of fluids in your body.  Delivering your baby. Work with your health care provider to manage any chronic health conditions, such as diabetes or kidney problems. Also, work with your health care provider to manage weight gain during pregnancy. Follow these instructions at home: Eating and drinking  Drink enough fluid to keep your urine pale yellow.  Avoid caffeine. Caffeine may increase blood pressure and heart rate and lead to dehydration.  Reduce the amount of salt that you eat. Lifestyle  Do not use any products that contain nicotine or tobacco. These products include cigarettes, chewing tobacco, and  vaping devices, such as e-cigarettes. If you need help quitting, ask your health care provider.  Do not use alcohol or drugs.  Avoid  stress as much as possible.  Rest and get plenty of sleep. General instructions  Take over-the-counter and prescription medicines only as told by your health care provider.  When lying down, lie on your left side. This keeps pressure off your major blood vessels.  When sitting or lying down, raise (elevate) your feet. Try putting pillows underneath your lower legs.  Exercise regularly. Ask your health care provider what kinds of exercise are best for you.  Check your blood pressure as often as recommended by your health care provider.  Keep all prenatal and follow-up visits. This is important.   Contact a health care provider if:  You have symptoms that may need treatment or closer monitoring. These include: ? Headaches. ? Stomach pain or nausea and vomiting. ? Shoulder pain. ? Vision problems, such as spots in front of your eyes or blurry vision. ? Sudden weight gain or increased swelling in your face, hands, legs, and feet. ? Increased anxiety or feeling of impending doom. ? Signs or symptoms of labor. Get help right away if:  You have any of the following symptoms: ? A seizure. ? Shortness of breath or trouble breathing. ? Trouble speaking or slurred speech. ? Fainting. ? Chest pain. These symptoms may represent a serious problem that is an emergency. Do not wait to see if the symptoms will go away. Get medical help right away. Call your local emergency services (911 in the [place] Do not drive yourself to the hospital. Summary  Preeclampsia is a serious condition that may develop during pregnancy.  Diagnosing and treating preeclampsia early is very important.  Keep all prenatal and follow-up visits. This is important.  Get help right away if you have a seizure, shortness of breath or trouble breathing, trouble speaking or slurred speech, chest pain, or fainting. This information is not intended to replace advice given to you by your health care provider. Make sure you  discuss any questions you have with your health care provider. Document Revised: 01/12/2020 Document Reviewed: 01/12/2020 Elsevier Patient Education  2021 [place].  Cesarean Delivery, Care After This sheet gives you information about how to care for yourself after your procedure. Your health care provider may also give you more specific instructions. If you have problems or questions, contact your health care provider. What can I expect after the procedure? After the procedure, it is common to have:  A small amount of blood or clear fluid coming from the incision.  Some redness, swelling, and pain in your incision area.  Some abdominal pain and soreness.  Vaginal bleeding (lochia). Even though you did not have a vaginal delivery, you will still have vaginal bleeding and discharge.  Pelvic cramps.  Fatigue. You may have pain, swelling, and discomfort in the tissue between your vagina and your anus (perineum) if:  Your C-section was unplanned, and you were allowed to labor and push.  An incision was made in the area (episiotomy) or the tissue tore during attempted vaginal delivery. Follow these instructions at home: Incision care  Follow instructions from your health care provider about how to take care of your incision. Make sure you: ? Wash your hands with soap and water before you change your bandage (dressing). If soap and water are not available, use hand sanitizer. ? If you have a dressing, change it or remove it as told  by your health care provider. ? Leave stitches (sutures), skin staples, skin glue, or adhesive strips in place. These skin closures may need to stay in place for 2 weeks or longer. If adhesive strip edges start to loosen and curl up, you may trim the loose edges. Do not remove adhesive strips completely unless your health care provider tells you to do that.  Check your incision area every day for signs of infection. Check for: ? More redness, swelling, or  pain. ? More fluid or blood. ? Warmth. ? Pus or a bad smell.  Do not take baths, swim, or use a hot tub until your health care provider says it's okay. Ask your health care provider if you can take showers.  When you cough or sneeze, hug a pillow. This helps with pain and decreases the chance of your incision opening up (dehiscing). Do this until your incision heals.   Medicines  Take over-the-counter and prescription medicines only as told by your health care provider.  If you were prescribed an antibiotic medicine, take it as told by your health care provider. Do not stop taking the antibiotic even if you start to feel better.  Do not drive or use heavy machinery while taking prescription pain medicine. Lifestyle  Do not drink alcohol. This is especially important if you are breastfeeding or taking pain medicine.  Do not use any products that contain nicotine or tobacco, such as cigarettes, e-cigarettes, and chewing tobacco. If you need help quitting, ask your health care provider. Eating and drinking  Drink at least 8 eight-ounce glasses of water every day unless told not to by your health care provider. If you breastfeed, you may need to drink even more water.  Eat high-fiber foods every day. These foods may help prevent or relieve constipation. High-fiber foods include: ? Whole grain cereals and breads. ? Brown rice. ? Beans. ? Fresh fruits and vegetables. Activity  If possible, have someone help you care for your baby and help with household activities for at least a few days after you leave the hospital.  Return to your normal activities as told by your health care provider. Ask your health care provider what activities are safe for you.  Rest as much as possible. Try to rest or take a nap while your baby is sleeping.  Do not lift anything that is heavier than 10 lbs (4.5 kg), or the limit that you were told, until your health care provider says that it is safe.  Talk  with your health care provider about when you can engage in sexual activity. This may depend on your: ? Risk of infection. ? How fast you heal. ? Comfort and desire to engage in sexual activity.   General instructions  Do not use tampons or douches until your health care provider approves.  Wear loose, comfortable clothing and a supportive and well-fitting bra.  Keep your perineum clean and dry. Wipe from front to back when you use the toilet.  If you pass a blood clot, save it and call your health care provider to discuss. Do not flush blood clots down the toilet before you get instructions from your health care provider.  Keep all follow-up visits for you and your baby as told by your health care provider. This is important. Contact a health care provider if:  You have: ? A fever. ? Bad-smelling vaginal discharge. ? Pus or a bad smell coming from your incision. ? Difficulty or pain when urinating. ? A  sudden increase or decrease in the frequency of your bowel movements. ? More redness, swelling, or pain around your incision. ? More fluid or blood coming from your incision. ? A rash. ? Nausea. ? Little or no interest in activities you used to enjoy. ? Questions about caring for yourself or your baby.  Your incision feels warm to the touch.  Your breasts turn red or become painful or hard.  You feel unusually sad or worried.  You vomit.  You pass a blood clot from your vagina.  You urinate more than usual.  You are dizzy or light-headed. Get help right away if:  You have: ? Pain that does not go away or get better with medicine. ? Chest pain. ? Difficulty breathing. ? Blurred vision or spots in your vision. ? Thoughts about hurting yourself or your baby. ? New pain in your abdomen or in one of your legs. ? A severe headache.  You faint.  You bleed from your vagina so much that you fill more than one sanitary pad in one hour. Bleeding should not be heavier than  your heaviest period. Summary  After the procedure, it is common to have pain at your incision site, abdominal cramping, and slight bleeding from your vagina.  Check your incision area every day for signs of infection.  Tell your health care provider about any unusual symptoms.  Keep all follow-up visits for you and your baby as told by your health care provider. This information is not intended to replace advice given to you by your health care provider. Make sure you discuss any questions you have with your health care provider. Document Revised: 10/28/2017 Document Reviewed: 10/28/2017 Elsevier Patient Education  [place].

## 2020-09-23 NOTE — Discharge Summary (Incomplete)
Postpartum Discharge Summary  Date of Service updated***     Patient Name: Kelly Lane DOB: [birth year] MRN: [id number]  Date of admission: 09/02/2020 Delivery date:09/20/2020  Delivering provider: Verita Schneiders A  Date of discharge: 09/23/2020  Admitting diagnosis: Severe pre-eclampsia [O14.10] Intrauterine pregnancy: [redacted]w[redacted]d    Secondary diagnosis:  Principal Problem:   S/P emergency cesarean section for NRFHT Active Problems:   Supervision of high-risk pregnancy   Rh negative state in antepartum period   Severe preeclampsia, delivered  Additional problems: ***    Discharge diagnosis: {DX.:23714}                                              Post partum procedures:{Postpartum procedures:23558} Augmentation: {{NGEXBMWUXLKG:40102}Complications: {OB Labor/Delivery Complications:20784}  Hospital course: {Courses:23701}  Magnesium Sulfate received: {Mag received:30440022} BMZ received: {BMZ received:30440023} Rhophylac:{Rhophylac received:30440032} MMR:{MMR:30440033} T-DaP:{Tdap:23962} Flu: {{VOZ:36644}Transfusion:{Transfusion received:30440034}  Physical exam  Vitals:   09/22/20 2030 09/23/20 0229 09/23/20 0230 09/23/20 0632  BP: 128/88 118/79  130/80  Pulse: 95 89  (!) 113  Resp: _0 Temp: 98.1 F (36.7 C) 98.3 F (36.8 C)  98.9 F (37.2 C)  TempSrc: Oral Oral  Oral  SpO2: 100%  98% 98%  Weight:       General: {Exam; general:21111117} Lochia: {Desc; appropriate/inappropriate:30686::"appropriate"} Uterine Fundus: {Desc; firm/soft:30687} Incision: {Exam; incision:21111123} DVT Evaluation: {Exam; dvt:2111122} Labs: Lab Results  Component Value Date   WBC 20.7 (H) 09/21/2020   HGB 11.3 (L) 09/21/2020   HCT 32.6 (L) 09/21/2020   MCV 94.2 09/21/2020   PLT 237 09/21/2020   CMP Latest Ref Rng & Units 09/21/2020  Glucose 70 - 99 mg/dL 105(H)  BUN 6 - 20 mg/dL 10  Creatinine 0.44 - 1.00 mg/dL 0.91  Sodium 135 - 145 mmol/L 134(L)  Potassium 3.5 - 5.1  mmol/L 4.4  Chloride 98 - 111 mmol/L 105  CO2 22 - 32 mmol/L 21(L)  Calcium 8.9 - 10.3 mg/dL 7.7(L)  Total Protein 6.5 - 8.1 g/dL 5.7(L)  Total Bilirubin 0.3 - 1.2 mg/dL 0.7  Alkaline Phos 38 - 126 U/L 110  AST 15 - 41 U/L 30  ALT 0 - 44 U/L 17   Edinburgh Score: Edinburgh Postnatal Depression Scale Screening Tool 09/22/2020  I have been able to laugh and see the funny side of things. 0  I have looked forward with enjoyment to things. 0  I have blamed myself unnecessarily when things went wrong. 1  I have been anxious or worried for no good reason. 0  I have felt scared or panicky for no good reason. 1  Things have been getting on top of me. 1  I have been so unhappy that I have had difficulty sleeping. 0  I have felt sad or miserable. 0  I have been so unhappy that I have been crying. 0  The thought of harming myself has occurred to me. 0  Edinburgh Postnatal Depression Scale Total 3     After visit meds:  Allergies as of 09/23/2020   No Known Allergies     Medication List    STOP taking these medications   aspirin 81 MG EC tablet   ondansetron 4 MG disintegrating tablet Commonly known as: Zofran ODT     TAKE these medications   cetirizine 10 MG tablet Commonly known as: ZYRTEC Take  1 tablet (10 mg total) by mouth daily.   cyclobenzaprine 5 MG tablet Commonly known as: FLEXERIL Take 1 tablet (5 mg total) by mouth at bedtime as needed for muscle spasms.   hydrochlorothiazide 25 MG tablet Commonly known as: HYDRODIURIL Take 1 tablet (25 mg total) by mouth daily.   ibuprofen 600 MG tablet Commonly known as: ADVIL Take 1 tablet (600 mg total) by mouth every 6 (six) hours.   multivitamin-prenatal 27-0.8 MG Tabs tablet Take 1 tablet by mouth daily at 12 noon.   NIFEdipine 60 MG 24 hr tablet Commonly known as: ADALAT CC Take 1 tablet (60 mg total) by mouth 2 (two) times daily. What changed:   medication strength  how much to take   oxyCODONE-acetaminophen  5-325 MG tablet Commonly known as: PERCOCET/ROXICET Take 2 tablets by mouth every 4 (four) hours as needed for severe pain ((when tolerating fluids)).            Discharge Care Instructions  (From admission, onward)         Start     Ordered   09/23/20 0000  Leave dressing on - Keep it clean, dry, and intact until clinic visit        09/23/20 0756           Discharge home in stable condition Infant Feeding: {Baby feeding:23562} Infant Disposition:{CHL IP OB HOME WITH QJSIDX:95844} Discharge instruction: per After Visit Summary and Postpartum booklet. Activity: Advance as tolerated. Pelvic rest for 6 weeks.  Diet: {OB BNLW:78718367} Future Appointments:No future appointments. Follow up Visit:  [place] [facility] [place] [facility] Follow up on 09/27/2020.   Specialty: Obstetrics and Gynecology Why: Incision and BP check Contact information: [place] [phone]               Please schedule this patient for a {Visit type:23955} postpartum visit in {Postpartum visit:23953} with the following provider: {Provider type:23954}. Additional Postpartum F/U:{PP Procedure:23957}  {Risk AFOAD:52589} pregnancy complicated by: {UQXAFHSVEXOG:00298} Delivery mode:  C-Section, Low Transverse  Anticipated Birth Control:  {Birth Control:23956}   09/23/2020 Florian Buff, MD

## 2020-09-24 ENCOUNTER — Ambulatory Visit: Payer: Self-pay

## 2020-09-24 LAB — SURGICAL PATHOLOGY

## 2020-09-24 NOTE — Lactation Note (Signed)
This note was copied from a baby's chart. Lactation Consultation Note  Patient Name: Kelly Lane IWLNL'G Date: 09/24/2020 Reason for consult: Follow-up assessment;NICU baby;Late-preterm 34-36.6wks;Infant < 6lbs Age:26 days  LC in to visit with P1 Mom in the NICU.    Baby sleeping STS on Mom's chest currently with gavage feeding running.  Mom hasn't been consistently pumping, only pumped twice yesterday.  Mom hasn't obtained a WIC pump yet.  Showed Mom the hand pump part and how to use.  Mom also informed of pump rentals available from gift shop.  Assisted with pumping after STS and volume of milk increasing.   Breasts full, but not engorged.  Mom to pump on maintenance setting.   Left upper quadrant lump palpated.  Mom states she noticed this during pregnancy.  Urged Mom to notify her OB/GYN regarding this.  Encouraged double pumping every 2-3 hrs during the day and 3-4 hrs at night.  Talked about importance of supporting her milk supply.   Mom on the phone with [facility] currently.   Lactation Tools Discussed/Used Tools: Pump;Flanges Flange Size: 24 Breast pump type: Double-Electric Breast Pump Pumping frequency: 2 X yesterday Pumped volume: 30 mL  Interventions Interventions: Skin to skin;Breast massage;Hand express;DEBP;Education  Consult Status Consult Status: Follow-up Date: 09/25/20 Follow-up type: [place] 09/24/2020, 12:47 PM

## 2020-09-25 ENCOUNTER — Ambulatory Visit: Payer: Self-pay

## 2020-09-25 NOTE — Lactation Note (Signed)
This note was copied from a baby's chart. Lactation Consultation Note LC to room for assistance with pumping. Will plan return visit to assist with position / latch.  Patient Name: Kelly Lane RTMYT'R Date: 09/25/2020 Reason for consult: NICU baby;Follow-up assessment;Mother's request Age:26 days  Maternal Data  Milk is in. Mother denies engorgement. Is pumping q3  Feeding Mother's Current Feeding Choice: Breast Milk  Interventions Interventions: Breast feeding basics reviewed;DEBP   Consult Status Consult Status: Follow-up Follow-up type: In-patient 5-25 @ [address] [place], [place] IBCLC 09/25/2020, 10:23 AM

## 2020-09-27 ENCOUNTER — Ambulatory Visit: Payer: BC Managed Care – PPO

## 2020-09-27 ENCOUNTER — Other Ambulatory Visit: Payer: Self-pay

## 2020-09-27 VITALS — BP 115/82 | HR 103

## 2020-09-27 DIAGNOSIS — Z0130 Encounter for examination of blood pressure without abnormal findings: Secondary | ICD-10-CM

## 2020-09-27 NOTE — Progress Notes (Addendum)
Pt is in the office for BP and incision check from c-section on 09-20-20 BP today is 115/82 and pt is taking medications as prescribed, denies any abnormal symptoms. Honeycomb dressing removed today and incision is clean, dry, and intact. Advised to monitor for signs of infection and keep incision clean and dry, pt agreed. PP scheduled for 10-18-20  Patient was assessed and managed by nursing staff during this encounter. I have reviewed the chart and agree with the documentation and plan. I have also made any necessary editorial changes.  Baltazar Najjar, MD 09/28/2020 8:42 AM

## 2020-10-01 ENCOUNTER — Inpatient Hospital Stay (HOSPITAL_COMMUNITY)
Admission: AD | Admit: 2020-10-01 | Discharge: 2020-10-01 | Disposition: A | Discharge status: Home / Self Care | Payer: BC Managed Care – PPO | Attending: Obstetrics and Gynecology | Admitting: Obstetrics and Gynecology

## 2020-10-01 ENCOUNTER — Ambulatory Visit: Payer: Self-pay

## 2020-10-01 DIAGNOSIS — O99893 Other specified diseases and conditions complicating puerperium: Secondary | ICD-10-CM

## 2020-10-01 DIAGNOSIS — O9089 Other complications of the puerperium, not elsewhere classified: Secondary | ICD-10-CM | POA: Diagnosis not present

## 2020-10-01 DIAGNOSIS — R200 Anesthesia of skin: Secondary | ICD-10-CM | POA: Insufficient documentation

## 2020-10-01 DIAGNOSIS — G5601 Carpal tunnel syndrome, right upper limb: Secondary | ICD-10-CM | POA: Diagnosis not present

## 2020-10-01 NOTE — MAU Note (Signed)
Pt reports numbness in right thumb that started 2 days ago, slight tingle, no pain. No wrist pain, arm, or neck pain. PP symptoms and vitals  WNL.

## 2020-10-01 NOTE — MAU Provider Note (Signed)
Patient Kelly Lane is a 25 y.o. [phone] at  8 days postpartum from a primary C/S for pre-e with severe features. She states she had her C/section because baby could not tolerate labor. Delivery was at 33 weeks. Baby is currently in NICU.   She denies any HA, blurry vision, floating spots. She denies fever, SOB, chest pain. She is ambulating in the room without complaint. She was discharged home on nifedipine and hydrodiuril. She has been taking her medicines at prescribed.   Patient states that her right thumb started feeling numb two days ago. She did not have an IV placed on that side recently. She denies loss of mobility, pain, no trauma to the thumb. She is here because she is concerned about a possible stroke.   Right thumb is intact, skin with appropriate color, non-tender, full range of motion.   Patient Vitals for the past 24 hrs:  BP Temp Pulse Resp SpO2 Height Weight  10/01/20 2004 (!) 124/95 97.6 F (36.4 C) 89 16 100 % 5\' 2"  (1.575 m) 65.7 kg   Explained to patient that she is most likely having carpal tunnel syndrom postpartum; explained that this could be due to pumping, infant care, repetitive motion or  normal changes in fluid postpartum, or idiopathic. Reassured patient that an isolated numbness in her thumb is unlikely to be signs of impending stroke or heart attack, and that she can follow up with PT or PCP at her convenience. Recommended that patient try ice, tylenol and pay attention to possible triggers.   [name] 10/01/2020, 8:47 PM

## 2020-10-01 NOTE — Lactation Note (Signed)
This note was copied from a baby's chart. Lactation Consultation Note  Patient Name: Kelly Lane YMEBR'A Date: 10/01/2020 Reason for consult: Other (Comment) (NICU RN Nyoka Cowden called LC to set up feeding assess appt and questionable need for sizing of a NS. [place] asked RN to place appt time on dry eraser board in room - 5/31 830am) Age:26 days  Maternal Data    Feeding Mother's Current Feeding Choice: Breast Milk Nipple Type: Nfant Extra Slow Flow (gold)  LATCH Score                    Lactation Tools Discussed/Used    Interventions    Discharge    Consult Status Consult Status: Follow-up Date: 10/02/20 (at 8;30am) Follow-up type: [place] 10/01/2020, 3:29 PM

## 2020-10-01 NOTE — Discharge Instructions (Signed)
Carpal Tunnel Syndrome  Carpal tunnel syndrome is a condition that causes pain, weakness, and numbness in your hand and arm. Numbness is when you cannot feel an area in your body. The carpal tunnel is a narrow area that is on the palm side of your wrist. Repeated wrist motion or certain diseases may cause swelling in the tunnel. This swelling can pinch the main nerve in the wrist. This nerve is called the median nerve. What are the causes? This condition may be caused by:  Moving your hand and wrist over and over again while doing a task.  Injury to the wrist.  Arthritis.  A sac of fluid (cyst) or abnormal growth (tumor) in the carpal tunnel.  Fluid buildup during pregnancy.  Use of tools that vibrate. Sometimes the cause is not known. What increases the risk? The following factors may make you more likely to have this condition:  Having a job that makes you do these things: ? Move your hand over and over again. ? Work with tools that vibrate, such as drills or sanders.  Being a woman.  Having diabetes, obesity, thyroid problems, or kidney failure. What are the signs or symptoms? Symptoms of this condition include:  A tingling feeling in your fingers.  Tingling or loss of feeling in your hand.  Pain in your entire arm. This pain may get worse when you bend your wrist and elbow for a long time.  Pain in your wrist that goes up your arm to your shoulder.  Pain that goes down into your palm or fingers.  Weakness in your hands. You may find it hard to grab and hold items. You may feel worse at night. How is this treated? This condition may be treated with:  Lifestyle changes. You will be asked to stop or change the activity that caused your problem.  Doing exercises and activities that make bones, muscles, and tendons stronger (physical therapy).  Learning how to use your hand again (occupational therapy).  Medicines for pain and swelling. You may have injections in  your wrist.  A wrist splint or brace.  Surgery. Follow these instructions at home: If you have a splint or brace:  Wear the splint or brace as told by your doctor. Take it off only as told by your doctor.  Loosen the splint if your fingers: ? Tingle. ? Become numb. ? Turn cold and blue.  Keep the splint or brace clean.  If the splint or brace is not waterproof: ? Do not let it get wet. ? Cover it with a watertight covering when you take a bath or a shower. Managing pain, stiffness, and swelling If told, put ice on the painful area:  If you have a removable splint or brace, remove it as told by your doctor.  Put ice in a plastic bag.  Place a towel between your skin and the bag.  Leave the ice on for 20 minutes, 2-3 times per day. Do not fall asleep with the cold pack on your skin.  Take off the ice if your skin turns bright red. This is very important. If you cannot feel pain, heat, or cold, you have a greater risk of damage to the area. Move your fingers often to reduce stiffness and swelling.   General instructions  Take over-the-counter and prescription medicines only as told by your doctor.  Rest your wrist from any activity that may cause pain. If needed, talk with your boss at work about changes that can   help your wrist heal.  Do exercises as told by your doctor, physical therapist, or occupational therapist.  Keep all follow-up visits. Contact a doctor if:  You have new symptoms.  Medicine does not help your pain.  Your symptoms get worse. Get help right away if:  You have very bad numbness or tingling in your wrist or hand. Summary  Carpal tunnel syndrome is a condition that causes pain in your hand and arm.  It is often caused by repeated wrist motions.  Lifestyle changes and medicines are used to treat this problem. Surgery may help in very bad cases.  Follow your doctor's instructions about wearing a splint, resting your wrist, keeping follow-up  visits, and calling for help. This information is not intended to replace advice given to you by your health care provider. Make sure you discuss any questions you have with your health care provider. Document Revised: 09/01/2019 Document Reviewed: 09/01/2019 Elsevier Patient Education  2021 Elsevier Inc.  

## 2020-10-02 ENCOUNTER — Ambulatory Visit: Payer: Self-pay

## 2020-10-02 NOTE — Lactation Note (Signed)
This note was copied from a baby's chart. Lactation Consultation Note  Patient Name: Kelly Lane SNKNL'Z Date: 10/02/2020 Reason for consult: Follow-up assessment;Mother's request;Primapara;1st time breastfeeding;NICU baby;Preterm <34wks Age:26 days   Kelly Lane requested lactation to assist with breast feeding. I assisted with latching Kelly Lane in football hold on the right breast. He latched without the aid of a nipple shield, but I did need to compress breast tissue to help him maintain his latch. He would break latch without some support.  He fed for several minutes, and then we discussed the use of a nipple shield to help him maintain his latch. When he released the breast, I showed Kelly Lane how to place a size 20 nipple shield on the breast. At this time, we did not observe Kelly Lane latch to the shield; he fell asleep. The RN entered and initiated gavage feeding.  I returned later to observe Kelly Lane pump. She states that she is pumping 3 times a day and obtaining 90-120 mls/session. I encouraged her to pump 8 times a day and discussed the implications of pumping frequency on her milk production.  I observed her pump. She is using size 27 flanges. They appeared slightly large; Kelly Lane states that it's uncomfortable to pump. We experimented with size 24 flanges, and noted milk leaking underneath the flange. We discontinued using this size.  Kelly Lane primarily notes pumping discomfort in the first few minutes of pumping. I recommended using some coconut oil on her flanges to reduce discomfort, and I also recommended starting at a lower suction initially and then increasing as needed.  Maternal Data Has patient been taught Hand Expression?: Yes Does the patient have breastfeeding experience prior to this delivery?: No  Feeding Mother's Current Feeding Choice: Breast Milk  LATCH Score Latch: Repeated attempts needed to sustain latch, nipple held in mouth throughout feeding, stimulation needed to  elicit sucking reflex.  Audible Swallowing: None  Type of Nipple: Everted at rest and after stimulation  Comfort (Breast/Nipple): Soft / non-tender  Hold (Positioning): Assistance needed to correctly position infant at breast and maintain latch.  LATCH Score: 6   Lactation Tools Discussed/Used Tools: Pump;Flanges Flange Size: 27 Breast pump type: Double-Electric Breast Pump Pump Education: Setup, frequency, and cleaning Reason for Pumping: NICU Pumping frequency: 3 times/day Pumped volume: 90 mL (90-120 mls/session)  Interventions Interventions: Breast feeding basics reviewed;Assisted with latch;Skin to skin;Hand express;Breast compression;Adjust position;Support pillows;DEBP;Education  Discharge    Consult Status Consult Status: Follow-up Date: 10/04/20 Follow-up type: In-patient    Lenore Manner 10/02/2020, 10:22 AM

## 2020-10-03 ENCOUNTER — Ambulatory Visit: Payer: Self-pay

## 2020-10-03 NOTE — Lactation Note (Signed)
This note was copied from a baby's chart. Lactation Consultation Note LC to room for observed bf. Baby on with rhythmic suckling and audible swallowing for about 9 minutes. Mom pumping infrequently and trending toward engorgement. Will plan f/u tomorrow to provide additional support.   Patient Name: Boy Eman Rynders DGLOV'F Date: 10/03/2020 Reason for consult: NICU baby;Follow-up assessment Age:26 days  Maternal Data  pumping 2-3x day Pumped after bf'ing with >10oz yield  Feeding Mother's Current Feeding Choice: Breast Milk  LATCH Score Latch: Grasps breast easily, tongue down, lips flanged, rhythmical sucking.  Audible Swallowing: Spontaneous and intermittent  Type of Nipple: Everted at rest and after stimulation  Comfort (Breast/Nipple): Soft / non-tender  Hold (Positioning): Assistance needed to correctly position infant at breast and maintain latch.  LATCH Score: 9   Interventions Interventions: Breast feeding basics reviewed;Support pillows;Education;Position options;Breast compression;DEBP;Ice;Adjust position;Breast massage;Skin to skin;Expressed milk;Assisted with latch  Discharge Discharge Education: Engorgement and breast care  Consult Status Consult Status: Follow-up Follow-up type: In-patient   Gwynne Edinger, MA IBCLC 10/03/2020, 4:26 PM

## 2020-10-05 ENCOUNTER — Ambulatory Visit: Payer: Self-pay

## 2020-10-05 NOTE — Lactation Note (Signed)
This note was copied from a baby's chart. Lactation Consultation Note  Patient Name: Kelly Lane WPYKD'X Date: 10/05/2020 Reason for consult: Follow-up assessment;Primapara;1st time breastfeeding;NICU baby;Late-preterm 34-36.6wks;Infant < 6lbs Age:26 wk.o.   RN requested Mildred assistance with breastfeeding baby. Mom pumped 4 hrs prior to feeding.  Breasts full, not engorged.  Positioned baby in football hold on right breast.  Mom has large, heavy breasts with erect nipples and compressible areola.   Mom had been using a 20 mm nipple shield.  LC felt this shield was too large and went to try a 16 mm shield.   Baby latched independently with a wide latch to base of nipple onto areola.  Baby noted to suck with jaw extensions.  No swallows identified, but baby sucked on breast for 3 mins before having increased WOB and falling asleep.  Placed baby STS on Mom's chest.    Provided a belly band and created a hand's free pumping band.  Mom's pumping bra tight.  Concerned about compressing milk ducts as Mom has had some plugged ducts.  Warm compress placed over full duct on left breast.   Assisted Mom with double pumping hand's free.  Mom to use maintenance setting for 15-30 mins.  Baby placed between her breasts during pumping for gavage feeding.  LC palpated full duct and it had dissipated.   Feeding Nipple Type: Nfant Extra Slow Flow (gold)  LATCH Score Latch: Repeated attempts needed to sustain latch, nipple held in mouth throughout feeding, stimulation needed to elicit sucking reflex.  Audible Swallowing: None  Type of Nipple: Everted at rest and after stimulation  Comfort (Breast/Nipple): Soft / non-tender  Hold (Positioning): Assistance needed to correctly position infant at breast and maintain latch.  LATCH Score: 6    Interventions Interventions: Breast feeding basics reviewed;Assisted with latch;Breast massage;Hand express;Skin to skin;Adjust position;Support pillows;Position  options;DEBP   Consult Status Consult Status: Follow-up Date: 10/12/20 Follow-up type: In-patient    [name] 10/05/2020, 6:35 PM

## 2020-10-07 ENCOUNTER — Ambulatory Visit: Payer: Self-pay

## 2020-10-07 NOTE — Lactation Note (Signed)
This note was copied from a baby's chart. Lactation Consultation Note  Patient Name: Kelly Lane UGQBV'Q Date: 10/07/2020 Reason for consult: Follow-up assessment;Primapara;1st time breastfeeding;Late-preterm 34-36.6wks;NICU baby;Infant < 6lbs;Mother's request Age:26 wk.o.  Visited with mom of 53 1/23 (adjusted) week old baby, she's a P1 and called LC for assistance. Mom reported she had left her belly band (pumping bra) at home and she needed another one to pump today while in the NICU.  LC got another belly band for her in size L; and made a pumping bra. Mom started pumping during [facility] consultation, praised her for her efforts. Advised mom to leave this belly band in baby's room to have it available when she comes to visit him, and to keep the other one at home. Mom was very grateful.  She reports she's been pumping about 3-4 oz/combined per pumping session; she tries to pump every 3 hours but sometimes "it varies".   Feeding plan:  1. Encouraged mom to pump every 3 hours, at least 8 pumping sessions/24 hours 2. Mom will call for feeding assist when needed, she's been putting baby to breast using a NS # 16-20 3. When asked for her preference, she voiced she'd like to be F/U by lactation twice a week  No support person other than mom at the time of [facility] consultation. Mom reported all questions and concerns were answered, she's aware of [place] OP services and will call PRN.  Maternal Data    Feeding Mother's Current Feeding Choice: Breast Milk Nipple Type: Nfant Extra Slow Flow (gold)  LATCH Score                    Lactation Tools Discussed/Used Tools: Pump;Flanges Flange Size: 27 Breast pump type: Double-Electric Breast Pump Pump Education: Setup, frequency, and cleaning;Milk Storage Reason for Pumping: NICU infant < 5 lbs Pumping frequency: q 3 hours Pumped volume: 90 mL (90-120)  Interventions Interventions: Breast feeding basics  reviewed;DEBP;Education  Discharge Pump: DEBP  Consult Status Consult Status: Follow-up Date: 10/11/20 Follow-up type: In-patient    Tandi Hanko Francene Boyers 10/07/2020, 3:22 PM

## 2020-10-09 ENCOUNTER — Ambulatory Visit: Payer: Self-pay

## 2020-10-09 NOTE — Lactation Note (Signed)
This note was copied from a baby's chart. Lactation Consultation Note  Patient Name: Kelly Lane Date: 10/09/2020 Reason for consult: Follow-up assessment;1st time breastfeeding;NICU baby;Preterm <34wks Age:26 wk.o.   I followed up with Kelly Lane and her 49 week old, Kelly Lane. She was resting upon entry, and it was baby's appointed feeding time/touch time. Her RN prepared baby for a feeding, and I asked her to share her concerns about breast pain and pumping. Kelly Lane states that she had a conversation yesterday with an Kelly Lane that provided her with some good strategies to decrease breast pain with pumping and engorgement.  Kelly Lane states that she's attempting to pump every three hours. She endorses fatigue. Last night she pumped at 9, 12 and 3 am. She missed the 6 am pumping session due to fatigue. I praised her for her dedication to maintaining the pumping schedule and validated her feelings of fatigue.  I offered to assist with latching baby. Kelly Lane prefers to latch without the aid of a NS. She latched baby in cross cradle hold on the left breast. I noted that baby did latch and move breast tissue; however the latch was shallow. I encouraged her to move her hand from a "C" position to a "U" position when cupping the breast.  Kelly Lane states that she still feels like she does not know what to do with her hands when breast feeding. I praised her for allowing baby to practice his latch and suggested that we could work on positioning more in the next visits.   Baby latched a few minutes and then fell asleep. I encouraged Kelly Lane to pump afterwards, and I cleaned her flanges and pump parts. I then returned to her a little later in the morning to provide some ice for her breasts.   Maternal Data Has patient been taught Hand Expression?: Yes Does the patient have breastfeeding experience prior to this delivery?: No  Feeding Mother's Current Feeding Choice: Breast Milk  LATCH Score Latch: Repeated  attempts needed to sustain latch, nipple held in mouth throughout feeding, stimulation needed to elicit sucking reflex.  Audible Swallowing: A few with stimulation  Type of Nipple: Everted at rest and after stimulation  Comfort (Breast/Nipple): Soft / non-tender  Hold (Positioning): Assistance needed to correctly position infant at breast and maintain latch.  LATCH Score: 7   Lactation Tools Discussed/Used Breast pump type: Double-Electric Breast Pump Pump Education: Setup, frequency, and cleaning Reason for Pumping: support milk production - NICU Pumping frequency:  (goal is q3 - some longer gaps at nighttime)  Interventions Interventions: Breast feeding basics reviewed;Assisted with latch;Skin to skin;Breast compression;Support pillows;Education  Discharge Pump: DEBP  Consult Status Consult Status: Follow-up Date: 10/11/20 Follow-up type: In-patient    Lenore Manner 10/09/2020, 9:49 AM

## 2020-10-16 ENCOUNTER — Ambulatory Visit (INDEPENDENT_AMBULATORY_CARE_PROVIDER_SITE_OTHER): Payer: BC Managed Care – PPO | Admitting: Licensed Clinical Social Worker

## 2020-10-16 DIAGNOSIS — Z9189 Other specified personal risk factors, not elsewhere classified: Secondary | ICD-10-CM

## 2020-10-17 ENCOUNTER — Ambulatory Visit: Payer: Self-pay

## 2020-10-17 NOTE — Lactation Note (Signed)
This note was copied from a baby's chart. Lactation Consultation Note Apt scheduled to assist with breastfeeding.  Patient Name: Kelly Lane XIHWT'[id number] Date: 10/17/2020 Reason for consult: Follow-up assessment Age:26 wk.o.  Maternal Data Milk supply is wnl   Feeding Mother's Current Feeding Choice: Breast Milk Nipple Type: Dr. Myra Gianotti Preemie   Lactation Tools Discussed/Used Pumping frequency: 6x Pumped volume: 90 mL  Interventions Interventions: Education   Consult Status Consult Status: Follow-up Date: 10/20/20 (1200) Follow-up type: [place], [place] [facility] 10/17/2020, 8:54 AM

## 2020-10-18 ENCOUNTER — Ambulatory Visit (INDEPENDENT_AMBULATORY_CARE_PROVIDER_SITE_OTHER): Payer: BC Managed Care – PPO | Admitting: Obstetrics

## 2020-10-18 ENCOUNTER — Encounter: Payer: Self-pay | Admitting: Obstetrics

## 2020-10-18 ENCOUNTER — Other Ambulatory Visit: Payer: Self-pay

## 2020-10-18 DIAGNOSIS — Z30011 Encounter for initial prescription of contraceptive pills: Secondary | ICD-10-CM | POA: Diagnosis not present

## 2020-10-18 DIAGNOSIS — Z3009 Encounter for other general counseling and advice on contraception: Secondary | ICD-10-CM

## 2020-10-18 DIAGNOSIS — E569 Vitamin deficiency, unspecified: Secondary | ICD-10-CM

## 2020-10-18 DIAGNOSIS — Z8759 Personal history of other complications of pregnancy, childbirth and the puerperium: Secondary | ICD-10-CM

## 2020-10-18 DIAGNOSIS — R300 Dysuria: Secondary | ICD-10-CM | POA: Diagnosis not present

## 2020-10-18 DIAGNOSIS — R39198 Other difficulties with micturition: Secondary | ICD-10-CM

## 2020-10-18 LAB — POCT URINALYSIS DIPSTICK
Bilirubin, UA: NEGATIVE
Blood, UA: NEGATIVE
Glucose, UA: NEGATIVE
Ketones, UA: NEGATIVE
Leukocytes, UA: NEGATIVE
Nitrite, UA: NEGATIVE
Protein, UA: POSITIVE — AB
Spec Grav, UA: 1.02 (ref 1.010–1.025)
Urobilinogen, UA: 0.2 E.U./dL
pH, UA: 6 (ref 5.0–8.0)

## 2020-10-18 LAB — POCT URINE PREGNANCY: Preg Test, Ur: NEGATIVE

## 2020-10-18 MED ORDER — PRENATAL PLUS 27-1 MG PO TABS: 1.0000 | ORAL_TABLET | Freq: Every day | ORAL | 4 refills | Status: DC

## 2020-10-18 MED ORDER — NORETHINDRONE 0.35 MG PO TABS: 1.0000 | ORAL_TABLET | Freq: Every day | ORAL | 11 refills | Status: DC

## 2020-10-18 NOTE — Progress Notes (Signed)
..   [facility] Partum Visit Note  Kelly Lane is a 25 y.o. [phone] female who presents for a postpartum visit. She is 4 weeks postpartum following a primary cesarean section.  I have fully reviewed the prenatal and intrapartum course. The delivery was at 33.5 gestational weeks.  Anesthesia: spinal. Postpartum course has been challenging. Baby is doing well in NICU. Baby is feeding by breast. Bleeding staining only. Bowel function is abnormal: constipated . Bladder function is abnormal: feels pressure . Patient is not sexually active. Contraception method is  wants nexplanon . Postpartum depression screening: negative.   The pregnancy intention screening data noted above was reviewed. Potential methods of contraception were discussed. The patient elected to proceed with Oral Contraceptive.    Edinburgh Postnatal Depression Scale - 10/18/20 0925       Edinburgh Postnatal Depression Scale:  In the Past 7 Days   I have been able to laugh and see the funny side of things. 0    I have looked forward with enjoyment to things. 0    I have blamed myself unnecessarily when things went wrong. 0    I have been anxious or worried for no good reason. 0    I have felt scared or panicky for no good reason. 0    Things have been getting on top of me. 0    I have been so unhappy that I have had difficulty sleeping. 0    I have felt sad or miserable. 0    I have been so unhappy that I have been crying. 0    The thought of harming myself has occurred to me. 0    Edinburgh Postnatal Depression Scale Total 0             Health Maintenance Due  Topic Date Due   HPV VACCINES (1 - 2-dose series) Never done    The following portions of the patient's history were reviewed and updated as appropriate: allergies, current medications, past family history, past medical history, past social history, past surgical history, and problem list.  Review of Systems A comprehensive review of systems was  negative.  Objective:  BP 110/79   Pulse 66   Ht 5\' 2"  (1.575 m)   Wt 142 lb 6.4 oz (64.6 kg)   Breastfeeding Yes   BMI 26.05 kg/m    General:  alert and no distress   Breasts:  normal  Lungs: clear to auscultation bilaterally  Heart:  regular rate and rhythm, S1, S2 normal, no murmur, click, rub or gallop  Abdomen: soft, non-tender; bowel sounds normal; no masses,  no organomegaly and incision is clean, dry, intact, and non tender    Wound well approximated incision  GU exam:  not indicated       Assessment:    1. Postpartum care following cesarean delivery - doing well  2. H/O severe pre-eclampsia - BP clinically stable  3. Vitamin deficiency Rx: - prenatal vitamin w/FE, FA (PRENATAL 1 + 1) 27-1 MG TABS tablet; Take 1 tablet by mouth daily before breakfast.  Dispense: 90 tablet; Refill: 4  4. Encounter for counseling regarding contraception - wants an oral contraceptive c/w breast feeding  5. Encounter for initial prescription of contraceptive pills Rx: - norethindrone (MICRONOR) 0.35 MG tablet; Take 1 tablet (0.35 mg total) by mouth daily.  Dispense: 28 tablet; Refill: 11    Plan:   Essential components of care per ACOG recommendations:  1.  Mood and well being: Patient  with negative depression screening today.  - Patient tobacco use? No.   - hx of drug use? No.    2. Infant care and feeding:  -Patient currently breastmilk feeding? Yes. Discussed returning to work and pumping. Reviewed importance of draining breast regularly to support lactation.  -Social determinants of health (SDOH) reviewed in EPIC. No concerns  3. Sexuality, contraception and birth spacing - Patient does not want a pregnancy in the next year.  Desired family size is 1 children.  - Reviewed forms of contraception in tiered fashion. Patient desired oral progesterone-only contraceptive today.   - Discussed birth spacing of 18 months  4. Sleep and fatigue -Encouraged  family/partner/community support of 4 hrs of uninterrupted sleep to help with mood and fatigue  5. Physical Recovery  - Discussed patients delivery and complications. She describes her labor as bad. - Patient had a C-section emergent.  - Patient has urinary incontinence? No. - Patient is not safe to resume physical and sexual activity  6.  Health Maintenance - HM due items addressed Yes - Last pap smear  Diagnosis  Date Value Ref Range Status  01/13/2020   Final   - Negative for intraepithelial lesion or malignancy (NILM)   Pap smear not done at today's visit.  -Breast Cancer screening indicated? No.   7. Chronic Disease/Pregnancy Condition follow up: None    Baltazar Najjar, [place] for [facility], [place] [facility] 10/18/20

## 2020-10-18 NOTE — Progress Notes (Signed)
Error

## 2020-10-20 ENCOUNTER — Ambulatory Visit: Payer: Self-pay

## 2020-10-20 NOTE — Lactation Note (Signed)
This note was copied from a baby's chart. Lactation Consultation Note  Patient Name: Kelly Lane WIOMB'T Date: 10/20/2020   Age:26 wk.o.  Visited with mom of 16 5/10 weeks old (adjusted age) NICU female, she's a P1 and requested a feeding assist for today at noon.  However, when [facility] came in the room, mom voiced that baby's feeding schedule has been changed to the 8-11-2-5 instead of the 9-12-3-6 and that baby already ate.  LC checked with RN to see if we can still try BF since baby is on at lib feedings now, but NICU RN advised to [facility] to try at 2 pm instead for baby's next feeding. LC to come back for a feeding assist at 2 pm.  Maternal Data    Feeding Nipple Type: Dr. Lavena Bullion   Lactation Tools Discussed/Used    Interventions    Discharge    Consult Status      Kelly Lane 10/20/2020, 12:08 PM

## 2020-10-20 NOTE — Lactation Note (Signed)
This note was copied from a baby's chart. Lactation Consultation Note  Patient Name: Kelly Lane [id number]'K Date: 10/20/2020 Reason for consult: Follow-up assessment;Early term 37-38.6wks;Infant < 6lbs;NICU baby;Primapara;1st time breastfeeding Age:26 wk.o.  Visited with mom of 62 0/7 (adjusted) NICU female, she's a P1 and requested another feeding assist at 2 pm.  LC took baby to mother's right breast in cross cradle position using NS # 24 but he wouldn't latch at first, it took a few attempts and some suck training to get him to latch.  Once he started sucking, multiple audible swallows were noted, mom also noted some leaking on the pillow (from colostrum pooled in NS # 24). Baby still working on his sucking skills, and he's on at lib feedings and taking breast (twice/day) and bottles (q 3 hours or as baby wants it). He fed for 18 minutes prior self-releasing from the breast.  NICU staff told mom that she can start taking her EBM home because they're not using all of them, praised her for her pumping efforts. Reviewed pumping schedule, lactogenesis III and OP LC referral. Mom plans on continuing pumping, putting baby to breast and offering breastmilk in a bottle.  Feeding plan:  Encouraged mom to pump every 3 hours, ideally 8 pumping sessions/24 hours She'll let her baby's RN know if she needs another feeding assist prior baby's discharge She'll continue putting baby to breast at lib, but mom is aware to limit BF sessions to no more than 20-30 minutes at a time She'll continue supplementing baby with her EBM using Dr. Owens Shark ultra preemie nipple  No other support person in the room other than mom at the time of [facility] consultation. Mom reported all questions and concerns were answered, she's aware of [facility] OP services and will call PRN.  Maternal Data    Feeding Mother's Current Feeding Choice: Breast Milk Nipple Type: Dr. Myra Gianotti Preemie  LATCH Score Latch: Repeated attempts needed to  sustain latch, nipple held in mouth throughout feeding, stimulation needed to elicit sucking reflex. (needed some suck training to start sucking on NS # 24)  Audible Swallowing: Spontaneous and intermittent  Type of Nipple: Everted at rest and after stimulation  Comfort (Breast/Nipple): Soft / non-tender  Hold (Positioning): Assistance needed to correctly position infant at breast and maintain latch.  LATCH Score: 8   Lactation Tools Discussed/Used Tools: Pump;Flanges Flange Size: 27 Breast pump type: Double-Electric Breast Pump Pump Education: Setup, frequency, and cleaning;Milk Storage Reason for Pumping: NICU baby Pumping frequency: q 4 hours (but mom was advised to pump every 3 hours) Pumped volume: 180 mL  Interventions Interventions: Breast feeding basics reviewed;Assisted with latch;Breast massage;Hand express;Breast compression;Adjust position;DEBP;Support pillows  Discharge Pump: DEBP  Consult Status Consult Status: Follow-up Date: 10/22/20 (baby is on at lib feedings) Follow-up type: In-patient    Kelly Lane 10/20/2020, 2:46 PM

## 2020-10-21 NOTE — Progress Notes (Signed)
CSW obtain "Discharge of Infant To Person Other Than The Birth Mother" form with MOB's and MD's (witness) signatures. CSW provided RN with copy of form and place original copy on [place], [place].   Please do not discharge infant in the morning until CSW has agreed that all requirements has been completed.    Darcus Austin, MSW, LCSW-A Clinical Social Worker- Weekends [phone]

## 2020-10-21 NOTE — BH Specialist Note (Signed)
Integrated Behavioral Health via Telemedicine Visit  10/21/2020 Kelly Lane [id number]  Number of [place] visits: 1/6 Session Start time: 10:30am  Session End time: 11:04am Total time: 34 mins via mychart video  Referring Provider: Johnney Ou MD Patient/Family location: Home  [facility] Provider location: [place]  All persons participating in visit: Pt D Such and LCSWA A. Linton Rump Types of Service: [place] (BHI)  I connected with [name] and/or [name]'s  via  Telephone or Geologist, engineering  (Video is Tree surgeon) and verified that I am speaking with the correct person using two identifiers. Discussed confidentiality: Yes   I discussed the limitations of telemedicine and the availability of in person appointments.  Discussed there is a possibility of technology failure and discussed alternative modes of communication if that failure occurs.  I discussed that engaging in this telemedicine visit, they consent to the provision of behavioral healthcare and the services will be billed under their insurance.  Patient and/or legal guardian expressed understanding and consented to Telemedicine visit: Yes   Presenting Concerns: Patient and/or family reports the following symptoms/concerns: newborn in nicu Duration of problem: approx 4 weeks ; Severity of problem: mild  Patient and/or Family's Strengths/Protective Factors: Concrete supports in place (healthy food, safe environments, etc.)  Goals Addressed: Patient will:  Reduce symptoms of: stress   Increase knowledge and/or ability of: healthy habits and coping skills   Demonstrate ability to: Increase adequate support systems for patient/family  Progress towards Goals: Ongoing  Interventions: Interventions utilized:  Motivational Interviewing Standardized Assessments completed: PHQ 9    Patient may benefit from integrated behavioral  health.  Plan: Follow up with behavioral health clinician on : as needed Behavioral recommendations: Engage in self care to prevent burnout, communicate needs with nicu social worker for added support, prioritize rest Referral(s): n/a  I discussed the assessment and treatment plan with the patient and/or parent/guardian. They were provided an opportunity to ask questions and all were answered. They agreed with the plan and demonstrated an understanding of the instructions.   They were advised to call back or seek an in-person evaluation if the symptoms worsen or if the condition fails to improve as anticipated.  [name], LCSW

## 2020-11-01 ENCOUNTER — Ambulatory Visit: Payer: BC Managed Care – PPO | Admitting: Obstetrics

## 2020-11-02 ENCOUNTER — Telehealth: Payer: Self-pay

## 2020-11-02 NOTE — Telephone Encounter (Signed)
Return call to [facility] pt regarding bleeding. Pt not ava not able to leave VM due to VM being full.

## 2020-11-03 ENCOUNTER — Telehealth: Payer: BC Managed Care – PPO | Admitting: Physician Assistant

## 2020-11-03 DIAGNOSIS — N939 Abnormal uterine and vaginal bleeding, unspecified: Secondary | ICD-10-CM

## 2020-11-04 ENCOUNTER — Encounter: Payer: Self-pay | Admitting: Physician Assistant

## 2020-11-04 NOTE — Progress Notes (Signed)
Based on what you shared with me, I feel your condition warrants further evaluation and I recommend that you be seen in a face to face visit.    Kelly Lane, I would advise that you have a face to face evaluation of your vaginal bleeding. This is not a symptom that can be evaluated through the Evisit.  Hope you feel better soon!    NOTE: There will be NO CHARGE for this eVisit   If you are having a true medical emergency please call 911.      For an urgent face to face visit, [facility] has six urgent care centers for your convenience:     [facility] [place] [facility] [place] Get Driving Directions [phone] [address] [place], [place] [address]    [facility] [place]) Get Driving Directions [phone] [place], [place] [address]  [facility] [place] ([place]) Get Driving Directions [phone] [address] [place],  [place]  [address]  [facility] Get Driving Directions [phone] [place], [place], [place] [address]   [facility] Get Driving Directions  [phone] [address] [place], [place] [address]   [facility] at  Get Driving Directions [phone] [address], [place], [place] [address]  Your MyChart E-visit questionnaire answers were reviewed by a board certified advanced clinical practitioner to complete your personal care plan based on your specific symptoms.  Thank you for using e-Visits.   I spent 5-10 minutes on review and completion of this note- Lacy Duverney [facility]

## 2020-11-06 ENCOUNTER — Telehealth: Payer: Self-pay | Admitting: *Deleted

## 2020-11-06 NOTE — Telephone Encounter (Signed)
Pt called to office with questions about PP bleeding.  Pt states she is taking BC as given, but having heavy vaginal bleeding with small clots.  Pt denies any severe pain and/or cramping.   Pt states she has been bleeding over a week now, would like recommendations. Pt advised the first few cycles after delivery can be a little heavier and longer than normal. Advised if she saturates more than 1 pad/hour she should be seen.   Advised will route message to provider for any further recommendations.   Please advise.

## 2020-11-09 ENCOUNTER — Encounter: Payer: Self-pay | Admitting: *Deleted

## 2020-11-09 NOTE — Telephone Encounter (Signed)
Mychart msg to be sent to pt with recommendation

## 2020-11-15 ENCOUNTER — Other Ambulatory Visit: Payer: Self-pay

## 2020-11-15 ENCOUNTER — Ambulatory Visit (HOSPITAL_COMMUNITY)
Admission: EM | Admit: 2020-11-15 | Discharge: 2020-11-15 | Discharge status: Against Medical Advice | Payer: BC Managed Care – PPO

## 2020-11-15 NOTE — ED Notes (Signed)
Patient access reports patient left due to wait time

## 2020-11-19 ENCOUNTER — Inpatient Hospital Stay (HOSPITAL_COMMUNITY)
Admission: AD | Admit: 2020-11-19 | Discharge: 2020-11-19 | Disposition: A | Discharge status: Home / Self Care | Payer: BC Managed Care – PPO | Attending: Obstetrics & Gynecology | Admitting: Obstetrics & Gynecology

## 2020-11-19 ENCOUNTER — Other Ambulatory Visit: Payer: Self-pay

## 2020-11-19 DIAGNOSIS — Z3202 Encounter for pregnancy test, result negative: Secondary | ICD-10-CM | POA: Insufficient documentation

## 2020-11-19 LAB — POCT PREGNANCY, URINE: Preg Test, Ur: NEGATIVE

## 2020-11-19 NOTE — MAU Provider Note (Signed)
[name] is a 25 y.o. [phone] female who presents to MAU today with complaint of VB. She is 2 months postpartum. She does not think she is pregnant. States the office told her to come to MAU. Has an appt at [facility] in 3 days but wanted to come in tonight "to get it over with".  ROS: +VB  O BP 127/85 (BP Location: Right Arm)   Pulse 88   Temp 98 F (36.7 C) (Oral)   Resp 20   Ht 5\' 2"  (1.575 m)   Wt 63.7 kg   BMI 25.70 kg/m  Physical Exam Constitutional:      General: She is not in acute distress.    Appearance: Normal appearance.  HENT:     Head: Normocephalic and atraumatic.  Cardiovascular:     Rate and Rhythm: Normal rate.  Pulmonary:     Effort: Pulmonary effort is normal. No respiratory distress.  Musculoskeletal:     Cervical back: Normal range of motion.  Neurological:     General: No focal deficit present.     Mental Status: She is alert and oriented to person, place, and time.  Psychiatric:        Mood and Affect: Mood normal.        Behavior: Behavior normal.   Results for orders placed or performed during the hospital encounter of 11/19/20 (from the past 24 hour(s))  Pregnancy, urine POC     Status: None   Collection Time: 11/19/20  8:01 PM  Result Value Ref Range   Preg Test, Ur NEGATIVE NEGATIVE    MDM: No signs of pregnancy and >6 weeks postpartum, offered emergent evaluation in ED but pt declines and will go to office this week.   A 1. Negative pregnancy test     P Discharge from MAU in stable condition Warning signs for worsening condition that would warrant emergency follow-up discussed Patient may return to MAU as needed for pregnancy related complaints Follow up at [facility] as scheduled  [name], [place] 11/19/2020 8:19 PM

## 2020-11-19 NOTE — MAU Note (Signed)
PT SAYS SHE DEL C/S ON 09-20-2020. ALL OK IN HOSPITAL BUT 2 WEEKS AGO- BLEEDING INCREASED PNC- [facility] IN TRIAGE- PAD ON- SMALL AMT DARK BLOOD SAYS HAS ODOR HAS PAIN - IN UPPER ABD TO PELVIC- NO MEDS   CALLED [facility]- 2 WEEKS AGO AND Thursday -  AND TODAY TOLD TO COME HERE.

## 2020-11-22 ENCOUNTER — Ambulatory Visit (INDEPENDENT_AMBULATORY_CARE_PROVIDER_SITE_OTHER): Payer: BC Managed Care – PPO | Admitting: Obstetrics

## 2020-11-22 ENCOUNTER — Encounter: Payer: Self-pay | Admitting: Obstetrics

## 2020-11-22 ENCOUNTER — Other Ambulatory Visit (HOSPITAL_COMMUNITY)
Admission: RE | Admit: 2020-11-22 | Discharge: 2020-11-22 | Disposition: A | Discharge status: Home / Self Care | Payer: BC Managed Care – PPO | Source: Ambulatory Visit | Attending: Obstetrics | Admitting: Obstetrics

## 2020-11-22 ENCOUNTER — Other Ambulatory Visit: Payer: Self-pay

## 2020-11-22 VITALS — BP 117/80 | HR 62 | Wt 139.0 lb

## 2020-11-22 DIAGNOSIS — Z113 Encounter for screening for infections with a predominantly sexual mode of transmission: Secondary | ICD-10-CM

## 2020-11-22 NOTE — Progress Notes (Signed)
[facility] Partum Visit Note  Kelly Lane is a 25 y.o. [phone] female who presents for a postpartum visit. She is 9 weeks postpartum following a primary cesarean section.  I have fully reviewed the prenatal and intrapartum course. The delivery was at 33 gestational weeks.  Anesthesia: spinal. Postpartum course has been unremarkable. Baby is doing well. Baby is feeding by breast. Bleeding staining only. Bowel function is normal. Bladder function is normal. Patient is sexually active. Contraception method is oral progesterone-only contraceptive. Postpartum depression screening: negative.   The pregnancy intention screening data noted above was reviewed. Potential methods of contraception were discussed. The patient elected to proceed with No data recorded.   Edinburgh Postnatal Depression Scale - 11/22/20 0840       Edinburgh Postnatal Depression Scale:  In the Past 7 Days   I have been able to laugh and see the funny side of things. 0    I have looked forward with enjoyment to things. 0    I have blamed myself unnecessarily when things went wrong. 0    I have been anxious or worried for no good reason. 0    I have felt scared or panicky for no good reason. 0    Things have been getting on top of me. 0    I have been so unhappy that I have had difficulty sleeping. 0    I have felt sad or miserable. 0    I have been so unhappy that I have been crying. 0    The thought of harming myself has occurred to me. 0    Edinburgh Postnatal Depression Scale Total 0             Health Maintenance Due  Topic Date Due   COVID-19 Vaccine (1) Never done   HPV VACCINES (1 - 2-dose series) Never done    The following portions of the patient's history were reviewed and updated as appropriate: allergies, current medications, past family history, past medical history, past social history, past surgical history, and problem list.  Review of Systems A comprehensive review of systems was  negative.  Objective:  BP 117/80   Pulse 62   Wt 139 lb (63 kg)   Breastfeeding Yes   BMI 25.42 kg/m    General:  alert and no distress   Breasts:  normal  Lungs: clear to auscultation bilaterally  Heart:  regular rate and rhythm, S1, S2 normal, no murmur, click, rub or gallop  Abdomen: soft, non-tender; bowel sounds normal; no masses,  no organomegaly   Wound well approximated incision, clean and non tender  GU exam:  normal       Assessment:    There are no diagnoses linked to this encounter.   Plan:   Essential components of care per ACOG recommendations:  1.  Mood and well being: Patient with negative depression screening today. Reviewed local resources for support.  - Patient tobacco use? No.   - hx of drug use? No.    2. Infant care and feeding:  -Patient currently breastmilk feeding? Yes. Discussed returning to work and pumping. Reviewed importance of draining breast regularly to support lactation.  -Social determinants of health (SDOH) reviewed in EPIC. No concerns  3. Sexuality, contraception and birth spacing - Patient does not want a pregnancy in the next year.  Desired family size is 2 children.  - Reviewed forms of contraception in tiered fashion. Patient desired no method today.   - Discussed birth  spacing of 18 months  4. Sleep and fatigue -Encouraged family/partner/community support of 4 hrs of uninterrupted sleep to help with mood and fatigue  5. Physical Recovery  - Discussed patients delivery and complications. She describes her labor as mixed. - Patient had a C-section emergent for NRFHR after Cytotec placement for cervical ripening.   - Patient has urinary incontinence? No. - Patient is safe to resume physical and sexual activity  6.  Health Maintenance - HM due items addressed Yes - Last pap smear  Diagnosis  Date Value Ref Range Status  01/13/2020   Final   - Negative for intraepithelial lesion or malignancy (NILM)   Pap smear not done at  today's visit.  -Breast Cancer screening indicated? No.   7. Chronic Disease/Pregnancy Condition follow up: None  - PCP follow up:  None  Baltazar Najjar, [place] for [facility], [place] [facility]  11/22/20

## 2020-11-23 LAB — CERVICOVAGINAL ANCILLARY ONLY
Bacterial Vaginitis (gardnerella): POSITIVE — AB
Candida Glabrata: NEGATIVE
Candida Vaginitis: POSITIVE — AB
Chlamydia: POSITIVE — AB
Comment: NEGATIVE
Comment: NEGATIVE
Comment: NEGATIVE
Comment: NEGATIVE
Comment: NEGATIVE
Comment: NORMAL
Neisseria Gonorrhea: NEGATIVE
Trichomonas: NEGATIVE

## 2020-11-26 ENCOUNTER — Other Ambulatory Visit: Payer: Self-pay | Admitting: Obstetrics

## 2020-11-26 ENCOUNTER — Other Ambulatory Visit: Payer: Self-pay

## 2020-11-26 DIAGNOSIS — B9689 Other specified bacterial agents as the cause of diseases classified elsewhere: Secondary | ICD-10-CM

## 2020-11-26 DIAGNOSIS — A749 Chlamydial infection, unspecified: Secondary | ICD-10-CM

## 2020-11-26 DIAGNOSIS — B379 Candidiasis, unspecified: Secondary | ICD-10-CM

## 2020-11-26 DIAGNOSIS — N760 Acute vaginitis: Secondary | ICD-10-CM

## 2020-11-26 MED ORDER — FLUCONAZOLE 150 MG PO TABS: 150.0000 mg | ORAL_TABLET | Freq: Once | ORAL | 0 refills | Status: AC

## 2020-11-26 MED ORDER — METRONIDAZOLE 500 MG PO TABS: 500.0000 mg | ORAL_TABLET | Freq: Two times a day (BID) | ORAL | 2 refills | Status: DC

## 2020-11-26 MED ORDER — AZITHROMYCIN 500 MG PO TABS: 1000.0000 mg | ORAL_TABLET | Freq: Once | ORAL | 0 refills | Status: AC

## 2020-11-26 MED ORDER — DOXYCYCLINE HYCLATE 100 MG PO CAPS
100.0000 mg | ORAL_CAPSULE | Freq: Two times a day (BID) | ORAL | 0 refills | Status: DC
Start: 2020-11-26 — End: 2020-12-04

## 2020-12-03 ENCOUNTER — Encounter (HOSPITAL_COMMUNITY): Payer: Self-pay

## 2020-12-03 ENCOUNTER — Emergency Department (HOSPITAL_COMMUNITY): Payer: BC Managed Care – PPO

## 2020-12-03 ENCOUNTER — Emergency Department (HOSPITAL_COMMUNITY)
Admission: EM | Admit: 2020-12-03 | Discharge: 2020-12-04 | Disposition: A | Discharge status: Home / Self Care | Payer: BC Managed Care – PPO | Attending: Emergency Medicine | Admitting: Emergency Medicine

## 2020-12-03 ENCOUNTER — Other Ambulatory Visit: Payer: Self-pay

## 2020-12-03 ENCOUNTER — Ambulatory Visit: Payer: BC Managed Care – PPO | Admitting: Obstetrics

## 2020-12-03 DIAGNOSIS — D259 Leiomyoma of uterus, unspecified: Secondary | ICD-10-CM | POA: Insufficient documentation

## 2020-12-03 DIAGNOSIS — N939 Abnormal uterine and vaginal bleeding, unspecified: Secondary | ICD-10-CM

## 2020-12-03 DIAGNOSIS — R102 Pelvic and perineal pain: Secondary | ICD-10-CM | POA: Insufficient documentation

## 2020-12-03 DIAGNOSIS — D219 Benign neoplasm of connective and other soft tissue, unspecified: Secondary | ICD-10-CM

## 2020-12-03 LAB — CBC WITH DIFFERENTIAL/PLATELET
Abs Immature Granulocytes: 0.01 10*3/uL (ref 0.00–0.07)
Basophils Absolute: 0 10*3/uL (ref 0.0–0.1)
Basophils Relative: 0 %
Eosinophils Absolute: 0.1 10*3/uL (ref 0.0–0.5)
Eosinophils Relative: 1 %
HCT: 35.8 % — ABNORMAL LOW (ref 36.0–46.0)
Hemoglobin: 12.6 g/dL (ref 12.0–15.0)
Immature Granulocytes: 0 %
Lymphocytes Relative: 23 %
Lymphs Abs: 1.6 10*3/uL (ref 0.7–4.0)
MCH: 32.4 pg (ref 26.0–34.0)
MCHC: 35.2 g/dL (ref 30.0–36.0)
MCV: 92 fL (ref 80.0–100.0)
Monocytes Absolute: 0.6 10*3/uL (ref 0.1–1.0)
Monocytes Relative: 8 %
Neutro Abs: 4.8 10*3/uL (ref 1.7–7.7)
Neutrophils Relative %: 68 %
Platelets: 267 10*3/uL (ref 150–400)
RBC: 3.89 MIL/uL (ref 3.87–5.11)
RDW: 12.2 % (ref 11.5–15.5)
WBC: 7 10*3/uL (ref 4.0–10.5)
nRBC: 0 % (ref 0.0–0.2)

## 2020-12-03 LAB — URINALYSIS, ROUTINE W REFLEX MICROSCOPIC
Bacteria, UA: NONE SEEN
Bilirubin Urine: NEGATIVE
Glucose, UA: NEGATIVE mg/dL
Ketones, ur: 5 mg/dL — AB
Leukocytes,Ua: NEGATIVE
Nitrite: NEGATIVE
Protein, ur: >=300 mg/dL — AB
Specific Gravity, Urine: 1.034 — ABNORMAL HIGH (ref 1.005–1.030)
pH: 6 (ref 5.0–8.0)

## 2020-12-03 LAB — BASIC METABOLIC PANEL
Anion gap: 8 (ref 5–15)
BUN: 9 mg/dL (ref 6–20)
CO2: 28 mmol/L (ref 22–32)
Calcium: 9.2 mg/dL (ref 8.9–10.3)
Chloride: 104 mmol/L (ref 98–111)
Creatinine, Ser: 0.95 mg/dL (ref 0.44–1.00)
GFR, Estimated: >60 mL/min (ref >60)
Glucose, Bld: 142 mg/dL — ABNORMAL HIGH (ref 70–99)
Potassium: 3.8 mmol/L (ref 3.5–5.1)
Sodium: 140 mmol/L (ref 135–145)

## 2020-12-03 LAB — WET PREP, GENITAL
Sperm: NONE SEEN
Trich, Wet Prep: NONE SEEN
Yeast Wet Prep HPF POC: NONE SEEN

## 2020-12-03 LAB — PREGNANCY, URINE: Preg Test, Ur: NEGATIVE

## 2020-12-03 NOTE — ED Provider Notes (Signed)
Emergency Medicine Provider Triage Evaluation Note  Kelly Lane , a 25 y.o. female  was evaluated in triage.  Pt complains of vaginal bleeding that started 2 weeks ago. She states she delivered a baby 2 months ago but did not have bleeding at that time. She was supposed to f/u with obgyn today but was not able to get off work.  Review of Systems  Positive: Vaginal bleeding Negative: fever  Physical Exam  BP 121/89 (BP Location: Left Arm)   Pulse 98   Temp 99.4 F (37.4 C) (Oral)   Resp 16   Ht '5\' 2"'$  (1.575 m)   Wt 63.5 kg   SpO2 100%   BMI 25.61 kg/m  Gen:   Awake, no distress   Resp:  Normal effort  MSK:   Moves extremities without difficulty   Medical Decision Making  Medically screening exam initiated at 8:03 PM.  Appropriate orders placed.  Melika I Worthen was informed that the remainder of the evaluation will be completed by another provider, this initial triage assessment does not replace that evaluation, and the importance of remaining in the ED until their evaluation is complete.     Bishop Dublin 12/03/20 2003    Lucrezia Starch, MD 12/04/20 2325

## 2020-12-03 NOTE — ED Provider Notes (Signed)
[facility] DEPT Provider Note   CSN: [id number] Arrival date & time: 12/03/20  1932     History Chief Complaint  Patient presents with   Vaginal Bleeding    Kelly Lane is a 25 y.o. female with a hx of anxiety, GERD, & endometriosis who presents to the ED with complaints of vaginal bleeding intermittently x 2 weeks. Patient reports vaginal bleeding has been variable in flow- spotting to bleeding and at times passing clots. Having associated bilateral pelvic cramping that has no alleviating/aggravating factors over the past 4-5 days with some increased bleeding. Was also recently treated for chlamydia with azithromycin following an exposure 11/26/20, took abx and a couple of days later her pelvic pain & worsened bleeding started, was not having discharge at that time and is not having any at present. She denies fever, N/V, dysuria, diarrhea, or syncope. Sexually active with one partner. Recent c-section delivery 2 months prior- had very mild spotting following.  She is concerned about PID.   HPI     Past Medical History:  Diagnosis Date   Anxiety    Endometriosis 07/2015   GERD (gastroesophageal reflux disease)    NO MEDS   Headache    Ovarian cyst 07/2015    Patient Active Problem List   Diagnosis Date Noted   S/P emergency cesarean section for NRFHT 09/20/2020   COVID-19 affecting pregnancy in first trimester 08/30/2020   Severe preeclampsia, delivered 08/29/2020   Rh negative state in antepartum period 06/07/2020   Supervision of high-risk pregnancy 05/24/2020    Past Surgical History:  Procedure Laterality Date   CESAREAN SECTION N/A 09/20/2020   Procedure: CESAREAN SECTION;  Surgeon: Osborne Oman, MD;  Location: [facility] ORS;  Service: Obstetrics;  Laterality: N/A;   LAPAROSCOPIC OVARIAN CYSTECTOMY Right 07/17/2015   Procedure: EXCISION OF ENDOMETRIOSIS;  Surgeon: Gae Dry, MD;  Location: [facility] ORS;  Service: Gynecology;   Laterality: Right;   LAPAROSCOPY N/A 07/17/2015   Procedure: LAPAROSCOPY OPERATIVE;  Surgeon: Gae Dry, MD;  Location: [facility] ORS;  Service: Gynecology;  Laterality: N/A;     OB History     Gravida  2   Para  1   Term  0   Preterm  1   AB  1   Living  1      SAB  0   IAB  1   Ectopic  0   Multiple  0   Live Births  1           Family History  Problem Relation Age of Onset   Hypertension Mother     Social History   Tobacco Use   Smoking status: Never   Smokeless tobacco: Never  Vaping Use   Vaping Use: Never used  Substance Use Topics   Alcohol use: Not Currently    Comment: every six months maybe 1 drink   Drug use: No    Home Medications Prior to Admission medications   Medication Sig Start Date End Date Taking? Authorizing Provider  doxycycline (VIBRAMYCIN) 100 MG capsule Take 1 capsule (100 mg total) by mouth 2 (two) times daily. 11/26/20   Shelly Bombard, MD  ibuprofen (ADVIL) 600 MG tablet Take 1 tablet (600 mg total) by mouth every 6 (six) hours. Patient not taking: Reported on 11/22/2020 09/23/20   Florian Buff, MD  metroNIDAZOLE (FLAGYL) 500 MG tablet Take 1 tablet (500 mg total) by mouth 2 (two) times daily. 11/26/20  Shelly Bombard, MD  norethindrone (MICRONOR) 0.35 MG tablet Take 1 tablet (0.35 mg total) by mouth daily. 10/18/20   Shelly Bombard, MD  prenatal vitamin w/FE, FA (PRENATAL 1 + 1) 27-1 MG TABS tablet Take 1 tablet by mouth daily before breakfast. 10/18/20   Shelly Bombard, MD    Allergies    Patient has no known allergies.  Review of Systems   Review of Systems  Constitutional:  Negative for chills and fever.  Respiratory:  Negative for cough and shortness of breath.   Cardiovascular:  Negative for chest pain.  Gastrointestinal:  Negative for nausea and vomiting.  Genitourinary:  Positive for pelvic pain and vaginal bleeding. Negative for dysuria and vaginal discharge.  Neurological:  Negative for syncope.   All other systems reviewed and are negative.  Physical Exam Updated Vital Signs BP 114/77   Pulse 85   Temp 99.4 F (37.4 C) (Oral)   Resp 17   Ht '5\' 2"'$  (1.575 m)   Wt 63.5 kg   SpO2 100%   BMI 25.61 kg/m   Physical Exam Vitals and nursing note reviewed.  Constitutional:      General: She is not in acute distress.    Appearance: She is well-developed. She is not toxic-appearing.  HENT:     Head: Normocephalic and atraumatic.  Eyes:     General:        Right eye: No discharge.        Left eye: No discharge.     Conjunctiva/sclera: Conjunctivae normal.  Cardiovascular:     Rate and Rhythm: Normal rate and regular rhythm.  Pulmonary:     Effort: Pulmonary effort is normal. No respiratory distress.     Breath sounds: Normal breath sounds. No wheezing, rhonchi or rales.  Abdominal:     General: There is no distension.     Palpations: Abdomen is soft.     Tenderness: There is no abdominal tenderness. There is no guarding or rebound.  Genitourinary:    Comments: NT present as chaperone. No external lesions. Small amount of blood present in the vaginal vault. No significant discharge. No adnexal or cervical motion tenderness.  Musculoskeletal:     Cervical back: Neck supple.  Skin:    General: Skin is warm and dry.     Findings: No rash.  Neurological:     Mental Status: She is alert.     Comments: Clear speech.   Psychiatric:        Behavior: Behavior normal.    ED Results / Procedures / Treatments   Labs (all labs ordered are listed, but only abnormal results are displayed) Labs Reviewed  WET PREP, GENITAL - Abnormal; Notable for the following components:      Result Value   Clue Cells Wet Prep HPF POC PRESENT (*)    WBC, Wet Prep HPF POC MODERATE (*)    All other components within normal limits  CBC WITH DIFFERENTIAL/PLATELET - Abnormal; Notable for the following components:   HCT 35.8 (*)    All other components within normal limits  BASIC METABOLIC PANEL -  Abnormal; Notable for the following components:   Glucose, Bld 142 (*)    All other components within normal limits  URINALYSIS, ROUTINE W REFLEX MICROSCOPIC - Abnormal; Notable for the following components:   Specific Gravity, Urine 1.034 (*)    Hgb urine dipstick SMALL (*)    Ketones, ur 5 (*)    Protein, ur >=300 (*)  All other components within normal limits  PREGNANCY, URINE  HIV ANTIBODY (ROUTINE TESTING W REFLEX)  RPR  GC/CHLAMYDIA PROBE AMP ([place]) NOT AT [facility]    EKG None  Radiology US Transvaginal Non-OB  Result Date: 12/03/2020 CLINICAL DATA:  Pelvic pain none. EXAM: TRANSABDOMINAL AND TRANSVAGINAL ULTRASOUND OF PELVIS DOPPLER ULTRASOUND OF OVARIES TECHNIQUE: Both transabdominal and transvaginal ultrasound examinations of the pelvis were performed. Transabdominal technique was performed for global imaging of the pelvis including uterus, ovaries, adnexal regions, and pelvic cul-de-sac. It was necessary to proceed with endovaginal exam following the transabdominal exam to visualize the uterus, endometrium and ovaries. Color and duplex Doppler ultrasound was utilized to evaluate blood flow to the ovaries. COMPARISON:  Abdomen pelvis CT 06/17/2019, pelvic ultrasound 08/18/2018 FINDINGS: Uterus Measurements: 7.8 x 4.8 x 5.7 cm = volume: 113 mL. The uterus is retroverted. There is a 1.4 x 1.0 x 1.1 cm hypoechoic subserosal fibroid in the right posterior uterine body/fundus. Endometrium Thickness: 8 mm. Trace fluid in the lower endocervical canal. No hyperemia. Right ovary Measurements: 4.2 x 2.1 x 2.4 cm = volume: 11 mL. Normal appearance with physiologic follicles. Normal blood flow. No cyst or adnexal mass. Left ovary Measurements: 3.7 x 2.3 x 2.7 cm = volume: 12 mL. Normal appearance of physiologic follicles and a physiologic follicular cyst measuring 15 mm. Normal blood flow. Pulsed Doppler evaluation of both ovaries demonstrates normal low-resistance arterial and venous  waveforms. Other findings Small volume pelvic free fluid. IMPRESSION: 1. Normal sonographic appearance of the ovaries with normal blood flow. No adnexal mass. 2. Retroverted uterus with small 1.4 cm fibroid. 3. Trace fluid in the endometrial canal is likely physiologic. No abnormal endometrial thickening. Electronically Signed   By: Keith Rake M.D.   On: 12/03/2020 23:51   US Pelvis Complete  Result Date: 12/03/2020 CLINICAL DATA:  Pelvic pain none. EXAM: TRANSABDOMINAL AND TRANSVAGINAL ULTRASOUND OF PELVIS DOPPLER ULTRASOUND OF OVARIES TECHNIQUE: Both transabdominal and transvaginal ultrasound examinations of the pelvis were performed. Transabdominal technique was performed for global imaging of the pelvis including uterus, ovaries, adnexal regions, and pelvic cul-de-sac. It was necessary to proceed with endovaginal exam following the transabdominal exam to visualize the uterus, endometrium and ovaries. Color and duplex Doppler ultrasound was utilized to evaluate blood flow to the ovaries. COMPARISON:  Abdomen pelvis CT 06/17/2019, pelvic ultrasound 08/18/2018 FINDINGS: Uterus Measurements: 7.8 x 4.8 x 5.7 cm = volume: 113 mL. The uterus is retroverted. There is a 1.4 x 1.0 x 1.1 cm hypoechoic subserosal fibroid in the right posterior uterine body/fundus. Endometrium Thickness: 8 mm. Trace fluid in the lower endocervical canal. No hyperemia. Right ovary Measurements: 4.2 x 2.1 x 2.4 cm = volume: 11 mL. Normal appearance with physiologic follicles. Normal blood flow. No cyst or adnexal mass. Left ovary Measurements: 3.7 x 2.3 x 2.7 cm = volume: 12 mL. Normal appearance of physiologic follicles and a physiologic follicular cyst measuring 15 mm. Normal blood flow. Pulsed Doppler evaluation of both ovaries demonstrates normal low-resistance arterial and venous waveforms. Other findings Small volume pelvic free fluid. IMPRESSION: 1. Normal sonographic appearance of the ovaries with normal blood flow. No adnexal  mass. 2. Retroverted uterus with small 1.4 cm fibroid. 3. Trace fluid in the endometrial canal is likely physiologic. No abnormal endometrial thickening. Electronically Signed   By: Keith Rake M.D.   On: 12/03/2020 23:51   [place] Art/Ven Flow Abd Pelv Doppler  Result Date: 12/03/2020 CLINICAL DATA:  Pelvic pain none. EXAM: TRANSABDOMINAL AND TRANSVAGINAL  ULTRASOUND OF PELVIS DOPPLER ULTRASOUND OF OVARIES TECHNIQUE: Both transabdominal and transvaginal ultrasound examinations of the pelvis were performed. Transabdominal technique was performed for global imaging of the pelvis including uterus, ovaries, adnexal regions, and pelvic cul-de-sac. It was necessary to proceed with endovaginal exam following the transabdominal exam to visualize the uterus, endometrium and ovaries. Color and duplex Doppler ultrasound was utilized to evaluate blood flow to the ovaries. COMPARISON:  Abdomen pelvis CT 06/17/2019, pelvic ultrasound 08/18/2018 FINDINGS: Uterus Measurements: 7.8 x 4.8 x 5.7 cm = volume: 113 mL. The uterus is retroverted. There is a 1.4 x 1.0 x 1.1 cm hypoechoic subserosal fibroid in the right posterior uterine body/fundus. Endometrium Thickness: 8 mm. Trace fluid in the lower endocervical canal. No hyperemia. Right ovary Measurements: 4.2 x 2.1 x 2.4 cm = volume: 11 mL. Normal appearance with physiologic follicles. Normal blood flow. No cyst or adnexal mass. Left ovary Measurements: 3.7 x 2.3 x 2.7 cm = volume: 12 mL. Normal appearance of physiologic follicles and a physiologic follicular cyst measuring 15 mm. Normal blood flow. Pulsed Doppler evaluation of both ovaries demonstrates normal low-resistance arterial and venous waveforms. Other findings Small volume pelvic free fluid. IMPRESSION: 1. Normal sonographic appearance of the ovaries with normal blood flow. No adnexal mass. 2. Retroverted uterus with small 1.4 cm fibroid. 3. Trace fluid in the endometrial canal is likely physiologic. No abnormal  endometrial thickening. Electronically Signed   By: Keith Rake M.D.   On: 12/03/2020 23:51    Procedures Procedures   Medications Ordered in ED Medications  cefTRIAXone (ROCEPHIN) injection 500 mg (has no administration in time range)    ED Course  I have reviewed the triage vital signs and the nursing notes.  Pertinent labs & imaging results that were available during my care of the patient were reviewed by me and considered in my medical decision making (see chart for details).    MDM Rules/Calculators/[name]                           Patient presents to the ED with complaints of vaginal bleeding.  Nontoxic, vitals WNL  Additional history obtained:  Additional history obtained from chart review & nursing note review.   Lab Tests:  I reviewed and interpreted labs, which included:  CBC: Unremarkable, no anemia or leukocytosis.  BMP: mild hyperglycemia UA: no UTI.  Wet prep: Clue cells w/ moderate leukocytes.  Preg test: Negative.   Imaging Studies ordered:  I ordered imaging studies which included pelvic US, I independently reviewed, formal radiology impression shows:  1. Normal sonographic appearance of the ovaries with normal blood flow. No adnexal mass. 2. Retroverted uterus with small 1.4 cm fibroid. 3. Trace fluid in the endometrial canal is likely physiologic. No abnormal endometrial thickening  ED Course:  Preg test negative- doubt ectopic or miscarriage.  [place] w/o torsion, TOA, or adnexal masses.  [place] w/ fibroid- may be contributory.  Possibly menstruation, however patient with recent chlamydia diagnosis with increasing pelvic pain therefore considering PID, lower suspicion without adnexal or cervical motion tenderness, patient currently breast feeding- shared decision making with the patient- will proceed w/ PID tx, will supplement with formula, obgyn follow up. I discussed results, treatment plan, need for follow-up, and return precautions with the patient.  Provided opportunity for questions, patient confirmed understanding and is in agreement with plan.   Portions of this note were generated with [employer]. Dictation errors may occur despite best attempts  at proofreading.  Final Clinical Impression(s) / ED Diagnoses Final diagnoses:  Vaginal bleeding  Fibroid    Rx / DC Orders ED Discharge Orders          Ordered    metroNIDAZOLE (FLAGYL) 500 MG tablet  2 times daily        12/04/20 0034    doxycycline (VIBRAMYCIN) 100 MG capsule  2 times daily        12/04/20 0034             Amaryllis Dyke, PA-C 12/04/20 0035    Lucrezia Starch, MD 12/08/20 2139

## 2020-12-03 NOTE — ED Triage Notes (Signed)
Pt states she has been bleeding on and off for about 3 weeks. Today pt report heavier bleeding. Pt reports going through 2 pads today. Pt reports being unable to go to the doctor due to work.   Pt concerned it could be PID. Reports cramping off and on.

## 2020-12-04 LAB — GC/CHLAMYDIA PROBE AMP (~~LOC~~) NOT AT ARMC
Chlamydia: NEGATIVE
Comment: NEGATIVE
Comment: NORMAL
Neisseria Gonorrhea: NEGATIVE

## 2020-12-04 LAB — RPR: RPR Ser Ql: NONREACTIVE

## 2020-12-04 LAB — HIV ANTIBODY (ROUTINE TESTING W REFLEX): HIV Screen 4th Generation wRfx: NONREACTIVE

## 2020-12-04 MED ORDER — DOXYCYCLINE HYCLATE 100 MG PO CAPS: 100.0000 mg | ORAL_CAPSULE | Freq: Two times a day (BID) | ORAL | 0 refills | Status: DC

## 2020-12-04 MED ORDER — STERILE WATER FOR INJECTION IJ SOLN
INTRAMUSCULAR | Status: AC
  Administered 2020-12-04: 2 mL
  Filled 2020-12-04: qty 10

## 2020-12-04 MED ORDER — METRONIDAZOLE 500 MG PO TABS: 500.0000 mg | ORAL_TABLET | Freq: Two times a day (BID) | ORAL | 0 refills | Status: DC

## 2020-12-04 MED ORDER — CEFTRIAXONE SODIUM 1 G IJ SOLR
500.0000 mg | Freq: Once | INTRAMUSCULAR | Status: AC
  Administered 2020-12-04: 500 mg via INTRAMUSCULAR
  Filled 2020-12-04: qty 10

## 2020-12-04 NOTE — ED Notes (Signed)
Patient was given education on RX, follow up and home care.

## 2020-12-04 NOTE — Discharge Instructions (Addendum)
You were seen in the ER today for vaginal bleeding & pelvic pain.  Your urine had some protein in it please have this rechecked by your primary care provider. Your ultrasound showed a fibroid but no other significant abnormalities.  We have tested you for gonorrhea, chlamydia, HIV, and syphilis, we will call you if any of these are positive, if positive you will need to inform sexual partners.  We are treating you for pelvic inflammatory disease with doxycycline and Flagyl.  Please take these as prescribed.  Do not drink alcohol with Flagyl as it can be very dangerous.  Do not breast-feed while taking these medications or for the 24 hours after completion.  We have prescribed you new medication(s) today. Discuss the medications prescribed today with your pharmacist as they can have adverse effects and interactions with your other medicines including over the counter and prescribed medications. Seek medical evaluation if you start to experience new or abnormal symptoms after taking one of these medicines, seek care immediately if you start to experience difficulty breathing, feeling of your throat closing, facial swelling, or rash as these could be indications of a more serious allergic reaction   Do not have intercourse of any kind until after 1 week following your last dose of antibiotics.  Please follow-up with OB/GYN-call tomorrow to schedule a follow-up appointment for soon as possible. I discussed results, treatment plan, need for follow-up, and return precautions with the patient. Provided opportunity for questions, patient confirmed understanding and is in agreement with plan.

## 2020-12-07 ENCOUNTER — Ambulatory Visit: Payer: Medicaid Other | Admitting: Family Medicine

## 2020-12-11 ENCOUNTER — Ambulatory Visit: Payer: BC Managed Care – PPO | Admitting: Obstetrics and Gynecology

## 2021-01-24 DIAGNOSIS — M62830 Muscle spasm of back: Secondary | ICD-10-CM | POA: Diagnosis not present

## 2021-01-24 DIAGNOSIS — M5415 Radiculopathy, thoracolumbar region: Secondary | ICD-10-CM | POA: Diagnosis not present

## 2021-01-24 DIAGNOSIS — M5414 Radiculopathy, thoracic region: Secondary | ICD-10-CM | POA: Diagnosis not present

## 2021-01-24 DIAGNOSIS — M9902 Segmental and somatic dysfunction of thoracic region: Secondary | ICD-10-CM | POA: Diagnosis not present

## 2021-02-07 DIAGNOSIS — U071 COVID-19: Secondary | ICD-10-CM | POA: Diagnosis not present

## 2021-03-01 ENCOUNTER — Telehealth: Payer: Self-pay

## 2021-03-01 NOTE — Telephone Encounter (Signed)
PA request for Nifedipine. Confirmed with Dr Elonda Husky that he wanted the pt to take it BID. PA request started and there is a 5 day turn around for a response.

## 2021-03-05 ENCOUNTER — Telehealth: Payer: Medicaid Other | Admitting: Physician Assistant

## 2021-03-05 DIAGNOSIS — R102 Pelvic and perineal pain: Secondary | ICD-10-CM

## 2021-03-05 NOTE — Progress Notes (Signed)
Based on what you shared with me, I feel your condition warrants further evaluation and I recommend that you be seen in a face to face visit with your gynecologist or at one of our [facility] clinics.  Unfortunately, we are unable to adequately treat vaginal pain alone as there are many causes for vaginal pain. You should be evaluated in person for this to make sure treatment is appropriate.   NOTE: There will be NO CHARGE for this eVisit   If you are having a true medical emergency please call 911.    *Center for [facility]             [address] [place], [place] [address] [phone] (*Take patients with no insurance)  *Center for [facility] at [place], [place],  [place]  [address] [phone] (*Take patients with no insurance)  [facility] for [facility] at [place], [place], [place], [place], [address] [phone]  [facility] [place], [place], [place], [place], [address] [phone]  Center for [facility] at [facility] [address], [place], [place], [place], [address] [phone]  Center for [facility] at [facility]                                 [place], [place], [place], [address] [phone]  Center for [facility] at [facility]                                    [address], [place], [place] [address] [place] [facility] [address] [place], [place], [place], [address]                              [facility] [place], [place], [place], [place], [address] [phone]  Your MyChart E-visit questionnaire answers were reviewed by a board certified advanced clinical practitioner to complete your personal care plan  based on your specific symptoms.  Thank you for using e-Visits.   I provided 6 minutes of non face-to-face time during this encounter for chart review and documentation.

## 2021-03-06 ENCOUNTER — Telehealth: Payer: Self-pay | Admitting: *Deleted

## 2021-03-06 NOTE — Telephone Encounter (Signed)
Office received fax that Nifedipine ER 60 mg has been approved from 03/06/21-03/06/22. [facility] on [address] aware. I tried to call pt but her voicemail was full. [place]

## 2021-03-19 ENCOUNTER — Telehealth: Payer: Self-pay

## 2021-03-19 NOTE — Telephone Encounter (Signed)
Breast pump order signed and faxed back to [facility]  Confirmation received.

## 2021-05-02 ENCOUNTER — Other Ambulatory Visit: Payer: Self-pay

## 2021-05-02 ENCOUNTER — Encounter (HOSPITAL_COMMUNITY): Payer: Self-pay | Admitting: Emergency Medicine

## 2021-05-02 ENCOUNTER — Ambulatory Visit (HOSPITAL_COMMUNITY)
Admission: EM | Admit: 2021-05-02 | Discharge: 2021-05-02 | Disposition: A | Discharge status: Home / Self Care | Payer: Medicaid Other | Attending: Internal Medicine | Admitting: Internal Medicine

## 2021-05-02 ENCOUNTER — Ambulatory Visit (INDEPENDENT_AMBULATORY_CARE_PROVIDER_SITE_OTHER): Payer: Medicaid Other

## 2021-05-02 DIAGNOSIS — R059 Cough, unspecified: Secondary | ICD-10-CM

## 2021-05-02 DIAGNOSIS — J069 Acute upper respiratory infection, unspecified: Secondary | ICD-10-CM | POA: Diagnosis not present

## 2021-05-02 DIAGNOSIS — R509 Fever, unspecified: Secondary | ICD-10-CM

## 2021-05-02 DIAGNOSIS — J0100 Acute maxillary sinusitis, unspecified: Secondary | ICD-10-CM | POA: Diagnosis present

## 2021-05-02 DIAGNOSIS — Z20822 Contact with and (suspected) exposure to covid-19: Secondary | ICD-10-CM | POA: Insufficient documentation

## 2021-05-02 LAB — POC INFLUENZA A AND B ANTIGEN (URGENT CARE ONLY)
INFLUENZA A ANTIGEN, POC: NEGATIVE
INFLUENZA B ANTIGEN, POC: NEGATIVE

## 2021-05-02 MED ORDER — BENZONATATE 200 MG PO CAPS: 200.0000 mg | ORAL_CAPSULE | Freq: Two times a day (BID) | ORAL | 0 refills | Status: DC | PRN

## 2021-05-02 MED ORDER — PSEUDOEPHEDRINE HCL ER 120 MG PO TB12: 120.0000 mg | ORAL_TABLET | Freq: Two times a day (BID) | ORAL | 0 refills | Status: DC

## 2021-05-02 MED ORDER — AMOXICILLIN-POT CLAVULANATE 875-125 MG PO TABS: 1.0000 | ORAL_TABLET | Freq: Two times a day (BID) | ORAL | 0 refills | Status: DC

## 2021-05-02 NOTE — ED Notes (Signed)
Patient given water and tissues.

## 2021-05-02 NOTE — Discharge Instructions (Signed)
Stay quarantined until the covid test is back.

## 2021-05-02 NOTE — ED Provider Notes (Signed)
[place]    CSN: [id number] Arrival date & time: 05/02/21  1823      History   Chief Complaint Chief Complaint  Patient presents with   Fever   Nasal Congestion   Headache   Chills    HPI Kelly Lane is a 25 y.o. female who started with scratchy throat and post nasal drainage 4 days ago, then mild aches and since today felt feverish and more achy. Nose mucous is  green. Productive cough is with green sputum as well. She denies being sick in the past month. The last URI was October and recovered fine.     Past Medical History:  Diagnosis Date   Anxiety    Endometriosis 07/2015   GERD (gastroesophageal reflux disease)    NO MEDS   Headache    Ovarian cyst 07/2015    Patient Active Problem List   Diagnosis Date Noted   S/P emergency cesarean section for NRFHT 09/20/2020   COVID-19 affecting pregnancy in first trimester 08/30/2020   Severe preeclampsia, delivered 08/29/2020   Rh negative state in antepartum period 06/07/2020   Supervision of high-risk pregnancy 05/24/2020    Past Surgical History:  Procedure Laterality Date   CESAREAN SECTION N/A 09/20/2020   Procedure: CESAREAN SECTION;  Surgeon: Osborne Oman, MD;  Location: [facility];  Service: Obstetrics;  Laterality: N/A;   LAPAROSCOPIC OVARIAN CYSTECTOMY Right 07/17/2015   Procedure: EXCISION OF ENDOMETRIOSIS;  Surgeon: Gae Dry, MD;  Location: [facility] ORS;  Service: Gynecology;  Laterality: Right;   LAPAROSCOPY N/A 07/17/2015   Procedure: LAPAROSCOPY OPERATIVE;  Surgeon: Gae Dry, MD;  Location: [facility] ORS;  Service: Gynecology;  Laterality: N/A;    OB History     Gravida  2   Para  1   Term  0   Preterm  1   AB  1   Living  1      SAB  0   IAB  1   Ectopic  0   Multiple  0   Live Births  1            Home Medications    Prior to Admission medications   Medication Sig Start Date End Date Taking? Authorizing Provider  amoxicillin-clavulanate  (AUGMENTIN) 875-125 MG tablet Take 1 tablet by mouth every 12 (twelve) hours. 05/02/21  Yes Rodriguez-Southworth, Sunday Spillers, PA-C  benzonatate (TESSALON) 200 MG capsule Take 1 capsule (200 mg total) by mouth 2 (two) times daily as needed for cough. 05/02/21  Yes Rodriguez-Southworth, Sunday Spillers, PA-C  pseudoephedrine (SUDAFED 12 HOUR) 120 MG 12 hr tablet Take 1 tablet (120 mg total) by mouth 2 (two) times daily. 05/02/21  Yes Rodriguez-Southworth, Sunday Spillers, PA-C    Family History Family History  Problem Relation Age of Onset   Hypertension Mother     Social History Social History   Tobacco Use   Smoking status: Never   Smokeless tobacco: Never  Vaping Use   Vaping Use: Never used  Substance Use Topics   Alcohol use: Not Currently    Comment: every six months maybe 1 drink   Drug use: No     Allergies   Patient has no known allergies.   Review of Systems Review of Systems  Constitutional:  Positive for activity change, appetite change, chills, fatigue and fever. Negative for diaphoresis.  HENT:  Positive for congestion, ear pain, postnasal drip and rhinorrhea. Negative for ear discharge, sore throat and trouble swallowing.   Eyes:  Negative for discharge.  Respiratory:  Positive for cough.   Gastrointestinal:  Negative for diarrhea, nausea and vomiting.  Skin:  Negative for rash.  Hematological:  Negative for adenopathy.    Physical Exam Triage Vital Signs ED Triage Vitals  Enc Vitals Group     BP 05/02/21 1935 136/88     Pulse Rate 05/02/21 1935 100     Resp 05/02/21 1935 18     Temp 05/02/21 1935 98.1 F (36.7 C)     Temp Source 05/02/21 1935 Oral     SpO2 05/02/21 1935 98 %     Weight --      Height --      Head Circumference --      [place]ak Flow --      Pain Score 05/02/21 1934 0     Pain Loc --      Pain Edu? --      Excl. in [place]? --    No data found.  Updated Vital Signs BP 136/88    Pulse 100    Temp 98.1 F (36.7 C) (Oral)    Resp 18    SpO2 98%     Breastfeeding Yes   Visual Acuity Right Eye Distance:   Left Eye Distance:   Bilateral Distance:    Right Eye Near:   Left Eye Near:    Bilateral Near:      Physical Exam Vitals signs and nursing note reviewed.  Constitutional:      General: She is not in acute distress.    Appearance: Normal appearance. She is not ill-appearing, toxic-appearing or diaphoretic.  HENT:     Head: Normocephalic.     Right Ear: Tympanic membrane, ear canal and external ear normal.     Left Ear: Tympanic membrane, ear canal and external ear normal.     Nose: moderate congestion. Has bilateral maxillary sinus tenderness and mild on ethmoids.     Mouth/Throat: clear    Mouth: Mucous membranes are moist.  Eyes:     General: No scleral icterus.       Right eye: No discharge.        Left eye: No discharge.     Conjunctiva/sclera: Conjunctivae normal.  Neck:     Musculoskeletal: Neck supple. No neck rigidity.  Cardiovascular:     Rate and Rhythm: Normal rate and regular rhythm.     Heart sounds: No murmur.  Pulmonary:     Effort: Pulmonary effort is normal.     Breath sounds: Normal breath sounds.  Abdominal:     General: Bowel sounds are normal. There is no distension.     Palpations: Abdomen is soft. There is no mass.     Tenderness: There is no abdominal tenderness. There is no guarding or rebound.     Hernia: No hernia is present.  Musculoskeletal: Normal range of motion.  Lymphadenopathy:     Cervical: No cervical adenopathy.  Skin:    General: Skin is warm and dry.     Coloration: Skin is not jaundiced.     Findings: No rash.  Neurological:     Mental Status: She is alert and oriented to person, place, and time.     Gait: Gait normal.  Psychiatric:        Mood and Affect: Mood normal.        Behavior: Behavior normal.        Thought Content: Thought content normal.        Judgment: Judgment  normal.    UC Treatments / Results  Labs (all labs ordered are listed, but only abnormal  results are displayed) Labs Reviewed  SARS CORONAVIRUS 2 (TAT 6-24 HRS)  POC INFLUENZA A AND B ANTIGEN (URGENT CARE ONLY)  Flu A&B negative  EKG   Radiology DG Chest 2 View  Result Date: 05/02/2021 CLINICAL DATA:  Fever and cough, chills, headache for 1 week EXAM: CHEST - 2 VIEW COMPARISON:  None. FINDINGS: Frontal and lateral views of the chest demonstrate an unremarkable cardiac silhouette. No airspace disease, effusion, or pneumothorax. No acute bony abnormalities. IMPRESSION: 1. No acute intrathoracic process. Electronically Signed   By: Randa Ngo M.D.   On: 05/02/2021 20:18    Procedures Procedures (including critical care time)  Medications Ordered in UC Medications - No data to display  Initial Impression / Assessment and Plan / UC Course  I have reviewed the triage vital signs and the nursing notes. Pertinent labs & imaging results that were available during my care of the patient were reviewed by me and considered in my medical decision making (see chart for details). URI Maxillary sinusitis Covid test is pending In the mean time I placed her on Augmentin, Sudafed and Tessalon as noted.    Final Clinical Impressions(s) / UC Diagnoses   Final diagnoses:  Viral URI with cough  Acute maxillary sinusitis, recurrence not specified     Discharge Instructions      Stay quarantined until the covid test is back.      ED Prescriptions     Medication Sig Dispense Auth. Provider   pseudoephedrine (SUDAFED 12 HOUR) 120 MG 12 hr tablet Take 1 tablet (120 mg total) by mouth 2 (two) times daily. 14 tablet Rodriguez-Southworth, Johsua Shevlin, PA-C   benzonatate (TESSALON) 200 MG capsule Take 1 capsule (200 mg total) by mouth 2 (two) times daily as needed for cough. 30 capsule Rodriguez-Southworth, Jazzlynn Rawe, PA-C   amoxicillin-clavulanate (AUGMENTIN) 875-125 MG tablet Take 1 tablet by mouth every 12 (twelve) hours. 14 tablet Rodriguez-Southworth, Sunday Spillers, PA-C      PDMP not  reviewed this encounter.   Shelby Mattocks, Hershal Coria 05/02/21 2049

## 2021-05-02 NOTE — ED Triage Notes (Signed)
Pt is present today with chills, fever, nasal congestion, and HA. Pt sx started last week

## 2021-05-03 LAB — SARS CORONAVIRUS 2 (TAT 6-24 HRS): SARS Coronavirus 2: NEGATIVE

## 2021-05-17 ENCOUNTER — Ambulatory Visit: Payer: Medicaid Other | Admitting: Physician Assistant

## 2021-06-05 ENCOUNTER — Encounter: Payer: Self-pay | Admitting: Obstetrics

## 2021-06-05 ENCOUNTER — Other Ambulatory Visit (HOSPITAL_COMMUNITY)
Admission: RE | Admit: 2021-06-05 | Discharge: 2021-06-05 | Disposition: A | Discharge status: Home / Self Care | Payer: Medicaid Other | Source: Ambulatory Visit | Attending: Obstetrics | Admitting: Obstetrics

## 2021-06-05 ENCOUNTER — Other Ambulatory Visit: Payer: Self-pay

## 2021-06-05 ENCOUNTER — Ambulatory Visit: Payer: Medicaid Other | Admitting: Obstetrics

## 2021-06-05 VITALS — BP 117/80 | HR 65 | Ht 62.0 in | Wt 135.3 lb

## 2021-06-05 DIAGNOSIS — N898 Other specified noninflammatory disorders of vagina: Secondary | ICD-10-CM | POA: Diagnosis present

## 2021-06-05 DIAGNOSIS — M25512 Pain in left shoulder: Secondary | ICD-10-CM

## 2021-06-05 DIAGNOSIS — M25511 Pain in right shoulder: Secondary | ICD-10-CM

## 2021-06-05 DIAGNOSIS — Z01419 Encounter for gynecological examination (general) (routine) without abnormal findings: Secondary | ICD-10-CM | POA: Insufficient documentation

## 2021-06-05 DIAGNOSIS — N62 Hypertrophy of breast: Secondary | ICD-10-CM

## 2021-06-05 DIAGNOSIS — G8929 Other chronic pain: Secondary | ICD-10-CM

## 2021-06-05 DIAGNOSIS — Z113 Encounter for screening for infections with a predominantly sexual mode of transmission: Secondary | ICD-10-CM

## 2021-06-05 DIAGNOSIS — M549 Dorsalgia, unspecified: Secondary | ICD-10-CM

## 2021-06-05 NOTE — Progress Notes (Signed)
Pt is in the office requesting a referral for breast reduction. Pt currently has nexplanon, placed at [facility] on 05-24-21. Last pap 01-13-20 Reports vaginal discharge, odor

## 2021-06-05 NOTE — Progress Notes (Signed)
Subjective:        Kelly Lane is a 26 y.o. female here for a routine exam.  Current complaints: Worsening upper back and shoulder pain from large and heavy breasts.  She has tried various sports bras without success.  Also c/o vaginal discharge.  Personal health questionnaire:  Is patient Ashkenazi Jewish, have a family history of breast and/or ovarian cancer: no Is there a family history of uterine cancer diagnosed at age < 56, gastrointestinal cancer, urinary tract cancer, family member who is a Field seismologist syndrome-associated carrier: no Is the patient overweight and hypertensive, family history of diabetes, personal history of gestational diabetes, preeclampsia or PCOS: no Is patient over 69, have PCOS,  family history of premature CHD under age 26, diabetes, smoke, have hypertension or peripheral artery disease:  no At any time, has a partner hit, kicked or otherwise hurt or frightened you?: no Over the past 2 weeks, have you felt down, depressed or hopeless?: no Over the past 2 weeks, have you felt little interest or pleasure in doing things?:no   Gynecologic History No LMP recorded. Patient has had an implant. Contraception: Nexplanon Last Pap: 01-13-2020. Results were: normal Last mammogram: n/a. Results were: n/a  Obstetric History OB History  Gravida Para Term Preterm AB Living  2 1 0 1 1 1   SAB IAB Ectopic Multiple Live Births  0 1 0 0 1    # Outcome Date GA Lbr Len/2nd Weight Sex Delivery Anes PTL Lv  2 Preterm 09/20/20 [redacted]w[redacted]d  3 lb 9.1 oz (1.62 kg) M CS-LTranv Spinal  LIV  1 IAB 01/19/18 [redacted]w[redacted]d           Past Medical History:  Diagnosis Date   Anxiety    Endometriosis 07/2015   GERD (gastroesophageal reflux disease)    NO MEDS   Headache    Ovarian cyst 07/2015    Past Surgical History:  Procedure Laterality Date   CESAREAN SECTION N/A 09/20/2020   Procedure: CESAREAN SECTION;  Surgeon: Osborne Oman, MD;  Location: [facility];  Service: Obstetrics;   Laterality: N/A;   LAPAROSCOPIC OVARIAN CYSTECTOMY Right 07/17/2015   Procedure: EXCISION OF ENDOMETRIOSIS;  Surgeon: Gae Dry, MD;  Location: [facility] ORS;  Service: Gynecology;  Laterality: Right;   LAPAROSCOPY N/A 07/17/2015   Procedure: LAPAROSCOPY OPERATIVE;  Surgeon: Gae Dry, MD;  Location: [facility] ORS;  Service: Gynecology;  Laterality: N/A;     Current Outpatient Medications:    amoxicillin-clavulanate (AUGMENTIN) 875-125 MG tablet, Take 1 tablet by mouth every 12 (twelve) hours. (Patient not taking: Reported on 06/05/2021), Disp: 14 tablet, Rfl: 0   benzonatate (TESSALON) 200 MG capsule, Take 1 capsule (200 mg total) by mouth 2 (two) times daily as needed for cough. (Patient not taking: Reported on 06/05/2021), Disp: 30 capsule, Rfl: 0   pseudoephedrine (SUDAFED 12 HOUR) 120 MG 12 hr tablet, Take 1 tablet (120 mg total) by mouth 2 (two) times daily. (Patient not taking: Reported on 06/05/2021), Disp: 14 tablet, Rfl: 0 No Known Allergies  Social History   Tobacco Use   Smoking status: Never   Smokeless tobacco: Never  Substance Use Topics   Alcohol use: Not Currently    Comment: every six months maybe 1 drink    Family History  Problem Relation Age of Onset   Hypertension Mother       Review of Systems  Constitutional: negative for fatigue and weight loss Respiratory: negative for cough and wheezing Cardiovascular: negative for  chest pain, fatigue and palpitations Gastrointestinal: negative for abdominal pain and change in bowel habits Musculoskeletal:positive for myalgias - back and shoulder pain Neurological: negative for gait problems and tremors Behavioral/Psych: negative for abusive relationship, depression Endocrine: negative for temperature intolerance    Genitourinary: positive for vaginal discharge.  negative for abnormal menstrual periods, genital lesions, hot flashes, sexual problems  Integument/breast: negative for breast lump, breast tenderness, nipple  discharge and skin lesion(s)    Objective:       BP 117/80    Pulse 65    Ht 5\' 2"  (1.575 m)    Wt 135 lb 4.8 oz (61.4 kg)    BMI 24.75 kg/m  General:   Alert and no distress  Skin:   no rash or abnormalities  Lungs:   clear to auscultation bilaterally  Heart:   regular rate and rhythm, S1, S2 normal, no murmur, click, rub or gallop  Breasts:   normal without suspicious masses, skin or nipple changes or axillary nodes  Abdomen:  normal findings: no organomegaly, soft, non-tender and no hernia  Pelvis:  External genitalia: normal general appearance Urinary system: urethral meatus normal and bladder without fullness, nontender Vaginal: normal without tenderness, induration or masses Cervix: normal appearance Adnexa: normal bimanual exam Uterus: anteverted and non-tender, normal size   Lab Review Urine pregnancy test Labs reviewed yes Radiologic studies reviewed yes  I have spent a total of 20 minutes of face-to-face time, excluding clinical staff time, reviewing notes and preparing to see patient, ordering tests and/or medications, and counseling the patient.   Assessment:    1. Encounter for gynecological examination with Papanicolaou smear of cervix Rx: - Cytology - PAP( [facility])  2. Vaginal discharge Rx: - Cervicovaginal ancillary only( [place])  3. Screening for STD (sexually transmitted disease) Rx: - HIV Antibody (routine testing w rflx) - Hepatitis B surface antigen - RPR - Hepatitis C antibody  4. Large breasts - chronic shoulder and back pain, worse over the past year - currently breast feeding.   - will refer to Plastic Surgery after breast feeding is completed  5. Other chronic back pain  6. Chronic pain of both shoulders     Plan:    Education reviewed: calcium supplements, depression evaluation, low fat, low cholesterol diet, safe sex/STD prevention, self breast exams, and weight bearing exercise. Contraception: Nexplanon. Follow up in: 1  year.    Orders Placed This Encounter  Procedures   HIV Antibody (routine testing w rflx)   Hepatitis B surface antigen   RPR   Hepatitis C antibody      Shelly Bombard, MD 06/05/2021 4:38 PM

## 2021-06-06 LAB — HEPATITIS B SURFACE ANTIGEN: Hepatitis B Surface Ag: NEGATIVE

## 2021-06-06 LAB — HIV ANTIBODY (ROUTINE TESTING W REFLEX): HIV Screen 4th Generation wRfx: NONREACTIVE

## 2021-06-06 LAB — HEPATITIS C ANTIBODY: Hep C Virus Ab: <0.1 s/co ratio (ref 0.0–0.9)

## 2021-06-06 LAB — RPR: RPR Ser Ql: NONREACTIVE

## 2021-06-07 LAB — CERVICOVAGINAL ANCILLARY ONLY
Bacterial Vaginitis (gardnerella): NEGATIVE
Candida Glabrata: NEGATIVE
Candida Vaginitis: POSITIVE — AB
Chlamydia: NEGATIVE
Comment: NEGATIVE
Comment: NEGATIVE
Comment: NEGATIVE
Comment: NEGATIVE
Comment: NEGATIVE
Comment: NORMAL
Neisseria Gonorrhea: NEGATIVE
Trichomonas: NEGATIVE

## 2021-06-10 ENCOUNTER — Other Ambulatory Visit: Payer: Self-pay | Admitting: Obstetrics

## 2021-06-10 DIAGNOSIS — B379 Candidiasis, unspecified: Secondary | ICD-10-CM

## 2021-06-10 LAB — CYTOLOGY - PAP
Diagnosis: NEGATIVE
Diagnosis: REACTIVE

## 2021-06-10 MED ORDER — FLUCONAZOLE 150 MG PO TABS: 150.0000 mg | ORAL_TABLET | Freq: Once | ORAL | 0 refills | Status: AC

## 2021-06-11 ENCOUNTER — Encounter: Payer: Self-pay | Admitting: *Deleted

## 2021-06-11 ENCOUNTER — Ambulatory Visit: Payer: Medicaid Other | Admitting: Obstetrics & Gynecology

## 2021-06-11 NOTE — Progress Notes (Signed)
TC to inform pt of results and RX. No answer. No VM available. MyChart message sent including information on results, RX and education on vaginal yeast infections.

## 2021-06-14 ENCOUNTER — Other Ambulatory Visit: Payer: Self-pay | Admitting: *Deleted

## 2021-06-14 DIAGNOSIS — Z30011 Encounter for initial prescription of contraceptive pills: Secondary | ICD-10-CM

## 2021-06-15 ENCOUNTER — Telehealth: Payer: Medicaid Other | Admitting: Nurse Practitioner

## 2021-06-15 DIAGNOSIS — N939 Abnormal uterine and vaginal bleeding, unspecified: Secondary | ICD-10-CM | POA: Diagnosis not present

## 2021-06-15 MED ORDER — IBUPROFEN 600 MG PO TABS: 600.0000 mg | ORAL_TABLET | Freq: Three times a day (TID) | ORAL | 0 refills | Status: DC | PRN

## 2021-06-15 NOTE — Progress Notes (Signed)
Virtual Visit Consent   [name], you are scheduled for a virtual visit with a [facility] provider today.     Just as with appointments in the office, your consent must be obtained to participate.  Your consent will be active for this visit and any virtual visit you may have with one of our providers in the next 365 days.     If you have a MyChart account, a copy of this consent can be sent to you electronically.  All virtual visits are billed to your insurance company just like a traditional visit in the office.    As this is a virtual visit, video technology does not allow for your provider to perform a traditional examination.  This may limit your provider's ability to fully assess your condition.  If your provider identifies any concerns that need to be evaluated in person or the need to arrange testing (such as labs, EKG, etc.), we will make arrangements to do so.     Although advances in technology are sophisticated, we cannot ensure that it will always work on either your end or our end.  If the connection with a video visit is poor, the visit may have to be switched to a telephone visit.  With either a video or telephone visit, we are not always able to ensure that we have a secure connection.     I need to obtain your verbal consent now.   Are you willing to proceed with your visit today?    [name] has provided verbal consent on 06/15/2021 for a virtual visit (video or telephone).   [name], FNP   Date: 06/15/2021 1:11 PM   Virtual Visit via Video Note   I, [name], connected with  Kelly Lane  ([id number], Apr 08, 1996) on 06/15/21 at  1:15 PM EST by a video-enabled telemedicine application and verified that I am speaking with the correct person using two identifiers.  Location: Patient: Virtual Visit Location Patient: Home Provider: Virtual Visit Location Provider: Home Office   I discussed the limitations of evaluation and management by telemedicine and  the availability of in person appointments. The patient expressed understanding and agreed to proceed.    History of Present Illness: Kelly Lane is a 26 y.o. who identifies as a female who was assigned female at birth, and is being seen today with questions regarding her birth control. She has a Nexplanon that was placed 05/24/21. She has been experiencing breakthrough bleeding since having the Nexplanon placed. She had the Nexplanon [facility] at [facility].   This happened to her in the past and she had to use an oral OCP to stop the breakthrough bleeding.   She is not breastfeeding currently.   84 month old baby   Problems:  Patient Active Problem List   Diagnosis Date Noted   S/P emergency cesarean section for NRFHT 09/20/2020   COVID-19 affecting pregnancy in first trimester 08/30/2020   Severe preeclampsia, delivered 08/29/2020   Rh negative state in antepartum period 06/07/2020   Supervision of high-risk pregnancy 05/24/2020    Allergies: No Known Allergies Medications:  Current Outpatient Medications:    amoxicillin-clavulanate (AUGMENTIN) 875-125 MG tablet, Take 1 tablet by mouth every 12 (twelve) hours. (Patient not taking: Reported on 06/05/2021), Disp: 14 tablet, Rfl: 0   benzonatate (TESSALON) 200 MG capsule, Take 1 capsule (200 mg total) by mouth 2 (two) times daily as needed for cough. (Patient not taking: Reported on 06/05/2021), Disp:  30 capsule, Rfl: 0   pseudoephedrine (SUDAFED 12 HOUR) 120 MG 12 hr tablet, Take 1 tablet (120 mg total) by mouth 2 (two) times daily. (Patient not taking: Reported on 06/05/2021), Disp: 14 tablet, Rfl: 0  Observations/Objective: Patient is well-developed, well-nourished in no acute distress.  Resting comfortably at home.  Head is normocephalic, atraumatic.  No labored breathing.  Speech is clear and coherent with logical content.  Patient is alert and oriented at baseline.    Assessment and Plan: 1. Menstrual bleeding problem  -  ibuprofen (ADVIL) 600 MG tablet; Take 1 tablet (600 mg total) by mouth every 8 (eight) hours as needed for cramping. Take with food  Dispense: 30 tablet; Refill: 0    Instructed to [facility] other [facility]. Since she is unable to see them over the weekend use ibuprofen to help manage symptoms and seek follow up at [facility] with any acutely worsening symptoms   Follow Up Instructions: I discussed the assessment and treatment plan with the patient. The patient was provided an opportunity to ask questions and all were answered. The patient agreed with the plan and demonstrated an understanding of the instructions.  A copy of instructions were sent to the patient via MyChart unless otherwise noted below.   The patient was advised to call back or seek an in-person evaluation if the symptoms worsen or if the condition fails to improve as anticipated.  Time:  I spent 15 minutes with the patient via telehealth technology discussing the above problems/concerns.    [name], FNP

## 2021-06-17 ENCOUNTER — Other Ambulatory Visit: Payer: Self-pay

## 2021-06-17 ENCOUNTER — Emergency Department (HOSPITAL_COMMUNITY)
Admission: EM | Admit: 2021-06-17 | Discharge: 2021-06-18 | Disposition: A | Discharge status: Home / Self Care | Payer: Medicaid Other | Attending: Emergency Medicine | Admitting: Emergency Medicine

## 2021-06-17 ENCOUNTER — Ambulatory Visit: Payer: Self-pay | Admitting: *Deleted

## 2021-06-17 ENCOUNTER — Encounter (HOSPITAL_COMMUNITY): Payer: Self-pay

## 2021-06-17 ENCOUNTER — Other Ambulatory Visit: Payer: Self-pay | Admitting: *Deleted

## 2021-06-17 DIAGNOSIS — T50904A Poisoning by unspecified drugs, medicaments and biological substances, undetermined, initial encounter: Secondary | ICD-10-CM | POA: Diagnosis present

## 2021-06-17 DIAGNOSIS — N9489 Other specified conditions associated with female genital organs and menstrual cycle: Secondary | ICD-10-CM | POA: Insufficient documentation

## 2021-06-17 DIAGNOSIS — T6594XA Toxic effect of unspecified substance, undetermined, initial encounter: Secondary | ICD-10-CM

## 2021-06-17 DIAGNOSIS — T40714A Poisoning by cannabis, undetermined, initial encounter: Secondary | ICD-10-CM | POA: Insufficient documentation

## 2021-06-17 DIAGNOSIS — X58XXXA Exposure to other specified factors, initial encounter: Secondary | ICD-10-CM | POA: Insufficient documentation

## 2021-06-17 LAB — CBC WITH DIFFERENTIAL/PLATELET
Abs Immature Granulocytes: 0.03 10*3/uL (ref 0.00–0.07)
Basophils Absolute: 0 10*3/uL (ref 0.0–0.1)
Basophils Relative: 1 %
Eosinophils Absolute: 0 10*3/uL (ref 0.0–0.5)
Eosinophils Relative: 1 %
HCT: 38.8 % (ref 36.0–46.0)
Hemoglobin: 13.8 g/dL (ref 12.0–15.0)
Immature Granulocytes: 0 %
Lymphocytes Relative: 34 %
Lymphs Abs: 2.6 10*3/uL (ref 0.7–4.0)
MCH: 32.9 pg (ref 26.0–34.0)
MCHC: 35.6 g/dL (ref 30.0–36.0)
MCV: 92.4 fL (ref 80.0–100.0)
Monocytes Absolute: 0.5 10*3/uL (ref 0.1–1.0)
Monocytes Relative: 6 %
Neutro Abs: 4.4 10*3/uL (ref 1.7–7.7)
Neutrophils Relative %: 58 %
Platelets: 312 10*3/uL (ref 150–400)
RBC: 4.2 MIL/uL (ref 3.87–5.11)
RDW: 12.4 % (ref 11.5–15.5)
WBC: 7.6 10*3/uL (ref 4.0–10.5)
nRBC: 0 % (ref 0.0–0.2)

## 2021-06-17 LAB — BASIC METABOLIC PANEL
Anion gap: 7 (ref 5–15)
BUN: 17 mg/dL (ref 6–20)
CO2: 22 mmol/L (ref 22–32)
Calcium: 8.7 mg/dL — ABNORMAL LOW (ref 8.9–10.3)
Chloride: 107 mmol/L (ref 98–111)
Creatinine, Ser: 0.75 mg/dL (ref 0.44–1.00)
GFR, Estimated: >60 mL/min (ref >60)
Glucose, Bld: 204 mg/dL — ABNORMAL HIGH (ref 70–99)
Potassium: 2.9 mmol/L — ABNORMAL LOW (ref 3.5–5.1)
Sodium: 136 mmol/L (ref 135–145)

## 2021-06-17 MED ORDER — SODIUM CHLORIDE 0.9 % IV BOLUS
1000.0000 mL | Freq: Once | INTRAVENOUS | Status: AC
  Administered 2021-06-18: 1000 mL via INTRAVENOUS

## 2021-06-17 MED ORDER — POTASSIUM CHLORIDE CRYS ER 20 MEQ PO TBCR: 40.0000 meq | EXTENDED_RELEASE_TABLET | Freq: Once | ORAL | Status: DC

## 2021-06-17 NOTE — Telephone Encounter (Signed)
Summary: spotting and medication   Pt has spotting and she asked for an RX for it (Norethindrone) / Pt states spotting is coming from birth control / please advise      Reason for Disposition  [1] Caller has NON-URGENT question AND [2] triager unable to answer question  Answer Assessment - Initial Assessment Questions 1. IMPLANT TYPE: "What type of implant are you using?"  (e.g., Jadelle, Nexplanon, Norplant)      Nexplanon 2. IMPLANT START DATE: "When did you first start using the implant?" (e.g., date; weeks, months, years ago)      05/24/21 3. IMPLANT LOCATION: "Where is implant located?" (e.g., abdomen, arm; left or right)     na 4. SYMPTOM: "What is the main symptom (or question) you're concerned about?"     spotting 5. ONSET: "When did the bleeding start?"     Last week 6. VAGINAL BLEEDING: "Are you having any unusual vaginal bleeding?"   - NONE: no bleeding   - SPOTTING: spotting, or pinkish / brownish mucous discharge; does not fill panty liner or pad    - MILD:  less than 1 pad / hour; less than patient's usual menstrual bleeding   - MODERATE: 1-2 pads / hour; 1 menstrual cup every 6 hours; small-medium blood clots (e.g., pea, grape, small coin)   - SEVERE: soaking 2 or more pads/hour for 2 or more hours; 1 menstrual cup every 2 hours; bleeding not contained by pads or continuous red blood from vagina; large blood clots (e.g., golf ball, large coin)      mild 7. ABDOMEN OR PELVIC PAIN: "Are you having any pain in your abdomen or pelvic area?" (Scale: 0, 1-10; none, mild, moderate, severe)   - NONE (0): no pain   - MILD (1-3): doesn't interfere with normal activities, abdomen soft and not tender to touch    - MODERATE (4-7): interferes with normal activities or awakens from sleep, abdomen tender to touch    - SEVERE (8-10): excruciating pain, doubled over, unable to do any normal activities      0 8. PREGNANCY: "Are you concerned you might be pregnant?" "When was your last  menstrual  period?"     na  Protocols used: Contraception - Implant Symptoms and Questions-A-AH

## 2021-06-17 NOTE — ED Triage Notes (Addendum)
Pt BIB EMS. Pt reports taking half of a delta 8 gummy 1 hr ago. Boyfriend states that pt was "spazzing out".

## 2021-06-17 NOTE — ED Provider Triage Note (Signed)
Emergency Medicine Provider Triage Evaluation Note  Kelly Lane , a 26 y.o. female  was evaluated in triage.  Pt complains of ingesting delta 8 Gummies.  Boyfriend then called EMS.  Patient states she has sense of impending doom.  No chest pain, shortness of breath, abdominal pain, nausea, vomiting, diarrhea.  Review of Systems  Positive:  Negative: See above   Physical Exam  There were no vitals taken for this visit. Gen:   Awake, no distress   Resp:  Normal effort  MSK:   Moves extremities without difficulty  Other:    Medical Decision Making  Medically screening exam initiated at 10:52 PM.  Appropriate orders placed.  Kelly Lane was informed that the remainder of the evaluation will be completed by another provider, this initial triage assessment does not replace that evaluation, and the importance of remaining in the ED until their evaluation is complete.     Kelly Lane, [place] 06/17/21 2253

## 2021-06-17 NOTE — ED Provider Notes (Signed)
[facility] DEPT Provider Note   CSN: [id number] Arrival date & time: 06/17/21  2220     History  Chief Complaint  Patient presents with   Ingestion    Kelly Lane is a 26 y.o. female.  HPI  26 year old female with history of endometriosis, ovarian cyst, anxiety, GERD, headache who presents to the emergency department today for evaluation after an ingestion.  Patient took some weed Gummies earlier today and states she feels woozy and dizzy now.  She does not know how many she took.  She denies any other coingestions except for taking some ibuprofen.  She denies any other symptoms at this time.  Home Medications Prior to Admission medications   Medication Sig Start Date End Date Taking? Authorizing Provider  amoxicillin-clavulanate (AUGMENTIN) 875-125 MG tablet Take 1 tablet by mouth every 12 (twelve) hours. Patient not taking: Reported on 06/05/2021 05/02/21   Rodriguez-Southworth, Sunday Spillers, PA-C  benzonatate (TESSALON) 200 MG capsule Take 1 capsule (200 mg total) by mouth 2 (two) times daily as needed for cough. Patient not taking: Reported on 06/05/2021 05/02/21   Rodriguez-Southworth, Sunday Spillers, PA-C  ibuprofen (ADVIL) 600 MG tablet Take 1 tablet (600 mg total) by mouth every 8 (eight) hours as needed for cramping. Take with food 06/15/21   [name], FNP  norethindrone (AYGESTIN) 5 MG tablet Take 5 mg by mouth daily. 12/07/20   [provider]  norethindrone (MICRONOR) 0.35 MG tablet Take 1 tablet by mouth daily. 06/14/21   [provider]  pseudoephedrine (SUDAFED 12 HOUR) 120 MG 12 hr tablet Take 1 tablet (120 mg total) by mouth 2 (two) times daily. Patient not taking: Reported on 06/05/2021 05/02/21   Rodriguez-Southworth, Sunday Spillers, PA-C      Allergies    Patient has no known allergies.    Review of Systems   Review of Systems See HPI for pertinent positives or negatives.   Physical Exam Updated Vital Signs BP 110/77    Pulse 70     Temp 98 F (36.7 C) (Oral)    Resp 16    SpO2 99%  Physical Exam Vitals and nursing note reviewed.  Constitutional:      General: She is not in acute distress.    Appearance: She is well-developed.  HENT:     Head: Normocephalic and atraumatic.  Eyes:     Conjunctiva/sclera: Conjunctivae normal.  Cardiovascular:     Rate and Rhythm: Regular rhythm. Tachycardia present.  Pulmonary:     Effort: Pulmonary effort is normal.     Breath sounds: Normal breath sounds.  Abdominal:     Palpations: Abdomen is soft.     Tenderness: There is no abdominal tenderness.  Musculoskeletal:        General: Normal range of motion.     Cervical back: Neck supple.  Skin:    General: Skin is warm and dry.  Neurological:     Mental Status: She is alert.     Comments: Mental Status:  Somnolent but arousable to voice, thought content appropriate, able to follow commands Cranial Nerves:  II:  pupils equal, round, reactive to light III,IV, VI: ptosis not present, extra-ocular motions intact bilaterally  V,VII: smile symmetric, facial light touch sensation equal VIII: hearing grossly normal to voice  X: uvula elevates symmetrically  XI: bilateral shoulder shrug symmetric and strong XII: midline tongue extension without fassiculations Motor:  Normal tone. 5/5 strength of BUE and BLE major muscle groups including strong and equal grip strength and  dorsiflexion/plantar flexion      ED Results / Procedures / Treatments   Labs (all labs ordered are listed, but only abnormal results are displayed) Labs Reviewed  BASIC METABOLIC PANEL - Abnormal; Notable for the following components:      Result Value   Potassium 2.9 (*)    Glucose, Bld 204 (*)    Calcium 8.7 (*)    All other components within normal limits  CBC WITH DIFFERENTIAL/PLATELET  HCG, SERUM, QUALITATIVE    EKG None  Radiology No results found.  Procedures Procedures    Medications Ordered in ED Medications  potassium  chloride SA (KLOR-CON M) CR tablet 40 mEq (has no administration in time range)  sodium chloride 0.9 % bolus 1,000 mL (1,000 mLs Intravenous New Bag/Given 06/18/21 0020)    ED Course/ Medical Decision Making/ [name]                           Medical Decision Making Amount and/or Complexity of Data Reviewed Labs: ordered.  Risk Prescription drug management.   This patient presents to the ED for concern of ingestion/ams, this involves an extensive number of treatment options, and is a complaint that carries with it a high risk of complications and morbidity.  The differential diagnosis includes but is not limited to ich, cva, meningitis, overdose, metabolic derangement   Additional history obtained: Records reviewed Primary Care Documents  Lab Tests: I Ordered, and personally interpreted labs.  The pertinent results include:   CBC unremarkable BMP with mild hypokalemia, elevated blood glucose, otherwise reassuring Preg test negative  Medicines ordered and prescription drug management: I ordered medication including ivf  for ams  Reevaluation of the patient after these medicines showed that the patient    improved  Test Considered: Do not feel that ct head is emergently indicated due to likely source of ams is secondary to drug ingestion  Complexity of problems addressed: Patients presentation is most consistent with  acute complicated illness/injury requiring diagnostic workup  Disposition: After consideration of the diagnostic results and the patients response to treatment,  I feel that the patent would benefit from discharge home. Pt monitored in the department for about 8 hours.  She was reassessed multiple times and on final reassessment patient is more awake and is oriented.  She is ambulatory in the department.  I suspect her symptoms are secondary to her drug ingestion prior to arrival.  Her neurologic exam shows no deficits.  I doubt emergent intracranial process that would  require further work-up or admission at this time.  Feel she is appropriate for discharge home.  All questions answered, patient stable for discharge. .    Final Clinical Impression(s) / ED Diagnoses Final diagnoses:  Ingestion of substance, undetermined intent, initial encounter    Rx / DC Orders ED Discharge Orders     None         Rodney Booze, PA-C 06/18/21 7741    Daleen Bo, MD 06/18/21 1230

## 2021-06-17 NOTE — Telephone Encounter (Signed)
Summary: spotting and medication   Pt has spotting and she asked for an RX for it (Norethindrone) / Pt states spotting is coming from birth control / please advise       Called patient to review sx of vaginal spotting. No answer, request to enter mailbox #. Unable to leave message.

## 2021-06-17 NOTE — Telephone Encounter (Signed)
°  Chief Complaint: vaginal spotting /bleeding Symptoms: bleeding Frequency:  1 week Pertinent Negatives: Patient denies fever, heavy bleeding Disposition: [] ED /[] Urgent Care (no appt availability in office) / [] Appointment(In office/virtual)/ []  [facility]/ [] Home Care/ [] Refused Recommended Disposition /[] [facility]/ [x]  Follow-up with PCP Additional Notes:  Pt returned our call.  Pt had Nexplanon implanted by PP in January and is now experiencing bleeding/spotting. Pt has call OB  - no appointments available. Pt had virtual appointment  - she was told to try Advil for discomfort and referred her back to [facility] or OB.  Pt has not spoken to anyone at [facility], she believes that they are several months out for appointments.  She also has a phone appointment for weds 06/19/2021. Pt went to [facility] where they did not see her.  Advised pt to call PP and also keep telephone appointment for weds.  Pt really wants a quicker solution to her problem.

## 2021-06-18 ENCOUNTER — Telehealth: Payer: Self-pay | Admitting: Emergency Medicine

## 2021-06-18 LAB — HCG, SERUM, QUALITATIVE: Preg, Serum: NEGATIVE

## 2021-06-18 NOTE — Telephone Encounter (Signed)
Patient called and left message on triage vm complaining of continued bleeding with ocp.   Attempted to return call to patient. No answer, unable to leave vm.

## 2021-06-18 NOTE — ED Notes (Signed)
Pt. Ambulated to the bathroom and back without difficulty. Pt. Gait steady on her feet. PA made aware.

## 2021-06-18 NOTE — Discharge Instructions (Addendum)
Please follow up with your primary care provider within 5-7 days for re-evaluation of your symptoms. If you do not have a primary care provider, information for a healthcare clinic has been provided for you to make arrangements for follow up care. Please return to the emergency department for any new or worsening symptoms. ° °

## 2021-06-19 ENCOUNTER — Ambulatory Visit (INDEPENDENT_AMBULATORY_CARE_PROVIDER_SITE_OTHER): Payer: Medicaid Other | Admitting: Obstetrics

## 2021-06-19 ENCOUNTER — Encounter: Payer: Self-pay | Admitting: Obstetrics

## 2021-06-19 DIAGNOSIS — Z3046 Encounter for surveillance of implantable subdermal contraceptive: Secondary | ICD-10-CM

## 2021-06-19 NOTE — Progress Notes (Signed)
TELEHEALTH GYNECOLOGY VISIT ENCOUNTER NOTE  Provider location: [facility]   Patient location: Home  I connected with Kelly Lane on 06/19/21 at  3:30 PM EST by telephone and verified that I am speaking with the correct person using two identifiers. Patient was unable to do MyChart audiovisual encounter due to technical difficulties, she tried several times.    I discussed the limitations, risks, security and privacy concerns of performing an evaluation and management service by telephone and the availability of in person appointments.  also discussed with the patient that there may be a patient responsible charge related to this service. The patient expressed understanding and agreed to proceed.   History:  Kelly Lane is a 26 y.o. [phone] female being evaluated today for irregular spotting after Nexplanon insertion.  Nexplanon was inserted in January 2023I. She denies any abnormal vaginal discharge, bleeding, pelvic pain or other concerns.       Past Medical History:  Diagnosis Date   Anxiety    Endometriosis 07/2015   GERD (gastroesophageal reflux disease)    NO MEDS   Headache    Ovarian cyst 07/2015   Past Surgical History:  Procedure Laterality Date   CESAREAN SECTION N/A 09/20/2020   Procedure: CESAREAN SECTION;  Surgeon: Osborne Oman, MD;  Location: [facility];  Service: Obstetrics;  Laterality: N/A;   LAPAROSCOPIC OVARIAN CYSTECTOMY Right 07/17/2015   Procedure: EXCISION OF ENDOMETRIOSIS;  Surgeon: Gae Dry, MD;  Location: [facility] ORS;  Service: Gynecology;  Laterality: Right;   LAPAROSCOPY N/A 07/17/2015   Procedure: LAPAROSCOPY OPERATIVE;  Surgeon: Gae Dry, MD;  Location: [facility] ORS;  Service: Gynecology;  Laterality: N/A;   The following portions of the patient's history were reviewed and updated as appropriate: allergies, current medications, past family history, past medical history, past social history, past surgical  history and problem list.   Health Maintenance:  Normal pap and negative HRHPV on 06-05-2021.  Review of Systems:  Pertinent items noted in HPI and remainder of comprehensive ROS otherwise negative.  Physical Exam:   General:  Alert, oriented and cooperative.   Mental Status: Normal mood and affect perceived. Normal judgment and thought content.  Physical exam deferred due to nature of the encounter  Labs and Imaging Results for orders placed or performed during the hospital encounter of 06/17/21 (from the past 336 hour(s))  CBC with Differential   Collection Time: 06/17/21 11:00 PM  Result Value Ref Range   WBC 7.6 4.0 - 10.5 K/uL   RBC 4.20 3.87 - 5.11 MIL/uL   Hemoglobin 13.8 12.0 - 15.0 g/dL   HCT 38.8 36.0 - 46.0 %   MCV 92.4 80.0 - 100.0 fL   MCH 32.9 26.0 - 34.0 pg   MCHC 35.6 30.0 - 36.0 g/dL   RDW 12.4 11.5 - 15.5 %   Platelets 312 150 - 400 K/uL   nRBC 0.0 0.0 - 0.2 %   Neutrophils Relative % 58 %   Neutro Abs 4.4 1.7 - 7.7 K/uL   Lymphocytes Relative 34 %   Lymphs Abs 2.6 0.7 - 4.0 K/uL   Monocytes Relative 6 %   Monocytes Absolute 0.5 0.1 - 1.0 K/uL   Eosinophils Relative 1 %   Eosinophils Absolute 0.0 0.0 - 0.5 K/uL   Basophils Relative 1 %   Basophils Absolute 0.0 0.0 - 0.1 K/uL   Immature Granulocytes 0 %   Abs Immature Granulocytes 0.03 0.00 - 0.07 K/uL  Basic metabolic panel   Collection Time: 06/17/21 11:00 PM  Result Value Ref Range   Sodium 136 135 - 145 mmol/L   Potassium 2.9 (L) 3.5 - 5.1 mmol/L   Chloride 107 98 - 111 mmol/L   CO2 22 22 - 32 mmol/L   Glucose, Bld 204 (H) 70 - 99 mg/dL   BUN 17 6 - 20 mg/dL   Creatinine, Ser 0.75 0.44 - 1.00 mg/dL   Calcium 8.7 (L) 8.9 - 10.3 mg/dL   GFR, Estimated >60 >60 mL/min   Anion gap 7 5 - 15  hCG, serum, qualitative   Collection Time: 06/18/21 12:20 AM  Result Value Ref Range   Preg, Serum NEGATIVE NEGATIVE  Results for orders placed or performed in visit on 06/05/21 (from the past 336 hour(s))   Cervicovaginal ancillary only( [place])   Collection Time: 06/05/21  4:37 PM  Result Value Ref Range   Neisseria Gonorrhea Negative    Chlamydia Negative    Trichomonas Negative    Bacterial Vaginitis (gardnerella) Negative    Candida Vaginitis Positive (A)    Candida Glabrata Negative    Comment Normal Reference Range Candida Species - Negative    Comment Normal Reference Range Candida Galbrata - Negative    Comment Normal Reference Range Trichomonas - Negative    Comment Normal Reference [place] Chlamydia - Negative    Comment      Normal Reference Range Neisseria Gonorrhea - Negative   Comment      Normal Reference Range Bacterial Vaginosis - Negative  Cytology - PAP( [place])   Collection Time: 06/05/21  4:37 PM  Result Value Ref Range   Adequacy      Satisfactory for evaluation; transformation zone component PRESENT.   Diagnosis      - Negative for Intraepithelial Lesions or Malignancy (NILM)   Diagnosis - Benign reactive/reparative changes   HIV Antibody (routine testing w rflx)   Collection Time: 06/05/21  4:40 PM  Result Value Ref Range   HIV Screen 4th Generation wRfx Non Reactive Non Reactive  Hepatitis B surface antigen   Collection Time: 06/05/21  4:40 PM  Result Value Ref Range   Hepatitis B Surface Ag Negative Negative  RPR   Collection Time: 06/05/21  4:40 PM  Result Value Ref Range   RPR Ser Ql Non Reactive Non Reactive  Hepatitis C antibody   Collection Time: 06/05/21  4:40 PM  Result Value Ref Range   Hep C Virus Ab <0.1 0.0 - 0.9 s/co ratio   No results found.    Assessment and Plan:     1. Encounter for surveillance of Nexplanon subdermal contraceptive - normal expected BTB immediately after Nexplanon insertion - this was explained to the patient, and she was instructed to call if bleeding becomes heavy, and we will treat with hormones     I discussed the assessment and treatment plan with the patient. The patient was provided an  opportunity to ask questions and all were answered. The patient agreed with the plan and demonstrated an understanding of the instructions.   The patient was advised to call back or seek an in-person evaluation/go to the ED if the symptoms worsen or if the condition fails to improve as anticipated.  I have spent a total of 10 minutes of non-face-to-face time, excluding clinical staff time, reviewing notes and preparing to see patient, ordering tests and/or medications, and counseling the patient.    Baltazar Najjar, MD [facility] 06/19/21

## 2021-06-19 NOTE — Progress Notes (Signed)
Telephone visit: Nexplanon in January, breakthrough bleeding was occ now every day.Sometimes needs just panty liner others needs tampon. Wants to discuss OCP for bleeding

## 2021-07-03 ENCOUNTER — Encounter (HOSPITAL_COMMUNITY): Payer: Self-pay

## 2021-07-03 ENCOUNTER — Other Ambulatory Visit: Payer: Self-pay

## 2021-07-03 ENCOUNTER — Ambulatory Visit (HOSPITAL_COMMUNITY)
Admission: RE | Admit: 2021-07-03 | Discharge: 2021-07-03 | Disposition: A | Discharge status: Home / Self Care | Payer: Medicaid Other | Source: Ambulatory Visit | Attending: Nurse Practitioner | Admitting: Nurse Practitioner

## 2021-07-03 VITALS — BP 118/84 | HR 79 | Temp 98.4°F | Resp 17

## 2021-07-03 DIAGNOSIS — Z202 Contact with and (suspected) exposure to infections with a predominantly sexual mode of transmission: Secondary | ICD-10-CM

## 2021-07-03 DIAGNOSIS — Z113 Encounter for screening for infections with a predominantly sexual mode of transmission: Secondary | ICD-10-CM | POA: Insufficient documentation

## 2021-07-03 DIAGNOSIS — N921 Excessive and frequent menstruation with irregular cycle: Secondary | ICD-10-CM | POA: Insufficient documentation

## 2021-07-03 LAB — HIV ANTIBODY (ROUTINE TESTING W REFLEX): HIV Screen 4th Generation wRfx: NONREACTIVE

## 2021-07-03 LAB — HCG, QUANTITATIVE, PREGNANCY: hCG, Beta Chain, Quant, S: <1 m[IU]/mL (ref <5)

## 2021-07-03 LAB — POC URINE PREG, ED: Preg Test, Ur: NEGATIVE

## 2021-07-03 NOTE — Discharge Instructions (Addendum)
We will let you know if any testing comes back positive.  If the bleeding becomes heavy or with any new vaginal discharge, please seek care with [place] or OB/GYN.  Please wear condoms with every sexual encounter to prevent transmission of STI. ?

## 2021-07-03 NOTE — ED Triage Notes (Signed)
Pt presents for STD testing.  ? ?States she wants a pregnancy test and states she has been spotting.  ?

## 2021-07-03 NOTE — ED Provider Notes (Signed)
?[facility] ? ? ? ?CSN: [id number] ?Arrival date & time: 07/03/21  1531 ? ? ?  ? ?History   ?Chief Complaint ?Chief Complaint  ?Patient presents with  ? SEXUALLY TRANSMITTED DISEASE  ?  Appt  ? ? ?HPI ?Kelly Lane is a 26 y.o. female.  ? ?Patient presents with request to be tested for STI today.  She is also requesting testing for pregnancy today via urine and blood testing.  She is sexually active.  She denies vaginal discharge, itching, rashes/lesions, sores on her genitalia.  She denies any fevers, nausea/vomiting, changes in her appetite, and swelling in her groin. ? ?She reports breakthrough bleeding, thinks likely due to her Nexplanon.  She is no longer breastfeeding. ? ? ?Past Medical History:  ?Diagnosis Date  ? Anxiety   ? Endometriosis 07/2015  ? GERD (gastroesophageal reflux disease)   ? NO MEDS  ? Headache   ? Ovarian cyst 07/2015  ? ? ?Patient Active Problem List  ? Diagnosis Date Noted  ? S/P emergency cesarean section for NRFHT 09/20/2020  ? COVID-19 affecting pregnancy in first trimester 08/30/2020  ? Severe preeclampsia, delivered 08/29/2020  ? Rh negative state in antepartum period 06/07/2020  ? Supervision of high-risk pregnancy 05/24/2020  ? ? ?Past Surgical History:  ?Procedure Laterality Date  ? CESAREAN SECTION N/A 09/20/2020  ? Procedure: CESAREAN SECTION;  Surgeon: Osborne Oman, MD;  [facility];  Service: Obstetrics;  Laterality: N/A;  ? LAPAROSCOPIC OVARIAN CYSTECTOMY Right 07/17/2015  ? Procedure: EXCISION OF ENDOMETRIOSIS;  Surgeon: Gae Dry, MD;  Location: [facility] ORS;  Service: Gynecology;  Laterality: Right;  ? LAPAROSCOPY N/A 07/17/2015  ? Procedure: LAPAROSCOPY OPERATIVE;  Surgeon: Gae Dry, MD;  Location: [facility] ORS;  Service: Gynecology;  Laterality: N/A;  ? ? ?OB History   ? ? Gravida  ?2  ? Para  ?1  ? Term  ?0  ? Preterm  ?1  ? AB  ?1  ? Living  ?1  ?  ? ? SAB  ?0  ? IAB  ?1  ? Ectopic  ?0  ? Multiple  ?0  ? Live Births  ?1  ?   ?  ?  ? ? ? ?Home  Medications   ? ?Prior to Admission medications   ?Medication Sig Start Date End Date Taking? Authorizing Provider  ?ibuprofen (ADVIL) 600 MG tablet Take 1 tablet (600 mg total) by mouth every 8 (eight) hours as needed for cramping. Take with food 06/15/21   [name], FNP  ? ? ?Family History ?Family History  ?Problem Relation Age of Onset  ? Hypertension Mother   ? ? ?Social History ?Social History  ? ?Tobacco Use  ? Smoking status: Never  ? Smokeless tobacco: Never  ?Vaping Use  ? Vaping Use: Never used  ?Substance Use Topics  ? Alcohol use: Not Currently  ?  Comment: every six months maybe 1 drink  ? Drug use: No  ? ? ? ?Allergies   ?Patient has no known allergies. ? ? ?Review of Systems ?Review of Systems ?Per HPI ? ?Physical Exam ?Triage Vital Signs ?ED Triage Vitals  ?Enc Vitals Group  ?   BP 07/03/21 1542 118/84  ?   Pulse Rate 07/03/21 1542 79  ?   Resp 07/03/21 1542 17  ?   Temp 07/03/21 1542 98.4 ?F (36.9 ?C)  ?   Temp Source 07/03/21 1542 Oral  ?   SpO2 07/03/21 1542 98 %  ?  Weight --   ?   Height --   ?   Head Circumference --   ?   Peak Flow --   ?   Pain Score 07/03/21 1540 0  ?   Pain Loc --   ?   Pain Edu? --   ?   Excl. in [place]? --   ? ?No data found. ? ?Updated Vital Signs ?BP 118/84 (BP Location: Left Arm)   Pulse 79   Temp 98.4 ?F (36.9 ?C) (Oral)   Resp 17   SpO2 98%  ? ?Visual Acuity ?Right Eye Distance:   ?Left Eye Distance:   ?Bilateral Distance:   ? ?Right Eye Near:   ?Left Eye Near:    ?Bilateral Near:    ? ?Physical Exam ?Vitals and nursing note reviewed.  ?Constitutional:   ?   General: She is not in acute distress. ?   Appearance: Normal appearance. She is not toxic-appearing.  ?Pulmonary:  ?   Effort: Pulmonary effort is normal. No respiratory distress.  ?Genitourinary: ?   Comments: deferred ?Skin: ?   General: Skin is warm and dry.  ?   Capillary Refill: Capillary refill takes less than 2 seconds.  ?   Coloration: Skin is not jaundiced or pale.  ?   Findings: No erythema.   ?Neurological:  ?   Mental Status: She is alert and oriented to person, place, and time.  ?   Motor: No weakness.  ?Psychiatric:     ?   Mood and Affect: Mood normal.     ?   Behavior: Behavior normal.     ?   Thought Content: Thought content normal.     ?   Judgment: Judgment normal.  ? ? ? ?UC Treatments / Results  ?Labs ?(all labs ordered are listed, but only abnormal results are displayed) ?Labs Reviewed  ?HCG, QUANTITATIVE, PREGNANCY  ?RPR  ?HIV ANTIBODY (ROUTINE TESTING W REFLEX)  ?POC URINE PREG, ED  ?CERVICOVAGINAL ANCILLARY ONLY  ? ? ?EKG ? ? ?Radiology ?No results found. ? ?Procedures ?Procedures (including critical care time) ? ?Medications Ordered in UC ?Medications - No data to display ? ?Initial Impression / Assessment and Plan / UC Course  ?I have reviewed the triage vital signs and the nursing notes. ? ?Pertinent labs & imaging results that were available during my care of the patient were reviewed by me and considered in my medical decision making (see chart for details). ? ?  ?Will perform STI per patient request;  notify and treat with any positive testing.  Urine pregnancy test is negative today; patient requests confirmatory testing with blood test and this is reasonable.  Will call with abnormal results.  If vaginal discharge starts or with any heavy menstrual bleeding, follow up with OB/GYN.  Encouraged condom use with every sexual encounter. ? ?Final Clinical Impressions(s) / UC Diagnoses  ? ?Final diagnoses:  ?Routine screening for STI (sexually transmitted infection)  ?Breakthrough bleeding  ? ? ? ?Discharge Instructions   ? ?  ?We will let you know if any testing comes back positive.  If the bleeding becomes heavy or with any new vaginal discharge, please seek care with [place] or OB/GYN.  Please wear condoms with every sexual encounter to prevent transmission of STI. ? ? ? ? ?ED Prescriptions   ?None ?  ? ?PDMP not reviewed this encounter. ?  ?Eulogio Bear, NP ?07/03/21 1645 ? ?

## 2021-07-04 LAB — RPR: RPR Ser Ql: NONREACTIVE

## 2021-07-08 LAB — CERVICOVAGINAL ANCILLARY ONLY
Bacterial Vaginitis (gardnerella): NEGATIVE
Candida Glabrata: NEGATIVE
Candida Vaginitis: NEGATIVE
Chlamydia: NEGATIVE
Comment: NEGATIVE
Comment: NEGATIVE
Comment: NEGATIVE
Comment: NEGATIVE
Comment: NEGATIVE
Comment: NORMAL
Neisseria Gonorrhea: NEGATIVE
Trichomonas: NEGATIVE

## 2021-08-25 ENCOUNTER — Other Ambulatory Visit: Payer: Self-pay | Admitting: Obstetrics & Gynecology

## 2021-08-25 ENCOUNTER — Telehealth: Payer: Medicaid Other | Admitting: Family

## 2021-08-25 DIAGNOSIS — Z760 Encounter for issue of repeat prescription: Secondary | ICD-10-CM

## 2021-08-25 DIAGNOSIS — N939 Abnormal uterine and vaginal bleeding, unspecified: Secondary | ICD-10-CM

## 2021-08-25 NOTE — Progress Notes (Signed)
Based on what you shared with me, I feel your condition warrants further evaluation and I recommend that you be seen in a face to face visit. ?  ?NOTE: There will be NO CHARGE for this eVisit ? ?Given your current symptoms, I recommend being seen in person.  ?  ?If you are having a true medical emergency please call 911.   ?  ? For an urgent face to face visit, [place] has six urgent care centers for your convenience:  ?  ? [facility] ?Get Driving Directions ?[phone] ?[address] ?[place], [place] [address] ?  ? [facility]) ?Get Driving Directions ?[phone] ?[address] ?[place], [place] [address] ? ?[facility] [place] [facility]) ?Get Driving Directions ?[place],  [place]  [address] ? ?[facility] ?Get Driving Directions ?[phone] ?[address], [address] ?[place], [place] [address] ?  ?[facility] [place] ?Get Driving Directions  ?[phone] ?[address] ?[address] ?[place], [place] [address] ?  ?[facility] at [facility] ?Get Driving Directions ?[phone] ?[address] ?[place], [place] [address] ? ?Your MyChart E-visit questionnaire answers were reviewed by a board certified advanced clinical practitioner to complete your personal care plan based on your specific symptoms.  Thank you for using e-Visits. ?  ? ?

## 2021-08-25 NOTE — Progress Notes (Signed)
Hello, ?Why do you need a prescription for aspirin? ? ?Evelina Dun, FNP ? ?

## 2021-08-30 ENCOUNTER — Encounter: Payer: Self-pay | Admitting: Obstetrics

## 2021-08-30 ENCOUNTER — Telehealth: Payer: Medicaid Other | Admitting: Physician Assistant

## 2021-08-30 DIAGNOSIS — Z0289 Encounter for other administrative examinations: Secondary | ICD-10-CM

## 2021-08-30 NOTE — Progress Notes (Signed)
Patient needing a form for health assessment for employment. Last CPE was done through [facility] and they would not complete form. Advised that since patient has been seeing OB/GYN regularly and using them as a PCP that she could reach out and ask their office if they would complete form. If not, she would require an in person visit with [facility]. She messaged OB/GYN while on call and will await their response. If not, she will be seen at [facility]. ? ?Marked No charge as we could not assist in this manner.  ?

## 2021-09-02 ENCOUNTER — Telehealth (INDEPENDENT_AMBULATORY_CARE_PROVIDER_SITE_OTHER): Payer: Medicaid Other | Admitting: Family Medicine

## 2021-09-02 DIAGNOSIS — Z91199 Patient's noncompliance with other medical treatment and regimen due to unspecified reason: Secondary | ICD-10-CM

## 2021-09-02 NOTE — Progress Notes (Deleted)
? ? ?  MyChart Video Visit ? ? ? ?Virtual Visit via Video Note  ? ?This visit type was conducted due to national recommendations for restrictions regarding the COVID-19 Pandemic (e.g. social distancing) in an effort to limit this patient's exposure and mitigate transmission in our community. This patient is at least at moderate risk for complications without adequate follow up. This format is felt to be most appropriate for this patient at this time. Physical exam was limited by quality of the video and audio technology used for the visit.  ? ?Patient location: *** ?Provider location: *** ? ?I discussed the limitations of evaluation and management by telemedicine and the availability of in person appointments. The patient expressed understanding and agreed to proceed. ? ?Patient: Kelly Lane   DOB: 05/29/1995   26 y.o. Female  MRN: [id number] ?Visit Date: 09/02/2021 ? ?Today's healthcare provider: Gwyneth Sprout, FNP  ? ?No chief complaint on file. ? ?Subjective  ?  ?HPI  ?*** ? ? ?Medications: ?Outpatient Medications Prior to Visit  ?Medication Sig  ? ibuprofen (ADVIL) 600 MG tablet Take 1 tablet (600 mg total) by mouth every 8 (eight) hours as needed for cramping. Take with food  ? ?No facility-administered medications prior to visit.  ? ? ?Review of Systems ? ?{Labs  Heme  Chem  Endocrine  Serology  Results Review (optional):23779} ? ? Objective  ?  ?There were no vitals taken for this visit. ? ?{Show previous vital signs (optional):23777} ? ? ?Physical Exam  ? ? ? Assessment & Plan  ?  ? ?*** ? ?No follow-ups on file.  ?  ? ?I discussed the assessment and treatment plan with the patient. The patient was provided an opportunity to ask questions and all were answered. The patient agreed with the plan and demonstrated an understanding of the instructions. ?  ?The patient was advised to call back or seek an in-person evaluation if the symptoms worsen or if the condition fails to improve as anticipated. ? ?I  provided *** minutes of non-face-to-face time during this encounter. ? ?{provider attestation***:1} ? ?Gwyneth Sprout, FNP ?[place] ?[phone] (phone) ?[phone] (fax) ? ?[facility]  ?

## 2021-09-03 DIAGNOSIS — Z111 Encounter for screening for respiratory tuberculosis: Secondary | ICD-10-CM | POA: Diagnosis not present

## 2021-09-03 NOTE — Progress Notes (Signed)
Patient needed to be seen in person; not appropriate for nature of video call as she was requesting form completion detailing a physical examination. ?Gwyneth Sprout, FNP  ?[place] ?[address] ?[place], [place] [address] ?[phone] (phone) ?[phone] (fax) ?[facility] ? ?

## 2021-09-05 DIAGNOSIS — Z021 Encounter for pre-employment examination: Secondary | ICD-10-CM | POA: Diagnosis not present

## 2021-09-05 DIAGNOSIS — Z111 Encounter for screening for respiratory tuberculosis: Secondary | ICD-10-CM | POA: Diagnosis not present

## 2021-09-15 ENCOUNTER — Other Ambulatory Visit: Payer: Self-pay

## 2021-09-15 ENCOUNTER — Emergency Department (HOSPITAL_COMMUNITY)
Admission: EM | Admit: 2021-09-15 | Discharge: 2021-09-15 | Disposition: A | Discharge status: Home / Self Care | Payer: BC Managed Care – PPO | Attending: Emergency Medicine | Admitting: Emergency Medicine

## 2021-09-15 ENCOUNTER — Encounter (HOSPITAL_COMMUNITY): Payer: Self-pay | Admitting: Emergency Medicine

## 2021-09-15 DIAGNOSIS — B084 Enteroviral vesicular stomatitis with exanthem: Secondary | ICD-10-CM | POA: Diagnosis not present

## 2021-09-15 DIAGNOSIS — R21 Rash and other nonspecific skin eruption: Secondary | ICD-10-CM | POA: Diagnosis not present

## 2021-09-15 NOTE — ED Triage Notes (Signed)
C/o small itchy rash to R wrist that started today. ?

## 2021-09-15 NOTE — Discharge Instructions (Addendum)
You were diagnosed today with hand-foot-and-mouth disease.  This is a self-limited disease.  I recommend using ibuprofen or other NSAIDs as needed for pain. If you continue to itch you may consider an antihistamine over-the-counter.  Please practice good hand hygiene to help prevent the spread.  Follow-up with your primary care as needed. ?

## 2021-09-15 NOTE — ED Provider Notes (Signed)
?[facility] ?Provider Note ? ? ?CSN: [id number] ?Arrival date & time: 09/15/21  1401 ? ?  ? ?History ? ?Chief Complaint  ?Patient presents with  ? Rash  ? ? ?Kelly Lane is a 26 y.o. female.  Patient presents to the hospital with a chief complaint of small rash on her right anterior wrist and small lesion on the back of her left thumb.  Patient states that she felt "itchy" since yesterday including some discomfort and itchiness around her mouth.  Patient states that her child was diagnosed with hand-foot-and-mouth disease this morning at the emergency department.  Patient denies any known fevers at home.  Patient denies any new laundry detergent, any known environmental, new soaps or perfumes.  Past medical history significant for GERD, anxiety ? ?HPI ? ?  ? ?Home Medications ?Prior to Admission medications   ?Medication Sig Start Date End Date Taking? Authorizing Provider  ?ibuprofen (ADVIL) 600 MG tablet Take 1 tablet (600 mg total) by mouth every 8 (eight) hours as needed for cramping. Take with food 06/15/21   [name], FNP  ?   ? ?Allergies    ?Patient has no known allergies.   ? ?Review of Systems   ?Review of Systems  ?Constitutional:  Negative for fever.  ?HENT:  Negative for mouth sores.   ?Skin:  Positive for rash.  ?     Rash with pruritus  ? ?Physical Exam ?Updated Vital Signs ?BP (!) 132/99 (BP Location: Right Arm)   Pulse 100   Temp 99.1 ?F (37.3 ?C) (Oral)   Resp 14   SpO2 94%  ?Physical Exam ?Vitals and nursing note reviewed.  ?Constitutional:   ?   General: She is not in acute distress. ?HENT:  ?   Head: Normocephalic and atraumatic.  ?   Mouth/Throat:  ?   Mouth: Mucous membranes are moist.  ?   Pharynx: Oropharynx is clear.  ?Eyes:  ?   Conjunctiva/sclera: Conjunctivae normal.  ?Cardiovascular:  ?   Rate and Rhythm: Normal rate.  ?   Pulses: Normal pulses.  ?Pulmonary:  ?   Effort: Pulmonary effort is normal.  ?Skin: ?   General: Skin is warm and  dry.  ?   Findings: Rash present.  ?   Comments: Vesicular rash noted on anterior right wrist.  Small red papule noted on left thumb.  No rash or sores noted on soles of feet or with patient's mouth  ?Neurological:  ?   Mental Status: She is alert.  ? ? ?ED Results / Procedures / Treatments   ?Labs ?(all labs ordered are listed, but only abnormal results are displayed) ?Labs Reviewed - No data to display ? ?EKG ?None ? ?Radiology ?No results found. ? ?Procedures ?Procedures  ? ? ?Medications Ordered in ED ?Medications - No data to display ? ?ED Course/ Medical Decision Making/ [name] ?  ?                        ?Medical Decision Making ? ?This patient presents to the ED for concern of rash, this involves an extensive number of treatment options, and is a complaint that carries with it a high risk of complications and morbidity.  The differential diagnosis includes hand-foot-and-mouth disease, contact dermatitis, and others ? ? ?Test / Admission - Considered: ? ?The patient's child was diagnosed with hand-foot-and-mouth disease this morning.  The patient is running a very low-grade fever at this time.  I believe she may have very early hand-foot-and-mouth disease.  She has no environmental changes that would support contact dermatitis. ? ?There is no indication for admission.  This is treated with supportive care outpatient.  I will discharge her home with recommendation for NSAIDs and cool liquids as needed.  I will also encourage good hand hygiene to help prevent spread. ? ?Final Clinical Impression(s) / ED Diagnoses ?Final diagnoses:  ?Hand, foot and mouth disease  ? ? ?Rx / DC Orders ?ED Discharge Orders   ? ? None  ? ?  ? ? ?  ?Dorothyann Peng, PA-C ?09/15/21 1630 ? ?  ?Drenda Freeze, MD ?09/15/21 2320 ? ?

## 2021-09-27 ENCOUNTER — Telehealth: Payer: BC Managed Care – PPO

## 2021-09-27 ENCOUNTER — Encounter (HOSPITAL_COMMUNITY): Payer: Self-pay

## 2021-09-27 ENCOUNTER — Telehealth: Payer: BC Managed Care – PPO | Admitting: Physician Assistant

## 2021-09-27 ENCOUNTER — Ambulatory Visit (HOSPITAL_COMMUNITY)
Admission: RE | Admit: 2021-09-27 | Discharge: 2021-09-27 | Disposition: A | Discharge status: Home / Self Care | Payer: BC Managed Care – PPO | Source: Ambulatory Visit | Attending: Family Medicine | Admitting: Family Medicine

## 2021-09-27 VITALS — BP 132/88 | HR 79 | Temp 98.4°F | Resp 16

## 2021-09-27 DIAGNOSIS — N939 Abnormal uterine and vaginal bleeding, unspecified: Secondary | ICD-10-CM | POA: Insufficient documentation

## 2021-09-27 DIAGNOSIS — J069 Acute upper respiratory infection, unspecified: Secondary | ICD-10-CM | POA: Diagnosis not present

## 2021-09-27 DIAGNOSIS — L239 Allergic contact dermatitis, unspecified cause: Secondary | ICD-10-CM | POA: Insufficient documentation

## 2021-09-27 DIAGNOSIS — B3731 Acute candidiasis of vulva and vagina: Secondary | ICD-10-CM | POA: Diagnosis not present

## 2021-09-27 LAB — POC URINE PREG, ED: Preg Test, Ur: NEGATIVE

## 2021-09-27 MED ORDER — NAPROXEN 375 MG PO TABS: 375.0000 mg | ORAL_TABLET | Freq: Two times a day (BID) | ORAL | 0 refills | Status: DC

## 2021-09-27 MED ORDER — TRIAMCINOLONE ACETONIDE 0.1 % EX CREA: 1.0000 "application " | TOPICAL_CREAM | Freq: Two times a day (BID) | CUTANEOUS | 0 refills | Status: DC

## 2021-09-27 NOTE — Patient Instructions (Signed)
  Kelly Lane, thank you for joining Leeanne Rio, PA-C for today's virtual visit.  While this provider is not your primary care provider (PCP), if your PCP is located in our provider database this encounter information will be shared with them immediately following your visit.  Consent: (Patient) Kelly Lane provided verbal consent for this virtual visit at the beginning of the encounter.  Current Medications:  Current Outpatient Medications:    ibuprofen (ADVIL) 600 MG tablet, Take 1 tablet (600 mg total) by mouth every 8 (eight) hours as needed for cramping. Take with food, Disp: 30 tablet, Rfl: 0   Medications ordered in this encounter:  No orders of the defined types were placed in this encounter.    *If you need refills on other medications prior to your next appointment, please contact your pharmacy*  Follow-Up: Call back or seek an in-person evaluation if the symptoms worsen or if the condition fails to improve as anticipated.  Other Instructions   If you have been instructed to have an in-person evaluation today at a local Urgent Care facility, please use the link below. It will take you to a list of all of our available [facility], including address, phone number and hours of operation. Please do not delay care.  [facility]  If you or a family member do not have a primary care provider, use the link below to schedule a visit and establish care. When you choose a [facility] primary care physician or advanced practice provider, you gain a long-term partner in health. Find a Primary Care Provider  Learn more about [facility]'s in-office and virtual care options: [facility]

## 2021-09-27 NOTE — ED Provider Notes (Signed)
[facility]    CSN: [id number] Arrival date & time: 09/27/21  1627      History   Chief Complaint Chief Complaint  Patient presents with   Vaginal Bleeding    Entered by patient   Rash    HPI Kelly Lane is a 26 y.o. female.    Vaginal Bleeding Rash Here for irregular vaginal bleeding.  She does have the Nexplanon.  This irregular bleeding has gotten worse in the last month or so.  No dysuria, abdominal pain, or vomiting.  No fever or chills.  She would like STD screening.  She would like to try taking an NSAID to see if it would reduce the bleeding.  She also has a pruritic rash on her left hand and on her right wrist.  Its been there for a week or 2.  Past Medical History:  Diagnosis Date   Anxiety    Endometriosis 07/2015   GERD (gastroesophageal reflux disease)    NO MEDS   Headache    Ovarian cyst 07/2015    Patient Active Problem List   Diagnosis Date Noted   S/P emergency cesarean section for NRFHT 09/20/2020   COVID-19 affecting pregnancy in first trimester 08/30/2020   Severe preeclampsia, delivered 08/29/2020   Rh negative state in antepartum period 06/07/2020   Supervision of high-risk pregnancy 05/24/2020    Past Surgical History:  Procedure Laterality Date   CESAREAN SECTION N/A 09/20/2020   Procedure: CESAREAN SECTION;  Surgeon: Osborne Oman, MD;  Location: [facility] ORS;  Service: Obstetrics;  Laterality: N/A;   LAPAROSCOPIC OVARIAN CYSTECTOMY Right 07/17/2015   Procedure: EXCISION OF ENDOMETRIOSIS;  Surgeon: Gae Dry, MD;  Location: [facility] ORS;  Service: Gynecology;  Laterality: Right;   LAPAROSCOPY N/A 07/17/2015   Procedure: LAPAROSCOPY OPERATIVE;  Surgeon: Gae Dry, MD;  Location: [facility] ORS;  Service: Gynecology;  Laterality: N/A;    OB History     Gravida  2   Para  1   Term  0   Preterm  1   AB  1   Living  1      SAB  0   IAB  1   Ectopic  0   Multiple  0   Live Births  1             Home Medications    Prior to Admission medications   Medication Sig Start Date End Date Taking? Authorizing Provider  naproxen (NAPROSYN) 375 MG tablet Take 1 tablet (375 mg total) by mouth 2 (two) times daily. 09/27/21  Yes Barrett Henle, MD  triamcinolone cream (KENALOG) 0.1 % Apply 1 application. topically 2 (two) times daily. To affected area till better 09/27/21  Yes Eustacio Ellen, Gwenlyn Perking, MD    Family History Family History  Problem Relation Age of Onset   Hypertension Mother     Social History Social History   Tobacco Use   Smoking status: Never   Smokeless tobacco: Never  Vaping Use   Vaping Use: Never used  Substance Use Topics   Alcohol use: Not Currently    Comment: every six months maybe 1 drink   Drug use: No     Allergies   Patient has no known allergies.   Review of Systems Review of Systems  Genitourinary:  Positive for vaginal bleeding.  Skin:  Positive for rash.    Physical Exam Triage Vital Signs ED Triage Vitals  Enc Vitals Group  BP 09/27/21 1646 132/88     Pulse Rate 09/27/21 1646 79     Resp 09/27/21 1646 16     Temp 09/27/21 1646 98.4 F (36.9 C)     Temp Source 09/27/21 1646 Oral     SpO2 09/27/21 1646 98 %     Weight --      Height --      Head Circumference --      Peak Flow --      Pain Score 09/27/21 1645 0     Pain Loc --      Pain Edu? --      Excl. in [place]? --    No data found.  Updated Vital Signs BP 132/88 (BP Location: Left Arm)   Pulse 79   Temp 98.4 F (36.9 C) (Oral)   Resp 16   SpO2 98%   Visual Acuity Right Eye Distance:   Left Eye Distance:   Bilateral Distance:    Right Eye Near:   Left Eye Near:    Bilateral Near:     Physical Exam Vitals reviewed.  Constitutional:      General: She is not in acute distress.    Appearance: She is not ill-appearing, toxic-appearing or diaphoretic.  HENT:     Nose: Nose normal.     Mouth/Throat:     Mouth: Mucous membranes are moist.  Eyes:      Extraocular Movements: Extraocular movements intact.     Pupils: Pupils are equal, round, and reactive to light.  Cardiovascular:     Rate and Rhythm: Normal rate and regular rhythm.     Heart sounds: No murmur heard. Pulmonary:     Effort: Pulmonary effort is normal.     Breath sounds: Normal breath sounds.  Skin:    Findings: Rash (there is a maculopapular rash on her right wrist and the dorsum of her left index finger.) present.  Neurological:     General: No focal deficit present.     Mental Status: She is oriented to person, place, and time.  Psychiatric:        Behavior: Behavior normal.     UC Treatments / Results  Labs (all labs ordered are listed, but only abnormal results are displayed) Labs Reviewed  POC URINE PREG, ED  CERVICOVAGINAL ANCILLARY ONLY    EKG   Radiology No results found.  Procedures Procedures (including critical care time)  Medications Ordered in UC Medications - No data to display  Initial Impression / Assessment and Plan / UC Course  I have reviewed the triage vital signs and the nursing notes.  Pertinent labs & imaging results that were available during my care of the patient were reviewed by me and considered in my medical decision making (see chart for details).     UPT is negative.  Her rash now looks like a contact dermatitis.  Topical steroid applied since it is a small area  We will try naproxen twice daily for the bleeding.  She will follow-up with her gynecologist.  We will notify her and treat per protocol if anything is positive on the swab Final Clinical Impressions(s) / UC Diagnoses   Final diagnoses:  Abnormal uterine bleeding  Allergic contact dermatitis, unspecified trigger     Discharge Instructions      Staff will call you if anything is positive on your swab, and it should result in the next 2 days.  Triamcinolone cream--applied 2 times daily to the rash area till better  Naproxen 375 mg--1 tab twice  daily as needed     ED Prescriptions     Medication Sig Dispense Auth. Provider   triamcinolone cream (KENALOG) 0.1 % Apply 1 application. topically 2 (two) times daily. To affected area till better 80 g Barrett Henle, MD   naproxen (NAPROSYN) 375 MG tablet Take 1 tablet (375 mg total) by mouth 2 (two) times daily. 20 tablet Vallorie Niccoli, Gwenlyn Perking, MD      PDMP not reviewed this encounter.   Barrett Henle, MD 09/27/21 1719

## 2021-09-27 NOTE — ED Notes (Signed)
Pt adds that her child recently had hand, foot and mouth and patient went a head and was seen at ED for symptoms of itching her self little over week ago. Reports that still has spots on bilat hands and itching on right wrist area.

## 2021-09-27 NOTE — Discharge Instructions (Addendum)
Staff will call you if anything is positive on your swab, and it should result in the next 2 days.  Triamcinolone cream--applied 2 times daily to the rash area till better  Naproxen 375 mg--1 tab twice daily as needed

## 2021-09-27 NOTE — ED Triage Notes (Signed)
Pt reports having vaginal bleeding for 2 weeks that will lighten up then become heavy again. Unable to get into GYN and requesting STD testing.

## 2021-09-27 NOTE — Progress Notes (Signed)
Virtual Visit Consent   Kelly Lane, you are scheduled for a virtual visit with a [facility] provider today. Just as with appointments in the office, your consent must be obtained to participate. Your consent will be active for this visit and any virtual visit you may have with one of our providers in the next 365 days. If you have a MyChart account, a copy of this consent can be sent to you electronically.  As this is a virtual visit, video technology does not allow for your provider to perform a traditional examination. This may limit your provider's ability to fully assess your condition. If your provider identifies any concerns that need to be evaluated in person or the need to arrange testing (such as labs, EKG, etc.), we will make arrangements to do so. Although advances in technology are sophisticated, we cannot ensure that it will always work on either your end or our end. If the connection with a video visit is poor, the visit may have to be switched to a telephone visit. With either a video or telephone visit, we are not always able to ensure that we have a secure connection.  By engaging in this virtual visit, you consent to the provision of healthcare and authorize for your insurance to be billed (if applicable) for the services provided during this visit. Depending on your insurance coverage, you may receive a charge related to this service.  I need to obtain your verbal consent now. Are you willing to proceed with your visit today? Kelly Lane has provided verbal consent on 09/27/2021 for a virtual visit (video or telephone). Leeanne Rio, [place]  Date: 09/27/2021 12:35 PM  Virtual Visit via Video Note   I, Leeanne Rio, connected with  Kelly Lane  ([id number], Apr 23, 1996) on 09/27/21 at 12:30 PM EDT by a video-enabled telemedicine application and verified that I am speaking with the correct person using two identifiers.  Location: Patient: Virtual Visit Location  Patient: Home Provider: Virtual Visit Location Provider: Home Office   I discussed the limitations of evaluation and management by telemedicine and the availability of in person appointments. The patient expressed understanding and agreed to proceed.    History of Present Illness: Kelly Lane is a 25 y.o. who identifies as a female who was assigned female at birth, and is being seen today for some head and sinus congestion over the past 3 days with a low-grade fever. Worse on Monday evening when starting but has been improving since onset. Mild cough that is dry. Is taking allergy medication and OTC cold and sinus with improvement. Was out of work today and needing note for her job.   HPI: HPI  Problems:  Patient Active Problem List   Diagnosis Date Noted   S/P emergency cesarean section for NRFHT 09/20/2020   COVID-19 affecting pregnancy in first trimester 08/30/2020   Severe preeclampsia, delivered 08/29/2020   Rh negative state in antepartum period 06/07/2020   Supervision of high-risk pregnancy 05/24/2020    Allergies: No Known Allergies Medications:  Current Outpatient Medications:    ibuprofen (ADVIL) 600 MG tablet, Take 1 tablet (600 mg total) by mouth every 8 (eight) hours as needed for cramping. Take with food, Disp: 30 tablet, Rfl: 0  Observations/Objective: Patient is well-developed, well-nourished in no acute distress.  Resting comfortably at home.  Head is normocephalic, atraumatic.  No labored breathing. Speech is clear and coherent with logical content.  Patient is alert and oriented at baseline.  Assessment and Plan: 1. Viral URI with cough  Mild symptoms. Already improving. Supportive measures and OTC medications reviewed. Work note provided for today.   Follow Up Instructions: I discussed the assessment and treatment plan with the patient. The patient was provided an opportunity to ask questions and all were answered. The patient agreed with the plan and  demonstrated an understanding of the instructions.  A copy of instructions were sent to the patient via MyChart unless otherwise noted below.   The patient was advised to call back or seek an in-person evaluation if the symptoms worsen or if the condition fails to improve as anticipated.  Time:  I spent 8 minutes with the patient via telehealth technology discussing the above problems/concerns.    Leeanne Rio, PA-C

## 2021-10-01 LAB — CERVICOVAGINAL ANCILLARY ONLY
Bacterial Vaginitis (gardnerella): NEGATIVE
Candida Glabrata: NEGATIVE
Candida Vaginitis: POSITIVE — AB
Chlamydia: NEGATIVE
Comment: NEGATIVE
Comment: NEGATIVE
Comment: NEGATIVE
Comment: NEGATIVE
Comment: NEGATIVE
Comment: NORMAL
Neisseria Gonorrhea: NEGATIVE
Trichomonas: NEGATIVE

## 2021-10-02 ENCOUNTER — Telehealth (HOSPITAL_COMMUNITY): Payer: Self-pay | Admitting: Emergency Medicine

## 2021-10-02 MED ORDER — FLUCONAZOLE 150 MG PO TABS: 150.0000 mg | ORAL_TABLET | Freq: Once | ORAL | 0 refills | Status: AC

## 2021-10-03 ENCOUNTER — Telehealth (HOSPITAL_COMMUNITY): Payer: Self-pay | Admitting: Emergency Medicine

## 2021-10-03 MED ORDER — FLUCONAZOLE 150 MG PO TABS: 150.0000 mg | ORAL_TABLET | Freq: Once | ORAL | 0 refills | Status: AC

## 2021-10-08 DIAGNOSIS — F4323 Adjustment disorder with mixed anxiety and depressed mood: Secondary | ICD-10-CM | POA: Diagnosis not present

## 2021-10-22 DIAGNOSIS — F4323 Adjustment disorder with mixed anxiety and depressed mood: Secondary | ICD-10-CM | POA: Diagnosis not present

## 2021-11-08 ENCOUNTER — Ambulatory Visit (HOSPITAL_COMMUNITY)
Admission: RE | Admit: 2021-11-08 | Discharge: 2021-11-08 | Disposition: A | Discharge status: Home / Self Care | Payer: BC Managed Care – PPO | Source: Ambulatory Visit | Attending: Family Medicine | Admitting: Family Medicine

## 2021-11-08 ENCOUNTER — Ambulatory Visit (INDEPENDENT_AMBULATORY_CARE_PROVIDER_SITE_OTHER): Payer: BC Managed Care – PPO

## 2021-11-08 ENCOUNTER — Other Ambulatory Visit: Payer: Self-pay | Admitting: Obstetrics

## 2021-11-08 ENCOUNTER — Encounter (HOSPITAL_COMMUNITY): Payer: Self-pay

## 2021-11-08 VITALS — BP 126/85 | HR 76 | Temp 97.6°F | Resp 17

## 2021-11-08 DIAGNOSIS — R300 Dysuria: Secondary | ICD-10-CM | POA: Insufficient documentation

## 2021-11-08 DIAGNOSIS — R059 Cough, unspecified: Secondary | ICD-10-CM | POA: Diagnosis not present

## 2021-11-08 DIAGNOSIS — R051 Acute cough: Secondary | ICD-10-CM | POA: Insufficient documentation

## 2021-11-08 DIAGNOSIS — Z30011 Encounter for initial prescription of contraceptive pills: Secondary | ICD-10-CM

## 2021-11-08 LAB — POCT URINALYSIS DIPSTICK, ED / UC
Bilirubin Urine: NEGATIVE
Glucose, UA: NEGATIVE mg/dL
Ketones, ur: NEGATIVE mg/dL
Leukocytes,Ua: NEGATIVE
Nitrite: NEGATIVE
Protein, ur: NEGATIVE mg/dL
Specific Gravity, Urine: >=1.03 (ref 1.005–1.030)
Urobilinogen, UA: 0.2 mg/dL (ref 0.0–1.0)
pH: 5 (ref 5.0–8.0)

## 2021-11-08 LAB — POC URINE PREG, ED: Preg Test, Ur: NEGATIVE

## 2021-11-08 MED ORDER — LEVOFLOXACIN 500 MG PO TABS: 500.0000 mg | ORAL_TABLET | Freq: Every day | ORAL | 0 refills | Status: DC

## 2021-11-08 MED ORDER — CHERATUSSIN AC 100-10 MG/5ML PO SOLN: 5.0000 mL | Freq: Four times a day (QID) | ORAL | 0 refills | Status: DC | PRN

## 2021-11-08 NOTE — Discharge Instructions (Addendum)
Your pregnancy test was negative  The urinalysis showed some blood but no white blood cells.  Your chest x-ray did not show any pneumonia or fluid.  Urine culture is sent.  Staff will call you if the culture shows you need a different antibiotic, and they will call you if there is anything positive on the swab you did.  Take Levaquin 500 mg--1 daily for 7 days.  This is for possible urinary infection, and it would cover atypical respiratory bacteria.  Robitussin with codeine cough syrup--5 mL or 1 teaspoon 4 times a day as needed for cough.  This can make you sleepy

## 2021-11-08 NOTE — ED Provider Notes (Addendum)
[employer]    CSN: [id number] Arrival date & time: 11/08/21  1619      History   Chief Complaint Chief Complaint  Patient presents with   Cough    Entered by patient   chest congestion   Dysuria    HPI Kelly Lane is a 26 y.o. female.    Cough Dysuria  Here for cough that is been going on for 1 week her cough does sound like it has mucus in her chest when she coughs.  No fever and no nasal congestion.  At first she had some dryness in her throat but that has resolved.  No ear pain.  No vomiting or diarrhea.  She does not really feel short of breath and she has not ever wheezed in the past  She has had a few days history of some dysuria and mild urinary frequency.  No discharge.  He is uncertain when her last period was as she has the Nexplanon  Past Medical History:  Diagnosis Date   Anxiety    Endometriosis 07/2015   GERD (gastroesophageal reflux disease)    NO MEDS   Headache    Ovarian cyst 07/2015    Patient Active Problem List   Diagnosis Date Noted   S/P emergency cesarean section for NRFHT 09/20/2020   COVID-19 affecting pregnancy in first trimester 08/30/2020   Severe preeclampsia, delivered 08/29/2020   Rh negative state in antepartum period 06/07/2020   Supervision of high-risk pregnancy 05/24/2020    Past Surgical History:  Procedure Laterality Date   CESAREAN SECTION N/A 09/20/2020   Procedure: CESAREAN SECTION;  Surgeon: Osborne Oman, MD;  Location: [facility];  Service: Obstetrics;  Laterality: N/A;   LAPAROSCOPIC OVARIAN CYSTECTOMY Right 07/17/2015   Procedure: EXCISION OF ENDOMETRIOSIS;  Surgeon: Gae Dry, MD;  Location: [facility] ORS;  Service: Gynecology;  Laterality: Right;   LAPAROSCOPY N/A 07/17/2015   Procedure: LAPAROSCOPY OPERATIVE;  Surgeon: Gae Dry, MD;  Location: [facility] ORS;  Service: Gynecology;  Laterality: N/A;    OB History     Gravida  2   Para  1   Term  0   Preterm  1   AB  1   Living   1      SAB  0   IAB  1   Ectopic  0   Multiple  0   Live Births  1            Home Medications    Prior to Admission medications   Medication Sig Start Date End Date Taking? Authorizing Provider  guaiFENesin-codeine (CHERATUSSIN AC) 100-10 MG/5ML syrup Take 5 mLs by mouth 4 (four) times daily as needed for cough. 11/08/21   Barrett Henle, MD  levofloxacin (LEVAQUIN) 500 MG tablet Take 1 tablet (500 mg total) by mouth daily. 11/08/21   Barrett Henle, MD  triamcinolone cream (KENALOG) 0.1 % Apply 1 application. topically 2 (two) times daily. To affected area till better 09/27/21   Barrett Henle, MD    Family History Family History  Problem Relation Age of Onset   Hypertension Mother     Social History Social History   Tobacco Use   Smoking status: Never   Smokeless tobacco: Never  Vaping Use   Vaping Use: Never used  Substance Use Topics   Alcohol use: Not Currently    Comment: every six months maybe 1 drink   Drug use: No  Allergies   Patient has no known allergies.   Review of Systems Review of Systems  Respiratory:  Positive for cough.   Genitourinary:  Positive for dysuria.     Physical Exam Triage Vital Signs ED Triage Vitals  Enc Vitals Group     BP 11/08/21 1729 126/85     Pulse Rate 11/08/21 1729 76     Resp 11/08/21 1729 17     Temp 11/08/21 1729 97.6 F (36.4 C)     Temp Source 11/08/21 1729 Oral     SpO2 11/08/21 1729 100 %     Weight --      Height --      Head Circumference --      Peak Flow --      Pain Score 11/08/21 1726 0     Pain Loc --      Pain Edu? --      Excl. in [place]? --    No data found.  Updated Vital Signs BP 126/85 (BP Location: Right Arm)   Pulse 76   Temp 97.6 F (36.4 C) (Oral)   Resp 17   SpO2 100%   Visual Acuity Right Eye Distance:   Left Eye Distance:   Bilateral Distance:    Right Eye Near:   Left Eye Near:    Bilateral Near:     Physical Exam Vitals reviewed.   Constitutional:      General: She is not in acute distress.    Appearance: She is not toxic-appearing.  HENT:     Nose: Nose normal.     Mouth/Throat:     Mouth: Mucous membranes are moist.     Comments: Mild erythema of the oropharynx with some clear mucus in the tonsillar crypts. Eyes:     Extraocular Movements: Extraocular movements intact.     Conjunctiva/sclera: Conjunctivae normal.     Pupils: Pupils are equal, round, and reactive to light.  Cardiovascular:     Rate and Rhythm: Normal rate and regular rhythm.     Heart sounds: No murmur heard. Pulmonary:     Effort: Pulmonary effort is normal. No respiratory distress.     Breath sounds: No wheezing, rhonchi or rales.  Chest:     Chest wall: No tenderness.  Abdominal:     Palpations: Abdomen is soft.     Tenderness: There is abdominal tenderness (suprapubic).  Musculoskeletal:     Cervical back: Neck supple.  Lymphadenopathy:     Cervical: No cervical adenopathy.  Skin:    Capillary Refill: Capillary refill takes less than 2 seconds.     Coloration: Skin is not jaundiced or pale.  Neurological:     General: No focal deficit present.     Mental Status: She is alert and oriented to person, place, and time.  Psychiatric:        Behavior: Behavior normal.      UC Treatments / Results  Labs (all labs ordered are listed, but only abnormal results are displayed) Labs Reviewed  POCT URINALYSIS DIPSTICK, ED / UC - Abnormal; Notable for the following components:      Result Value   Hgb urine dipstick MODERATE (*)    All other components within normal limits  URINE CULTURE  POC URINE PREG, ED  CERVICOVAGINAL ANCILLARY ONLY    EKG   Radiology DG Chest 2 View  Result Date: 11/08/2021 CLINICAL DATA:  Cough EXAM: CHEST - 2 VIEW COMPARISON:  05/02/2021 FINDINGS: The heart size and mediastinal contours  are within normal limits. Both lungs are clear. The visualized skeletal structures are unremarkable. IMPRESSION: No  acute abnormality of the lungs. Electronically Signed   By: Delanna Ahmadi M.D.   On: 11/08/2021 18:29    Procedures Procedures (including critical care time)  Medications Ordered in UC Medications - No data to display  Initial Impression / Assessment and Plan / UC Course  I have reviewed the triage vital signs and the nursing notes.  Pertinent labs & imaging results that were available during my care of the patient were reviewed by me and considered in my medical decision making (see chart for details).    UPT is negative  UA shows blood only.  Chest x-ray is clear. Urine culture is sent.  Staff will notify her if the urine culture indicates she needs a different medicine, and they will also call her if anything on the swab is positive  I have sent in Levaquin 1 daily for possible UTI.  I discussed with her that this would cover any atypicals if she did have an atypical pneumonia that was not shown on x-ray.  She was incredulous that she might have a virus that did not need an antibiotic.  When to return to the room with her AVS, she stated that the [facility] on [address] was closed and that she needed the prescription sent elsewhere.  Prescriptions were then sent to [facility] on [address] instead  Final Clinical Impressions(s) / UC Diagnoses   Final diagnoses:  Acute cough  Dysuria     Discharge Instructions      Your pregnancy test was negative  The urinalysis showed some blood but no white blood cells.  Your chest x-ray did not show any pneumonia or fluid.  Urine culture is sent.  Staff will call you if the culture shows you need a different antibiotic, and they will call you if there is anything positive on the swab you did.  Take Levaquin 500 mg--1 daily for 7 days.  This is for possible urinary infection, and it would cover atypical respiratory bacteria.  Robitussin with codeine cough syrup--5 mL or 1 teaspoon 4 times a day as needed for cough.  This can make  you sleepy     ED Prescriptions     Medication Sig Dispense Auth. Provider   guaiFENesin-codeine (CHERATUSSIN AC) 100-10 MG/5ML syrup  (Status: Discontinued) Take 5 mLs by mouth 4 (four) times daily as needed for cough. 120 mL Barrett Henle, MD   levofloxacin (LEVAQUIN) 500 MG tablet  (Status: Discontinued) Take 1 tablet (500 mg total) by mouth daily. 7 tablet Katielynn Horan, Gwenlyn Perking, MD   guaiFENesin-codeine (CHERATUSSIN AC) 100-10 MG/5ML syrup Take 5 mLs by mouth 4 (four) times daily as needed for cough. 120 mL Barrett Henle, MD   levofloxacin (LEVAQUIN) 500 MG tablet Take 1 tablet (500 mg total) by mouth daily. 7 tablet Kitrina Maurin, Gwenlyn Perking, MD      I have reviewed the PDMP during this encounter.   Barrett Henle, MD 11/08/21 1849    Barrett Henle, MD 11/08/21 [phone]

## 2021-11-08 NOTE — ED Triage Notes (Addendum)
Pt reports a cough and chest congestion x 1 week. States have tried Motrin with no relief.  Also requesting STD testing. States she has a burning sensation with urination x 1 day.

## 2021-11-09 LAB — URINE CULTURE: Culture: <10000 — AB

## 2021-11-11 LAB — CERVICOVAGINAL ANCILLARY ONLY
Bacterial Vaginitis (gardnerella): NEGATIVE
Candida Glabrata: NEGATIVE
Candida Vaginitis: NEGATIVE
Chlamydia: NEGATIVE
Comment: NEGATIVE
Comment: NEGATIVE
Comment: NEGATIVE
Comment: NEGATIVE
Comment: NEGATIVE
Comment: NORMAL
Neisseria Gonorrhea: NEGATIVE
Trichomonas: NEGATIVE

## 2021-11-12 DIAGNOSIS — F4323 Adjustment disorder with mixed anxiety and depressed mood: Secondary | ICD-10-CM | POA: Diagnosis not present

## 2021-11-18 DIAGNOSIS — Z3046 Encounter for surveillance of implantable subdermal contraceptive: Secondary | ICD-10-CM | POA: Diagnosis not present

## 2021-12-05 ENCOUNTER — Telehealth: Payer: BC Managed Care – PPO | Admitting: Physician Assistant

## 2021-12-05 DIAGNOSIS — K047 Periapical abscess without sinus: Secondary | ICD-10-CM | POA: Diagnosis not present

## 2021-12-06 MED ORDER — AMOXICILLIN 500 MG PO CAPS: 500.0000 mg | ORAL_CAPSULE | Freq: Three times a day (TID) | ORAL | 0 refills | Status: AC

## 2021-12-06 MED ORDER — IBUPROFEN 600 MG PO TABS: 600.0000 mg | ORAL_TABLET | Freq: Three times a day (TID) | ORAL | 0 refills | Status: DC | PRN

## 2021-12-06 NOTE — Progress Notes (Signed)

## 2021-12-22 ENCOUNTER — Inpatient Hospital Stay (HOSPITAL_COMMUNITY)
Admission: AD | Admit: 2021-12-22 | Discharge: 2021-12-22 | Disposition: A | Discharge status: Home / Self Care | Payer: BC Managed Care – PPO | Attending: Family Medicine | Admitting: Family Medicine

## 2021-12-22 ENCOUNTER — Encounter (HOSPITAL_COMMUNITY): Payer: Self-pay | Admitting: *Deleted

## 2021-12-22 ENCOUNTER — Inpatient Hospital Stay (HOSPITAL_COMMUNITY): Payer: BC Managed Care – PPO

## 2021-12-22 DIAGNOSIS — O26891 Other specified pregnancy related conditions, first trimester: Secondary | ICD-10-CM | POA: Diagnosis not present

## 2021-12-22 DIAGNOSIS — O3680X0 Pregnancy with inconclusive fetal viability, not applicable or unspecified: Secondary | ICD-10-CM

## 2021-12-22 DIAGNOSIS — O26899 Other specified pregnancy related conditions, unspecified trimester: Secondary | ICD-10-CM

## 2021-12-22 DIAGNOSIS — Z3689 Encounter for other specified antenatal screening: Secondary | ICD-10-CM | POA: Diagnosis not present

## 2021-12-22 DIAGNOSIS — Z3A01 Less than 8 weeks gestation of pregnancy: Secondary | ICD-10-CM | POA: Insufficient documentation

## 2021-12-22 LAB — URINALYSIS, ROUTINE W REFLEX MICROSCOPIC
Bilirubin Urine: NEGATIVE
Glucose, UA: NEGATIVE mg/dL
Hgb urine dipstick: NEGATIVE
Ketones, ur: NEGATIVE mg/dL
Leukocytes,Ua: NEGATIVE
Nitrite: NEGATIVE
Protein, ur: NEGATIVE mg/dL
Specific Gravity, Urine: >1.03 — ABNORMAL HIGH (ref 1.005–1.030)
pH: 5.5 (ref 5.0–8.0)

## 2021-12-22 LAB — ABO/RH
ABO/RH(D): AB NEG
Antibody Screen: NEGATIVE

## 2021-12-22 LAB — POCT PREGNANCY, URINE: Preg Test, Ur: POSITIVE — AB

## 2021-12-22 LAB — CBC
HCT: 39.2 % (ref 36.0–46.0)
Hemoglobin: 14.3 g/dL (ref 12.0–15.0)
MCH: 33.5 pg (ref 26.0–34.0)
MCHC: 36.5 g/dL — ABNORMAL HIGH (ref 30.0–36.0)
MCV: 91.8 fL (ref 80.0–100.0)
Platelets: 271 10*3/uL (ref 150–400)
RBC: 4.27 MIL/uL (ref 3.87–5.11)
RDW: 12.2 % (ref 11.5–15.5)
WBC: 6.1 10*3/uL (ref 4.0–10.5)
nRBC: 0 % (ref 0.0–0.2)

## 2021-12-22 LAB — WET PREP, GENITAL
Clue Cells Wet Prep HPF POC: NONE SEEN
Sperm: NONE SEEN
Trich, Wet Prep: NONE SEEN
WBC, Wet Prep HPF POC: <10 (ref <10)
Yeast Wet Prep HPF POC: NONE SEEN

## 2021-12-22 LAB — HCG, QUANTITATIVE, PREGNANCY: hCG, Beta Chain, Quant, S: 35 m[IU]/mL — ABNORMAL HIGH (ref <5)

## 2021-12-22 NOTE — MAU Note (Signed)
Kelly Lane is a 26 y.o. at Unknown here in MAU reporting: faint positive HPT a couple days.  Coming in mainly to confirm the test.  Has been cramping for a couple wks. No bleeding.  LMP: 7/17 Onset of complaint: couple wk Pain score: mild cramping Vitals:   12/22/21 0920 12/22/21 0922  BP: (!) 123/90 123/75  Pulse: 69 72  Resp: 16   Temp: 98.3 F (36.8 C)   SpO2: 99%       Lab orders placed from triage:  UPT, ua if +

## 2021-12-22 NOTE — Discharge Instructions (Signed)

## 2021-12-22 NOTE — MAU Provider Note (Signed)
History     CSN: [id number]  Arrival date and time: 12/22/21 4098   None     Chief Complaint  Patient presents with   Abdominal Pain   Possible Pregnancy   HPI Kelly Lane is a 26 y.o. [phone] at 76w6dby approximate LMP who presents to MAU positive pregnancy test and abdominal cramping. Patient reports mild, lower abdominal cramping for the past 2 weeks, but thought she was about to start her period. Reports yesterday she had a positive UPT at home. She denies vaginal bleeding, itching, odor, or abnormal vaginal discharge. No urinary s/s or changes in bowel habits. LMP was sometime last month, but cannot remember as she [facility]  Patient receives care at [facility].    OB History     Gravida  3   Para  1   Term  0   Preterm  1   AB  1   Living  1      SAB  0   IAB  1   Ectopic  0   Multiple  0   Live Births  1           Past Medical History:  Diagnosis Date   Anxiety    Endometriosis 07/2015   GERD (gastroesophageal reflux disease)    NO MEDS   Headache    Ovarian cyst 07/2015    Past Surgical History:  Procedure Laterality Date   CESAREAN SECTION N/A 09/20/2020   Procedure: CESAREAN SECTION;  Surgeon: AOsborne Oman MD;  Location: [facility];  Service: Obstetrics;  Laterality: N/A;   LAPAROSCOPIC OVARIAN CYSTECTOMY Right 07/17/2015   Procedure: EXCISION OF ENDOMETRIOSIS;  Surgeon: RGae Dry MD;  Location: [facility] ORS;  Service: Gynecology;  Laterality: Right;   LAPAROSCOPY N/A 07/17/2015   Procedure: LAPAROSCOPY OPERATIVE;  Surgeon: RGae Dry MD;  Location: [facility] ORS;  Service: Gynecology;  Laterality: N/A;    Family History  Problem Relation Age of Onset   Hypertension Mother     Social History   Tobacco Use   Smoking status: Never   Smokeless tobacco: Never  Vaping Use   Vaping Use: Never used  Substance Use Topics   Alcohol use: Not Currently    Comment: every six months maybe  1 drink   Drug use: No    Allergies: No Known Allergies  No medications prior to admission.    Review of Systems  Constitutional: Negative.   Respiratory: Negative.    Gastrointestinal:  Positive for abdominal pain (cramping).  Genitourinary: Negative.   Musculoskeletal: Negative.   Neurological: Negative.    Physical Exam   Blood pressure 123/75, pulse 72, temperature 98.3 F (36.8 C), temperature source Oral, resp. rate 16, height '5\' 2"'$  (1.575 m), weight 60.7 kg, last menstrual period 11/18/2021, SpO2 99 %, currently breastfeeding.  Physical Exam Vitals and nursing note reviewed.  Constitutional:      General: She is not in acute distress. Cardiovascular:     Rate and Rhythm: Normal rate.  Pulmonary:     Effort: Pulmonary effort is normal.  Abdominal:     Palpations: Abdomen is soft.     Tenderness: There is no abdominal tenderness.  Genitourinary:    Comments: Patient collected blind swabs Skin:    General: Skin is warm and dry.  Neurological:     General: No focal deficit present.     Mental Status: She is alert and oriented to person, place, and  time.  Psychiatric:        Mood and Affect: Mood normal.        Behavior: Behavior normal.    US OB LESS THAN 14 WEEKS WITH OB TRANSVAGINAL  Result Date: 12/22/2021 CLINICAL DATA:  Assess for pregnancy. Positive urine pregnancy test. EXAM: OBSTETRIC <14 WK ULTRASOUND TECHNIQUE: Transabdominal ultrasound was performed for evaluation of the gestation as well as the maternal uterus and adnexal regions. COMPARISON:  None Available. FINDINGS: Intrauterine gestational sac: None Yolk sac:  Not Visualized. Embryo:  Not Visualized. Cardiac Activity: Not Visualized. Subchorionic hemorrhage:  None visualized. Maternal uterus/adnexae: Right ovary: Corpus luteum noted. Left ovary: Normal Other :Uterus is retroverted.  Anterior fibroid measures 2.9 cm. Free fluid: Trace free fluid noted, which may be physiologic in a premenopausal  female. IMPRESSION: No intrauterine gestational sac, yolk sac, or fetal pole identified. Differential considerations include intrauterine pregnancy too early to be sonographically visualized, missed abortion, or ectopic pregnancy. Followup ultrasound is recommended in 10-14 days for further evaluation. Electronically Signed   By: Kerby Moors M.D.   On: 12/22/2021 10:45    MAU Course  Procedures  MDM Labs stable, wet prep negative, GC/CT pending. Blood type AB neg- patient not bleeding so Rhogam not indicated HCG 35. Ultrasound with results as above. No evidence of IUP or ectopic. Reviewed results with patient that this could be early normal pregnancy but I cannot rule out ectopic pregnancy or miscarriage. Patient will follow up in 48 hours at [facility] for repeat bHCG. She was given strict return precautions.   Assessment and Plan  Pregnancy of unknown anatomic location Abdominal pain affecting pregnancy  - Discharge home in stable condition - Repeat bHCG in 48hrs at [facility] - Strict return precautions. Return to MAU sooner or as needed for new/worsening symptoms   Renee Harder, CNM 12/22/2021, 11:59 AM

## 2021-12-23 LAB — GC/CHLAMYDIA PROBE AMP (~~LOC~~) NOT AT ARMC
Chlamydia: NEGATIVE
Comment: NEGATIVE
Comment: NORMAL
Neisseria Gonorrhea: NEGATIVE

## 2021-12-24 ENCOUNTER — Telehealth: Payer: Self-pay | Admitting: Licensed Clinical Social Worker

## 2021-12-24 ENCOUNTER — Telehealth: Payer: Self-pay | Admitting: *Deleted

## 2021-12-24 ENCOUNTER — Other Ambulatory Visit (INDEPENDENT_AMBULATORY_CARE_PROVIDER_SITE_OTHER): Payer: BC Managed Care – PPO

## 2021-12-24 ENCOUNTER — Other Ambulatory Visit: Payer: Self-pay | Admitting: *Deleted

## 2021-12-24 VITALS — BP 127/91 | HR 83 | Ht 62.0 in | Wt 136.5 lb

## 2021-12-24 DIAGNOSIS — R109 Unspecified abdominal pain: Secondary | ICD-10-CM

## 2021-12-24 DIAGNOSIS — O26899 Other specified pregnancy related conditions, unspecified trimester: Secondary | ICD-10-CM

## 2021-12-24 LAB — BETA HCG QUANT (REF LAB): hCG Quant: 92 m[IU]/mL

## 2021-12-24 NOTE — Telephone Encounter (Signed)
Pt called in requested 12/31/2021 visit be cancelled and advise [facility]

## 2021-12-24 NOTE — Telephone Encounter (Signed)
Pt returned TC regarding rising bHCG and need to repeat on 12/31/21.Pt verbalized understanding.

## 2021-12-24 NOTE — Progress Notes (Signed)
Patient presents for repeat BHCG. Patient denies bleeding. Reports having abdominal pain/cramping. LMP 11/18/21. Will follow-up once results are final.

## 2021-12-27 ENCOUNTER — Inpatient Hospital Stay (HOSPITAL_COMMUNITY)
Admission: AD | Admit: 2021-12-27 | Discharge: 2021-12-28 | Disposition: A | Discharge status: Home / Self Care | Payer: BC Managed Care – PPO | Attending: Obstetrics & Gynecology | Admitting: Obstetrics & Gynecology

## 2021-12-27 DIAGNOSIS — Z3687 Encounter for antenatal screening for uncertain dates: Secondary | ICD-10-CM

## 2021-12-27 DIAGNOSIS — O3680X0 Pregnancy with inconclusive fetal viability, not applicable or unspecified: Secondary | ICD-10-CM | POA: Insufficient documentation

## 2021-12-27 DIAGNOSIS — O4691 Antepartum hemorrhage, unspecified, first trimester: Secondary | ICD-10-CM | POA: Insufficient documentation

## 2021-12-27 DIAGNOSIS — Z3A01 Less than 8 weeks gestation of pregnancy: Secondary | ICD-10-CM | POA: Insufficient documentation

## 2021-12-27 DIAGNOSIS — O26859 Spotting complicating pregnancy, unspecified trimester: Secondary | ICD-10-CM

## 2021-12-28 ENCOUNTER — Inpatient Hospital Stay (HOSPITAL_COMMUNITY): Payer: BC Managed Care – PPO

## 2021-12-28 DIAGNOSIS — O26859 Spotting complicating pregnancy, unspecified trimester: Secondary | ICD-10-CM | POA: Diagnosis not present

## 2021-12-28 DIAGNOSIS — O4691 Antepartum hemorrhage, unspecified, first trimester: Secondary | ICD-10-CM | POA: Diagnosis not present

## 2021-12-28 DIAGNOSIS — Z3A01 Less than 8 weeks gestation of pregnancy: Secondary | ICD-10-CM | POA: Diagnosis not present

## 2021-12-28 DIAGNOSIS — O26891 Other specified pregnancy related conditions, first trimester: Secondary | ICD-10-CM | POA: Diagnosis not present

## 2021-12-28 DIAGNOSIS — O3680X0 Pregnancy with inconclusive fetal viability, not applicable or unspecified: Secondary | ICD-10-CM | POA: Diagnosis not present

## 2021-12-28 DIAGNOSIS — N838 Other noninflammatory disorders of ovary, fallopian tube and broad ligament: Secondary | ICD-10-CM | POA: Diagnosis not present

## 2021-12-28 DIAGNOSIS — Z3A00 Weeks of gestation of pregnancy not specified: Secondary | ICD-10-CM | POA: Diagnosis not present

## 2021-12-28 DIAGNOSIS — D259 Leiomyoma of uterus, unspecified: Secondary | ICD-10-CM | POA: Diagnosis not present

## 2021-12-28 LAB — COMPREHENSIVE METABOLIC PANEL
ALT: 10 U/L (ref 0–44)
AST: 16 U/L (ref 15–41)
Albumin: 3.8 g/dL (ref 3.5–5.0)
Alkaline Phosphatase: 46 U/L (ref 38–126)
Anion gap: 5 (ref 5–15)
BUN: 13 mg/dL (ref 6–20)
CO2: 24 mmol/L (ref 22–32)
Calcium: 9.3 mg/dL (ref 8.9–10.3)
Chloride: 108 mmol/L (ref 98–111)
Creatinine, Ser: 0.86 mg/dL (ref 0.44–1.00)
GFR, Estimated: >60 mL/min (ref >60)
Glucose, Bld: 97 mg/dL (ref 70–99)
Potassium: 3.7 mmol/L (ref 3.5–5.1)
Sodium: 137 mmol/L (ref 135–145)
Total Bilirubin: 1.3 mg/dL — ABNORMAL HIGH (ref 0.3–1.2)
Total Protein: 6.5 g/dL (ref 6.5–8.1)

## 2021-12-28 LAB — CBC
HCT: 36.7 % (ref 36.0–46.0)
Hemoglobin: 13.5 g/dL (ref 12.0–15.0)
MCH: 33.3 pg (ref 26.0–34.0)
MCHC: 36.8 g/dL — ABNORMAL HIGH (ref 30.0–36.0)
MCV: 90.4 fL (ref 80.0–100.0)
Platelets: 277 10*3/uL (ref 150–400)
RBC: 4.06 MIL/uL (ref 3.87–5.11)
RDW: 12.1 % (ref 11.5–15.5)
WBC: 7.5 10*3/uL (ref 4.0–10.5)
nRBC: 0 % (ref 0.0–0.2)

## 2021-12-28 LAB — HCG, QUANTITATIVE, PREGNANCY: hCG, Beta Chain, Quant, S: 679 m[IU]/mL — ABNORMAL HIGH (ref <5)

## 2021-12-28 NOTE — MAU Note (Signed)
Pt says at 2300- went to b-room- when she wiped - bright red blood Feels nausea-  While here - in b-room- nothing when she wiped

## 2021-12-28 NOTE — MAU Provider Note (Signed)
History     CSN: [id number]  Arrival date and time: 12/27/21 2355   Event Date/Time   First Provider Initiated Contact with Patient 12/28/21 0131      Chief Complaint  Patient presents with   Vaginal Bleeding   Kelly Lane is a 26 y.o. [id number] at 6w5dby Unsure LMP of November 18, 2021 who receives care at [facility].  She presents today for Spotting.  She states she woke around 11pm and noted some spotting, with wiping, that was red. She reports some cramping, in her pelvis, that is intermittent.  She states the cramping is "not super painful" and she does not feel like she needs anything.  Patient reports sexual activity on Wednesday that was without pain or discomfort.     OB History     Gravida  3   Para  1   Term  0   Preterm  1   AB  1   Living  1      SAB  0   IAB  1   Ectopic  0   Multiple  0   Live Births  1           Past Medical History:  Diagnosis Date   Anxiety    Endometriosis 07/2015   GERD (gastroesophageal reflux disease)    NO MEDS   Headache    Ovarian cyst 07/2015    Past Surgical History:  Procedure Laterality Date   CESAREAN SECTION N/A 09/20/2020   Procedure: CESAREAN SECTION;  Surgeon: AOsborne Oman MD;  Location: [facility];  Service: Obstetrics;  Laterality: N/A;   LAPAROSCOPIC OVARIAN CYSTECTOMY Right 07/17/2015   Procedure: EXCISION OF ENDOMETRIOSIS;  Surgeon: RGae Dry MD;  Location: [facility] ORS;  Service: Gynecology;  Laterality: Right;   LAPAROSCOPY N/A 07/17/2015   Procedure: LAPAROSCOPY OPERATIVE;  Surgeon: RGae Dry MD;  Location: [facility] ORS;  Service: Gynecology;  Laterality: N/A;    Family History  Problem Relation Age of Onset   Hypertension Mother     Social History   Tobacco Use   Smoking status: Never   Smokeless tobacco: Never  Vaping Use   Vaping Use: Never used  Substance Use Topics   Alcohol use: Not Currently    Comment: every six months maybe 1 drink   Drug use: No     Allergies: No Known Allergies  Medications Prior to Admission  Medication Sig Dispense Refill Last Dose   triamcinolone cream (KENALOG) 0.1 % Apply 1 application. topically 2 (two) times daily. To affected area till better (Patient not taking: Reported on 12/24/2021) 80 g 0     Review of Systems  HENT:  Positive for dental problem (Pain in front teeth).   Gastrointestinal:  Positive for nausea (With waking at 11pm, none currently). Negative for vomiting.  Genitourinary:  Positive for pelvic pain (Cramping) and vaginal bleeding. Negative for difficulty urinating, dysuria and vaginal discharge.  Neurological:  Negative for dizziness, light-headedness and headaches.   Physical Exam   Last menstrual period 11/18/2021, currently breastfeeding.  Physical Exam Vitals reviewed.  Constitutional:      Appearance: Normal appearance.  HENT:     Head: Normocephalic and atraumatic.  Eyes:     Conjunctiva/sclera: Conjunctivae normal.  Cardiovascular:     Rate and Rhythm: Normal rate.  Pulmonary:     Effort: Pulmonary effort is normal. No respiratory distress.  Musculoskeletal:        General: Normal range of motion.  Cervical back: Normal range of motion.  Skin:    General: Skin is warm and dry.  Neurological:     Mental Status: She is alert and oriented to person, place, and time.  Psychiatric:        Mood and Affect: Mood normal.        Behavior: Behavior normal.     MAU Course  Procedures Results for orders placed or performed during the hospital encounter of 12/27/21 (from the past 24 hour(s))  CBC     Status: Abnormal   Collection Time: 12/28/21 12:19 AM  Result Value Ref Range   WBC 7.5 4.0 - 10.5 K/uL   RBC 4.06 3.87 - 5.11 MIL/uL   Hemoglobin 13.5 12.0 - 15.0 g/dL   HCT 36.7 36.0 - 46.0 %   MCV 90.4 80.0 - 100.0 fL   MCH 33.3 26.0 - 34.0 pg   MCHC 36.8 (H) 30.0 - 36.0 g/dL   RDW 12.1 11.5 - 15.5 %   Platelets 277 150 - 400 K/uL   nRBC 0.0 0.0 - 0.2 %   Comprehensive metabolic panel     Status: Abnormal   Collection Time: 12/28/21 12:19 AM  Result Value Ref Range   Sodium 137 135 - 145 mmol/L   Potassium 3.7 3.5 - 5.1 mmol/L   Chloride 108 98 - 111 mmol/L   CO2 24 22 - 32 mmol/L   Glucose, Bld 97 70 - 99 mg/dL   BUN 13 6 - 20 mg/dL   Creatinine, Ser 0.86 0.44 - 1.00 mg/dL   Calcium 9.3 8.9 - 10.3 mg/dL   Total Protein 6.5 6.5 - 8.1 g/dL   Albumin 3.8 3.5 - 5.0 g/dL   AST 16 15 - 41 U/L   ALT 10 0 - 44 U/L   Alkaline Phosphatase 46 38 - 126 U/L   Total Bilirubin 1.3 (H) 0.3 - 1.2 mg/dL   GFR, Estimated >60 >60 mL/min   Anion gap 5 5 - 15  hCG, quantitative, pregnancy     Status: Abnormal   Collection Time: 12/28/21 12:19 AM  Result Value Ref Range   hCG, Beta Chain, Quant, S 679 (H) <5 mIU/mL   US OB Transvaginal  Result Date: 12/28/2021 CLINICAL DATA:  Pelvic cramping, pregnant, LMP 11/18/2021 EXAM: TRANSVAGINAL OB ULTRASOUND TECHNIQUE: Transvaginal ultrasound was performed for complete evaluation of the gestation as well as the maternal uterus, adnexal regions, and pelvic cul-de-sac. COMPARISON:  12/22/2021 FINDINGS: Intrauterine gestational sac: None identified Maternal uterus/adnexae: The uterus is retroverted. A tiny 2-3 mm cystic collection has developed within the uterine cavity, however, this is too small to accurately characterize as an intrauterine gestational sac. 2.9 cm fundal fibroid is again identified. Cervix is unremarkable. Small simple appearing free fluid within the pelvis surrounding the uterine fundus. The maternal ovaries are unremarkable. IMPRESSION: Pregnancy location not definitively visualized sonographically. Differential diagnosis again includes recent spontaneous abortion, IUP too early to visualize, and non-visualized ectopic pregnancy. Recommend close follow up of quantitative B-HCG levels, and follow up US in 10-14 days. Electronically Signed   By: Fidela Salisbury M.D.   On: 12/28/2021 01:03      MDM Labs: hCG, CBC, CMP Assessment and Plan  26 year old [id number] at 5.5 weeks Spotting AB Negative  -Labs and [place] ordered from triage. -Results as above. -Provider to bedside to review. -Informed that US shows no IUP yet d/t early gestation. -Patient reports spotting was bright, but now lighter. -Addressed questions regarding pregnancy symptoms. Informed that  this is not indicative of healthy or thriving pregnancy. -Further informed of need for follow up.  Scheduled for repeat hCG on Aug 29th.  Instructed to keep appt.  -Precautions given. -Instructed to call dentist about front teeth pain/discomfort.  -Informed that d/t limited amount of bleeding/spotting will not give rhogam today. But reiterated need to return if bleeding increases.  -Patient verbalizes understanding and without further questions. -Encouraged to call primary office or return to MAU if symptoms worsen or with the onset of new symptoms. -Discharged to home in stable condition.  Maryann Conners 12/28/2021, 1:31 AM

## 2021-12-31 ENCOUNTER — Encounter (HOSPITAL_COMMUNITY): Payer: Self-pay | Admitting: Obstetrics & Gynecology

## 2021-12-31 ENCOUNTER — Ambulatory Visit: Payer: BC Managed Care – PPO | Admitting: Family Medicine

## 2021-12-31 ENCOUNTER — Inpatient Hospital Stay (HOSPITAL_COMMUNITY)
Admission: AD | Admit: 2021-12-31 | Discharge: 2021-12-31 | Disposition: A | Discharge status: Home / Self Care | Payer: BC Managed Care – PPO | Attending: Obstetrics & Gynecology | Admitting: Obstetrics & Gynecology

## 2021-12-31 ENCOUNTER — Telehealth: Payer: Self-pay

## 2021-12-31 ENCOUNTER — Inpatient Hospital Stay (HOSPITAL_COMMUNITY): Payer: BC Managed Care – PPO

## 2021-12-31 ENCOUNTER — Other Ambulatory Visit: Payer: Self-pay

## 2021-12-31 ENCOUNTER — Other Ambulatory Visit: Payer: BC Managed Care – PPO

## 2021-12-31 DIAGNOSIS — O200 Threatened abortion: Secondary | ICD-10-CM | POA: Diagnosis not present

## 2021-12-31 DIAGNOSIS — O99352 Diseases of the nervous system complicating pregnancy, second trimester: Secondary | ICD-10-CM | POA: Insufficient documentation

## 2021-12-31 DIAGNOSIS — O99351 Diseases of the nervous system complicating pregnancy, first trimester: Secondary | ICD-10-CM | POA: Diagnosis not present

## 2021-12-31 DIAGNOSIS — D252 Subserosal leiomyoma of uterus: Secondary | ICD-10-CM | POA: Diagnosis not present

## 2021-12-31 DIAGNOSIS — O209 Hemorrhage in early pregnancy, unspecified: Secondary | ICD-10-CM | POA: Diagnosis not present

## 2021-12-31 DIAGNOSIS — R519 Headache, unspecified: Secondary | ICD-10-CM

## 2021-12-31 DIAGNOSIS — O3411 Maternal care for benign tumor of corpus uteri, first trimester: Secondary | ICD-10-CM | POA: Diagnosis not present

## 2021-12-31 DIAGNOSIS — G43909 Migraine, unspecified, not intractable, without status migrainosus: Secondary | ICD-10-CM | POA: Diagnosis not present

## 2021-12-31 DIAGNOSIS — Z3A01 Less than 8 weeks gestation of pregnancy: Secondary | ICD-10-CM

## 2021-12-31 DIAGNOSIS — O99611 Diseases of the digestive system complicating pregnancy, first trimester: Secondary | ICD-10-CM | POA: Diagnosis not present

## 2021-12-31 DIAGNOSIS — O26851 Spotting complicating pregnancy, first trimester: Secondary | ICD-10-CM | POA: Diagnosis not present

## 2021-12-31 DIAGNOSIS — O3680X0 Pregnancy with inconclusive fetal viability, not applicable or unspecified: Secondary | ICD-10-CM | POA: Insufficient documentation

## 2021-12-31 LAB — HCG, QUANTITATIVE, PREGNANCY: hCG, Beta Chain, Quant, S: 4303 m[IU]/mL — ABNORMAL HIGH (ref <5)

## 2021-12-31 LAB — CBC
HCT: 35 % — ABNORMAL LOW (ref 36.0–46.0)
Hemoglobin: 12.9 g/dL (ref 12.0–15.0)
MCH: 33.7 pg (ref 26.0–34.0)
MCHC: 36.9 g/dL — ABNORMAL HIGH (ref 30.0–36.0)
MCV: 91.4 fL (ref 80.0–100.0)
Platelets: 259 10*3/uL (ref 150–400)
RBC: 3.83 MIL/uL — ABNORMAL LOW (ref 3.87–5.11)
RDW: 12.1 % (ref 11.5–15.5)
WBC: 5 10*3/uL (ref 4.0–10.5)
nRBC: 0 % (ref 0.0–0.2)

## 2021-12-31 LAB — URINALYSIS, ROUTINE W REFLEX MICROSCOPIC
Bilirubin Urine: NEGATIVE
Glucose, UA: NEGATIVE mg/dL
Hgb urine dipstick: NEGATIVE
Ketones, ur: NEGATIVE mg/dL
Leukocytes,Ua: NEGATIVE
Nitrite: NEGATIVE
Protein, ur: NEGATIVE mg/dL
Specific Gravity, Urine: 1.03 (ref 1.005–1.030)
pH: 5 (ref 5.0–8.0)

## 2021-12-31 MED ORDER — ACETAMINOPHEN-CAFFEINE 500-65 MG PO TABS
2.0000 | ORAL_TABLET | Freq: Once | ORAL | Status: AC
  Administered 2021-12-31: 2 via ORAL
  Filled 2021-12-31: qty 2

## 2021-12-31 MED ORDER — RHO D IMMUNE GLOBULIN 1500 UNIT/2ML IJ SOSY
300.0000 ug | PREFILLED_SYRINGE | Freq: Once | INTRAMUSCULAR | Status: AC
  Administered 2021-12-31: 300 ug via INTRAMUSCULAR
  Filled 2021-12-31: qty 2

## 2021-12-31 NOTE — MAU Provider Note (Cosign Needed Addendum)
History     CSN: [id number]  Arrival date and time: 12/31/21 5784   Event Date/Time   First Provider Initiated Contact with Patient 12/31/21 [phone]      Chief Complaint  Patient presents with   Headache   Vaginal Bleeding   Ms. Kelly Lane is a 26 y.o. year old [id number] female at 8w1dweeks gestation by LMP who presents to MAU reporting vaginal spotting, but "feels like it is stopping" and a "bad" migraine. She has not taken any medications for her H/A, "because I can't take the medications I used to take." She reports she would normally take Advil for a migraine. She was seen in MAU on 12/28/2021 for the same complaints. [facility] but decided to come to [facility] because of the migraine. She rate the pain from the migraine a 7/10.   OB History     Gravida  3   Para  1   Term  0   Preterm  1   AB  1   Living  1      SAB  0   IAB  1   Ectopic  0   Multiple  0   Live Births  1           Past Medical History:  Diagnosis Date   Anxiety    Endometriosis 07/2015   GERD (gastroesophageal reflux disease)    NO MEDS   Headache    Ovarian cyst 07/2015    Past Surgical History:  Procedure Laterality Date   CESAREAN SECTION N/A 09/20/2020   Procedure: CESAREAN SECTION;  Surgeon: AOsborne Oman MD;  [facility]: [facility];  Service: Obstetrics;  Laterality: N/A;   LAPAROSCOPIC OVARIAN CYSTECTOMY Right 07/17/2015   Procedure: EXCISION OF ENDOMETRIOSIS;  Surgeon: RGae Dry MD;  Location: [facility] ORS;  Service: Gynecology;  Laterality: Right;   LAPAROSCOPY N/A 07/17/2015   Procedure: LAPAROSCOPY OPERATIVE;  Surgeon: RGae Dry MD;  Location: [facility] ORS;  Service: Gynecology;  Laterality: N/A;    Family History  Problem Relation Age of Onset   Hypertension Mother     Social History   Tobacco Use   Smoking status: Never   Smokeless tobacco: Never  Vaping Use   Vaping Use: Never used  Substance Use Topics    Alcohol use: Not Currently    Comment: every six months maybe 1 drink   Drug use: No    Allergies: No Known Allergies  Medications Prior to Admission  Medication Sig Dispense Refill Last Dose   triamcinolone cream (KENALOG) 0.1 % Apply 1 application. topically 2 (two) times daily. To affected area till better (Patient not taking: Reported on 12/24/2021) 80 g 0     Review of Systems  Constitutional: Negative.   HENT: Negative.    Eyes: Negative.   Respiratory: Negative.    Cardiovascular: Negative.   Gastrointestinal: Negative.   Endocrine: Negative.   Genitourinary:  Positive for vaginal bleeding (spotting).  Musculoskeletal: Negative.   Skin: Negative.   Allergic/Immunologic: Negative.   Neurological:  Positive for headaches (pain rated 7/10).  Hematological: Negative.   Psychiatric/Behavioral: Negative.     Physical Exam   Blood pressure 118/86, pulse 77, temperature 98.1 F (36.7 C), temperature source Oral, resp. rate 16, height '5\' 2"'$  (1.575 m), weight 61.2 kg, last menstrual period 11/18/2021, SpO2 99 %, currently breastfeeding.  Physical Exam Vitals and nursing note reviewed. Exam conducted with a  chaperone present.  Constitutional:      Appearance: Normal appearance. She is normal weight.  HENT:     Head: Normocephalic and atraumatic.  Cardiovascular:     Rate and Rhythm: Normal rate.  Pulmonary:     Effort: Pulmonary effort is normal.  Abdominal:     Palpations: Abdomen is soft.  Genitourinary:    General: Normal vulva.     Comments: Pelvic exam: External genitalia normal, SE: vaginal walls pink and well rugated, cervix is smooth, pink, no lesions, scant amt of tannish-brown vaginal d/c cleared out with 2 large cotton-tipped swabs, cervix visually closed Musculoskeletal:        General: Normal range of motion.  Skin:    General: Skin is warm and dry.  Neurological:     Mental Status: She is alert and oriented to person, place, and time.  Psychiatric:         Mood and Affect: Mood normal.        Behavior: Behavior normal.        Thought Content: Thought content normal.        Judgment: Judgment normal.    MAU Course  Procedures  MDM CCUA HCG Excedrin Tension H/A x 2 tabs -- resolved H/A OB <14 wks U/S Rhophylac w/u Rhophylac injection  Results for orders placed or performed during the hospital encounter of 12/31/21 (from the past 24 hour(s))  hCG, quantitative, pregnancy     Status: Abnormal   Collection Time: 12/31/21 10:05 AM  Result Value Ref Range   hCG, Beta Chain, Quant, S 4,303 (H) <5 mIU/mL  CBC     Status: Abnormal   Collection Time: 12/31/21 10:05 AM  Result Value Ref Range   WBC 5.0 4.0 - 10.5 K/uL   RBC 3.83 (L) 3.87 - 5.11 MIL/uL   Hemoglobin 12.9 12.0 - 15.0 g/dL   HCT 35.0 (L) 36.0 - 46.0 %   MCV 91.4 80.0 - 100.0 fL   MCH 33.7 26.0 - 34.0 pg   MCHC 36.9 (H) 30.0 - 36.0 g/dL   RDW 12.1 11.5 - 15.5 %   Platelets 259 150 - 400 K/uL   nRBC 0.0 0.0 - 0.2 %  Urinalysis, Routine w reflex microscopic Urine, Clean Catch     Status: Abnormal   Collection Time: 12/31/21 10:14 AM  Result Value Ref Range   Color, Urine YELLOW YELLOW   APPearance HAZY (A) CLEAR   Specific Gravity, Urine 1.030 1.005 - 1.030   pH 5.0 5.0 - 8.0   Glucose, UA NEGATIVE NEGATIVE mg/dL   Hgb urine dipstick NEGATIVE NEGATIVE   Bilirubin Urine NEGATIVE NEGATIVE   Ketones, ur NEGATIVE NEGATIVE mg/dL   Protein, ur NEGATIVE NEGATIVE mg/dL   Nitrite NEGATIVE NEGATIVE   Leukocytes,Ua NEGATIVE NEGATIVE  Rh IG workup (includes ABO/Rh)     Status: None (Preliminary result)   Collection Time: 12/31/21 11:52 AM  Result Value Ref Range   Gestational Age(Wks) 6    ABO/RH(D) AB NEG    Antibody Screen NEG    Unit Number [id number]    Blood Component Type RHIG    Unit division 00    Status of Unit ISSUED    Transfusion Status      OK TO TRANSFUSE Performed at [facility], [address], [place], [place] [address]     [place]  Transvaginal  Result Date: 12/31/2021 CLINICAL DATA:  Spotting for 1 day EXAM: OBSTETRIC <14 WK ULTRASOUND TECHNIQUE: Transabdominal ultrasound was performed for  evaluation of the gestation as well as the maternal uterus and adnexal regions. COMPARISON:  12/28/2021 FINDINGS: Intrauterine gestational sac: Single Yolk sac:  Not Visualized. Embryo:  Not Visualized. Cardiac Activity: Not Visualized. Heart Rate: Not visualized. MSD:  6.04 mm   5 w   2 d Subchorionic hemorrhage:  None visualized. Maternal uterus/adnexae: Incidental subserosal fibroid of the posterior uterine fundus measuring 2.5 cm. IMPRESSION: Single intrauterine gestation at sonographic gestational age of [redacted] weeks, 2 days. Pregnancy has progressed to a visible intrauterine gestational sac, however no fetal pole or fetal cardiac activity identified at this time. Early pregnancy of uncertain viability. Recommend serial beta hCG and follow-up ultrasound in 7-14 days to assess for continued development and viability. Electronically Signed   By: Delanna Ahmadi M.D.   On: 12/31/2021 15:57     Assessment and Plan  1. Vaginal bleeding in pregnancy, first trimester  - Information provided on VB in 1st trimester and threatened miscarriage - Return to MAU: If you have heavier bleeding that soaks through more that 2 pads per hour for an hour or more If you bleed so much that you feel like you might pass out or you do pass out If you have significant abdominal pain that is not improved with Tylenol 1000 mg every 8 hours as needed for pain If you develop a fever > 100.5  2. Headache in pregnancy, antepartum, first trimester  - Information provided on migraine headache and form to record H/As   3. Pregnancy with uncertain fetal viability, single or unspecified fetus  - US OB LESS THAN 14 WEEKS WITH OB TRANSVAGINAL in 2 weeks  4. [redacted] weeks gestation of pregnancy  - Discharge patient - Message sent to [facility] to get U/S scheduled - Patient  verbalized an understanding of the plan of care and agrees.   Laury Deep, CNM 12/31/2021, 9:55 AM

## 2021-12-31 NOTE — Discharge Instructions (Addendum)
Return to MAU: If you have heavier bleeding that soaks through more that 2 pads per hour for an hour or more If you bleed so much that you feel like you might pass out or you do pass out If you have significant abdominal pain that is not improved with Tylenol 1000 mg every 8 hours as needed for pain If you develop a fever > 100.5  

## 2021-12-31 NOTE — MAU Note (Signed)
Kelly Lane is a 26 y.o. at 26w1dhere in MAU reporting: was seen in MAU on 8/26 and was advised to follow up today for repeat hcg, was planning to go to the office. Has been having spotting, feels like it is stopping. Has a bad migraine.  Onset of complaint: 4 AM this morning  Pain score: 7/10  Vitals:   12/31/21 0913  BP: 116/86  Pulse: 67  Resp: 16  Temp: 98.1 F (36.7 C)  SpO2: 99%     Lab orders placed from triage: UA

## 2021-12-31 NOTE — Telephone Encounter (Signed)
Returned call, no answer, left vm 

## 2022-01-01 LAB — RH IG WORKUP (INCLUDES ABO/RH)
ABO/RH(D): AB NEG
Antibody Screen: NEGATIVE
Gestational Age(Wks): 6
Unit division: 0

## 2022-01-02 ENCOUNTER — Encounter: Payer: Self-pay | Admitting: Obstetrics

## 2022-01-14 ENCOUNTER — Other Ambulatory Visit: Payer: BC Managed Care – PPO

## 2022-01-14 ENCOUNTER — Ambulatory Visit
Admission: RE | Admit: 2022-01-14 | Discharge: 2022-01-14 | Disposition: A | Discharge status: Home / Self Care | Payer: BC Managed Care – PPO | Source: Ambulatory Visit | Attending: Obstetrics and Gynecology | Admitting: Obstetrics and Gynecology

## 2022-01-14 ENCOUNTER — Ambulatory Visit (INDEPENDENT_AMBULATORY_CARE_PROVIDER_SITE_OTHER): Payer: BC Managed Care – PPO

## 2022-01-14 VITALS — BP 124/84 | HR 78

## 2022-01-14 DIAGNOSIS — Z3A08 8 weeks gestation of pregnancy: Secondary | ICD-10-CM | POA: Diagnosis not present

## 2022-01-14 DIAGNOSIS — O3680X0 Pregnancy with inconclusive fetal viability, not applicable or unspecified: Secondary | ICD-10-CM | POA: Insufficient documentation

## 2022-01-14 DIAGNOSIS — D251 Intramural leiomyoma of uterus: Secondary | ICD-10-CM | POA: Diagnosis not present

## 2022-01-14 DIAGNOSIS — O219 Vomiting of pregnancy, unspecified: Secondary | ICD-10-CM

## 2022-01-14 MED ORDER — PROMETHAZINE HCL 25 MG PO TABS: 25.0000 mg | ORAL_TABLET | Freq: Four times a day (QID) | ORAL | 0 refills | Status: DC | PRN

## 2022-01-14 NOTE — Progress Notes (Signed)
Ultrasound Results Visit   Pt here today following viability [place] for results. Results reviewed with Mickie Kay, MD who finds single living IUP and reccomends patient begin prenatal care. Provider also notes small fibroid in the anterior uterine fundus. Reviewed dating and ultrasound with provider.   Reviewed dating and provider recommendation with patient:   EDD: 08/31/2022 7w 2d today  Reviewed medications and allergies with patient; list of medications safe to take during pregnancy given. Pt states she has morning sickness. Per standard of work Phenergan sent to pharmacy on file. Recommended pt begin prenatal vitamin and schedule prenatal care.  Darlyne Russian, RN 01/14/2022  4:29 PM

## 2022-01-19 ENCOUNTER — Inpatient Hospital Stay (HOSPITAL_COMMUNITY)
Admission: AD | Admit: 2022-01-19 | Discharge: 2022-01-19 | Disposition: A | Discharge status: Home / Self Care | Payer: BC Managed Care – PPO | Attending: Family Medicine | Admitting: Family Medicine

## 2022-01-19 ENCOUNTER — Other Ambulatory Visit: Payer: Self-pay

## 2022-01-19 DIAGNOSIS — O210 Mild hyperemesis gravidarum: Secondary | ICD-10-CM | POA: Insufficient documentation

## 2022-01-19 DIAGNOSIS — O209 Hemorrhage in early pregnancy, unspecified: Secondary | ICD-10-CM | POA: Diagnosis not present

## 2022-01-19 DIAGNOSIS — Z3A08 8 weeks gestation of pregnancy: Secondary | ICD-10-CM | POA: Diagnosis not present

## 2022-01-19 DIAGNOSIS — O219 Vomiting of pregnancy, unspecified: Secondary | ICD-10-CM

## 2022-01-19 DIAGNOSIS — O26891 Other specified pregnancy related conditions, first trimester: Secondary | ICD-10-CM | POA: Insufficient documentation

## 2022-01-19 DIAGNOSIS — O26851 Spotting complicating pregnancy, first trimester: Secondary | ICD-10-CM | POA: Diagnosis not present

## 2022-01-19 LAB — URINALYSIS, ROUTINE W REFLEX MICROSCOPIC
Bacteria, UA: NONE SEEN
Bilirubin Urine: NEGATIVE
Glucose, UA: NEGATIVE mg/dL
Ketones, ur: NEGATIVE mg/dL
Leukocytes,Ua: NEGATIVE
Nitrite: NEGATIVE
Protein, ur: NEGATIVE mg/dL
Specific Gravity, Urine: 1.025 (ref 1.005–1.030)
pH: 6 (ref 5.0–8.0)

## 2022-01-19 LAB — WET PREP, GENITAL
Clue Cells Wet Prep HPF POC: NONE SEEN
Sperm: NONE SEEN
Trich, Wet Prep: NONE SEEN
WBC, Wet Prep HPF POC: >=10 — AB (ref <10)
Yeast Wet Prep HPF POC: NONE SEEN

## 2022-01-19 MED ORDER — ONDANSETRON 8 MG PO TBDP: 8.0000 mg | ORAL_TABLET | Freq: Three times a day (TID) | ORAL | 2 refills | Status: DC | PRN

## 2022-01-19 MED ORDER — ONDANSETRON 4 MG PO TBDP
8.0000 mg | ORAL_TABLET | Freq: Once | ORAL | Status: AC
  Administered 2022-01-19: 8 mg via ORAL
  Filled 2022-01-19: qty 2

## 2022-01-19 MED ORDER — PROMETHAZINE HCL 25 MG PO TABS: 25.0000 mg | ORAL_TABLET | Freq: Four times a day (QID) | ORAL | 3 refills | Status: DC | PRN

## 2022-01-19 NOTE — MAU Note (Signed)
Kelly Lane is a 26 y.o. at 2w0dhere in MAU reporting: woke up this AM with some brown discharge and has continued to see it throughout the day. Also having some cramping. And is feeling weak and cannot keep anything down. Has not picked up phenergan rx because she has not been feeling well.  Onset of complaint: today  Pain score: 5/10  Vitals:   01/19/22 1552  BP: 119/80  Pulse: 72  Resp: 16  Temp: 98.1 F (36.7 C)  SpO2: 100%     Lab orders placed from triage: ua

## 2022-01-19 NOTE — MAU Provider Note (Signed)
Chief Complaint:  Abdominal Pain, Vaginal Bleeding, and Nausea   Event Date/Time   First Provider Initiated Contact with Patient 01/19/22 1738     HPI: Kelly Lane is a 26 y.o. [id number] at 49w0dwho presents to maternity admissions reporting brown vaginal discharge and worsening of nausea and vomiting. Feels weak from poor PO intake. Has Rx Phenergan but hasn't picked it up yet. Has [place] showed live [place] Received Rhophylac.   Associated signs and symptoms: Neg for fever, passage of clots or tissue. [facility]   AB NEG   Past Medical History:  Diagnosis Date  . Anxiety   . Endometriosis 07/2015  . GERD (gastroesophageal reflux disease)    NO MEDS  . Headache   . Ovarian cyst 07/2015   OB History  Gravida Para Term Preterm AB Living  3 1 0 '1 1 1  '$ SAB IAB Ectopic Multiple Live Births  0 1 0 0 1    # Outcome Date GA Lbr Len/2nd Weight Sex Delivery Anes PTL Lv  3 Current           2 Preterm 09/20/20 [id number] g M CS-LTranv Spinal  LIV  1 IAB 01/19/18 9w4w0d       Past Surgical History:  Procedure Laterality Date  . CESAREAN SECTION N/A 09/20/2020   Procedure: CESAREAN SECTION;  Surgeon: AnyOsborne OmanD;  Location: [facility] ORS;  Service: Obstetrics;  Laterality: N/A;  . LAPAROSCOPIC OVARIAN CYSTECTOMY Right 07/17/2015   Procedure: EXCISION OF ENDOMETRIOSIS;  Surgeon: RobGae DryD;  Location: [facility] ORS;  Service: Gynecology;  Laterality: Right;  . LAPAROSCOPY N/A 07/17/2015   Procedure: LAPAROSCOPY OPERATIVE;  Surgeon: RobGae DryD;  Location: [facility] ORS;  Service: Gynecology;  Laterality: N/A;   Family History  Problem Relation Age of Onset  . Hypertension Mother    Social History   Tobacco Use  . Smoking status: Never  . Smokeless tobacco: Never  Vaping Use  . Vaping Use: Never used  Substance Use Topics  . Alcohol use: Not Currently    Comment: every six months maybe 1 drink  . Drug use: No    No Known Allergies Medications Prior to Admission  Medication Sig Dispense Refill Last Dose  . promethazine (PHENERGAN) 25 MG tablet Take 1 tablet (25 mg total) by mouth every 6 (six) hours as needed for nausea or vomiting. 30 tablet 0     I have reviewed patient's Past Medical Hx, Surgical Hx, Family Hx, Social Hx, medications and allergies.   ROS:  Review of Systems  Constitutional:  Negative for fever.  Gastrointestinal:  Positive for nausea and vomiting. Negative for abdominal pain and diarrhea.  Genitourinary:  Positive for vaginal bleeding (brown spotting).  Neurological:  Positive for weakness.    Physical Exam  Patient Vitals for the past 24 hrs:  BP Temp Temp src Pulse Resp SpO2 Height Weight  01/19/22 1552 119/80 98.1 F (36.7 C) Oral 72 16 100 % -- --  01/19/22 1549 -- -- -- -- -- -- '5\' 2"'$  (1.575 m) 61.1 kg   Constitutional: Well-developed, well-nourished female in no acute distress. Vomiting.  Cardiovascular: normal rate Respiratory: normal effort GI: Abd soft, non-tender, gravid appropriate for gestational age. Pos BS x 4 MS: Extremities nontender, no edema, normal ROM Neurologic: Alert and oriented x 4.  GU: Neg CVAT.  Pelvic: NEFG, physiologic discharge, no blood, cervix  clean. No CMT     FHT:  167 by informal BS [place]   Labs: Results for orders placed or performed during the hospital encounter of 01/19/22 (from the past 24 hour(s))  Urinalysis, Routine w reflex microscopic Urine, Clean Catch     Status: Abnormal   Collection Time: 01/19/22  4:23 PM  Result Value Ref Range   Color, Urine YELLOW YELLOW   APPearance CLEAR CLEAR   Specific Gravity, Urine 1.025 1.005 - 1.030   pH 6.0 5.0 - 8.0   Glucose, UA NEGATIVE NEGATIVE mg/dL   Hgb urine dipstick MODERATE (A) NEGATIVE   Bilirubin Urine NEGATIVE NEGATIVE   Ketones, ur NEGATIVE NEGATIVE mg/dL   Protein, ur NEGATIVE NEGATIVE mg/dL   Nitrite NEGATIVE NEGATIVE   Leukocytes,Ua NEGATIVE NEGATIVE   RBC /  HPF 0-5 0 - 5 RBC/hpf   WBC, UA 0-5 0 - 5 WBC/hpf   Bacteria, UA NONE SEEN NONE SEEN   Squamous Epithelial / LPF 6-10 0 - 5   Mucus PRESENT   Wet prep, genital     Status: Abnormal   Collection Time: 01/19/22  4:30 PM  Result Value Ref Range   Yeast Wet Prep HPF POC NONE SEEN NONE SEEN   Trich, Wet Prep NONE SEEN NONE SEEN   Clue Cells Wet Prep HPF POC NONE SEEN NONE SEEN   WBC, Wet Prep HPF POC >=10 (A) <10   Sperm NONE SEEN     Imaging:  US OB LESS THAN 14 WEEKS WITH OB TRANSVAGINAL  Result Date: 01/14/2022 CLINICAL DATA:  Viability. Estimated gestational age of [redacted] weeks, 1 day by LMP. EXAM: OBSTETRIC <14 WK [place] AND TRANSVAGINAL OB US TECHNIQUE: Both transabdominal and transvaginal ultrasound examinations were performed for complete evaluation of the gestation as well as the maternal uterus, adnexal regions, and pelvic cul-de-sac. Transvaginal technique was performed to assess early pregnancy. COMPARISON:  OB ultrasound dated December 31, 2021. FINDINGS: Intrauterine gestational sac: Single. Yolk sac:  Visualized. Embryo:  Visualized. Cardiac Activity: Visualized. Heart Rate: 150  bpm CRL:  11.5 mm   7 w   2 d                  [place] EDC: 08/31/2022 Subchorionic hemorrhage:  None visualized. Maternal uterus/adnexae: 2.5 x 2.1 x 1.9 cm intramural fibroid in the anterior uterine fundus. The ovaries are unremarkable. IMPRESSION: 1. Single live intrauterine pregnancy with estimated gestational age of [redacted] weeks, 2 days. Electronically Signed   By: Titus Dubin M.D.   On: 01/14/2022 16:20   US OB Transvaginal  Result Date: 12/31/2021 CLINICAL DATA:  Spotting for 1 day EXAM: OBSTETRIC <14 WK ULTRASOUND TECHNIQUE: Transabdominal ultrasound was performed for evaluation of the gestation as well as the maternal uterus and adnexal regions. COMPARISON:  12/28/2021 FINDINGS: Intrauterine gestational sac: Single Yolk sac:  Not Visualized. Embryo:  Not Visualized. Cardiac Activity: Not Visualized. Heart Rate: Not  visualized. MSD:  6.04 mm   5 w   2 d Subchorionic hemorrhage:  None visualized. Maternal uterus/adnexae: Incidental subserosal fibroid of the posterior uterine fundus measuring 2.5 cm. IMPRESSION: Single intrauterine gestation at sonographic gestational age of [redacted] weeks, 2 days. Pregnancy has progressed to a visible intrauterine gestational sac, however no fetal pole or fetal cardiac activity identified at this time. Early pregnancy of uncertain viability. Recommend serial beta hCG and follow-up ultrasound in 7-14 days to assess for continued development and viability. Electronically Signed   By: Delanna Ahmadi M.D.   On:  12/31/2021 15:57   US OB Transvaginal  Result Date: 12/28/2021 CLINICAL DATA:  Pelvic cramping, pregnant, LMP 11/18/2021 EXAM: TRANSVAGINAL OB ULTRASOUND TECHNIQUE: Transvaginal ultrasound was performed for complete evaluation of the gestation as well as the maternal uterus, adnexal regions, and pelvic cul-de-sac. COMPARISON:  12/22/2021 FINDINGS: Intrauterine gestational sac: None identified Maternal uterus/adnexae: The uterus is retroverted. A tiny 2-3 mm cystic collection has developed within the uterine cavity, however, this is too small to accurately characterize as an intrauterine gestational sac. 2.9 cm fundal fibroid is again identified. Cervix is unremarkable. Small simple appearing free fluid within the pelvis surrounding the uterine fundus. The maternal ovaries are unremarkable. IMPRESSION: Pregnancy location not definitively visualized sonographically. Differential diagnosis again includes recent spontaneous abortion, IUP too early to visualize, and non-visualized ectopic pregnancy. Recommend close follow up of quantitative B-HCG levels, and follow up US in 10-14 days. Electronically Signed   By: Fidela Salisbury M.D.   On: 12/28/2021 01:03   US OB LESS THAN 14 WEEKS WITH OB TRANSVAGINAL  Result Date: 12/22/2021 CLINICAL DATA:  Assess for pregnancy. Positive urine pregnancy test.  EXAM: OBSTETRIC <14 WK ULTRASOUND TECHNIQUE: Transabdominal ultrasound was performed for evaluation of the gestation as well as the maternal uterus and adnexal regions. COMPARISON:  None Available. FINDINGS: Intrauterine gestational sac: None Yolk sac:  Not Visualized. Embryo:  Not Visualized. Cardiac Activity: Not Visualized. Subchorionic hemorrhage:  None visualized. Maternal uterus/adnexae: Right ovary: Corpus luteum noted. Left ovary: Normal Other :Uterus is retroverted.  Anterior fibroid measures 2.9 cm. Free fluid: Trace free fluid noted, which may be physiologic in a premenopausal female. IMPRESSION: No intrauterine gestational sac, yolk sac, or fetal pole identified. Differential considerations include intrauterine pregnancy too early to be sonographically visualized, missed abortion, or ectopic pregnancy. Followup ultrasound is recommended in 10-14 days for further evaluation. Electronically Signed   By: Kerby Moors M.D.   On: 12/22/2021 10:45    MAU Course: Orders Placed This Encounter  Procedures  . Wet prep, genital  . Urinalysis, Routine w reflex microscopic Urine, Clean Catch  . Lab instructions   Meds ordered this encounter  Medications  . ondansetron (ZOFRAN-ODT) disintegrating tablet 8 mg    MDM:  Assessment: No diagnosis found.  Plan: Discharge home in stable condition.  *** Labor precautions and fetal kick counts   Allergies as of 01/19/2022   No Known Allergies   Med Rec must be completed prior to using this [facility], [place], [place] 01/19/2022 5:51 PM

## 2022-01-20 LAB — GC/CHLAMYDIA PROBE AMP (~~LOC~~) NOT AT ARMC
Chlamydia: NEGATIVE
Comment: NEGATIVE
Comment: NORMAL
Neisseria Gonorrhea: NEGATIVE

## 2022-03-12 ENCOUNTER — Ambulatory Visit (INDEPENDENT_AMBULATORY_CARE_PROVIDER_SITE_OTHER): Payer: BC Managed Care – PPO

## 2022-03-12 DIAGNOSIS — Z3480 Encounter for supervision of other normal pregnancy, unspecified trimester: Secondary | ICD-10-CM | POA: Diagnosis not present

## 2022-03-12 MED ORDER — BLOOD PRESSURE KIT DEVI: 1.0000 | 0 refills | Status: DC

## 2022-03-12 MED ORDER — GOJJI WEIGHT SCALE MISC: 1.0000 | 0 refills | Status: DC

## 2022-03-12 NOTE — Progress Notes (Signed)
New OB Intake  I connected with Kelly Lane on 03/12/22 at  2:10 PM EST by telephone and verified that I am speaking with the correct person using two identifiers. Nurse is located at [facility] and pt is located at [place].  I discussed the limitations, risks, security and privacy concerns of performing an evaluation and management service by telephone and the availability of in person appointments. I also discussed with the patient that there may be a patient responsible charge related to this service. The patient expressed understanding and agreed to proceed.  I explained I am completing New OB Intake today. We discussed EDD of 08/31/22 that is based on early first trimester u/s. Pt is G3/P0111. I reviewed her allergies, medications, Medical/Surgical/OB history, and appropriate screenings. I informed her of [facility] services. [facility] information placed in AVS. Based on history, this is a low risk pregnancy.  Patient Active Problem List   Diagnosis Date Noted   S/P emergency cesarean section for NRFHT 09/20/2020   Severe preeclampsia, delivered 08/29/2020   Rh negative state in antepartum period 06/07/2020   Supervision of high-risk pregnancy 05/24/2020    Concerns addressed today  Delivery Plans Plans to deliver at [facility]. Patient given information for [facility] Healthy Baby website for more information about [facility] and [place]. Patient is not interested in water birth. Offered upcoming OB visit with CNM to discuss further.  MyChart/Babyscripts MyChart access verified. I explained pt will have some visits in office and some virtually. Babyscripts instructions given and order placed. Patient verifies receipt of registration text/e-mail. Account successfully created and app downloaded.  Blood Pressure Cuff/Weight Scale Blood pressure cuff ordered for patient to pick-up from [employer]. Explained after first prenatal appt pt will check weekly and document in 58. Patient does not  have weight scale; order sent to [place], patient may track weight weekly in Babyscripts.  Anatomy US Explained first scheduled [place] will be around 19 weeks. Anatomy US ordered. Date TBD.   Labs Discussed [name] genetic screening with patient. Would like both Panorama and Horizon drawn at new OB visit. Routine prenatal labs needed.  COVID Vaccine Patient Not a Candidate had COVID vaccine.   Is patient a CenteringPregnancy candidate?  Declined Declined due to Group setting Not a candidate due to  If accepted,   Social Determinants of Health Food Insecurity: Patient denies food insecurity. WIC Referral: Patient is interested in referral to [facility].  Transportation: Patient expressed transportation needs. Childcare: Discussed no children allowed at ultrasound appointments. Offered childcare services; patient expresses need for childcare services. Childcare scheduled for appropriate appointments and information given to patient.  First visit review I reviewed new OB appt with patient. I explained they will have a provider visit that includes pap smear, genetic screening, prenatal labs, std screening, and discuss plan of care for pregnancy. Explained pt will be seen by Baltazar Najjar at first visit; encounter routed to appropriate provider. Explained that patient will be seen by pregnancy navigator following visit with provider.   Lucianne Lei, RN 03/12/2022  2:30 PM

## 2022-03-12 NOTE — Progress Notes (Signed)
Patient to come in for lab only appt for prenatal labs prior to scheduled New OB visit.

## 2022-03-17 ENCOUNTER — Other Ambulatory Visit: Payer: BC Managed Care – PPO

## 2022-03-17 VITALS — Wt 139.0 lb

## 2022-03-17 DIAGNOSIS — Z3480 Encounter for supervision of other normal pregnancy, unspecified trimester: Secondary | ICD-10-CM

## 2022-03-17 DIAGNOSIS — Z3482 Encounter for supervision of other normal pregnancy, second trimester: Secondary | ICD-10-CM | POA: Diagnosis not present

## 2022-03-19 ENCOUNTER — Encounter: Payer: Self-pay | Admitting: Obstetrics

## 2022-03-19 LAB — CBC/D/PLT+RPR+RH+ABO+RUBIGG...
Antibody Screen: NEGATIVE
Basophils Absolute: 0 10*3/uL (ref 0.0–0.2)
Basos: 0 %
EOS (ABSOLUTE): 0.1 10*3/uL (ref 0.0–0.4)
Eos: 1 %
HCV Ab: NONREACTIVE
HIV Screen 4th Generation wRfx: NONREACTIVE
Hematocrit: 36.3 % (ref 34.0–46.6)
Hemoglobin: 13 g/dL (ref 11.1–15.9)
Hepatitis B Surface Ag: NEGATIVE
Immature Grans (Abs): 0.1 10*3/uL (ref 0.0–0.1)
Immature Granulocytes: 1 %
Lymphocytes Absolute: 1.9 10*3/uL (ref 0.7–3.1)
Lymphs: 22 %
MCH: 34.3 pg — ABNORMAL HIGH (ref 26.6–33.0)
MCHC: 35.8 g/dL — ABNORMAL HIGH (ref 31.5–35.7)
MCV: 96 fL (ref 79–97)
Monocytes Absolute: 0.5 10*3/uL (ref 0.1–0.9)
Monocytes: 6 %
Neutrophils Absolute: 6.1 10*3/uL (ref 1.4–7.0)
Neutrophils: 70 %
Platelets: 266 10*3/uL (ref 150–450)
RBC: 3.79 x10E6/uL (ref 3.77–5.28)
RDW: 11.6 % — ABNORMAL LOW (ref 11.7–15.4)
RPR Ser Ql: NONREACTIVE
Rh Factor: NEGATIVE
Rubella Antibodies, IGG: 9.51 index (ref >0.99)
WBC: 8.8 10*3/uL (ref 3.4–10.8)

## 2022-03-19 LAB — HCV INTERPRETATION

## 2022-03-19 LAB — AFP, SERUM, OPEN SPINA BIFIDA
AFP MoM: 1.42
AFP Value: 55.7 ng/mL
Gest. Age on Collection Date: 16 weeks
Maternal Age At EDD: 27.7 yr
OSBR Risk 1 IN: 6808
Test Results:: NEGATIVE
Weight: 139 [lb_av]

## 2022-03-19 LAB — URINE CULTURE, OB REFLEX

## 2022-03-19 LAB — CULTURE, OB URINE

## 2022-03-21 LAB — PANORAMA PRENATAL TEST FULL PANEL:PANORAMA TEST PLUS 5 ADDITIONAL MICRODELETIONS: FETAL FRACTION: 9.9

## 2022-03-24 ENCOUNTER — Encounter: Payer: Self-pay | Admitting: Obstetrics and Gynecology

## 2022-03-24 DIAGNOSIS — O09899 Supervision of other high risk pregnancies, unspecified trimester: Secondary | ICD-10-CM | POA: Insufficient documentation

## 2022-03-24 LAB — HORIZON CUSTOM

## 2022-04-09 ENCOUNTER — Inpatient Hospital Stay (HOSPITAL_COMMUNITY)
Admission: AD | Admit: 2022-04-09 | Discharge: 2022-04-09 | Disposition: A | Discharge status: Home / Self Care | Payer: BC Managed Care – PPO | Attending: Obstetrics & Gynecology | Admitting: Obstetrics & Gynecology

## 2022-04-09 DIAGNOSIS — M545 Low back pain, unspecified: Secondary | ICD-10-CM

## 2022-04-09 DIAGNOSIS — O368120 Decreased fetal movements, second trimester, not applicable or unspecified: Secondary | ICD-10-CM | POA: Diagnosis not present

## 2022-04-09 DIAGNOSIS — Z3A19 19 weeks gestation of pregnancy: Secondary | ICD-10-CM | POA: Diagnosis not present

## 2022-04-09 DIAGNOSIS — O26892 Other specified pregnancy related conditions, second trimester: Secondary | ICD-10-CM

## 2022-04-09 DIAGNOSIS — R109 Unspecified abdominal pain: Secondary | ICD-10-CM | POA: Diagnosis not present

## 2022-04-09 DIAGNOSIS — O26899 Other specified pregnancy related conditions, unspecified trimester: Secondary | ICD-10-CM

## 2022-04-09 LAB — WET PREP, GENITAL
Clue Cells Wet Prep HPF POC: NONE SEEN
Sperm: NONE SEEN
Trich, Wet Prep: NONE SEEN
WBC, Wet Prep HPF POC: <10 (ref <10)
Yeast Wet Prep HPF POC: NONE SEEN

## 2022-04-09 LAB — URINALYSIS, ROUTINE W REFLEX MICROSCOPIC
Bilirubin Urine: NEGATIVE
Glucose, UA: NEGATIVE mg/dL
Hgb urine dipstick: NEGATIVE
Ketones, ur: NEGATIVE mg/dL
Leukocytes,Ua: NEGATIVE
Nitrite: NEGATIVE
Protein, ur: NEGATIVE mg/dL
Specific Gravity, Urine: 1.014 (ref 1.005–1.030)
pH: 7 (ref 5.0–8.0)

## 2022-04-09 NOTE — MAU Provider Note (Signed)
History     CSN: [id number]  Arrival date and time: 04/09/22 1018   Event Date/Time   First Provider Initiated Contact with Patient 04/09/22 1115      Chief Complaint  Patient presents with   Abdominal Pain   Back Pain   Decreased Fetal Movement   HPI Kelly Lane is a 26 y.o. G3P011 at 6w3dwho has not yet established care.  She presents today for Abdominal Pain, Back Pain, and Decreased Fetal Movement Patient reports back pain that she describes as a tightening that causes issues with walking and is worse with laying in certain positions especially on her back. However, she states that she denies pain currently.  She also reports some abdominal cramping that is intermittent and describes as "stretching" or like a "period."  However, she also reports that she has no current cramping. She denies vaginal or urinary issues.  She reports sexual activity in the past week, but denies pain or discomfort.    Chart reviewed and [facility]  Patient does confirm that she has an appt.   OB History     Gravida  3   Para  1   Term  0   Preterm  1   AB  1   Living  1      SAB  0   IAB  1   Ectopic  0   Multiple  0   Live Births  1           Past Medical History:  Diagnosis Date   Anxiety    Endometriosis 07/2015   GERD (gastroesophageal reflux disease)    NO MEDS   Headache    Ovarian cyst 07/2015    Past Surgical History:  Procedure Laterality Date   CESAREAN SECTION N/A 09/20/2020   Procedure: CESAREAN SECTION;  Surgeon: AOsborne Oman MD;  Location: [facility];  Service: Obstetrics;  Laterality: N/A;   LAPAROSCOPIC OVARIAN CYSTECTOMY Right 07/17/2015   Procedure: EXCISION OF ENDOMETRIOSIS;  Surgeon: RGae Dry MD;  Location: [facility] ORS;  Service: Gynecology;  Laterality: Right;   LAPAROSCOPY N/A 07/17/2015   Procedure: LAPAROSCOPY OPERATIVE;  Surgeon: RGae Dry MD;  Location: [facility] ORS;  Service:  Gynecology;  Laterality: N/A;    Family History  Problem Relation Age of Onset   Hypertension Mother     Social History   Tobacco Use   Smoking status: Never   Smokeless tobacco: Never  Vaping Use   Vaping Use: Never used  Substance Use Topics   Alcohol use: Not Currently    Comment: every six months maybe 1 drink   Drug use: No    Allergies: No Known Allergies  Medications Prior to Admission  Medication Sig Dispense Refill Last Dose   Blood Pressure Monitoring (BLOOD PRESSURE KIT) DEVI 1 kit by Does not apply route once a week. 1 each 0    Misc. Devices (GOJJI WEIGHT SCALE) MISC 1 Device by Does not apply route every 30 (thirty) days. 1 each 0    ondansetron (ZOFRAN-ODT) 8 MG disintegrating tablet Take 1 tablet (8 mg total) by mouth every 8 (eight) hours as needed for nausea or vomiting. 20 tablet 2    Prenatal MV & Min w/FA-DHA (PRENATAL GUMMIES PO) Take 1 tablet by mouth daily.      promethazine (PHENERGAN) 25 MG tablet Take 1 tablet (25 mg total) by mouth every 6 (six) hours as needed for  nausea or vomiting. Make take orally, vaginally or rectally 30 tablet 3     Review of Systems  Constitutional:  Negative for chills and fever.  Gastrointestinal:  Positive for abdominal pain. Negative for nausea and vomiting.  Genitourinary:  Negative for difficulty urinating, dysuria, vaginal bleeding and vaginal discharge.  Musculoskeletal:  Positive for back pain.   Physical Exam   Blood pressure 120/78, pulse 95, temperature 98.3 F (36.8 C), resp. rate 16, height _0  (1.575 m), weight 63.6 kg, last menstrual period 11/18/2021, SpO2 100 %, currently breastfeeding.  Physical Exam Vitals reviewed.  Constitutional:      Appearance: Normal appearance. She is well-developed.  HENT:     Head: Normocephalic and atraumatic.  Eyes:     Conjunctiva/sclera: Conjunctivae normal.  Cardiovascular:     Rate and Rhythm: Normal rate.  Pulmonary:     Effort: Pulmonary effort is normal.  No respiratory distress.  Musculoskeletal:        General: Normal range of motion.     Cervical back: Normal range of motion.  Skin:    General: Skin is warm and dry.  Neurological:     Mental Status: She is alert and oriented to person, place, and time.  Psychiatric:        Mood and Affect: Mood normal.        Behavior: Behavior normal.    MAU Course  Procedures Results for orders placed or performed during the hospital encounter of 04/09/22 (from the past 24 hour(s))  Urinalysis, Routine w reflex microscopic     Status: Abnormal   Collection Time: 04/09/22 10:59 AM  Result Value Ref Range   Color, Urine YELLOW YELLOW   APPearance HAZY (A) CLEAR   Specific Gravity, Urine 1.014 1.005 - 1.030   pH 7.0 5.0 - 8.0   Glucose, UA NEGATIVE NEGATIVE mg/dL   Hgb urine dipstick NEGATIVE NEGATIVE   Bilirubin Urine NEGATIVE NEGATIVE   Ketones, ur NEGATIVE NEGATIVE mg/dL   Protein, ur NEGATIVE NEGATIVE mg/dL   Nitrite NEGATIVE NEGATIVE   Leukocytes,Ua NEGATIVE NEGATIVE  Wet prep, genital     Status: None   Collection Time: 04/09/22 12:00 PM   Specimen: Vaginal  Result Value Ref Range   Yeast Wet Prep HPF POC NONE SEEN NONE SEEN   Trich, Wet Prep NONE SEEN NONE SEEN   Clue Cells Wet Prep HPF POC NONE SEEN NONE SEEN   WBC, Wet Prep HPF POC <10 <10   Sperm NONE SEEN     Patient informed that the ultrasound is considered a limited OB ultrasound and is not intended to be a complete ultrasound exam.  Patient also informed that the ultrasound is not being completed with the intent of assessing for fetal or placental anomalies or any pelvic abnormalities.  Explained that the purpose of today's ultrasound is to assess for   reassurance and viability.  Patient acknowledges the purpose of the exam and the limitations of the study.     MDM Exams Labs: UA, Wet Prep BSUS Assessment and Plan  26 year old, [id number] SIUP at 19.3 weeks DFM Abdominal Pain-None Currently Back Pain-None  Currently  -POC Reviewed. -Patient to self collect swabs. -BSUS performed and revealed SIUP showing BPD c/w dates. FHR 146 with fetal movement appreciated.  -Patient offered and declines pain medication. -Will await results.  Maryann Conners 04/09/2022, 11:15 AM   Reassessment (12:23 PM)  -WP and UA returns without significant findings. -Encouraged to return to MAU if symptoms worsen  or with the onset of new symptoms. -Discharged to home in stable condition.  Maryann Conners MSN, CNM Advanced Practice Provider, Center for [employer]

## 2022-04-09 NOTE — MAU Note (Signed)
Kelly Lane is a 26 y.o. at 64w3dhere in MAU reporting: before she had felt movement, now isn't feeling any.  Having some cramping in her lower abd and pain in her low back, 'very tight'. No bleeding  Onset of complaint: Sunday- no movement, cramping started Monday Pain score: mild, m/m Vitals:   04/09/22 1039  BP: 120/78  Pulse: 95  Resp: 16  Temp: 98.3 F (36.8 C)  SpO2: 100%     FHT:148 Lab orders placed from triage:  urine

## 2022-04-10 ENCOUNTER — Ambulatory Visit: Payer: BC Managed Care – PPO | Attending: Obstetrics and Gynecology

## 2022-04-10 ENCOUNTER — Ambulatory Visit: Payer: BC Managed Care – PPO | Admitting: *Deleted

## 2022-04-10 VITALS — BP 124/84 | HR 99

## 2022-04-10 DIAGNOSIS — Z363 Encounter for antenatal screening for malformations: Secondary | ICD-10-CM | POA: Insufficient documentation

## 2022-04-10 DIAGNOSIS — O09899 Supervision of other high risk pregnancies, unspecified trimester: Secondary | ICD-10-CM

## 2022-04-10 DIAGNOSIS — O09292 Supervision of pregnancy with other poor reproductive or obstetric history, second trimester: Secondary | ICD-10-CM | POA: Diagnosis not present

## 2022-04-10 DIAGNOSIS — Z3A19 19 weeks gestation of pregnancy: Secondary | ICD-10-CM | POA: Diagnosis not present

## 2022-04-10 DIAGNOSIS — O34219 Maternal care for unspecified type scar from previous cesarean delivery: Secondary | ICD-10-CM | POA: Insufficient documentation

## 2022-04-10 DIAGNOSIS — Z3480 Encounter for supervision of other normal pregnancy, unspecified trimester: Secondary | ICD-10-CM | POA: Diagnosis not present

## 2022-04-10 DIAGNOSIS — O09212 Supervision of pregnancy with history of pre-term labor, second trimester: Secondary | ICD-10-CM | POA: Diagnosis not present

## 2022-04-10 DIAGNOSIS — Z148 Genetic carrier of other disease: Secondary | ICD-10-CM | POA: Diagnosis not present

## 2022-04-11 ENCOUNTER — Other Ambulatory Visit: Payer: Self-pay | Admitting: *Deleted

## 2022-04-11 DIAGNOSIS — Z8759 Personal history of other complications of pregnancy, childbirth and the puerperium: Secondary | ICD-10-CM

## 2022-04-11 DIAGNOSIS — O34219 Maternal care for unspecified type scar from previous cesarean delivery: Secondary | ICD-10-CM

## 2022-04-11 DIAGNOSIS — Z362 Encounter for other antenatal screening follow-up: Secondary | ICD-10-CM

## 2022-04-11 DIAGNOSIS — O09899 Supervision of other high risk pregnancies, unspecified trimester: Secondary | ICD-10-CM

## 2022-04-17 ENCOUNTER — Ambulatory Visit (INDEPENDENT_AMBULATORY_CARE_PROVIDER_SITE_OTHER): Payer: BC Managed Care – PPO | Admitting: Obstetrics

## 2022-04-17 ENCOUNTER — Encounter: Payer: Self-pay | Admitting: Obstetrics

## 2022-04-17 ENCOUNTER — Encounter: Payer: BC Managed Care – PPO | Admitting: Obstetrics

## 2022-04-17 VITALS — BP 127/85 | HR 96 | Wt 142.2 lb

## 2022-04-17 DIAGNOSIS — Z98891 History of uterine scar from previous surgery: Secondary | ICD-10-CM

## 2022-04-17 DIAGNOSIS — Z1332 Encounter for screening for maternal depression: Secondary | ICD-10-CM | POA: Diagnosis not present

## 2022-04-17 DIAGNOSIS — Z3A20 20 weeks gestation of pregnancy: Secondary | ICD-10-CM

## 2022-04-17 DIAGNOSIS — O0992 Supervision of high risk pregnancy, unspecified, second trimester: Secondary | ICD-10-CM

## 2022-04-17 DIAGNOSIS — Z8759 Personal history of other complications of pregnancy, childbirth and the puerperium: Secondary | ICD-10-CM | POA: Diagnosis not present

## 2022-04-17 DIAGNOSIS — M549 Dorsalgia, unspecified: Secondary | ICD-10-CM

## 2022-04-17 DIAGNOSIS — G8929 Other chronic pain: Secondary | ICD-10-CM

## 2022-04-17 DIAGNOSIS — O0990 Supervision of high risk pregnancy, unspecified, unspecified trimester: Secondary | ICD-10-CM

## 2022-04-17 MED ORDER — ASPIRIN 81 MG PO CHEW: 81.0000 mg | CHEWABLE_TABLET | Freq: Every day | ORAL | 5 refills | Status: DC

## 2022-04-17 NOTE — Progress Notes (Signed)
Subjective:  Kelly Lane is a 26 y.o. [phone] at 1w4dbeing seen today for ongoing prenatal care.  She is currently monitored for the following issues for this high-risk pregnancy and has Supervision of high-risk pregnancy; Rh negative state in antepartum period; Severe preeclampsia, delivered; H/O emergency cesarean section; Supervision of other normal pregnancy, antepartum; Cystic fibrosis carrier, antepartum; and H/O severe pre-eclampsia on their problem list.  Patient reports backache.  Contractions: Irritability. Vag. Bleeding: None.  Movement: Present. Denies leaking of fluid.   The following portions of the patient's history were reviewed and updated as appropriate: allergies, current medications, past family history, past medical history, past social history, past surgical history and problem list. Problem list updated.  Objective:   Vitals:   04/17/22 1310  BP: 127/85  Pulse: 96  Weight: 142 lb 3.2 oz (64.5 kg)    Fetal Status: Fetal Heart Rate (bpm): 150   Movement: Present     General:  Alert, oriented and cooperative. Patient is in no acute distress.  Skin: Skin is warm and dry. No rash noted.   Cardiovascular: Normal heart rate noted  Respiratory: Normal respiratory effort, no problems with respiration noted  Abdomen: Soft, gravid, appropriate for gestational age. Pain/Pressure: Present     Pelvic:  Cervical exam deferred        Extremities: Normal range of motion.  Edema: None  Mental Status: Normal mood and affect. Normal behavior. Normal judgment and thought content.   Urinalysis:      Assessment and Plan:  Pregnancy: [id number] 275w4d1. Supervision of high risk pregnancy, antepartum  2. H/O severe pre-eclampsia Rx: - aspirin 81 MG chewable tablet; Chew 1 tablet (81 mg total) by mouth daily.  Dispense: 30 tablet; Refill: 5  3. H/O emergency cesarean section  4. Other chronic back pain - Maternity Belt recommended    Preterm labor symptoms and general  obstetric precautions including but not limited to vaginal bleeding, contractions, leaking of fluid and fetal movement were reviewed in detail with the patient. Please refer to After Visit Summary for other counseling recommendations.   Return in about 4 weeks (around 05/15/2022) [facility].   HaShelly BombardMD 04/17/22

## 2022-04-17 NOTE — Progress Notes (Signed)
Patient presents for New OB. Patient has had all of prenatal labs completed. Complains of having tooth pain. Patient advised to schedule appt with her dentist. No other concerns.

## 2022-05-05 NOTE — L&D Delivery Note (Signed)
OB/GYN Faculty Practice Delivery Note  Kelly Lane is a 27 y.o. [id number] s/p VD at [redacted]w[redacted]d. She was admitted for IOL 2/2 gHTN.   ROM: 28h 45m with clear fluid GBS Status:  Positive/-- (04/01 1351) Maximum Maternal Temperature: 99.3  Labor Progress: Initial SVE: closed. She then progressed to complete.   Delivery Date/Time: 08/12/2022 @ 1703 Delivery: Called to room and patient was complete and pushing. Head delivered LOA. No nuchal cord present. Shoulder and body delivered in usual fashion. Infant with poor effort to spontaneously cry, placed on mother's abdomen, dried and stimulated. Cord clamped x 2 after several seconds delay, and cut at direction of NICU MD by FOB. Cord gas and blood drawn. Placenta delivered spontaneously with gentle cord traction. Fundus firm with massage and Pitocin. Labia, perineum, vagina, and cervix inspected with 2nd degree laceration and right labial skin laceration repaired with 3.0 vicryl and 4.0 monocryl respectively.  Baby Weight: pending  Placenta: 3 vessel, intact. Sent to L&D Complications: None Lacerations: See above.  EBL: 325 mL Analgesia: Epidural   Infant:  APGAR (1 MIN): 6   APGAR (5 MINS): 8    [name], DO OB Fellow, Faculty Practice [employer], Center for [employer] 08/12/2022, 7:20 PM

## 2022-05-15 ENCOUNTER — Ambulatory Visit: Payer: BC Managed Care – PPO | Attending: Obstetrics and Gynecology

## 2022-05-15 ENCOUNTER — Ambulatory Visit: Payer: BC Managed Care – PPO | Admitting: *Deleted

## 2022-05-15 VITALS — BP 119/82 | HR 78

## 2022-05-15 DIAGNOSIS — Z363 Encounter for antenatal screening for malformations: Secondary | ICD-10-CM | POA: Diagnosis not present

## 2022-05-15 DIAGNOSIS — O09293 Supervision of pregnancy with other poor reproductive or obstetric history, third trimester: Secondary | ICD-10-CM | POA: Diagnosis not present

## 2022-05-15 DIAGNOSIS — O34219 Maternal care for unspecified type scar from previous cesarean delivery: Secondary | ICD-10-CM | POA: Insufficient documentation

## 2022-05-15 DIAGNOSIS — O09899 Supervision of other high risk pregnancies, unspecified trimester: Secondary | ICD-10-CM | POA: Diagnosis not present

## 2022-05-15 DIAGNOSIS — Z3A33 33 weeks gestation of pregnancy: Secondary | ICD-10-CM | POA: Diagnosis not present

## 2022-05-15 DIAGNOSIS — Z3480 Encounter for supervision of other normal pregnancy, unspecified trimester: Secondary | ICD-10-CM

## 2022-05-15 DIAGNOSIS — O09292 Supervision of pregnancy with other poor reproductive or obstetric history, second trimester: Secondary | ICD-10-CM | POA: Diagnosis not present

## 2022-05-15 DIAGNOSIS — Z3A24 24 weeks gestation of pregnancy: Secondary | ICD-10-CM

## 2022-05-15 DIAGNOSIS — Z362 Encounter for other antenatal screening follow-up: Secondary | ICD-10-CM | POA: Insufficient documentation

## 2022-05-15 DIAGNOSIS — O285 Abnormal chromosomal and genetic finding on antenatal screening of mother: Secondary | ICD-10-CM | POA: Diagnosis not present

## 2022-05-15 DIAGNOSIS — Z141 Cystic fibrosis carrier: Secondary | ICD-10-CM | POA: Insufficient documentation

## 2022-05-15 DIAGNOSIS — Z8759 Personal history of other complications of pregnancy, childbirth and the puerperium: Secondary | ICD-10-CM | POA: Diagnosis not present

## 2022-05-15 DIAGNOSIS — O1493 Unspecified pre-eclampsia, third trimester: Secondary | ICD-10-CM | POA: Diagnosis not present

## 2022-05-15 DIAGNOSIS — O09212 Supervision of pregnancy with history of pre-term labor, second trimester: Secondary | ICD-10-CM

## 2022-05-16 ENCOUNTER — Other Ambulatory Visit: Payer: Self-pay | Admitting: *Deleted

## 2022-05-16 DIAGNOSIS — O09299 Supervision of pregnancy with other poor reproductive or obstetric history, unspecified trimester: Secondary | ICD-10-CM

## 2022-05-16 DIAGNOSIS — O09899 Supervision of other high risk pregnancies, unspecified trimester: Secondary | ICD-10-CM

## 2022-05-16 DIAGNOSIS — O283 Abnormal ultrasonic finding on antenatal screening of mother: Secondary | ICD-10-CM

## 2022-05-19 ENCOUNTER — Ambulatory Visit (INDEPENDENT_AMBULATORY_CARE_PROVIDER_SITE_OTHER): Payer: BC Managed Care – PPO | Admitting: Obstetrics and Gynecology

## 2022-05-19 ENCOUNTER — Encounter: Payer: Self-pay | Admitting: Obstetrics and Gynecology

## 2022-05-19 VITALS — BP 125/88 | HR 83 | Wt 150.0 lb

## 2022-05-19 DIAGNOSIS — Z3482 Encounter for supervision of other normal pregnancy, second trimester: Secondary | ICD-10-CM

## 2022-05-19 DIAGNOSIS — Z3A25 25 weeks gestation of pregnancy: Secondary | ICD-10-CM

## 2022-05-19 DIAGNOSIS — Z98891 History of uterine scar from previous surgery: Secondary | ICD-10-CM

## 2022-05-19 DIAGNOSIS — O26892 Other specified pregnancy related conditions, second trimester: Secondary | ICD-10-CM

## 2022-05-19 DIAGNOSIS — Z6791 Unspecified blood type, Rh negative: Secondary | ICD-10-CM

## 2022-05-19 DIAGNOSIS — Z8759 Personal history of other complications of pregnancy, childbirth and the puerperium: Secondary | ICD-10-CM

## 2022-05-19 DIAGNOSIS — Z3480 Encounter for supervision of other normal pregnancy, unspecified trimester: Secondary | ICD-10-CM

## 2022-05-19 NOTE — Progress Notes (Signed)
   PRENATAL VISIT NOTE  Subjective:  Kelly Lane is a 27 y.o. [phone] at 27w1dbeing seen today for ongoing prenatal care.  She is currently monitored for the following issues for this high-risk pregnancy and has Supervision of high-risk pregnancy; Rh negative state in antepartum period; Severe preeclampsia, delivered; H/O emergency cesarean section; Supervision of other normal pregnancy, antepartum; Cystic fibrosis carrier, antepartum; and H/O severe pre-eclampsia on their problem list.  Patient reports no complaints.  Contractions: Irritability. Vag. Bleeding: None.  Movement: Present. Denies leaking of fluid.   The following portions of the patient's history were reviewed and updated as appropriate: allergies, current medications, past family history, past medical history, past social history, past surgical history and problem list.   Objective:   Vitals:   05/19/22 0847  BP: 125/88  Pulse: 83  Weight: 150 lb (68 kg)    Fetal Status: Fetal Heart Rate (bpm): 145 Fundal Height: 25 cm Movement: Present     General:  Alert, oriented and cooperative. Patient is in no acute distress.  Skin: Skin is warm and dry. No rash noted.   Cardiovascular: Normal heart rate noted  Respiratory: Normal respiratory effort, no problems with respiration noted  Abdomen: Soft, gravid, appropriate for gestational age.  Pain/Pressure: Absent     Pelvic: Cervical exam deferred        Extremities: Normal range of motion.     Mental Status: Normal mood and affect. Normal behavior. Normal judgment and thought content.   Assessment and Plan:  Pregnancy: [id number] 248w1d. Supervision of other normal pregnancy, antepartum Patient is doing well. Reassurance provided regarding strength of fetal movement at this gestational age Third trimester labs and glucola next visit Follow up anatomy ultrasound 2/8  2. H/O severe pre-eclampsia Normotensive and asymptomatic [facility]  3. H/O  emergency cesarean section Discussed TOLAC vs RCS Information sheet provided Consent form to be signed next visit  4. Rh negative state in antepartum period Rhogam next visit  Preterm labor symptoms and general obstetric precautions including but not limited to vaginal bleeding, contractions, leaking of fluid and fetal movement were reviewed in detail with the patient. Please refer to After Visit Summary for other counseling recommendations.   Return in about 4 weeks (around 06/16/2022) for in person, ROB, 2 hr glucola next visit.  Future Appointments  Date Time Provider [place]/12/[place]  2:15 PM [facility]  2:30 PM [facility] US3 [facility] [place]  PeMora BellmanMD

## 2022-05-19 NOTE — Progress Notes (Addendum)
ROB 25.[redacted] wks GA Reports decreased FM today FM noted on exam FHR heard with doppler

## 2022-05-19 NOTE — Patient Instructions (Signed)
Options for [place] in the [place]  As you review your birthing options, consider having a birth doula. A doula is trained to provide support before, during and just after you give birth. There are also postpartum doulas that help you adjust to new parenthood.  While doulas do not provide medical care, they do provide emotional, physical and educational support. A few months before your baby arrives, doulas can help answer questions, ease concerns and help you create and support your birthing plan.    Doulas can help reduce your stress and comfort you and your partner. They can help you cope with labor by helping you use breathing techniques, massage, creative labor positioning, essential oils and affirmations.   Studies show that the benefits of having a doula include:   A more positive birth experience  Fewer requests for pain-relief medication  Less likelihood of cesarean section, commonly called a c-section   Doulas are typically hired via a Charity fundraiser between you and the doula. We are happy to provide a list of the most active doulas in the area, all of whom are credentialed by [facility] and will not count as a visitor at your birth.  There are several options for no-cost doula care at our hospital, including:  [place] [employer] A [employer] M[employer] Doula Study  For more information on these programs or to receive a list of doulas active in our area, please email [email or web address]

## 2022-05-28 ENCOUNTER — Encounter (HOSPITAL_COMMUNITY): Payer: Self-pay | Admitting: Obstetrics and Gynecology

## 2022-05-28 ENCOUNTER — Inpatient Hospital Stay (HOSPITAL_COMMUNITY)
Admission: AD | Admit: 2022-05-28 | Discharge: 2022-05-28 | Disposition: A | Discharge status: Home / Self Care | Payer: BC Managed Care – PPO | Attending: Obstetrics and Gynecology | Admitting: Obstetrics and Gynecology

## 2022-05-28 DIAGNOSIS — M549 Dorsalgia, unspecified: Secondary | ICD-10-CM | POA: Insufficient documentation

## 2022-05-28 DIAGNOSIS — O26892 Other specified pregnancy related conditions, second trimester: Secondary | ICD-10-CM | POA: Insufficient documentation

## 2022-05-28 DIAGNOSIS — Z3A26 26 weeks gestation of pregnancy: Secondary | ICD-10-CM | POA: Diagnosis not present

## 2022-05-28 DIAGNOSIS — O99892 Other specified diseases and conditions complicating childbirth: Secondary | ICD-10-CM

## 2022-05-28 DIAGNOSIS — O99891 Other specified diseases and conditions complicating pregnancy: Secondary | ICD-10-CM | POA: Diagnosis not present

## 2022-05-28 LAB — URINALYSIS, ROUTINE W REFLEX MICROSCOPIC
Bilirubin Urine: NEGATIVE
Glucose, UA: NEGATIVE mg/dL
Hgb urine dipstick: NEGATIVE
Ketones, ur: NEGATIVE mg/dL
Leukocytes,Ua: NEGATIVE
Nitrite: NEGATIVE
Protein, ur: NEGATIVE mg/dL
Specific Gravity, Urine: 1.027 (ref 1.005–1.030)
pH: 6 (ref 5.0–8.0)

## 2022-05-28 NOTE — MAU Provider Note (Signed)
History     [id number]  Arrival date and time: 05/28/22 1406    Chief Complaint  Patient presents with   Back Pain   pelvic pressure     HPI Kelly Lane is a 27 y.o. at 52w3dby 7 wk [place]ore[place]ith PMHx notable for severe pre-e and 33 wk cesarean delivery in last pregnancy, who presents for back pain.   Patient reports that back pain started last night Comes and goes, worse with movement Has not taken anything Is wearing a maternity belt but it's not helping much Denies any contractions, vaginal bleeding or leaking fluid No burning or pain with urination Fetal movement is normal No vaginal discharge   AB/Negative/-- (11/13 1616)  OB History     Gravida  3   Para  1   Term  0   Preterm  1   AB  1   Living  1      SAB  0   IAB  1   Ectopic  0   Multiple  0   Live Births  1           Past Medical History:  Diagnosis Date   Anxiety    Endometriosis 07/2015   GERD (gastroesophageal reflux disease)    NO MEDS   Ovarian cyst 07/2015    Past Surgical History:  Procedure Laterality Date   CESAREAN SECTION N/A 09/20/2020   Procedure: CESAREAN SECTION;  Surgeon: AOsborne Oman MD;  Location: [facility];  Service: Obstetrics;  Laterality: N/A;   LAPAROSCOPIC OVARIAN CYSTECTOMY Right 07/17/2015   Procedure: EXCISION OF ENDOMETRIOSIS;  Surgeon: RGae Dry MD;  Location: [facility] ORS;  Service: Gynecology;  Laterality: Right;   LAPAROSCOPY N/A 07/17/2015   Procedure: LAPAROSCOPY OPERATIVE;  Surgeon: RGae Dry MD;  Location: [facility] ORS;  Service: Gynecology;  Laterality: N/A;    Family History  Problem Relation Age of Onset   Hypertension Mother    Asthma Neg Hx    Cancer Neg Hx    Diabetes Neg Hx    Heart disease Neg Hx     Social History   Socioeconomic History   Marital status: Single    Spouse name: Not on file   Number of children: Not on file   Years of education: Not on file   Highest education level: Not on file  Occupational  History   Not on file  Tobacco Use   Smoking status: Never   Smokeless tobacco: Never  Vaping Use   Vaping Use: Never used  Substance and Sexual Activity   Alcohol use: Not Currently    Comment: every six months maybe 1 drink   Drug use: No   Sexual activity: Yes    Partners: Male    Birth control/protection: None    Comment: Pregnant   Other Topics Concern   Not on file  Social History Narrative   Not on file   Social Determinants of Health   Financial Resource Strain: Not on file  Food Insecurity: Not on file  Transportation Needs: Not on file  Physical Activity: Not on file  Stress: Not on file  Social Connections: Not on file  Intimate Partner Violence: Not on file    No Known Allergies  No current facility-administered medications on file prior to encounter.   Current Outpatient Medications on File Prior to Encounter  Medication Sig Dispense Refill   Prenatal MV & Min w/FA-DHA (PRENATAL GUMMIES PO) Take 1 tablet by mouth  daily.     aspirin 81 MG chewable tablet Chew 1 tablet (81 mg total) by mouth daily. (Patient not taking: Reported on 05/15/2022) 30 tablet 5   Blood Pressure Monitoring (BLOOD PRESSURE KIT) DEVI 1 kit by Does not apply route once a week. 1 each 0   ondansetron (ZOFRAN-ODT) 8 MG disintegrating tablet Take 1 tablet (8 mg total) by mouth every 8 (eight) hours as needed for nausea or vomiting. (Patient not taking: Reported on 04/17/2022) 20 tablet 2     ROS Pertinent positives and negative per HPI, all others reviewed and negative  Physical Exam   BP 99/63   Pulse 89   Temp 98.5 F (36.9 C) (Oral)   Resp 17   Ht '5\' 2"'$  (1.575 m)   Wt 68.5 kg   LMP 11/18/2021 (Approximate)   SpO2 100%   BMI 27.64 kg/m   Patient Vitals for the past 24 hrs:  BP Temp Temp src Pulse Resp SpO2 Height Weight  05/28/22 1558 99/63 -- -- 89 -- -- -- --  05/28/22 1443 97/61 -- -- 85 17 -- -- --  05/28/22 1423 129/89 98.5 F (36.9 C) Oral 97 16 100 % '5\' 2"'$  (1.575  m) 68.5 kg    Physical Exam Vitals reviewed.  Constitutional:      General: She is not in acute distress.    Appearance: She is well-developed. She is not diaphoretic.  Eyes:     General: No scleral icterus. Pulmonary:     Effort: Pulmonary effort is normal. No respiratory distress.  Abdominal:     General: There is no distension.     Palpations: Abdomen is soft.     Tenderness: There is no abdominal tenderness. There is no guarding or rebound.  Skin:    General: Skin is warm and dry.  Neurological:     Mental Status: She is alert.     Coordination: Coordination normal.      Cervical Exam Dilation: Closed Effacement (%): Thick Exam by:: Dr Dione Plover  Bedside Ultrasound Not done  My interpretation: n/a  FHT Baseline 140, moderate variability, +accels (10x10, appropriate for gestational age), no decels Toco: flat Cat: I  Labs Results for orders placed or performed during the hospital encounter of 05/28/22 (from the past 24 hour(s))  Urinalysis, Routine w reflex microscopic -Urine, Clean Catch     Status: None   Collection Time: 05/28/22  2:47 PM  Result Value Ref Range   Color, Urine YELLOW YELLOW   APPearance CLEAR CLEAR   Specific Gravity, Urine 1.027 1.005 - 1.030   pH 6.0 5.0 - 8.0   Glucose, UA NEGATIVE NEGATIVE mg/dL   Hgb urine dipstick NEGATIVE NEGATIVE   Bilirubin Urine NEGATIVE NEGATIVE   Ketones, ur NEGATIVE NEGATIVE mg/dL   Protein, ur NEGATIVE NEGATIVE mg/dL   Nitrite NEGATIVE NEGATIVE   Leukocytes,Ua NEGATIVE NEGATIVE    Imaging No results found.  MAU Course  Procedures Lab Orders         Urinalysis, Routine w reflex microscopic -Urine, Clean Catch    No orders of the defined types were placed in this encounter.  Imaging Orders  No imaging studies ordered today    MDM moderate  Assessment and Plan  #Back pain in pregnancy, second trimester #[redacted] weeks gestation of pregnancy Cervix long and closed, no suspicion of preterm labor. No  symptoms to suggest infectious etiology. Discussed with patient this is likely discomfort due to advancing gestational age. Reviewed heating pads, maternity belt with greater  support, etc.   #FWB FHT Cat I NST: Reactive   Dispo: discharged to home in stable condition.   Clarnce Flock, MD/MPH 05/28/22 4:17 PM  Allergies as of 05/28/2022   No Known Allergies      Medication List     TAKE these medications    aspirin 81 MG chewable tablet Chew 1 tablet (81 mg total) by mouth daily.   Blood Pressure Kit Devi 1 kit by Does not apply route once a week.   ondansetron 8 MG disintegrating tablet Commonly known as: ZOFRAN-ODT Take 1 tablet (8 mg total) by mouth every 8 (eight) hours as needed for nausea or vomiting.   PRENATAL GUMMIES PO Take 1 tablet by mouth daily.

## 2022-05-28 NOTE — MAU Note (Signed)
Kelly Lane is a 27 y.o. at 42w3dhere in MAU reporting: back has been hurting for a while.  Last night things got worse, having problems walking.   Tried a warm shower and a heating pad, usually helps, but it didn't. This morning started feeling pelvic pressure. Denies bleeding or LOF.  Reports baby is moving, but less than usual.  Denies urinary problems, diarrhea or constipation.   Onset of complaint: escalated yesterday Pain score: 8 Vitals:   05/28/22 1423  BP: 129/89  Pulse: 97  Resp: 16  Temp: 98.5 F (36.9 C)  SpO2: 100%     FHT:148 Lab orders placed from triage:  urine

## 2022-06-01 ENCOUNTER — Inpatient Hospital Stay (HOSPITAL_COMMUNITY)
Admission: AD | Admit: 2022-06-01 | Discharge: 2022-06-02 | Disposition: A | Discharge status: Home / Self Care | Payer: BC Managed Care – PPO | Attending: Obstetrics & Gynecology | Admitting: Obstetrics & Gynecology

## 2022-06-01 DIAGNOSIS — Z3A27 27 weeks gestation of pregnancy: Secondary | ICD-10-CM

## 2022-06-01 DIAGNOSIS — K529 Noninfective gastroenteritis and colitis, unspecified: Secondary | ICD-10-CM | POA: Insufficient documentation

## 2022-06-01 DIAGNOSIS — O99282 Endocrine, nutritional and metabolic diseases complicating pregnancy, second trimester: Secondary | ICD-10-CM | POA: Insufficient documentation

## 2022-06-01 DIAGNOSIS — O218 Other vomiting complicating pregnancy: Secondary | ICD-10-CM | POA: Insufficient documentation

## 2022-06-01 DIAGNOSIS — E860 Dehydration: Secondary | ICD-10-CM

## 2022-06-02 ENCOUNTER — Other Ambulatory Visit: Payer: Self-pay

## 2022-06-02 ENCOUNTER — Encounter (HOSPITAL_COMMUNITY): Payer: Self-pay | Admitting: Obstetrics & Gynecology

## 2022-06-02 DIAGNOSIS — Z3A27 27 weeks gestation of pregnancy: Secondary | ICD-10-CM | POA: Diagnosis not present

## 2022-06-02 DIAGNOSIS — E860 Dehydration: Secondary | ICD-10-CM

## 2022-06-02 DIAGNOSIS — O99282 Endocrine, nutritional and metabolic diseases complicating pregnancy, second trimester: Secondary | ICD-10-CM | POA: Diagnosis not present

## 2022-06-02 DIAGNOSIS — K529 Noninfective gastroenteritis and colitis, unspecified: Secondary | ICD-10-CM

## 2022-06-02 DIAGNOSIS — O218 Other vomiting complicating pregnancy: Secondary | ICD-10-CM | POA: Diagnosis not present

## 2022-06-02 LAB — CBC
HCT: 36.2 % (ref 36.0–46.0)
Hemoglobin: 13.2 g/dL (ref 12.0–15.0)
MCH: 34.3 pg — ABNORMAL HIGH (ref 26.0–34.0)
MCHC: 36.5 g/dL — ABNORMAL HIGH (ref 30.0–36.0)
MCV: 94 fL (ref 80.0–100.0)
Platelets: 258 10*3/uL (ref 150–400)
RBC: 3.85 MIL/uL — ABNORMAL LOW (ref 3.87–5.11)
RDW: 12.4 % (ref 11.5–15.5)
WBC: 10.8 10*3/uL — ABNORMAL HIGH (ref 4.0–10.5)
nRBC: 0 % (ref 0.0–0.2)

## 2022-06-02 LAB — COMPREHENSIVE METABOLIC PANEL
ALT: 11 U/L (ref 0–44)
AST: 18 U/L (ref 15–41)
Albumin: 3 g/dL — ABNORMAL LOW (ref 3.5–5.0)
Alkaline Phosphatase: 76 U/L (ref 38–126)
Anion gap: 11 (ref 5–15)
BUN: 8 mg/dL (ref 6–20)
CO2: 22 mmol/L (ref 22–32)
Calcium: 9 mg/dL (ref 8.9–10.3)
Chloride: 102 mmol/L (ref 98–111)
Creatinine, Ser: 0.69 mg/dL (ref 0.44–1.00)
GFR, Estimated: >60 mL/min (ref >60)
Glucose, Bld: 97 mg/dL (ref 70–99)
Potassium: 3.9 mmol/L (ref 3.5–5.1)
Sodium: 135 mmol/L (ref 135–145)
Total Bilirubin: 1.1 mg/dL (ref 0.3–1.2)
Total Protein: 6.4 g/dL — ABNORMAL LOW (ref 6.5–8.1)

## 2022-06-02 LAB — URINALYSIS, ROUTINE W REFLEX MICROSCOPIC
Bilirubin Urine: NEGATIVE
Glucose, UA: NEGATIVE mg/dL
Hgb urine dipstick: NEGATIVE
Ketones, ur: NEGATIVE mg/dL
Leukocytes,Ua: NEGATIVE
Nitrite: NEGATIVE
Protein, ur: NEGATIVE mg/dL
Specific Gravity, Urine: 1.029 (ref 1.005–1.030)
pH: 5 (ref 5.0–8.0)

## 2022-06-02 MED ORDER — PROCHLORPERAZINE EDISYLATE 10 MG/2ML IJ SOLN
10.0000 mg | Freq: Once | INTRAMUSCULAR | Status: AC
  Administered 2022-06-02: 10 mg via INTRAVENOUS

## 2022-06-02 MED ORDER — PROCHLORPERAZINE EDISYLATE 10 MG/2ML IJ SOLN
INTRAMUSCULAR | Status: AC
  Filled 2022-06-02: qty 2

## 2022-06-02 MED ORDER — DIPHENHYDRAMINE HCL 25 MG PO CAPS
25.0000 mg | ORAL_CAPSULE | Freq: Once | ORAL | Status: AC
  Administered 2022-06-02: 25 mg via ORAL
  Filled 2022-06-02: qty 1

## 2022-06-02 MED ORDER — SODIUM CHLORIDE 0.9 % IV SOLN
25.0000 mg | Freq: Once | INTRAVENOUS | Status: DC
  Filled 2022-06-02: qty 1

## 2022-06-02 MED ORDER — PROMETHAZINE HCL 25 MG PO TABS: 12.5000 mg | ORAL_TABLET | Freq: Four times a day (QID) | ORAL | 0 refills | Status: DC | PRN

## 2022-06-02 MED ORDER — LACTATED RINGERS IV BOLUS
1000.0000 mL | Freq: Once | INTRAVENOUS | Status: AC
  Administered 2022-06-02: 1000 mL via INTRAVENOUS

## 2022-06-02 MED ORDER — SCOPOLAMINE 1 MG/3DAYS TD PT72
1.0000 | MEDICATED_PATCH | TRANSDERMAL | Status: DC
  Administered 2022-06-02: 1.5 mg via TRANSDERMAL
  Filled 2022-06-02: qty 1

## 2022-06-02 MED ORDER — SCOPOLAMINE 1 MG/3DAYS TD PT72: 1.0000 | MEDICATED_PATCH | TRANSDERMAL | 12 refills | Status: DC

## 2022-06-02 MED ORDER — FAMOTIDINE IN NACL 20-0.9 MG/50ML-% IV SOLN
20.0000 mg | Freq: Once | INTRAVENOUS | Status: AC
  Administered 2022-06-02: 20 mg via INTRAVENOUS
  Filled 2022-06-02: qty 50

## 2022-06-02 NOTE — MAU Note (Signed)
Pt has vomited once in the last 2 hours-pt declined antemetic. "Just give me a minute" CNM in to follow up with patient

## 2022-06-02 NOTE — MAU Note (Signed)
Pt reports she continues for feel "off" from compazine. Pt agreeable to benadryl for treatment of medication reaction.

## 2022-06-02 NOTE — MAU Note (Signed)
Baby is very active-difficult to maintain continuous fetal tracing.

## 2022-06-02 NOTE — MAU Provider Note (Signed)
History     CSN: [id number]  Arrival date and time: 06/01/22 2337   Event Date/Time   First Provider Initiated Contact with Patient 06/02/22 0047      Chief Complaint  Patient presents with   Emesis   Nausea   Abdominal Pain   27 y.o. [id number] '@27'$ .1 wks presenting with N/V. Reports onset about 6 hrs ago. Cannot tolerate food or fluids. She tried Zofran but it didn't help. Denies sick contacts, fever, or diarrhea. Reports some mid abdominal pain since started vomiting. Pain is intermittent and cramping. Rates 8/10. Reports good FM.     OB History     Gravida  3   Para  1   Term  0   Preterm  1   AB  1   Living  1      SAB  0   IAB  1   Ectopic  0   Multiple  0   Live Births  1           Past Medical History:  Diagnosis Date   Anxiety    Endometriosis 07/2015   GERD (gastroesophageal reflux disease)    NO MEDS   Ovarian cyst 07/2015    Past Surgical History:  Procedure Laterality Date   CESAREAN SECTION N/A 09/20/2020   Procedure: CESAREAN SECTION;  Surgeon: Osborne Oman, MD;  Location: [facility];  Service: Obstetrics;  Laterality: N/A;   LAPAROSCOPIC OVARIAN CYSTECTOMY Right 07/17/2015   Procedure: EXCISION OF ENDOMETRIOSIS;  Surgeon: Gae Dry, MD;  Location: [facility] ORS;  Service: Gynecology;  Laterality: Right;   LAPAROSCOPY N/A 07/17/2015   Procedure: LAPAROSCOPY OPERATIVE;  Surgeon: Gae Dry, MD;  Location: [facility] ORS;  Service: Gynecology;  Laterality: N/A;    Family History  Problem Relation Age of Onset   Hypertension Mother    Asthma Neg Hx    Cancer Neg Hx    Diabetes Neg Hx    Heart disease Neg Hx     Social History   Tobacco Use   Smoking status: Never   Smokeless tobacco: Never  Vaping Use   Vaping Use: Never used  Substance Use Topics   Alcohol use: Not Currently    Comment: every six months maybe 1 drink   Drug use: No    Allergies:  Allergies  Allergen Reactions   Compazine [Prochlorperazine]  Anxiety    No medications prior to admission.    Review of Systems  Constitutional:  Positive for chills. Negative for fever.  Gastrointestinal:  Positive for abdominal pain, nausea and vomiting. Negative for constipation and diarrhea.  Genitourinary:  Negative for dysuria, frequency, urgency, vaginal bleeding and vaginal discharge.   Physical Exam   Blood pressure 103/65, pulse (!) 104, temperature 97.9 F (36.6 C), temperature source Oral, resp. rate 14, height '5\' 2"'$  (1.575 m), weight 67.9 kg, last menstrual period 11/18/2021, SpO2 98 %, currently breastfeeding.  Physical Exam Vitals and nursing note reviewed.  Constitutional:      General: She is not in acute distress.    Appearance: Normal appearance.  HENT:     Head: Normocephalic and atraumatic.  Cardiovascular:     Rate and Rhythm: Normal rate.  Pulmonary:     Effort: Pulmonary effort is normal. No respiratory distress.  Abdominal:     Palpations: Abdomen is soft.     Tenderness: There is no abdominal tenderness.  Musculoskeletal:        General: Normal range of motion.  Cervical back: Normal range of motion.  Skin:    General: Skin is warm and dry.  Neurological:     General: No focal deficit present.     Mental Status: She is alert and oriented to person, place, and time.  Psychiatric:        Mood and Affect: Mood normal.        Behavior: Behavior normal.   EFM: 145 bpm, mod variability, + accels, no decels Toco: UI  Results for orders placed or performed during the hospital encounter of 06/01/22 (from the past 24 hour(s))  CBC     Status: Abnormal   Collection Time: 06/02/22 12:05 AM  Result Value Ref Range   WBC 10.8 (H) 4.0 - 10.5 K/uL   RBC 3.85 (L) 3.87 - 5.11 MIL/uL   Hemoglobin 13.2 12.0 - 15.0 g/dL   HCT 36.2 36.0 - 46.0 %   MCV 94.0 80.0 - 100.0 fL   MCH 34.3 (H) 26.0 - 34.0 pg   MCHC 36.5 (H) 30.0 - 36.0 g/dL   RDW 12.4 11.5 - 15.5 %   Platelets 258 150 - 400 K/uL   nRBC 0.0 0.0 - 0.2 %   Comprehensive metabolic panel     Status: Abnormal   Collection Time: 06/02/22 12:05 AM  Result Value Ref Range   Sodium 135 135 - 145 mmol/L   Potassium 3.9 3.5 - 5.1 mmol/L   Chloride 102 98 - 111 mmol/L   CO2 22 22 - 32 mmol/L   Glucose, Bld 97 70 - 99 mg/dL   BUN 8 6 - 20 mg/dL   Creatinine, Ser 0.69 0.44 - 1.00 mg/dL   Calcium 9.0 8.9 - 10.3 mg/dL   Total Protein 6.4 (L) 6.5 - 8.1 g/dL   Albumin 3.0 (L) 3.5 - 5.0 g/dL   AST 18 15 - 41 U/L   ALT 11 0 - 44 U/L   Alkaline Phosphatase 76 38 - 126 U/L   Total Bilirubin 1.1 0.3 - 1.2 mg/dL   GFR, Estimated >60 >60 mL/min   Anion gap 11 5 - 15  Urinalysis, Routine w reflex microscopic -Urine, Clean Catch     Status: Abnormal   Collection Time: 06/02/22 12:30 AM  Result Value Ref Range   Color, Urine YELLOW YELLOW   APPearance HAZY (A) CLEAR   Specific Gravity, Urine 1.029 1.005 - 1.030   pH 5.0 5.0 - 8.0   Glucose, UA NEGATIVE NEGATIVE mg/dL   Hgb urine dipstick NEGATIVE NEGATIVE   Bilirubin Urine NEGATIVE NEGATIVE   Ketones, ur NEGATIVE NEGATIVE mg/dL   Protein, ur NEGATIVE NEGATIVE mg/dL   Nitrite NEGATIVE NEGATIVE   Leukocytes,Ua NEGATIVE NEGATIVE   MAU Course  Procedures LR Phenergan Pepcid  MDM Labs ordered and reviewed.  0200: RN found pt up to BR, blood on floor, had pulled IV out. Upon questioning pt reports feeling nervous and wanted to get out of here. Suspect reaction to Compazine. Benadryl ordered. 0330: Pt feeling better although had another episode of vomiting. Declines further antiemetics. Requests to be left alone. 0645: Feeling better. No further emesis. Tolerating small amt of water and ice, declines trying crackers or something more. Will discharge home with strict return precautions. Assessment and Plan   1. [redacted] weeks gestation of pregnancy   2. Gastroenteritis   3. Dehydration    Discharge home Follow up at [facility] as scheduled Rx Scopolamine Rx Phenergan  Allergies as of 06/02/2022        Reactions  Compazine [prochlorperazine] Anxiety        Medication List     TAKE these medications    aspirin 81 MG chewable tablet Chew 1 tablet (81 mg total) by mouth daily.   Blood Pressure Kit Devi 1 kit by Does not apply route once a week.   ondansetron 8 MG disintegrating tablet Commonly known as: ZOFRAN-ODT Take 1 tablet (8 mg total) by mouth every 8 (eight) hours as needed for nausea or vomiting.   PRENATAL GUMMIES PO Take 1 tablet by mouth daily.   promethazine 25 MG tablet Commonly known as: PHENERGAN Take 0.5-1 tablets (12.5-25 mg total) by mouth every 6 (six) hours as needed for nausea or vomiting.   scopolamine 1 MG/3DAYS Commonly known as: TRANSDERM-SCOP Place 1 patch (1.5 mg total) onto the skin every 3 (three) days. Start taking on: June 05, 2022         [name], [place] 06/02/2022, 7:45 AM

## 2022-06-02 NOTE — MAU Note (Signed)
.  Kelly Lane is a 27 y.o. at 10w1dhere in MAU reporting: started having N/V at 142- tried drinking water, but unable to keep it down. Reports "slight stomach pains" from vomiting - in mid abdomen. States has thrown up 3-4 times since it started. Had left over zofran from early pregnancy - took that, but threw it up. Denies VB or LOF. Noticed rectal bleeding earlier - but it stopped. +FM   Onset of complaint: 1900 Pain score: 8 Vitals:   06/02/22 0009  BP: 117/81  Pulse: 95  Resp: 18  Temp: 98.1 F (36.7 C)  SpO2: 98%     FHT:147 Lab orders placed from triage:

## 2022-06-02 NOTE — MAU Note (Signed)
Called to room via pt stating that she needed to go to the bathroom-when RN entered the room pt was standing in the bathroom-she has removed her gown, external fetal monitors and pulled her IV out. She stated "something is wrong, I dont feel right." Pt reports feeling anxious and that she "had to get up"-531m LR infused, Pepcid completed. IV site clear and covered with bandaid. CNM notified

## 2022-06-02 NOTE — MAU Note (Signed)
Pt reports that nausea remains resolved and she has not had any additional episodes of vomiting since 1 after benadryl administration. Pt states the anxiety from compazine has resolved and she feels that she would like to be discharged to home. States movement creates nausea and would like a Scopolamine patch prior to discharge.

## 2022-06-12 ENCOUNTER — Ambulatory Visit: Payer: BC Managed Care – PPO | Admitting: *Deleted

## 2022-06-12 ENCOUNTER — Ambulatory Visit: Payer: BC Managed Care – PPO | Attending: Maternal & Fetal Medicine

## 2022-06-12 ENCOUNTER — Other Ambulatory Visit: Payer: Self-pay | Admitting: *Deleted

## 2022-06-12 VITALS — BP 123/80 | HR 73

## 2022-06-12 DIAGNOSIS — O283 Abnormal ultrasonic finding on antenatal screening of mother: Secondary | ICD-10-CM | POA: Insufficient documentation

## 2022-06-12 DIAGNOSIS — O09299 Supervision of pregnancy with other poor reproductive or obstetric history, unspecified trimester: Secondary | ICD-10-CM | POA: Diagnosis not present

## 2022-06-12 DIAGNOSIS — O09899 Supervision of other high risk pregnancies, unspecified trimester: Secondary | ICD-10-CM

## 2022-06-12 DIAGNOSIS — O09893 Supervision of other high risk pregnancies, third trimester: Secondary | ICD-10-CM

## 2022-06-12 DIAGNOSIS — O34219 Maternal care for unspecified type scar from previous cesarean delivery: Secondary | ICD-10-CM

## 2022-06-12 DIAGNOSIS — O09213 Supervision of pregnancy with history of pre-term labor, third trimester: Secondary | ICD-10-CM

## 2022-06-12 DIAGNOSIS — O09293 Supervision of pregnancy with other poor reproductive or obstetric history, third trimester: Secondary | ICD-10-CM

## 2022-06-12 DIAGNOSIS — Z3A28 28 weeks gestation of pregnancy: Secondary | ICD-10-CM

## 2022-06-12 DIAGNOSIS — Z141 Cystic fibrosis carrier: Secondary | ICD-10-CM

## 2022-06-13 ENCOUNTER — Telehealth: Payer: BC Managed Care – PPO | Admitting: Physician Assistant

## 2022-06-13 DIAGNOSIS — K219 Gastro-esophageal reflux disease without esophagitis: Secondary | ICD-10-CM

## 2022-06-13 DIAGNOSIS — O219 Vomiting of pregnancy, unspecified: Secondary | ICD-10-CM

## 2022-06-13 DIAGNOSIS — R112 Nausea with vomiting, unspecified: Secondary | ICD-10-CM | POA: Diagnosis not present

## 2022-06-13 MED ORDER — OMEPRAZOLE 20 MG PO CPDR: 20.0000 mg | DELAYED_RELEASE_CAPSULE | Freq: Every day | ORAL | 0 refills | Status: DC

## 2022-06-13 NOTE — Progress Notes (Signed)
Virtual Visit Consent   Kelly Lane, you are scheduled for a virtual visit with a [facility] provider today. Just as with appointments in the office, your consent must be obtained to participate. Your consent will be active for this visit and any virtual visit you may have with one of our providers in the next 365 days. If you have a MyChart account, a copy of this consent can be sent to you electronically.  As this is a virtual visit, video technology does not allow for your provider to perform a traditional examination. This may limit your provider's ability to fully assess your condition. If your provider identifies any concerns that need to be evaluated in person or the need to arrange testing (such as labs, EKG, etc.), we will make arrangements to do so. Although advances in technology are sophisticated, we cannot ensure that it will always work on either your end or our end. If the connection with a video visit is poor, the visit may have to be switched to a telephone visit. With either a video or telephone visit, we are not always able to ensure that we have a secure connection.  By engaging in this virtual visit, you consent to the provision of healthcare and authorize for your insurance to be billed (if applicable) for the services provided during this visit. Depending on your insurance coverage, you may receive a charge related to this service.  I need to obtain your verbal consent now. Are you willing to proceed with your visit today? Kelly Lane has provided verbal consent on 06/13/2022 for a virtual visit (video or telephone). Mar Daring, PA-C  Date: 06/13/2022 2:11 PM  Virtual Visit via Video Note   I, Mar Daring, connected with  Kelly Lane  ([id number], [birth year]) on 28/09/24 at  2:00 PM EST by a video-enabled telemedicine application and verified that I am speaking with the correct person using two identifiers.  Location: Patient: Virtual Visit Location Patient:  Home Provider: Virtual Visit Location Provider: Home Office   I discussed the limitations of evaluation and management by telemedicine and the availability of in person appointments. The patient expressed understanding and agreed to proceed.    History of Present Illness: Kelly Lane is a 27 y.o. who identifies as a female who was assigned female at birth, and is being seen today for nausea and vomiting in third trimester pregnancy. Was seen in ER/MAU on 06/01/22. She was diagnosed with gastroenteritis and dehydration. She was given Scopolamine patches and Promethazine. She also already has Zofran ODT 41m from her OB/GYN from 1st trimester nausea. She does report she is wearing the scopolamine patches but has not taken any Promethazine or Zofran. She did develop nausea again last night with vomiting. This morning she has had some continued nausea but was able to maintain some water and ginger ale and peppermint tea. She has since had 2 episodes of vomiting today. She does report some increased acid taste and heart burn feelings with current episode that she feels is increasing her nausea.    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 (Patient not taking: Reported on 05/15/2022), Disp: 30 tablet, Rfl: 5   Blood Pressure  Monitoring (BLOOD PRESSURE KIT) DEVI, 1 kit by Does not apply route once a week., Disp: 1 each, Rfl: 0   ondansetron (ZOFRAN-ODT) 8 MG disintegrating tablet, Take 1 tablet (8 mg total) by mouth every 8 (eight) hours as needed for nausea or vomiting. (Patient not taking: Reported on 06/12/2022), Disp: 20 tablet, Rfl: 2   Prenatal MV & Min w/FA-DHA (PRENATAL GUMMIES PO), Take 1 tablet by mouth daily., Disp: , Rfl:    promethazine (PHENERGAN) 25 MG tablet, Take 0.5-1 tablets (12.5-25 mg total) by mouth every 6 (six) hours as needed for nausea or vomiting., Disp: 30 tablet, Rfl: 0   scopolamine (TRANSDERM-SCOP) 1 MG/3DAYS, Place 1 patch (1.5 mg total) onto the skin every 3 (three) days., Disp: 10 patch, Rfl: 12  Observations/Objective: Patient is well-developed, well-nourished in no acute distress.  Resting comfortably at home.  Head is normocephalic, atraumatic.  No labored breathing.  Speech is clear and coherent with logical content.  Patient is alert and oriented at baseline.    Assessment and Plan: 1. Gastroesophageal reflux disease without esophagitis - omeprazole (PRILOSEC) 20 MG capsule; Take 1 capsule (20 mg total) by mouth daily.  Dispense: 30 capsule; Refill: 0  2. Nausea and vomiting, unspecified vomiting type - omeprazole (PRILOSEC) 20 MG capsule; Take 1 capsule (20 mg total) by mouth daily.  Dispense: 30 capsule; Refill: 0  - Continue medications she has previously been prescribed for nausea and vomiting - Add Omeprazole for GERD symptoms - Push fluids, electrolyte beverages - Keep scheduled follow up with OB/GYN on Monday, 06/16/22 - Strict ER precautions if nausea and vomiting continue despite the scopolamine patches, zofran and promethazine  Follow Up Instructions: I discussed the assessment and treatment plan with the patient. The patient was provided an opportunity to ask questions and all were answered. The patient agreed with the plan and demonstrated an understanding of  the instructions.  A copy of instructions were sent to the patient via MyChart unless otherwise noted below.    The patient was advised to call back or seek an in-person evaluation if the symptoms worsen or if the condition fails to improve as anticipated.  Time:  I spent 15 minutes with the patient via telehealth technology discussing the above problems/concerns.    Mar Daring, PA-C

## 2022-06-13 NOTE — Patient Instructions (Signed)
Kelly Lane, thank you for joining Mar Daring, PA-C for today's virtual visit.  While this provider is not your primary care provider (PCP), if your PCP is located in our provider database this encounter information will be shared with them immediately following your visit.   [facility] account gives you access to today's visit and all your visits, tests, and labs performed at [facility] " click here if you don't have a [facility] account or go to [email or web address]  Consent: (Patient) Kelly Lane provided verbal consent for this virtual visit at the beginning of the encounter.  Current Medications:  Current Outpatient Medications:    omeprazole (PRILOSEC) 20 MG capsule, Take 1 capsule (20 mg total) by mouth daily., Disp: 30 capsule, Rfl: 0   aspirin 81 MG chewable tablet, Chew 1 tablet (81 mg total) by mouth daily. (Patient not taking: Reported on 05/15/2022), Disp: 30 tablet, Rfl: 5   Blood Pressure Monitoring (BLOOD PRESSURE KIT) DEVI, 1 kit by Does not apply route once a week., Disp: 1 each, Rfl: 0   ondansetron (ZOFRAN-ODT) 8 MG disintegrating tablet, Take 1 tablet (8 mg total) by mouth every 8 (eight) hours as needed for nausea or vomiting. (Patient not taking: Reported on 06/12/2022), Disp: 20 tablet, Rfl: 2   Prenatal MV & Min w/FA-DHA (PRENATAL GUMMIES PO), Take 1 tablet by mouth daily., Disp: , Rfl:    promethazine (PHENERGAN) 25 MG tablet, Take 0.5-1 tablets (12.5-25 mg total) by mouth every 6 (six) hours as needed for nausea or vomiting., Disp: 30 tablet, Rfl: 0   scopolamine (TRANSDERM-SCOP) 1 MG/3DAYS, Place 1 patch (1.5 mg total) onto the skin every 3 (three) days., Disp: 10 patch, Rfl: 12   Medications ordered in this encounter:  Meds ordered this encounter  Medications   omeprazole (PRILOSEC) 20 MG capsule    Sig: Take 1 capsule (20 mg total) by mouth daily.    Dispense:  30 capsule    Refill:  0    Order Specific Question:    Supervising Provider    Answer:   Chase Picket [id number]     *If you need refills on other medications prior to your next appointment, please contact your pharmacy*  Follow-Up: Call back or seek an in-person evaluation if the symptoms worsen or if the condition fails to improve as anticipated.  [place] [phone]  Other Instructions  Heartburn During Pregnancy Heartburn is a type of pain or discomfort that can happen in the throat or chest. It is often described as a burning sensation. Heartburn is common during pregnancy because: Progesterone, a hormone that is released during pregnancy, may relax the valve that separates the esophagus from the stomach (lower esophageal sphincter, or LES). This allows stomach acid to move up into the esophagus, causing heartburn. The uterus gets larger and pushes up on the stomach, which pushes more acid into the esophagus. This is especially true in the later stages of pregnancy. Heartburn usually goes away or gets better after giving birth. What are the causes? This condition is caused by stomach acid backing up into the esophagus (reflux). Reflux can be triggered by: Changing hormone levels during pregnancy. Large meals. Certain foods and beverages. Increased acid in the stomach. What increases the risk? You are more likely to develop this condition if: You had heartburn before pregnancy. You have had at least two previous pregnancies. You are overweight or obese. Risk for heartburn increases as your baby  grows and stretches your uterus. This often happens in the last trimester of pregnancy when the baby gains the most weight. What are the signs or symptoms? Symptoms of this condition include: Burning pain in the chest or lower throat. A bitter taste in the mouth. Coughing. Problems swallowing. Vomiting. A hoarse voice. Asthma. Symptoms may get worse when you lie down or bend over. Symptoms are often worse at  night. How is this diagnosed? This condition is diagnosed based on: Your medical history. Your symptoms. A decrease or relief of symptoms when taking heartburn medicine or making lifestyle changes. A procedure to view the stomach and esophagus using a tube that has a light and camera (endoscopy). How is this treated? Treatment for this condition depends on how severe your symptoms are. Your health care provider may recommend: Over-the-counter medicines for mild heartburn, such as antacids or acid reducers. Prescription medicines to decrease stomach acid or to protect your stomach lining. Changes in your diet, such as smaller meals eaten more often. Raising the head of your bed higher than the foot of the bed. This helps stop stomach acid from backing up into the esophagus when you are lying down. Follow these instructions at home: Eating and drinking Do not drink alcohol during your pregnancy. Identify foods and beverages that make your symptoms worse and avoid them. Avoid drinking large amounts of liquid with your meals. Avoid eating 2-3 hours before bedtime. Avoid lying down for at least 1 hour after you eat. Do not exercise right after you eat. Beverages to avoid Coffee and tea, with or without caffeine. Energy and sports drinks. Carbonated drinks or sodas. Citrus fruit juices. Foods to avoid Spicy or acidic food, such as: Peppers, chili powder, curry powder, vinegar, hot sauces, and barbecue sauce. Citrus fruits, such as oranges, lemons, and limes. Tomato-based foods, such as red sauce, chili, and salsa. High-fat food, such as: Hot dogs, precooked or cured meat, sausage, ham, and bacon. Whole milk, butter, and cheese. Fried and fatty foods, such as donuts, french fries, potato chips, and high-fat dressings. Chocolate and cocoa. Mint. Medicines Take over-the-counter and prescription medicines only as told by your health care provider. Do not take aspirin or NSAIDs, such as  ibuprofen, unless your health care provider tells you to. You may be instructed to avoid medicines that contain sodium bicarbonate. General instructions If directed, raise the head of your bed about 6 inches (15 cm) by putting blocks under the legs. Sleeping with more pillows is not effective because it only changes the position of your head. Do not use any products that contain nicotine or tobacco. These products include cigarettes, chewing tobacco, and vaping devices, such as e-cigarettes. If you need help quitting, ask your health care provider. Wear loose-fitting clothing. Try to reduce your stress with yoga or meditation. If you need help managing stress, ask your health care provider. Maintain a healthy weight. If you are overweight, work with your health care provider to safely manage your weight. Where to find more information American Pregnancy Association: americanpregnancy.org Contact a health care provider if: Your symptoms last for 2 weeks or more, or you develop new symptoms. You do not improve with treatment. You have unexplained weight loss. You have difficulty swallowing. You make loud sounds when you breathe (wheeze). You have a cough that does not go away. You have nausea or vomiting that does not get better with treatment. You have pain in your abdomen. Your stool is bloody or black. You have pain when  swallowing. Get help right away if: You have severe chest pain that spreads to your arm, neck, or jaw. You feel sweaty, dizzy, or light-headed. You have shortness of breath. You vomit, and your vomit looks like blood or coffee grounds. These symptoms may be an emergency. Get help right away. Call 911. Do not wait to see if the symptoms will go away. Do not drive yourself to the hospital. This information is not intended to replace advice given to you by your health care provider. Make sure you discuss any questions you have with your health care provider. Document  Revised: 10/01/2021 Document Reviewed: 10/01/2021 Elsevier Patient Education  [place] of Pregnancy  The third trimester of pregnancy is from week 28 through week 35. This is months 7 through 9. The third trimester is a time when the unborn baby (fetus) is growing rapidly. At the end of the ninth month, the fetus is about 20 inches long and weighs 6-10 pounds. Body changes during your third trimester During the third trimester, your body will continue to go through many changes. The changes vary and generally return to normal after your baby is born. Physical changes Your weight will continue to increase. You can expect to gain 25-35 pounds (11-16 kg) by the end of the pregnancy if you begin pregnancy at a normal weight. If you are underweight, you can expect to gain 28-40 lb (about 13-18 kg), and if you are overweight, you can expect to gain 15-25 lb (about 7-11 kg). You may begin to get stretch marks on your hips, abdomen, and breasts. Your breasts will continue to grow and may hurt. A yellow fluid (colostrum) may leak from your breasts. This is the first milk you are producing for your baby. You may have changes in your hair. These can include thickening of your hair, rapid growth, and changes in texture. Some people also have hair loss during or after pregnancy, or hair that feels dry or thin. Your belly button may stick out. You may notice more swelling in your hands, face, or ankles. Health changes You may have heartburn. You may have constipation. You may develop hemorrhoids. You may develop swollen, bulging veins (varicose veins) in your legs. You may have increased body aches in the pelvis, back, or thighs. This is due to weight gain and increased hormones that are relaxing your joints. You may have increased tingling or numbness in your hands, arms, and legs. The skin on your abdomen may also feel numb. You may feel short of breath because of your expanding  uterus. Other changes You may urinate more often because the fetus is moving lower into your pelvis and pressing on your bladder. You may have more problems sleeping. This may be caused by the size of your abdomen, an increased need to urinate, and an increase in your body's metabolism. You may notice the fetus "dropping," or moving lower in your abdomen (lightening). You may have increased vaginal discharge. You may notice that you have pain around your pelvic bone as your uterus distends. Follow these instructions at home: Medicines Follow your health care provider's instructions regarding medicine use. Specific medicines may be either safe or unsafe to take during pregnancy. Do not take any medicines unless approved by your health care provider. Take a prenatal vitamin that contains at least 600 micrograms (mcg) of folic acid. Eating and drinking Eat a healthy diet that includes fresh fruits and vegetables, whole grains, good sources of protein such as  meat, eggs, or tofu, and low-fat dairy products. Avoid raw meat and unpasteurized juice, milk, and cheese. These carry germs that can harm you and your baby. Eat 4 or 5 small meals rather than 3 large meals a day. You may need to take these actions to prevent or treat constipation: Drink enough fluid to keep your urine pale yellow. Eat foods that are high in fiber, such as beans, whole grains, and fresh fruits and vegetables. Limit foods that are high in fat and processed sugars, such as fried or sweet foods. Activity Exercise only as directed by your health care provider. Most people can continue their usual exercise routine during pregnancy. Try to exercise for 30 minutes at least 5 days a week. Stop exercising if you experience contractions in the uterus. Stop exercising if you develop pain or cramping in the lower abdomen or lower back. Avoid heavy lifting. Do not exercise if it is very hot or humid or if you are at a high altitude. If  you choose to, you may continue to have sex unless your health care provider tells you not to. Relieving pain and discomfort Take frequent breaks and rest with your legs raised (elevated) if you have leg cramps or low back pain. Take warm sitz baths to soothe any pain or discomfort caused by hemorrhoids. Use hemorrhoid cream if your health care provider approves. Wear a supportive bra to prevent discomfort from breast tenderness. If you develop varicose veins: Wear support hose as told by your health care provider. Elevate your feet for 15 minutes, 3-4 times a day. Limit salt in your diet. Safety Talk to your health care provider before traveling far distances. Do not use hot tubs, steam rooms, or saunas. Wear your seat belt at all times when driving or riding in a car. Talk with your health care provider if someone is verbally or physically abusive to you. Preparing for birth To prepare for the arrival of your baby: Take prenatal classes to understand, practice, and ask questions about labor and delivery. Visit the hospital and tour the maternity area. Purchase a rear-facing car seat and make sure you know how to install it in your car. Prepare the baby's room or sleeping area. Make sure to remove all pillows and stuffed animals from the baby's crib to prevent suffocation. General instructions Avoid cat litter boxes and soil used by cats. These carry germs that can cause birth defects in the baby. If you have a cat, ask someone to clean the litter box for you. Do not douche or use tampons. Do not use scented sanitary pads. Do not use any products that contain nicotine or tobacco, such as cigarettes, e-cigarettes, and chewing tobacco. If you need help quitting, ask your health care provider. Do not use any herbal remedies, illegal drugs, or medicines that were not prescribed to you. Chemicals in these products can harm your baby. Do not drink alcohol. You will have more frequent prenatal  exams during the third trimester. During a routine prenatal visit, your health care provider will do a physical exam, perform tests, and discuss your overall health. Keep all follow-up visits. This is important. Where to find more information American Pregnancy Association: americanpregnancy.[place] and Gynecologists: PoolDevices.com.pt Office on [employer] Health: KeywordPortfolios.com.br Contact a health care provider if you have: A fever. Mild pelvic cramps, pelvic pressure, or nagging pain in your abdominal area or lower back. Vomiting or diarrhea. Bad-smelling vaginal discharge or foul-smelling urine. Pain when you urinate. A  headache that does not go away when you take medicine. Visual changes or see spots in front of your eyes. Get help right away if: Your water breaks. You have regular contractions less than 5 minutes apart. You have spotting or bleeding from your vagina. You have severe abdominal pain. You have difficulty breathing. You have chest pain. You have fainting spells. You have not felt your baby move for the time period told by your health care provider. You have new or increased pain, swelling, or redness in an arm or leg. Summary The third trimester of pregnancy is from week 28 through week 40 (months 7 through 9). You may have more problems sleeping. This can be caused by the size of your abdomen, an increased need to urinate, and an increase in your body's metabolism. You will have more frequent prenatal exams during the third trimester. Keep all follow-up visits. This is important. This information is not intended to replace advice given to you by your health care provider. Make sure you discuss any questions you have with your health care provider. Document Revised: 09/28/2019 Document Reviewed: 08/04/2019 Elsevier Patient Education  [place].    If you have been instructed to have an in-person  evaluation today at a local Urgent Care facility, please use the link below. It will take you to a list of all of our available [facility], including address, phone number and hours of operation. Please do not delay care.  [facility]  If you or a family member do not have a primary care provider, use the link below to schedule a visit and establish care. When you choose a [facility] primary care physician or advanced practice provider, you gain a long-term partner in health. Find a Primary Care Provider  Learn more about [facility]'s in-office and virtual care options: [facility]

## 2022-06-13 NOTE — Progress Notes (Signed)
For the safety of you and your child, I recommend a face to face office visit with a health care provider.  Many mothers need to take medicines during their pregnancy and while nursing.  Almost all medicines pass into the breast milk in small quantities.  Most are generally considered safe for a mother to take but some medicines must be avoided.  After reviewing your E-Visit request, I recommend that you consult your OB/GYN or pediatrician for medical advice in relation to your condition and prescription medications while pregnant or breastfeeding.  NOTE:  There will be NO CHARGE for this eVisit  If you are having a true medical emergency please call 911.    For an urgent face to face visit, [facility] has six urgent care centers for your convenience:     [facility] [place] [facility] [place] Get Driving Directions [id number] [address] [place], [place] [address]    [employer] [facility] [facility]) Get Driving Directions [id number] [place], [place] [address]  [facility] [place] ([place]) Get Driving Directions [id number] [address] [place],  [place]  [address]  [facility] Get Driving Directions [id number] [address] [place], [place], [place] [address]   [facility] Get Driving Directions  [id number] [address] [place], [place] [address]   [facility] [place] Get Driving Directions [id number] [address], [place], [place] [address]  Your MyChart E-visit questionnaire answers were reviewed by a board certified advanced clinical practitioner to complete your personal care plan based on your specific symptoms.  Thank you for using e-Visits.   I have spent 5 minutes in review of e-visit questionnaire, review and updating patient chart, medical decision  making and response to patient.   Mar Daring, PA-C

## 2022-06-16 ENCOUNTER — Encounter: Payer: Self-pay | Admitting: Obstetrics and Gynecology

## 2022-06-16 ENCOUNTER — Other Ambulatory Visit: Payer: BC Managed Care – PPO

## 2022-06-16 ENCOUNTER — Encounter: Payer: Self-pay | Admitting: Obstetrics

## 2022-06-16 ENCOUNTER — Ambulatory Visit (INDEPENDENT_AMBULATORY_CARE_PROVIDER_SITE_OTHER): Payer: BC Managed Care – PPO | Admitting: Obstetrics and Gynecology

## 2022-06-16 VITALS — BP 115/83 | HR 97 | Wt 148.8 lb

## 2022-06-16 DIAGNOSIS — Z3A29 29 weeks gestation of pregnancy: Secondary | ICD-10-CM

## 2022-06-16 DIAGNOSIS — R112 Nausea with vomiting, unspecified: Secondary | ICD-10-CM

## 2022-06-16 DIAGNOSIS — Z98891 History of uterine scar from previous surgery: Secondary | ICD-10-CM

## 2022-06-16 DIAGNOSIS — O26899 Other specified pregnancy related conditions, unspecified trimester: Secondary | ICD-10-CM

## 2022-06-16 DIAGNOSIS — Z8759 Personal history of other complications of pregnancy, childbirth and the puerperium: Secondary | ICD-10-CM

## 2022-06-16 DIAGNOSIS — O34211 Maternal care for low transverse scar from previous cesarean delivery: Secondary | ICD-10-CM

## 2022-06-16 DIAGNOSIS — O09899 Supervision of other high risk pregnancies, unspecified trimester: Secondary | ICD-10-CM

## 2022-06-16 DIAGNOSIS — O360930 Maternal care for other rhesus isoimmunization, third trimester, not applicable or unspecified: Secondary | ICD-10-CM | POA: Diagnosis not present

## 2022-06-16 DIAGNOSIS — Z141 Cystic fibrosis carrier: Secondary | ICD-10-CM

## 2022-06-16 DIAGNOSIS — Z3480 Encounter for supervision of other normal pregnancy, unspecified trimester: Secondary | ICD-10-CM | POA: Diagnosis not present

## 2022-06-16 DIAGNOSIS — O360933 Maternal care for other rhesus isoimmunization, third trimester, fetus 3: Secondary | ICD-10-CM | POA: Diagnosis not present

## 2022-06-16 DIAGNOSIS — Z6791 Unspecified blood type, Rh negative: Secondary | ICD-10-CM

## 2022-06-16 DIAGNOSIS — K219 Gastro-esophageal reflux disease without esophagitis: Secondary | ICD-10-CM

## 2022-06-16 DIAGNOSIS — O0993 Supervision of high risk pregnancy, unspecified, third trimester: Secondary | ICD-10-CM

## 2022-06-16 MED ORDER — OMEPRAZOLE 20 MG PO CPDR: 20.0000 mg | DELAYED_RELEASE_CAPSULE | Freq: Every day | ORAL | 2 refills | Status: DC

## 2022-06-16 MED ORDER — RHO D IMMUNE GLOBULIN 1500 UNIT/2ML IJ SOSY
300.0000 ug | PREFILLED_SYRINGE | Freq: Once | INTRAMUSCULAR | Status: AC
  Administered 2022-06-16: 300 ug via INTRAMUSCULAR

## 2022-06-16 NOTE — Progress Notes (Signed)
Pt presents for ROB visit. Pt report 2 episodes of severe nausea and vomiting. This is a new issue. Requesting new Rx for omeprazole.

## 2022-06-16 NOTE — Patient Instructions (Signed)
Maalox Mylanta

## 2022-06-16 NOTE — Progress Notes (Signed)
   PRENATAL VISIT NOTE  Subjective:  Kelly Lane is a 27 y.o. [phone] at 47w1dbeing seen today for ongoing prenatal care.  She is currently monitored for the following issues for this high-risk pregnancy and has Rh negative state in antepartum period; H/O emergency cesarean section; Supervision of other normal pregnancy, antepartum; Cystic fibrosis carrier, antepartum; and H/O severe pre-eclampsia on their problem list.  Patient reports  nausea and vomiting around Jan 28th and again last Thurs/Fri. Now tolerating PO. No fevers/chills. +Loose stools. 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  Contractions: Not present. Vag. Bleeding: None.  Movement: Present. Denies leaking of fluid.   The following portions of the patient's history were reviewed and updated as appropriate: allergies, current medications, past family history, past medical history, past social history, past surgical history and problem list.   Objective:   Vitals:   06/16/22 0938  BP: 115/83  Pulse: 97  Weight: 148 lb 12.8 oz (67.5 kg)    Fetal Status:   Fundal Height: 28 cm Movement: Present     General:  Alert, oriented and cooperative. Patient is in no acute distress.  Skin: Skin is warm and dry. No rash noted.   Cardiovascular: Normal heart rate noted  Respiratory: Normal respiratory effort, no problems with respiration noted  Abdomen: Soft, gravid, appropriate for gestational age.  Pain/Pressure: Present      Assessment and Plan:  Pregnancy: G3P0111 at 261w1d. Supervision of other normal pregnancy, antepartum - Rhogam & tdap today - Considering flu shot - HIV antibody (with reflex) - RPR - CBC - Glucose Tolerance, 2 Hours w/1 Hour  2. Rh negative state in antepartum period S/p rhogam today  3. H/O emergency cesarean section Undecided MOD VBAC calc 66.7% - discussed that TOLAC is reasonable Briefly reviewed options with pros/cons and risks of each.  Gave copy of TOLAC consent for pt to  review & sign for next visit if she opts for TOLAC  4. H/O severe pre-eclampsia Not taking ldasa Normotensive today  5. Cystic fibrosis carrier, antepartum Awaiting partner's results  6. Gastroesophageal reflux disease without esophagitis 7. Nausea and vomiting, unspecified vomiting type Discussed likely GI bug. Reviewed warning si/sx and MAU precautions Discussed maalox/mylanta prn while getting prilosec to work  - omeprazole (PRILOSEC) 20 MG capsule; Take 1 capsule (20 mg total) by mouth daily.  Dispense: 30 capsule; Refill: 2  Return in about 2 weeks (around 06/30/2022) for return OB at 31 weeks.  Future Appointments  Date Time Provider De[place]ta2/26/2024  3:30 PM WeLuvenia ReddenPA-C [facility] None  07/14/2022  1:50 PM FoInez CatalinaMD [facility] None  07/24/2022  9:15 AM [facility] NURSE [facility]21[facility]  9:30 AM [facility] US[facility]/25[facility]2024  1:50 PM FoInez CatalinaMD [facility]  08/04/2022  1:30 PM FoInez CatalinaMD [facility]  08/11/2022  1:30 PM Constant, PeVickii ChafeMD [facility]  08/18/2022  1:30 PM [place]-Kirby, LiKathie DikeCNM [facility] None  08/25/2022  1:30 PM Constant, PeVickii ChafeMD [facility]   KyInez CatalinaMD

## 2022-06-17 LAB — CBC
Hematocrit: 35.4 % (ref 34.0–46.6)
Hemoglobin: 12.4 g/dL (ref 11.1–15.9)
MCH: 34 pg — ABNORMAL HIGH (ref 26.6–33.0)
MCHC: 35 g/dL (ref 31.5–35.7)
MCV: 97 fL (ref 79–97)
Platelets: 253 10*3/uL (ref 150–450)
RBC: 3.65 x10E6/uL — ABNORMAL LOW (ref 3.77–5.28)
RDW: 11.9 % (ref 11.7–15.4)
WBC: 6.4 10*3/uL (ref 3.4–10.8)

## 2022-06-17 LAB — RPR: RPR Ser Ql: NONREACTIVE

## 2022-06-17 LAB — GLUCOSE TOLERANCE, 2 HOURS W/ 1HR
Glucose, 1 hour: 124 mg/dL (ref 70–179)
Glucose, 2 hour: 115 mg/dL (ref 70–152)
Glucose, Fasting: 69 mg/dL — ABNORMAL LOW (ref 70–91)

## 2022-06-17 LAB — HIV ANTIBODY (ROUTINE TESTING W REFLEX): HIV Screen 4th Generation wRfx: NONREACTIVE

## 2022-06-23 ENCOUNTER — Inpatient Hospital Stay (HOSPITAL_COMMUNITY)
Admission: AD | Admit: 2022-06-23 | Discharge: 2022-06-23 | Disposition: A | Discharge status: Home / Self Care | Payer: BC Managed Care – PPO | Attending: Family Medicine | Admitting: Family Medicine

## 2022-06-23 ENCOUNTER — Encounter (HOSPITAL_COMMUNITY): Payer: Self-pay | Admitting: Family Medicine

## 2022-06-23 DIAGNOSIS — R102 Pelvic and perineal pain: Secondary | ICD-10-CM

## 2022-06-23 DIAGNOSIS — O26893 Other specified pregnancy related conditions, third trimester: Secondary | ICD-10-CM | POA: Insufficient documentation

## 2022-06-23 DIAGNOSIS — Z3A30 30 weeks gestation of pregnancy: Secondary | ICD-10-CM

## 2022-06-23 DIAGNOSIS — R109 Unspecified abdominal pain: Secondary | ICD-10-CM | POA: Diagnosis not present

## 2022-06-23 DIAGNOSIS — Z79899 Other long term (current) drug therapy: Secondary | ICD-10-CM | POA: Diagnosis not present

## 2022-06-23 DIAGNOSIS — K219 Gastro-esophageal reflux disease without esophagitis: Secondary | ICD-10-CM | POA: Diagnosis not present

## 2022-06-23 DIAGNOSIS — Z3689 Encounter for other specified antenatal screening: Secondary | ICD-10-CM

## 2022-06-23 DIAGNOSIS — O99613 Diseases of the digestive system complicating pregnancy, third trimester: Secondary | ICD-10-CM | POA: Diagnosis not present

## 2022-06-23 LAB — URINALYSIS, ROUTINE W REFLEX MICROSCOPIC
Bilirubin Urine: NEGATIVE
Glucose, UA: NEGATIVE mg/dL
Hgb urine dipstick: NEGATIVE
Ketones, ur: NEGATIVE mg/dL
Leukocytes,Ua: NEGATIVE
Nitrite: NEGATIVE
Protein, ur: NEGATIVE mg/dL
Specific Gravity, Urine: >1.03 — ABNORMAL HIGH (ref 1.005–1.030)
pH: 6 (ref 5.0–8.0)

## 2022-06-23 LAB — WET PREP, GENITAL
Clue Cells Wet Prep HPF POC: NONE SEEN
Sperm: NONE SEEN
Trich, Wet Prep: NONE SEEN
WBC, Wet Prep HPF POC: >=10 — AB (ref <10)
Yeast Wet Prep HPF POC: NONE SEEN

## 2022-06-23 NOTE — MAU Provider Note (Signed)
Chief Complaint:  Abdominal Pain  HPI: Kelly Lane is a 27 y.o. [phone] at 71w1dwho presents to maternity admissions reporting cramping, worse with walking/movement, feels like baby is really low. No other physical complaints.  Pregnancy Course: Receives care at [facility], prenatal records reviewed.  Past Medical History:  Diagnosis Date   Anxiety    Endometriosis 07/2015   GERD (gastroesophageal reflux disease)    NO MEDS   Ovarian cyst 07/2015   OB History  Gravida Para Term Preterm AB Living  3 1 0 1 1 1  $ SAB IAB Ectopic Multiple Live Births  0 1 0 0 1    # Outcome Date GA Lbr Len/2nd Weight Sex Delivery Anes PTL Lv  3 Current           2 Preterm 09/20/20 [id number] lb 9.1 oz (1.62 kg) M CS-LTranv Spinal  LIV  1 IAB 01/19/18 9w65w0d       Past Surgical History:  Procedure Laterality Date   CESAREAN SECTION N/A 09/20/2020   Procedure: CESAREAN SECTION;  Surgeon: AnyOsborne OmanD;  [facility]: [facility];  Service: Obstetrics;  Laterality: N/A;   LAPAROSCOPIC OVARIAN CYSTECTOMY Right 07/17/2015   Procedure: EXCISION OF ENDOMETRIOSIS;  Surgeon: RobGae DryD;  Location: [facility] ORS;  Service: Gynecology;  Laterality: Right;   LAPAROSCOPY N/A 07/17/2015   Procedure: LAPAROSCOPY OPERATIVE;  Surgeon: RobGae DryD;  Location: [facility] ORS;  Service: Gynecology;  Laterality: N/A;   Family History  Problem Relation Age of Onset   Hypertension Mother    Asthma Neg Hx    Cancer Neg Hx    Diabetes Neg Hx    Heart disease Neg Hx    Social History   Tobacco Use   Smoking status: Never   Smokeless tobacco: Never  Vaping Use   Vaping Use: Never used  Substance Use Topics   Alcohol use: Not Currently    Comment: every six months maybe 1 drink   Drug use: No   Allergies  Allergen Reactions   Compazine [Prochlorperazine] Anxiety   Medications Prior to Admission  Medication Sig Dispense Refill Last Dose   Prenatal MV & Min w/FA-DHA (PRENATAL GUMMIES PO) Take 1 tablet  by mouth daily.   06/23/2022   scopolamine (TRANSDERM-SCOP) 1 MG/3DAYS Place 1 patch (1.5 mg total) onto the skin every 3 (three) days. 10 patch 12 06/22/2022   aspirin 81 MG chewable tablet Chew 1 tablet (81 mg total) by mouth daily. (Patient not taking: Reported on 05/15/2022) 30 tablet 5    Blood Pressure Monitoring (BLOOD PRESSURE KIT) DEVI 1 kit by Does not apply route once a week. 1 each 0    omeprazole (PRILOSEC) 20 MG capsule Take 1 capsule (20 mg total) by mouth daily. 30 capsule 2    ondansetron (ZOFRAN-ODT) 8 MG disintegrating tablet Take 1 tablet (8 mg total) by mouth every 8 (eight) hours as needed for nausea or vomiting. (Patient not taking: Reported on 06/12/2022) 20 tablet 2    promethazine (PHENERGAN) 25 MG tablet Take 0.5-1 tablets (12.5-25 mg total) by mouth every 6 (six) hours as needed for nausea or vomiting. 30 tablet 0    I have reviewed patient's Past Medical Hx, Surgical Hx, Family Hx, Social Hx, medications and allergies.   ROS:  Pertinent items noted in HPI and remainder of comprehensive ROS otherwise negative.   Physical Exam  Patient Vitals for the past 24 hrs:  BP Temp Temp  src Pulse Resp SpO2 Height Weight  06/23/22 1830 115/74 -- -- (!) 109 -- 99 % -- --  06/23/22 1812 113/79 98.4 F (36.9 C) Oral (!) 107 17 97 % 5' 2"$  (1.575 m) 151 lb 6.4 oz (68.7 kg)   Constitutional: Well-developed, well-nourished female in no acute distress.  Cardiovascular: normal rate & rhythm, warm and well-perfused Respiratory: normal effort, no problems with respiration noted GI: Abd soft, non-tender, gravid appropriate for gestational age MS: Extremities nontender, no edema, normal ROM Neurologic: Alert and oriented x 4.  GU: no CVA tenderness Pelvic: NEFG, physiologic discharge, no blood  Dilation: Closed Exam by:: Gaylan Gerold, CNM  Fetal Tracing: reactive Baseline: 145 Variability: moderate Accelerations: 10x10 (appropriate for gestational age) Decelerations: none Toco: UI  with occasional contractions  Vigorous movement heard while monitoring.   Labs: Results for orders placed or performed during the hospital encounter of 06/23/22 (from the past 24 hour(s))  Urinalysis, Routine w reflex microscopic -Urine, Clean Catch     Status: Abnormal   Collection Time: 06/23/22  6:16 PM  Result Value Ref Range   Color, Urine YELLOW YELLOW   APPearance CLEAR CLEAR   Specific Gravity, Urine >1.030 (H) 1.005 - 1.030   pH 6.0 5.0 - 8.0   Glucose, UA NEGATIVE NEGATIVE mg/dL   Hgb urine dipstick NEGATIVE NEGATIVE   Bilirubin Urine NEGATIVE NEGATIVE   Ketones, ur NEGATIVE NEGATIVE mg/dL   Protein, ur NEGATIVE NEGATIVE mg/dL   Nitrite NEGATIVE NEGATIVE   Leukocytes,Ua NEGATIVE NEGATIVE  Wet prep, genital     Status: Abnormal   Collection Time: 06/23/22  7:35 PM  Result Value Ref Range   Yeast Wet Prep HPF POC NONE SEEN NONE SEEN   Trich, Wet Prep NONE SEEN NONE SEEN   Clue Cells Wet Prep HPF POC NONE SEEN NONE SEEN   WBC, Wet Prep HPF POC >=10 (A) <10   Sperm NONE SEEN    Imaging:  Pt informed that the ultrasound is considered a limited OB ultrasound and is not intended to be a complete ultrasound exam.  Patient also informed that the ultrasound is not being completed with the intent of assessing for fetal or placental anomalies or any pelvic abnormalities.  Explained that the purpose of today's ultrasound is to assess for  presentation.  Patient acknowledges the purpose of the exam and the limitations of the study.    Baby confirmed to be head down and vigorous movement could be seen as baby rooted. Both hands and one foot near the face, baby not engaged in the pelvis.  MAU Course: Orders Placed This Encounter  Procedures   Wet prep, genital   Urinalysis, Routine w reflex microscopic -Urine, Clean Catch   Discharge patient   No orders of the defined types were placed in this encounter.  Monitors applied. Swabs collected and cervical exam performed which showed  cervix to be closed/thick/posterior with fetal parts ballotable and indeterminate which presentation. Pt declined any pain meds.  U/S brought to bedside to confirm presentation, see above. Explained that at this stage in pregnancy, baby can settle into vertex presentation causing a few days of contractions as uterus adjusts. Wet prep clear, no cervical change, pt stable for discharge. Discussed positioning for assisting fetal positioning and resolved practice contractions. Also encouraged increased hydration and an Epsom salt bath.  MDM: Low  Assessment: 1. Pelvic pressure in pregnancy, third trimester   2. [redacted] weeks gestation of pregnancy   3. NST (non-stress test) reactive  Plan: Discharge home in stable condition with return precautions    [place] [facility] [place] [facility].   Specialty: Obstetrics and Gynecology Why: as scheduled for ongoing prenatal care Contact information: [address] [place] [phone]                Allergies as of 06/23/2022       Reactions   Compazine [prochlorperazine] Anxiety        Medication List     STOP taking these medications    ondansetron 8 MG disintegrating tablet Commonly known as: ZOFRAN-ODT       TAKE these medications    aspirin 81 MG chewable tablet Chew 1 tablet (81 mg total) by mouth daily.   Blood Pressure Kit Devi 1 kit by Does not apply route once a week.   omeprazole 20 MG capsule Commonly known as: PRILOSEC Take 1 capsule (20 mg total) by mouth daily.   PRENATAL GUMMIES PO Take 1 tablet by mouth daily.   promethazine 25 MG tablet Commonly known as: PHENERGAN Take 0.5-1 tablets (12.5-25 mg total) by mouth every 6 (six) hours as needed for nausea or vomiting.   scopolamine 1 MG/3DAYS Commonly known as: TRANSDERM-SCOP Place 1 patch (1.5 mg total) onto the skin every 3 (three) days.       Gaylan Gerold, CNM, MSN, [place] Certified Nurse Midwife, [facility]

## 2022-06-23 NOTE — MAU Note (Signed)
Kelly Lane is a 27 y.o. at 49w1dhere in MAU reporting: been having like cramps and stuff.  Talked to the office, was told to go to the hosp.  Couldn't sleep, woke up her.  Cramps intensified during the day. No bleeding or LOF.  Reports +FM Onset of complaint: last night  Pain score: 7 Vitals:   06/23/22 1812  BP: 113/79  Pulse: (!) 107  Resp: 17  Temp: 98.4 F (36.9 C)  SpO2: 97%     FHT:154 Lab orders placed from triage:  urine

## 2022-06-24 ENCOUNTER — Telehealth: Payer: Self-pay

## 2022-06-24 LAB — GC/CHLAMYDIA PROBE AMP (~~LOC~~) NOT AT ARMC
Chlamydia: NEGATIVE
Comment: NEGATIVE
Comment: NORMAL
Neisseria Gonorrhea: NEGATIVE

## 2022-06-24 NOTE — Telephone Encounter (Signed)
Patient left a message. She has questions about an ultrasound. LVM for pt to c/b if she needs assistance.

## 2022-06-30 ENCOUNTER — Encounter: Payer: Self-pay | Admitting: Medical

## 2022-06-30 ENCOUNTER — Ambulatory Visit (INDEPENDENT_AMBULATORY_CARE_PROVIDER_SITE_OTHER): Payer: BC Managed Care – PPO | Admitting: Medical

## 2022-06-30 VITALS — BP 114/78 | HR 96

## 2022-06-30 DIAGNOSIS — O09893 Supervision of other high risk pregnancies, third trimester: Secondary | ICD-10-CM

## 2022-06-30 DIAGNOSIS — Z3480 Encounter for supervision of other normal pregnancy, unspecified trimester: Secondary | ICD-10-CM

## 2022-06-30 DIAGNOSIS — O09899 Supervision of other high risk pregnancies, unspecified trimester: Secondary | ICD-10-CM

## 2022-06-30 DIAGNOSIS — O34211 Maternal care for low transverse scar from previous cesarean delivery: Secondary | ICD-10-CM

## 2022-06-30 DIAGNOSIS — Z3A31 31 weeks gestation of pregnancy: Secondary | ICD-10-CM

## 2022-06-30 DIAGNOSIS — Z8759 Personal history of other complications of pregnancy, childbirth and the puerperium: Secondary | ICD-10-CM

## 2022-06-30 DIAGNOSIS — Z141 Cystic fibrosis carrier: Secondary | ICD-10-CM

## 2022-06-30 DIAGNOSIS — O26899 Other specified pregnancy related conditions, unspecified trimester: Secondary | ICD-10-CM

## 2022-06-30 DIAGNOSIS — Z6791 Unspecified blood type, Rh negative: Secondary | ICD-10-CM

## 2022-06-30 DIAGNOSIS — Z98891 History of uterine scar from previous surgery: Secondary | ICD-10-CM

## 2022-06-30 NOTE — Progress Notes (Signed)
ROB 31.[redacted] wks GA Reports watery discharge today Reports pressure and cramping at times. Denies UC's.

## 2022-07-02 NOTE — Progress Notes (Signed)
   PRENATAL VISIT NOTE  Subjective:  Kelly Lane is a 27 y.o. [phone] at 29w3dbeing seen today for ongoing prenatal care.  She is currently monitored for the following issues for this high-risk pregnancy and has Rh negative state in antepartum period; H/O emergency cesarean section; Supervision of other normal pregnancy, antepartum; Cystic fibrosis carrier, antepartum; and H/O severe pre-eclampsia on their problem list.  Patient reports backache and occasional contractions.  Contractions: Irritability. Vag. Bleeding: None.  Movement: Present. Denies leaking of fluid.   The following portions of the patient's history were reviewed and updated as appropriate: allergies, current medications, past family history, past medical history, past social history, past surgical history and problem list.   Objective:   Vitals:   06/30/22 1558  BP: 114/78  Pulse: 96    Fetal Status: Fetal Heart Rate (bpm): 145 Fundal Height: 30 cm Movement: Present     General:  Alert, oriented and cooperative. Patient is in no acute distress.  Skin: Skin is warm and dry. No rash noted.   Cardiovascular: Normal heart rate noted  Respiratory: Normal respiratory effort, no problems with respiration noted  Abdomen: Soft, gravid, appropriate for gestational age.  Pain/Pressure: Present     Pelvic: Cervical exam deferred        Extremities: Normal range of motion.  Edema: None  Mental Status: Normal mood and affect. Normal behavior. Normal judgment and thought content.   Assessment and Plan:  Pregnancy: [id number] 361w3d. Supervision of other normal pregnancy, antepartum - Normal 28 week labs reviewed   2. H/O emergency cesarean section - Desires TOLAC, consent signed today   3. H/O severe pre-eclampsia - Normotensive today  4. Cystic fibrosis carrier, antepartum  5. Rh negative state in antepartum period - Rhogam given 2/12  6. [redacted] weeks gestation of pregnancy  Preterm labor symptoms and general  obstetric precautions including but not limited to vaginal bleeding, contractions, leaking of fluid and fetal movement were reviewed in detail with the patient. Please refer to After Visit Summary for other counseling recommendations.   Return in about 2 weeks (around 07/14/2022) [facility], any provider.  Future Appointments  Date Time Provider [place]3/03/2023  1:50 PM FoInez CatalinaMD [facility] None  07/24/2022  9:15 AM [facility] NURSE [facility]07/24/2022  9:30 AM [facility]  1:50 PM FoInez CatalinaMD [facility]  08/04/2022  1:30 PM FoInez CatalinaMD [facility] None  08/11/2022  1:30 PM Constant, PeVickii ChafeMD [facility] None  08/18/2022  1:30 PM Leftwich-Kirby, LiKathie DikeCNM [facility] None  08/25/2022  1:30 PM Constant, PeVickii ChafeMD [facility] None  09/01/2022  1:30 PM Constant, PeVickii ChafeMD [facility] None    JuKerry HoughPA-C

## 2022-07-11 ENCOUNTER — Telehealth: Payer: Self-pay

## 2022-07-11 ENCOUNTER — Encounter (HOSPITAL_COMMUNITY): Payer: Self-pay | Admitting: Obstetrics and Gynecology

## 2022-07-11 ENCOUNTER — Ambulatory Visit (HOSPITAL_COMMUNITY): Payer: BC Managed Care – PPO

## 2022-07-11 ENCOUNTER — Inpatient Hospital Stay (HOSPITAL_COMMUNITY)
Admission: AD | Admit: 2022-07-11 | Discharge: 2022-07-11 | Disposition: A | Discharge status: Home / Self Care | Payer: BC Managed Care – PPO | Attending: Obstetrics and Gynecology | Admitting: Obstetrics and Gynecology

## 2022-07-11 DIAGNOSIS — O99513 Diseases of the respiratory system complicating pregnancy, third trimester: Secondary | ICD-10-CM | POA: Diagnosis not present

## 2022-07-11 DIAGNOSIS — Z3A32 32 weeks gestation of pregnancy: Secondary | ICD-10-CM | POA: Diagnosis not present

## 2022-07-11 DIAGNOSIS — Z1152 Encounter for screening for COVID-19: Secondary | ICD-10-CM | POA: Insufficient documentation

## 2022-07-11 DIAGNOSIS — Z28310 Unvaccinated for covid-19: Secondary | ICD-10-CM | POA: Diagnosis not present

## 2022-07-11 DIAGNOSIS — Z79899 Other long term (current) drug therapy: Secondary | ICD-10-CM | POA: Diagnosis not present

## 2022-07-11 DIAGNOSIS — J029 Acute pharyngitis, unspecified: Secondary | ICD-10-CM

## 2022-07-11 DIAGNOSIS — O368130 Decreased fetal movements, third trimester, not applicable or unspecified: Secondary | ICD-10-CM

## 2022-07-11 DIAGNOSIS — Z3689 Encounter for other specified antenatal screening: Secondary | ICD-10-CM

## 2022-07-11 LAB — URINALYSIS, ROUTINE W REFLEX MICROSCOPIC
Bilirubin Urine: NEGATIVE
Glucose, UA: NEGATIVE mg/dL
Hgb urine dipstick: NEGATIVE
Ketones, ur: NEGATIVE mg/dL
Leukocytes,Ua: NEGATIVE
Nitrite: NEGATIVE
Protein, ur: 30 mg/dL — AB
Specific Gravity, Urine: 1.024 (ref 1.005–1.030)
pH: 5 (ref 5.0–8.0)

## 2022-07-11 LAB — RESP PANEL BY RT-PCR (RSV, FLU A&B, COVID)  RVPGX2
Influenza A by PCR: NEGATIVE
Influenza B by PCR: NEGATIVE
Resp Syncytial Virus by PCR: NEGATIVE
SARS Coronavirus 2 by RT PCR: NEGATIVE

## 2022-07-11 LAB — GROUP A STREP BY PCR: Group A Strep by PCR: NOT DETECTED

## 2022-07-11 NOTE — Discharge Instructions (Signed)

## 2022-07-11 NOTE — MAU Provider Note (Cosign Needed Addendum)
History     CSN: [id number]  Arrival date and time: 07/11/22 1752   None     Chief Complaint  Patient presents with   Sore Throat   Decreased Fetal Movement    JAVETTA PITKIN is a 27 y.o. [id number] at 24w5dwho receives care at [facility].  She presents today for sore throat and DFM.  Patient states her sore throat was noted yesterday after onset of cough.  She states cough d/t tickling "like a feather is in my throat."  Patient reports she took benadryl around 7pm and when she woke this morning fetal movement has been decreased, but present. She further states she is not feeling movement as frequent as she sometimes goes up to an hour without movement.  She states she tried eating a cupcake to promote movement.  She also reports eating fish and tea around 2pm. She denies cramping or contractions as well as vaginal concerns. 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   OB History     Gravida  3   Para  1   Term  0   Preterm  1   AB  1   Living  1      SAB  0   IAB  1   Ectopic  0   Multiple  0   Live Births  1           Past Medical History:  Diagnosis Date   Anxiety    Endometriosis 07/2015   GERD (gastroesophageal reflux disease)    NO MEDS   Ovarian cyst 07/2015    Past Surgical History:  Procedure Laterality Date   CESAREAN SECTION N/A 09/20/2020   Procedure: CESAREAN SECTION;  Surgeon: AOsborne Oman MD;  Location: [facility];  Service: Obstetrics;  Laterality: N/A;   LAPAROSCOPIC OVARIAN CYSTECTOMY Right 07/17/2015   Procedure: EXCISION OF ENDOMETRIOSIS;  Surgeon: RGae Dry MD;  Location: [facility] ORS;  Service: Gynecology;  Laterality: Right;   LAPAROSCOPY N/A 07/17/2015   Procedure: LAPAROSCOPY OPERATIVE;  Surgeon: RGae Dry MD;  Location: [facility] ORS;  Service: Gynecology;  Laterality: N/A;    Family History  Problem Relation Age of Onset   Hypertension Mother    Asthma Neg Hx    Cancer Neg Hx     Diabetes Neg Hx    Heart disease Neg Hx     Social History   Tobacco Use   Smoking status: Never   Smokeless tobacco: Never  Vaping Use   Vaping Use: Never used  Substance Use Topics   Alcohol use: Not Currently    Comment: every six months maybe 1 drink   Drug use: No    Allergies:  Allergies  Allergen Reactions   Compazine [Prochlorperazine] Anxiety    Medications Prior to Admission  Medication Sig Dispense Refill Last Dose   Prenatal MV & Min w/FA-DHA (PRENATAL GUMMIES PO) Take 1 tablet by mouth daily.   07/10/2022   aspirin 81 MG chewable tablet Chew 1 tablet (81 mg total) by mouth daily. (Patient not taking: Reported on 05/15/2022) 30 tablet 5    Blood Pressure Monitoring (BLOOD PRESSURE KIT) DEVI 1 kit by Does not apply route once a week. 1 each 0    omeprazole (PRILOSEC) 20 MG capsule Take 1 capsule (20 mg total) by mouth daily. 30 capsule 2    promethazine (PHENERGAN) 25 MG tablet Take 0.5-1 tablets (12.5-25 mg total) by  mouth every 6 (six) hours as needed for nausea or vomiting. (Patient not taking: Reported on 06/30/2022) 30 tablet 0    scopolamine (TRANSDERM-SCOP) 1 MG/3DAYS Place 1 patch (1.5 mg total) onto the skin every 3 (three) days. 10 patch 12     Review of Systems  Constitutional:  Negative for chills and fever.  HENT:  Positive for sore throat. Negative for congestion.   Respiratory:  Positive for cough.   Gastrointestinal:  Negative for abdominal pain, nausea and vomiting.  Genitourinary:  Negative for difficulty urinating, dysuria, vaginal bleeding and vaginal discharge.  Neurological:  Negative for dizziness, light-headedness and headaches.   Physical Exam   Blood pressure 119/83, pulse 95, temperature 98 F (36.7 C), temperature source Oral, resp. rate (!) 24, height '5\' 2"'$  (1.575 m), weight 70.8 kg, last menstrual period 11/18/2021, SpO2 99 %, currently breastfeeding.  Physical Exam Vitals reviewed.  Constitutional:      Appearance: Normal  appearance. She is well-developed.  HENT:     Head: Normocephalic and atraumatic.     Nose: No congestion.     Right Turbinates: Not enlarged or swollen.     Left Turbinates: Not enlarged or swollen.     Mouth/Throat:     Mouth: Mucous membranes are moist.     Pharynx: Posterior oropharyngeal erythema present.     Tonsils: No tonsillar exudate or tonsillar abscesses. 1+ on the right. 1+ on the left.     Comments: Pharynx with erythema and lesions c/w strep.  Eyes:     Conjunctiva/sclera: Conjunctivae normal.  Cardiovascular:     Rate and Rhythm: Normal rate.  Pulmonary:     Effort: Pulmonary effort is normal. No respiratory distress.  Abdominal:     Tenderness: There is no abdominal tenderness.     Comments: Gravid--fundal height appears AGA, Soft, NT   Musculoskeletal:        General: Normal range of motion.     Cervical back: Normal range of motion.  Skin:    General: Skin is warm and dry.  Neurological:     Mental Status: She is alert and oriented to person, place, and time.  Psychiatric:        Mood and Affect: Mood normal.        Behavior: Behavior normal.     Fetal Assessment 135 bpm, Mod Var, -Decels, +Accels Toco: No ctx graphed  MAU Course   Results for orders placed or performed during the hospital encounter of 07/11/22 (from the past 24 hour(s))  Urinalysis, Routine w reflex microscopic -Urine, Clean Catch     Status: Abnormal   Collection Time: 07/11/22  6:20 PM  Result Value Ref Range   Color, Urine YELLOW YELLOW   APPearance HAZY (A) CLEAR   Specific Gravity, Urine 1.024 1.005 - 1.030   pH 5.0 5.0 - 8.0   Glucose, UA NEGATIVE NEGATIVE mg/dL   Hgb urine dipstick NEGATIVE NEGATIVE   Bilirubin Urine NEGATIVE NEGATIVE   Ketones, ur NEGATIVE NEGATIVE mg/dL   Protein, ur 30 (A) NEGATIVE mg/dL   Nitrite NEGATIVE NEGATIVE   Leukocytes,Ua NEGATIVE NEGATIVE   RBC / HPF 0-5 0 - 5 RBC/hpf   WBC, UA 0-5 0 - 5 WBC/hpf   Bacteria, UA RARE (A) NONE SEEN    Squamous Epithelial / HPF 0-5 0 - 5 /HPF   Mucus PRESENT   Resp panel by RT-PCR (RSV, Flu A&B, Covid) Anterior Nasal Swab     Status: None   Collection Time: 07/11/22  6:36 PM   Specimen: Anterior Nasal Swab  Result Value Ref Range   SARS Coronavirus 2 by RT PCR NEGATIVE NEGATIVE   Influenza A by PCR NEGATIVE NEGATIVE   Influenza B by PCR NEGATIVE NEGATIVE   Resp Syncytial Virus by PCR NEGATIVE NEGATIVE  Group A Strep by PCR     Status: None   Collection Time: 07/11/22  8:33 PM   Specimen: Throat; Sterile Swab  Result Value Ref Range   Group A Strep by PCR NOT DETECTED NOT DETECTED   No results found.  MDM PE Labs: Respiratory Panel, Strep Culture/PCR EFM Assessment and Plan  27 year old G3P0111  SIUP at 32.5 weeks Cat I FT Sore Throat DFM  -Exam performed. -Labs ordered. -Discussed using clicker to monitor movement. -NST reactive for GA. -Will await results and reassess.    Maryann Conners MSN, CNM 07/11/2022, 6:53 PM   Reassessment (8:30 PM) -Lab contacted regarding strep.   -Patient updated on need for additional swab.  Agreeable. -Reviewed options if culture returns negative.  -Will place pregnancy safe medication list in AVS. -Patient endorses fetal movement, but feels it is not as prominent. -Reassured that movement will vary especially in setting of maternal illness. -NST remains reassuring. Okay to discontinue.  Reassessment (8:59 PM) -Report given to E. Purcell Nails, NP for assumption of care. -Discharge orders pending.  Maryann Conners MSN, CNM Advanced Practice Provider, [facility]    Resp panel & strep throat swab negative  1. Decreased fetal movements in third trimester, single or unspecified fetus   2. Acute sore throat   3. NST (non-stress test) reactive   4. [redacted] weeks gestation of pregnancy    -OTC meds safe in pregnancy list given. Discussed treatment of sore throat at home.  -Fetal kick counts -Return  precautions  Jorje Guild, NP

## 2022-07-11 NOTE — Telephone Encounter (Signed)
Called patient to follow up regarding after hours call with patient complaining of shortness of breath when changing positions and cough which is resolved with taking cough medication. Patient was advised to go to MAU by after hours nurse.  Attempted tor each out to patient to follow up. No answer. Left message on vm for patient to return call to the office

## 2022-07-11 NOTE — MAU Note (Signed)
.  Kelly Lane is a 27 y.o. at [redacted]w[redacted]d here in MAU reporting: she had a dry cough yesterday and started having a sore throat last night around midnight (7/10). Denies cough now, but does report SOB with activity. She also been having DFM since this morning. Denies VB or LOF. 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  LMP: N/A Onset of complaint: Yesterday Pain score: 7/10 Vitals:   07/11/22 1809 07/11/22 1817  BP: 109/89 119/83  Pulse: 95   Resp: (!) 24   Temp: 98 F (36.7 C)   SpO2: 99%      FHT:140 Lab orders placed from triage: UA

## 2022-07-14 ENCOUNTER — Ambulatory Visit (INDEPENDENT_AMBULATORY_CARE_PROVIDER_SITE_OTHER): Payer: BC Managed Care – PPO | Admitting: Obstetrics and Gynecology

## 2022-07-14 ENCOUNTER — Encounter: Payer: BC Managed Care – PPO | Admitting: Obstetrics and Gynecology

## 2022-07-14 ENCOUNTER — Encounter: Payer: Self-pay | Admitting: Obstetrics and Gynecology

## 2022-07-14 VITALS — BP 124/80 | HR 78 | Wt 158.2 lb

## 2022-07-14 DIAGNOSIS — Z8759 Personal history of other complications of pregnancy, childbirth and the puerperium: Secondary | ICD-10-CM

## 2022-07-14 DIAGNOSIS — Z3480 Encounter for supervision of other normal pregnancy, unspecified trimester: Secondary | ICD-10-CM

## 2022-07-14 DIAGNOSIS — O26893 Other specified pregnancy related conditions, third trimester: Secondary | ICD-10-CM

## 2022-07-14 DIAGNOSIS — R051 Acute cough: Secondary | ICD-10-CM

## 2022-07-14 DIAGNOSIS — Z141 Cystic fibrosis carrier: Secondary | ICD-10-CM

## 2022-07-14 DIAGNOSIS — Z98891 History of uterine scar from previous surgery: Secondary | ICD-10-CM

## 2022-07-14 DIAGNOSIS — Z6791 Unspecified blood type, Rh negative: Secondary | ICD-10-CM

## 2022-07-14 DIAGNOSIS — O09893 Supervision of other high risk pregnancies, third trimester: Secondary | ICD-10-CM

## 2022-07-14 DIAGNOSIS — O09899 Supervision of other high risk pregnancies, unspecified trimester: Secondary | ICD-10-CM

## 2022-07-14 DIAGNOSIS — Z3A33 33 weeks gestation of pregnancy: Secondary | ICD-10-CM

## 2022-07-14 NOTE — Patient Instructions (Addendum)
Colds/Coughs/Allergies:  Benadryl (alcohol free) 25 mg every 6 hours as needed  Breath right strips  Claritin  Cepacol throat lozenges  Chloraseptic throat spray  Cold-Eeze- up to three times per day  Cough drops, alcohol free  Flonase (by prescription only)  Guaifenesin  Mucinex  Robitussin DM (plain only, alcohol free)  Saline nasal spray/drops  Tylenol  Vicks Vaporub  Zinc lozenges  Zyrtec

## 2022-07-14 NOTE — Progress Notes (Signed)
   PRENATAL VISIT NOTE  Subjective:  Kelly Lane is a 27 y.o. [phone] at [redacted]w[redacted]d being seen today for ongoing prenatal care.  She is currently monitored for the following issues for this high-risk pregnancy and has Rh negative state in antepartum period; H/O emergency cesarean section; Supervision of other normal pregnancy, antepartum; Cystic fibrosis carrier, antepartum; and H/O severe pre-eclampsia on their problem list.  Patient reports  cough x 5 days . Seen in MAU on 3/8 and work up unremarkable. Denies fevers, chills, chest pain/SOB. Has concerns that there was protein in her urine dip in MAU.   Contractions: Irritability. Vag. Bleeding: None.  Movement: Present. Denies leaking of fluid.   The following portions of the patient's history were reviewed and updated as appropriate: allergies, current medications, past family history, past medical history, past social history, past surgical history and problem list.   Objective:   Vitals:   07/14/22 1606  BP: 124/80  Pulse: 78  Weight: 158 lb 3.2 oz (71.8 kg)   Fetal Status: Fetal Heart Rate (bpm): 145 Fundal Height: 33 cm Movement: Present     General:  Alert, oriented and cooperative. Patient is in no acute distress.  Skin: Skin is warm and dry. No rash noted.   Cardiovascular: Normal heart rate noted  Respiratory: Normal respiratory effort, no problems with respiration noted  Abdomen: Soft, gravid, appropriate for gestational age.  Pain/Pressure: Present      Assessment and Plan:  Pregnancy: G3P0111 at [redacted]w[redacted]d 1. Supervision of other normal pregnancy, antepartum 2. [redacted] weeks gestation of pregnancy Growth scheduled 3/21 GBS/GC/CT next visit  3. Acute cough Reassurance provided Reviewed safe OTC meds for symptoms Pt will return to care if new onset fevers, shortness of breath, chest pain or if she gets better then significantly worsens  4. H/O emergency cesarean section For TOLAC, consent previously signed  5. Rh negative  state in antepartum period S/p rhogam  6. H/O severe pre-eclampsia Not taking ldASA Normotensive Reviewed that +protein in urine dip can be for a variety of benign reasons, but that we will continue to monitor her blood pressures and symptoms for any new signs of preeclampsia  7. Cystic fibrosis carrier, antepartum Partner has completed testing in December - pt was told our office had result, but I can't see it today. Asked pt to follow up w/ her partner  Return in about 2 weeks (around 07/28/2022).  Future Appointments  Date Time Provider [place]  07/24/2022  9:15 AM [facility] NURSE [facility]  07/24/2022  9:30 AM [facility] US3 [facility]  07/28/2022  1:50 PM Inez Catalina, MD [facility] None  08/04/2022  1:30 PM Inez Catalina, MD [facility] None  08/11/2022  1:30 PM Constant, Vickii Chafe, MD [facility] None  08/18/2022  1:30 PM Leftwich-Kirby, Kathie Dike, CNM [facility] None  08/25/2022  1:30 PM Constant, Vickii Chafe, MD [facility] None  09/01/2022  1:30 PM Constant, Vickii Chafe, MD [place] None   Inez Catalina, MD

## 2022-07-14 NOTE — Progress Notes (Signed)
Pt presents for ROB visit. Pt has concerns about UA done at hospital 07/11/22. Pt also c/o persistent cough. Negative covid and flu at MAU.

## 2022-07-15 ENCOUNTER — Telehealth: Payer: Self-pay | Admitting: *Deleted

## 2022-07-15 NOTE — Telephone Encounter (Signed)
TC from pt reporting occ vomiting of "bile". Pt is able to keep down food and fluids. Vomiting is sporadic. Pt has cough/ cold symptoms with drainage. Recommended claritin from safe meds list. RX pepcid for heartburn. Recent BP's in office and MAU WNL. Pt advised to check BP daily and report elevated BPs. Pt also reporting intermittent vaginal pressure. Denies abdominal tightness or UC's with pressure. Advised pt that increase in pressure in vagina can be related to position of the baby. Advised pt to seek care in MAU if: unable to keep down food and fluids, BP's are elevated, FM is decreased, she has LOF, or she is having UC's unresolved by voiding, hydration and rest.

## 2022-07-16 ENCOUNTER — Other Ambulatory Visit: Payer: Self-pay | Admitting: *Deleted

## 2022-07-16 ENCOUNTER — Encounter: Payer: Self-pay | Admitting: Obstetrics and Gynecology

## 2022-07-16 DIAGNOSIS — K219 Gastro-esophageal reflux disease without esophagitis: Secondary | ICD-10-CM

## 2022-07-16 MED ORDER — FAMOTIDINE 20 MG PO TABS: 20.0000 mg | ORAL_TABLET | Freq: Two times a day (BID) | ORAL | 3 refills | Status: DC

## 2022-07-24 ENCOUNTER — Ambulatory Visit: Payer: BC Managed Care – PPO | Admitting: *Deleted

## 2022-07-24 ENCOUNTER — Ambulatory Visit: Payer: BC Managed Care – PPO | Attending: Obstetrics

## 2022-07-24 VITALS — BP 127/82 | HR 70

## 2022-07-24 DIAGNOSIS — O09213 Supervision of pregnancy with history of pre-term labor, third trimester: Secondary | ICD-10-CM | POA: Diagnosis not present

## 2022-07-24 DIAGNOSIS — Z3A34 34 weeks gestation of pregnancy: Secondary | ICD-10-CM

## 2022-07-24 DIAGNOSIS — O283 Abnormal ultrasonic finding on antenatal screening of mother: Secondary | ICD-10-CM

## 2022-07-24 DIAGNOSIS — O09899 Supervision of other high risk pregnancies, unspecified trimester: Secondary | ICD-10-CM | POA: Insufficient documentation

## 2022-07-24 DIAGNOSIS — O403XX0 Polyhydramnios, third trimester, not applicable or unspecified: Secondary | ICD-10-CM | POA: Diagnosis not present

## 2022-07-24 DIAGNOSIS — O09293 Supervision of pregnancy with other poor reproductive or obstetric history, third trimester: Secondary | ICD-10-CM | POA: Diagnosis not present

## 2022-07-24 DIAGNOSIS — Z141 Cystic fibrosis carrier: Secondary | ICD-10-CM

## 2022-07-24 DIAGNOSIS — O34219 Maternal care for unspecified type scar from previous cesarean delivery: Secondary | ICD-10-CM

## 2022-07-24 DIAGNOSIS — O09893 Supervision of other high risk pregnancies, third trimester: Secondary | ICD-10-CM | POA: Insufficient documentation

## 2022-07-28 ENCOUNTER — Ambulatory Visit (INDEPENDENT_AMBULATORY_CARE_PROVIDER_SITE_OTHER): Payer: BC Managed Care – PPO | Admitting: Obstetrics and Gynecology

## 2022-07-28 VITALS — BP 122/86 | HR 65 | Wt 161.0 lb

## 2022-07-28 DIAGNOSIS — O26899 Other specified pregnancy related conditions, unspecified trimester: Secondary | ICD-10-CM

## 2022-07-28 DIAGNOSIS — Z3480 Encounter for supervision of other normal pregnancy, unspecified trimester: Secondary | ICD-10-CM

## 2022-07-28 DIAGNOSIS — Z3A35 35 weeks gestation of pregnancy: Secondary | ICD-10-CM

## 2022-07-28 DIAGNOSIS — Z141 Cystic fibrosis carrier: Secondary | ICD-10-CM

## 2022-07-28 DIAGNOSIS — O09899 Supervision of other high risk pregnancies, unspecified trimester: Secondary | ICD-10-CM

## 2022-07-28 DIAGNOSIS — Z98891 History of uterine scar from previous surgery: Secondary | ICD-10-CM

## 2022-07-28 DIAGNOSIS — O26893 Other specified pregnancy related conditions, third trimester: Secondary | ICD-10-CM

## 2022-07-28 DIAGNOSIS — Z8759 Personal history of other complications of pregnancy, childbirth and the puerperium: Secondary | ICD-10-CM

## 2022-07-28 DIAGNOSIS — O09893 Supervision of other high risk pregnancies, third trimester: Secondary | ICD-10-CM

## 2022-07-28 DIAGNOSIS — Z6791 Unspecified blood type, Rh negative: Secondary | ICD-10-CM

## 2022-07-28 NOTE — Progress Notes (Signed)
   PRENATAL VISIT NOTE  Subjective:  Kelly Lane is a 27 y.o. [phone] at [redacted]w[redacted]d being seen today for ongoing prenatal care.  She is currently monitored for the following issues for this high-risk pregnancy and has Rh negative state in antepartum period; H/O emergency cesarean section; Supervision of other normal pregnancy, antepartum; Cystic fibrosis carrier, antepartum; and H/O severe pre-eclampsia on their problem list.  Patient reports  doing well overall .  Contractions: Irritability. Vag. Bleeding: None.  Movement: Present. Denies leaking of fluid.   The following portions of the patient's history were reviewed and updated as appropriate: allergies, current medications, past family history, past medical history, past social history, past surgical history and problem list.   Objective:   Vitals:   07/28/22 1354  BP: 122/86  Pulse: 65  Weight: 161 lb (73 kg)    Fetal Status: Fetal Heart Rate (bpm): 154   Movement: Present     General:  Alert, oriented and cooperative. Patient is in no acute distress.  Skin: Skin is warm and dry. No rash noted.   Cardiovascular: Normal heart rate noted  Respiratory: Normal respiratory effort, no problems with respiration noted  Abdomen: Soft, gravid, appropriate for gestational age.  Pain/Pressure: Present      Assessment and Plan:  Pregnancy: G3P0111 at [redacted]w[redacted]d 1. Supervision of other normal pregnancy, antepartum 2. [redacted] weeks gestation of pregnancy Discussed GBS next appt @34 /4: 2461g (45%), AC 72%, AFI 18.48, anterior, cephalic. Obtained for possible echogenic abdominal mass that has since resolved. No further growths indicated  3. H/O emergency cesarean section For TOLAC  4. H/O severe pre-eclampsia ldASA  normotensive today  5. Cystic fibrosis carrier, antepartum  6. Rh negative state in antepartum period S/p rhogam  Please refer to After Visit Summary for other counseling recommendations.   Return in about 1 week (around 08/04/2022)  for return OB at 36 weeks with GBS.  Future Appointments  Date Time Provider [place]  08/04/2022  1:30 PM Inez Catalina, MD [facility] None  08/11/2022  1:30 PM Constant, Vickii Chafe, MD [facility] None  08/18/2022  1:30 PM Leftwich-Kirby, Kathie Dike, CNM [facility] None  08/25/2022  1:30 PM Constant, Vickii Chafe, MD [facility] None  09/01/2022  1:30 PM Constant, Vickii Chafe, MD [place] None   Inez Catalina, MD

## 2022-07-29 ENCOUNTER — Telehealth: Payer: Self-pay

## 2022-07-29 ENCOUNTER — Encounter (HOSPITAL_COMMUNITY): Payer: Self-pay | Admitting: Obstetrics & Gynecology

## 2022-07-29 ENCOUNTER — Inpatient Hospital Stay (HOSPITAL_COMMUNITY)
Admission: AD | Admit: 2022-07-29 | Discharge: 2022-07-29 | Disposition: A | Discharge status: Home / Self Care | Payer: BC Managed Care – PPO | Attending: Obstetrics & Gynecology | Admitting: Obstetrics & Gynecology

## 2022-07-29 DIAGNOSIS — Z3A35 35 weeks gestation of pregnancy: Secondary | ICD-10-CM | POA: Diagnosis not present

## 2022-07-29 DIAGNOSIS — K219 Gastro-esophageal reflux disease without esophagitis: Secondary | ICD-10-CM | POA: Insufficient documentation

## 2022-07-29 DIAGNOSIS — R102 Pelvic and perineal pain: Secondary | ICD-10-CM | POA: Insufficient documentation

## 2022-07-29 DIAGNOSIS — R109 Unspecified abdominal pain: Secondary | ICD-10-CM | POA: Diagnosis not present

## 2022-07-29 DIAGNOSIS — O26893 Other specified pregnancy related conditions, third trimester: Secondary | ICD-10-CM | POA: Insufficient documentation

## 2022-07-29 DIAGNOSIS — O26899 Other specified pregnancy related conditions, unspecified trimester: Secondary | ICD-10-CM

## 2022-07-29 DIAGNOSIS — O368130 Decreased fetal movements, third trimester, not applicable or unspecified: Secondary | ICD-10-CM | POA: Insufficient documentation

## 2022-07-29 DIAGNOSIS — O99613 Diseases of the digestive system complicating pregnancy, third trimester: Secondary | ICD-10-CM | POA: Diagnosis not present

## 2022-07-29 LAB — URINALYSIS, ROUTINE W REFLEX MICROSCOPIC
Bilirubin Urine: NEGATIVE
Glucose, UA: NEGATIVE mg/dL
Hgb urine dipstick: NEGATIVE
Ketones, ur: NEGATIVE mg/dL
Leukocytes,Ua: NEGATIVE
Nitrite: NEGATIVE
Protein, ur: 30 mg/dL — AB
Specific Gravity, Urine: 1.028 (ref 1.005–1.030)
pH: 6 (ref 5.0–8.0)

## 2022-07-29 LAB — WET PREP, GENITAL
Clue Cells Wet Prep HPF POC: NONE SEEN
Sperm: NONE SEEN
Trich, Wet Prep: NONE SEEN
WBC, Wet Prep HPF POC: <10 (ref <10)
Yeast Wet Prep HPF POC: NONE SEEN

## 2022-07-29 NOTE — MAU Note (Signed)
.  Kelly Lane is a 27 y.o. at [redacted]w[redacted]d here in MAU reporting: pelvic and back cramping that feels like period cramps. The pain is constant and started this morning. She reports occasional braxton hicks ctx that are 5/10, but says that the cramping does not feel like ctx and is worse.  Vaginal Bleeding: Vaginal Bleeding Vag. Bleeding: None  Abnormal discharge/LOF: Membranes Sac Identifier: Sac 1 Membrane Status: Intact Amount: None and Amount: None  Fetal Movement: Reports decreased FM since this morning  LMP: Patient's last menstrual period was 11/18/2021 (approximate). Pain score:    There were no vitals filed for this visit.    FHT: Fetal Heart Rate Mode: External Baseline Rate (A): 144 bpm Multiple birth?: No  OB Office: Faculty Lab orders placed from triage: Urinalysis

## 2022-07-29 NOTE — Telephone Encounter (Signed)
Patient called stating that she is having increased pressure and having mild cramping and lower back aches that started this morning. States that she has not felt baby move since this morning. Patient advised to go to MAU for evaluation

## 2022-07-29 NOTE — MAU Provider Note (Signed)
History     CSN: [id number]  Arrival date and time: 07/29/22 1508   None     Chief Complaint  Patient presents with   Pelvic Pain   Back Pain   Decreased Fetal Movement   HPI Kelly Lane is a 27 y.o. [phone] at [redacted]w[redacted]d who presents to MAU for lower abdominal cramping and pelvic pain. She reports cramping started this morning. Pain is constant. She has not tried anything to relieve pain. She denies vaginal bleeding or leaking fluid. No itching, odor, or urinary s/s. She is also reporting decreased fetal movement today.   Patient receives [facility] at [facility], next appointment is scheduled on 4/1.  OB History     Gravida  3   Para  1   Term  0   Preterm  1   AB  1   Living  1      SAB  0   IAB  1   Ectopic  0   Multiple  0   Live Births  1           Past Medical History:  Diagnosis Date   Anxiety    Endometriosis 07/2015   GERD (gastroesophageal reflux disease)    NO MEDS   Ovarian cyst 07/2015    Past Surgical History:  Procedure Laterality Date   CESAREAN SECTION N/A 09/20/2020   Procedure: CESAREAN SECTION;  Surgeon: Osborne Oman, MD;  Location: [facility];  Service: Obstetrics;  Laterality: N/A;   LAPAROSCOPIC OVARIAN CYSTECTOMY Right 07/17/2015   Procedure: EXCISION OF ENDOMETRIOSIS;  Surgeon: Gae Dry, MD;  Location: [facility] ORS;  Service: Gynecology;  Laterality: Right;   LAPAROSCOPY N/A 07/17/2015   Procedure: LAPAROSCOPY OPERATIVE;  Surgeon: Gae Dry, MD;  Location: [facility] ORS;  Service: Gynecology;  Laterality: N/A;    Family History  Problem Relation Age of Onset   Hypertension Mother    Asthma Neg Hx    Cancer Neg Hx    Diabetes Neg Hx    Heart disease Neg Hx     Social History   Tobacco Use   Smoking status: Never   Smokeless tobacco: Never  Vaping Use   Vaping Use: Never used  Substance Use Topics   Alcohol use: Not Currently    Comment: every six months maybe 1 drink   Drug use: No    Allergies:  Allergies   Allergen Reactions   Compazine [Prochlorperazine] Anxiety    Medications Prior to Admission  Medication Sig Dispense Refill Last Dose   Prenatal MV & Min w/FA-DHA (PRENATAL GUMMIES PO) Take 1 tablet by mouth daily.   07/29/2022   aspirin 81 MG chewable tablet Chew 1 tablet (81 mg total) by mouth daily. (Patient not taking: Reported on 05/15/2022) 30 tablet 5    Blood Pressure Monitoring (BLOOD PRESSURE KIT) DEVI 1 kit by Does not apply route once a week. (Patient not taking: Reported on 07/14/2022) 1 each 0    famotidine (PEPCID) 20 MG tablet Take 1 tablet (20 mg total) by mouth 2 (two) times daily. 60 tablet 3    Review of Systems  Gastrointestinal:  Positive for abdominal pain (cramping).  Genitourinary:  Positive for pelvic pain.  All other systems reviewed and are negative.  Physical Exam  Patient Vitals for the past 24 hrs:  BP Temp Temp src Pulse SpO2 Height Weight  07/29/22 1934 136/89 -- -- 78 -- -- --  07/29/22 1624 116/86 -- -- 90 -- -- --  07/29/22 1529 117/85 -- -- (!) 101 -- -- --  07/29/22 1528 -- 98.1 F (36.7 C) Oral -- 100 % -- --  07/29/22 1520 -- -- -- -- -- 5\' 2"  (1.575 m) 75.8 kg   Physical Exam Vitals and nursing note reviewed. Exam conducted with a chaperone present.  Constitutional:      General: She is not in acute distress. Eyes:     Extraocular Movements: Extraocular movements intact.     Pupils: Pupils are equal, round, and reactive to light.  Cardiovascular:     Rate and Rhythm: Normal rate.  Pulmonary:     Effort: Pulmonary effort is normal. No respiratory distress.  Abdominal:     Palpations: Abdomen is soft.     Tenderness: There is no abdominal tenderness.     Comments: Gravid    Genitourinary:    Comments: Blind swabs collected Musculoskeletal:        General: Normal range of motion.     Cervical back: Normal range of motion.  Skin:    General: Skin is warm and dry.  Neurological:     General: No focal deficit present.     Mental  Status: She is alert and oriented to person, place, and time.  Psychiatric:        Mood and Affect: Mood normal.        Behavior: Behavior normal.   Dilation: Closed Exam by:: Maryagnes Amos, CNM  NST FHR: 145 bpm, moderate variability, +15x15 accels, no decels Toco: quiet  Results for orders placed or performed during the hospital encounter of 07/29/22 (from the past 24 hour(s))  Urinalysis, Routine w reflex microscopic -Urine, Clean Catch     Status: Abnormal   Collection Time: 07/29/22  4:09 PM  Result Value Ref Range   Color, Urine YELLOW YELLOW   APPearance HAZY (A) CLEAR   Specific Gravity, Urine 1.028 1.005 - 1.030   pH 6.0 5.0 - 8.0   Glucose, UA NEGATIVE NEGATIVE mg/dL   Hgb urine dipstick NEGATIVE NEGATIVE   Bilirubin Urine NEGATIVE NEGATIVE   Ketones, ur NEGATIVE NEGATIVE mg/dL   Protein, ur 30 (A) NEGATIVE mg/dL   Nitrite NEGATIVE NEGATIVE   Leukocytes,Ua NEGATIVE NEGATIVE   RBC / HPF 0-5 0 - 5 RBC/hpf   WBC, UA 0-5 0 - 5 WBC/hpf   Bacteria, UA FEW (A) NONE SEEN   Squamous Epithelial / HPF 0-5 0 - 5 /HPF   Mucus PRESENT   Wet prep, genital     Status: None   Collection Time: 07/29/22  4:13 PM   Specimen: Vaginal  Result Value Ref Range   Yeast Wet Prep HPF POC NONE SEEN NONE SEEN   Trich, Wet Prep NONE SEEN NONE SEEN   Clue Cells Wet Prep HPF POC NONE SEEN NONE SEEN   WBC, Wet Prep HPF POC <10 <10   Sperm NONE SEEN     MAU Course  Procedures  MDM UA Wet prep, GC/CT NST  UA and wet prep negative. Cervix closed/thick. NST reactive and reassuring. Patient offered recheck and declines.   Assessment and Plan   1. [redacted] weeks gestation of pregnancy   2. Abdominal pain affecting pregnancy    - Discharge home in stable condition - Return precautions given. Return to MAU as needed for new/worsening symptoms - Keep OB appointment as scheduled on 4/1  Renee Harder, CNM 07/29/2022, 9:41 PM

## 2022-07-29 NOTE — MAU Note (Signed)
RN called lab to inquire about Wet Prep. Lab tech, Maudie Mercury, answered and informed RN that that they have received sample and will process now.Marland Kitchen

## 2022-07-30 LAB — GC/CHLAMYDIA PROBE AMP (~~LOC~~) NOT AT ARMC
Chlamydia: NEGATIVE
Comment: NEGATIVE
Comment: NORMAL
Neisseria Gonorrhea: NEGATIVE

## 2022-08-04 ENCOUNTER — Ambulatory Visit (INDEPENDENT_AMBULATORY_CARE_PROVIDER_SITE_OTHER): Payer: BC Managed Care – PPO | Admitting: Obstetrics and Gynecology

## 2022-08-04 ENCOUNTER — Inpatient Hospital Stay (HOSPITAL_COMMUNITY)
Admission: AD | Admit: 2022-08-04 | Discharge: 2022-08-05 | Disposition: A | Discharge status: Home / Self Care | Payer: BC Managed Care – PPO | Attending: Obstetrics & Gynecology | Admitting: Obstetrics & Gynecology

## 2022-08-04 ENCOUNTER — Encounter (HOSPITAL_COMMUNITY): Payer: Self-pay | Admitting: Obstetrics & Gynecology

## 2022-08-04 VITALS — BP 136/89 | HR 89 | Wt 162.0 lb

## 2022-08-04 DIAGNOSIS — Z6791 Unspecified blood type, Rh negative: Secondary | ICD-10-CM

## 2022-08-04 DIAGNOSIS — O26893 Other specified pregnancy related conditions, third trimester: Secondary | ICD-10-CM | POA: Insufficient documentation

## 2022-08-04 DIAGNOSIS — O09893 Supervision of other high risk pregnancies, third trimester: Secondary | ICD-10-CM

## 2022-08-04 DIAGNOSIS — Z141 Cystic fibrosis carrier: Secondary | ICD-10-CM

## 2022-08-04 DIAGNOSIS — O09899 Supervision of other high risk pregnancies, unspecified trimester: Secondary | ICD-10-CM

## 2022-08-04 DIAGNOSIS — O26899 Other specified pregnancy related conditions, unspecified trimester: Secondary | ICD-10-CM

## 2022-08-04 DIAGNOSIS — R030 Elevated blood-pressure reading, without diagnosis of hypertension: Secondary | ICD-10-CM

## 2022-08-04 DIAGNOSIS — Z3480 Encounter for supervision of other normal pregnancy, unspecified trimester: Secondary | ICD-10-CM

## 2022-08-04 DIAGNOSIS — Z3A36 36 weeks gestation of pregnancy: Secondary | ICD-10-CM | POA: Diagnosis not present

## 2022-08-04 DIAGNOSIS — Z98891 History of uterine scar from previous surgery: Secondary | ICD-10-CM

## 2022-08-04 DIAGNOSIS — Z8759 Personal history of other complications of pregnancy, childbirth and the puerperium: Secondary | ICD-10-CM

## 2022-08-04 LAB — CBC
HCT: 32.9 % — ABNORMAL LOW (ref 36.0–46.0)
Hemoglobin: 11.6 g/dL — ABNORMAL LOW (ref 12.0–15.0)
MCH: 33.3 pg (ref 26.0–34.0)
MCHC: 35.3 g/dL (ref 30.0–36.0)
MCV: 94.5 fL (ref 80.0–100.0)
Platelets: 227 10*3/uL (ref 150–400)
RBC: 3.48 MIL/uL — ABNORMAL LOW (ref 3.87–5.11)
RDW: 13 % (ref 11.5–15.5)
WBC: 7.7 10*3/uL (ref 4.0–10.5)
nRBC: 0 % (ref 0.0–0.2)

## 2022-08-04 MED ORDER — LABETALOL HCL 5 MG/ML IV SOLN: 20.0000 mg | INTRAVENOUS | Status: DC | PRN

## 2022-08-04 MED ORDER — LABETALOL HCL 5 MG/ML IV SOLN: 40.0000 mg | INTRAVENOUS | Status: DC | PRN

## 2022-08-04 MED ORDER — HYDRALAZINE HCL 20 MG/ML IJ SOLN: 10.0000 mg | INTRAMUSCULAR | Status: DC | PRN

## 2022-08-04 MED ORDER — ACETAMINOPHEN 325 MG PO TABS: 650.0000 mg | ORAL_TABLET | Freq: Once | ORAL | Status: DC

## 2022-08-04 MED ORDER — LABETALOL HCL 5 MG/ML IV SOLN: 80.0000 mg | INTRAVENOUS | Status: DC | PRN

## 2022-08-04 NOTE — MAU Provider Note (Signed)
History     CSN: [id number]  Arrival date and time: 08/04/22 2220   None     No chief complaint on file.  Kelly Lane is a 27 y.o. [phone] at [redacted]w[redacted]d who presents for elevated blood pressure and headache. At home her blood pressure was 150 SBP.  She has a history of severe preE. She has not taken anything for headache. She denies vision changes, decreased fetal movement, vaginal bleeding and leakage of fluid.    OB History     Gravida  3   Para  1   Term  0   Preterm  1   AB  1   Living  1      SAB  0   IAB  1   Ectopic  0   Multiple  0   Live Births  1           Past Medical History:  Diagnosis Date   Anxiety    Endometriosis 07/2015   GERD (gastroesophageal reflux disease)    NO MEDS   Ovarian cyst 07/2015    Past Surgical History:  Procedure Laterality Date   CESAREAN SECTION N/A 09/20/2020   Procedure: CESAREAN SECTION;  Surgeon: Osborne Oman, MD;  Location: [facility];  Service: Obstetrics;  Laterality: N/A;   LAPAROSCOPIC OVARIAN CYSTECTOMY Right 07/17/2015   Procedure: EXCISION OF ENDOMETRIOSIS;  Surgeon: Gae Dry, MD;  Location: [facility] ORS;  Service: Gynecology;  Laterality: Right;   LAPAROSCOPY N/A 07/17/2015   Procedure: LAPAROSCOPY OPERATIVE;  Surgeon: Gae Dry, MD;  Location: [facility] ORS;  Service: Gynecology;  Laterality: N/A;    Family History  Problem Relation Age of Onset   Hypertension Mother    Asthma Neg Hx    Cancer Neg Hx    Diabetes Neg Hx    Heart disease Neg Hx     Social History   Tobacco Use   Smoking status: Never   Smokeless tobacco: Never  Vaping Use   Vaping Use: Never used  Substance Use Topics   Alcohol use: Not Currently    Comment: every six months maybe 1 drink   Drug use: No    Allergies:  Allergies  Allergen Reactions   Compazine [Prochlorperazine] Anxiety    Medications Prior to Admission  Medication Sig Dispense Refill Last Dose   Prenatal MV & Min w/FA-DHA (PRENATAL GUMMIES  PO) Take 1 tablet by mouth daily.   08/04/2022   aspirin 81 MG chewable tablet Chew 1 tablet (81 mg total) by mouth daily. (Patient not taking: Reported on 05/15/2022) 30 tablet 5    Blood Pressure Monitoring (BLOOD PRESSURE KIT) DEVI 1 kit by Does not apply route once a week. (Patient not taking: Reported on 07/14/2022) 1 each 0    famotidine (PEPCID) 20 MG tablet Take 1 tablet (20 mg total) by mouth 2 (two) times daily. 60 tablet 3     Review of Systems  Constitutional:  Negative for activity change, appetite change and fever.  Eyes:  Negative for photophobia and visual disturbance.  Respiratory:  Negative for shortness of breath.   Cardiovascular:  Negative for chest pain and leg swelling.  Gastrointestinal:  Negative for abdominal pain.  Genitourinary:  Negative for difficulty urinating.  Neurological:  Positive for headaches.   Physical Exam   Blood pressure (!) 132/103, temperature 98.7 F (37.1 C), temperature source Oral, resp. rate 18, height 5\' 2"  (1.575 m), weight 73.8 kg, last menstrual period 11/18/2021, currently breastfeeding.  Physical Exam Vitals reviewed.  Constitutional:      General: She is not in acute distress.    Appearance: She is not ill-appearing, toxic-appearing or diaphoretic.  HENT:     Head: Normocephalic.     Right Ear: External ear normal.     Left Ear: External ear normal.     Nose: Nose normal.  Eyes:     Conjunctiva/sclera: Conjunctivae normal.  Cardiovascular:     Rate and Rhythm: Normal rate and regular rhythm.  Abdominal:     Palpations: Abdomen is soft.     Tenderness: There is no abdominal tenderness. There is no guarding or rebound.  Musculoskeletal:     Right lower leg: No edema.     Left lower leg: No edema.  Neurological:     Mental Status: She is alert and oriented to person, place, and time.  Psychiatric:        Mood and Affect: Mood normal.        Behavior: Behavior normal.     MAU Course  Procedures  MDM ***  Assessment  and Plan  [name] is a 27 y.o. [id number] at [redacted]w[redacted]d who presents   Meds: Tests: Humanistic: Return/Follow up:    Raylee Adamec Autry-Lott 08/04/2022, 10:55 PM

## 2022-08-04 NOTE — Progress Notes (Signed)
   PRENATAL VISIT NOTE  Subjective:  Kelly Lane is a 27 y.o. [phone] at [redacted]w[redacted]d being seen today for ongoing prenatal care.  She is currently monitored for the following issues for this high-risk pregnancy and has Rh negative state in antepartum period; H/O emergency cesarean section; Supervision of other normal pregnancy, antepartum; Cystic fibrosis carrier, antepartum; and H/O severe pre-eclampsia on their problem list.  Patient reports  doing ok overall . Is interested in scheduling CS vs IOL.  Contractions: Not present. Vag. Bleeding: None.  Movement: Present. Denies leaking of fluid.   The following portions of the patient's history were reviewed and updated as appropriate: allergies, current medications, past family history, past medical history, past social history, past surgical history and problem list.   Objective:   Vitals:   08/04/22 1329  BP: 136/89  Pulse: 89  Weight: 162 lb (73.5 kg)    Fetal Status: Fetal Heart Rate (bpm): 140 Fundal Height: 38 cm Movement: Present    General:  Alert, oriented and cooperative. Patient is in no acute distress.  Skin: Skin is warm and dry. No rash noted.   Cardiovascular: Normal heart rate noted  Respiratory: Normal respiratory effort, no problems with respiration noted  Abdomen: Soft, gravid, appropriate for gestational age.  Pain/Pressure: Present     SCE cl/l/hi - exam performed in presence of chaperone  Assessment and Plan:  Pregnancy: [id number] at [redacted]w[redacted]d 1. Supervision of other normal pregnancy, antepartum 2. [redacted] weeks gestation of pregnancy Seen in MAU for ctx, GC/CT negative at that time. Cervix closed.  Is debating timing of delivery and mode of delivery. Discussed that rCS are typically scheduled at 39 weeks, but that we could schedule for 41 weeks to give her more time to spontaneously labor. We could also order IOL for 41 weeks. Discussed higher probability of success with spontaneous labor, but that women can still TOLAC/VBAC  with IOL. She is considering her options and will let us know her preferences at the next appt. - Culture, beta strep (group b only)  3. H/O severe pre-eclampsia Normotensive today, but BP 136/89 Discussed continued home BP monitoring. Reviewed pt should call for values >140/90 and potential indication for delivery at 37 weeks if diagnosed with gHTN/preE  4. Cystic fibrosis carrier, antepartum Partner testing negative (I reviewed pt's result on his phone today)  5. H/O emergency cesarean section For TOLAC, consent previously signed  6. Rh negative state in antepartum period S/p rhogam  Return in about 1 week (around 08/11/2022).  Future Appointments  Date Time Provider [place]  08/11/2022  1:30 PM Constant, Vickii Chafe, MD [facility] None  08/18/2022  1:30 PM Leftwich-Kirby, Kathie Dike, CNM [facility] None  08/25/2022  1:30 PM Constant, Vickii Chafe, MD [facility] None  09/01/2022  1:30 PM Constant, Vickii Chafe, MD [place] None    Inez Catalina, MD

## 2022-08-04 NOTE — MAU Note (Addendum)
Pt says she has a H/A - started at 830pm- no meds- tension but not painful  BP-150/100-  At Dr office - 138/88- told to monitor  No vision changes , no epigastric   PNC- with [facility]

## 2022-08-05 ENCOUNTER — Ambulatory Visit (INDEPENDENT_AMBULATORY_CARE_PROVIDER_SITE_OTHER): Payer: BC Managed Care – PPO | Admitting: Obstetrics & Gynecology

## 2022-08-05 VITALS — BP 132/94 | HR 78 | Wt 162.0 lb

## 2022-08-05 DIAGNOSIS — Z3A36 36 weeks gestation of pregnancy: Secondary | ICD-10-CM | POA: Diagnosis not present

## 2022-08-05 DIAGNOSIS — Z8759 Personal history of other complications of pregnancy, childbirth and the puerperium: Secondary | ICD-10-CM

## 2022-08-05 DIAGNOSIS — R030 Elevated blood-pressure reading, without diagnosis of hypertension: Secondary | ICD-10-CM | POA: Diagnosis not present

## 2022-08-05 DIAGNOSIS — Z98891 History of uterine scar from previous surgery: Secondary | ICD-10-CM

## 2022-08-05 DIAGNOSIS — Z3480 Encounter for supervision of other normal pregnancy, unspecified trimester: Secondary | ICD-10-CM

## 2022-08-05 DIAGNOSIS — O133 Gestational [pregnancy-induced] hypertension without significant proteinuria, third trimester: Secondary | ICD-10-CM

## 2022-08-05 LAB — COMPREHENSIVE METABOLIC PANEL
ALT: 13 U/L (ref 0–44)
AST: 23 U/L (ref 15–41)
Albumin: 2.7 g/dL — ABNORMAL LOW (ref 3.5–5.0)
Alkaline Phosphatase: 127 U/L — ABNORMAL HIGH (ref 38–126)
Anion gap: 9 (ref 5–15)
BUN: 6 mg/dL (ref 6–20)
CO2: 20 mmol/L — ABNORMAL LOW (ref 22–32)
Calcium: 8.9 mg/dL (ref 8.9–10.3)
Chloride: 107 mmol/L (ref 98–111)
Creatinine, Ser: 0.54 mg/dL (ref 0.44–1.00)
GFR, Estimated: >60 mL/min (ref >60)
Glucose, Bld: 75 mg/dL (ref 70–99)
Potassium: 3.6 mmol/L (ref 3.5–5.1)
Sodium: 136 mmol/L (ref 135–145)
Total Bilirubin: 1.1 mg/dL (ref 0.3–1.2)
Total Protein: 6.2 g/dL — ABNORMAL LOW (ref 6.5–8.1)

## 2022-08-05 LAB — PROTEIN / CREATININE RATIO, URINE
Creatinine, Urine: 33 mg/dL
Total Protein, Urine: <6 mg/dL

## 2022-08-05 NOTE — Progress Notes (Signed)
Pt presents for ROB. BP elevated in office today. Reports head "tension" and visual disturbance with changing positions.

## 2022-08-06 ENCOUNTER — Telehealth (HOSPITAL_COMMUNITY): Payer: Self-pay | Admitting: *Deleted

## 2022-08-06 ENCOUNTER — Other Ambulatory Visit: Payer: Self-pay | Admitting: Advanced Practice Midwife

## 2022-08-06 ENCOUNTER — Encounter (HOSPITAL_COMMUNITY): Payer: Self-pay

## 2022-08-06 DIAGNOSIS — O10919 Unspecified pre-existing hypertension complicating pregnancy, unspecified trimester: Secondary | ICD-10-CM

## 2022-08-06 NOTE — Progress Notes (Signed)
   PRENATAL VISIT NOTE  Subjective:  Kelly Lane is a 27 y.o. [phone] at [redacted]w[redacted]d being seen today for ongoing prenatal care.  She is currently monitored for the following issues for this pregnancy and has Rh negative state in antepartum period; H/O emergency cesarean section; Supervision of other normal pregnancy, antepartum; Cystic fibrosis carrier, antepartum; and H/O severe pre-eclampsia on their problem list. She was seen in MAU with BP 130's /90's for f/u today. Labs were normal Patient reports no complaints.  Contractions: Irritability. Vag. Bleeding: None.  Movement: Present. Denies leaking of fluid.   The following portions of the patient's history were reviewed and updated as appropriate: allergies, current medications, past family history, past medical history, past social history, past surgical history and problem list.   Objective:   Vitals:   08/05/22 1551 08/05/22 1559  BP: (!) 128/90 (!) 132/94  Pulse: 73 78  Weight: 161 lb (73 kg) 162 lb (73.5 kg)    Fetal Status: Fetal Heart Rate (bpm): 150   Movement: Present     General:  Alert, oriented and cooperative. Patient is in no acute distress.  Skin: Skin is warm and dry. No rash noted.   Cardiovascular: Normal heart rate noted  Respiratory: Normal respiratory effort, no problems with respiration noted  Abdomen: Soft, gravid, appropriate for gestational age.  Pain/Pressure: Absent     Pelvic: Cervical exam deferred        Extremities: Normal range of motion.  Edema: Trace  Mental Status: Normal mood and affect. Normal behavior. Normal judgment and thought content.   Assessment and Plan:  Pregnancy: [id number] at [redacted]w[redacted]d 1. H/O emergency cesarean section Plans TOLAC  2. Supervision of other normal pregnancy, antepartum   3. H/O severe pre-eclampsia Elevated B, may have worsening CHTN  4. Gestational hypertension, third trimester Indication for delivery at 37 weeks, IOL is scheduled  Preterm labor symptoms and general  obstetric precautions including but not limited to vaginal bleeding, contractions, leaking of fluid and fetal movement were reviewed in detail with the patient. Please refer to After Visit Summary for other counseling recommendations.   Return if symptoms worsen or fail to improve.  Future Appointments  Date Time Provider [place]  08/10/2022  6:45 AM [facility] SCHED ROOM [facility] None  08/11/2022  1:30 PM Constant, Vickii Chafe, MD [facility] None  08/18/2022  1:30 PM Leftwich-Kirby, Kathie Dike, CNM [facility] None  08/25/2022  1:30 PM Constant, Vickii Chafe, MD [place] None  09/01/2022  1:30 PM Constant, Vickii Chafe, MD [facility] None    Emeterio Reeve, MD

## 2022-08-06 NOTE — Telephone Encounter (Signed)
Preadmission screen  

## 2022-08-07 ENCOUNTER — Encounter: Payer: Self-pay | Admitting: Obstetrics and Gynecology

## 2022-08-07 LAB — CULTURE, BETA STREP (GROUP B ONLY): Strep Gp B Culture: POSITIVE — AB

## 2022-08-08 ENCOUNTER — Other Ambulatory Visit: Payer: Self-pay | Admitting: Advanced Practice Midwife

## 2022-08-10 ENCOUNTER — Other Ambulatory Visit (HOSPITAL_COMMUNITY): Payer: Self-pay

## 2022-08-10 ENCOUNTER — Inpatient Hospital Stay (HOSPITAL_COMMUNITY): Payer: BC Managed Care – PPO

## 2022-08-10 ENCOUNTER — Inpatient Hospital Stay (HOSPITAL_COMMUNITY)
Admission: AD | Admit: 2022-08-10 | Discharge: 2022-08-14 | DRG: 806 | Disposition: A | Discharge status: Home / Self Care | Payer: BC Managed Care – PPO | Attending: Family Medicine | Admitting: Family Medicine

## 2022-08-10 ENCOUNTER — Encounter (HOSPITAL_COMMUNITY): Payer: Self-pay | Admitting: Obstetrics and Gynecology

## 2022-08-10 DIAGNOSIS — O99824 Streptococcus B carrier state complicating childbirth: Secondary | ICD-10-CM | POA: Diagnosis present

## 2022-08-10 DIAGNOSIS — Z6791 Unspecified blood type, Rh negative: Secondary | ICD-10-CM | POA: Diagnosis not present

## 2022-08-10 DIAGNOSIS — O26893 Other specified pregnancy related conditions, third trimester: Secondary | ICD-10-CM | POA: Diagnosis not present

## 2022-08-10 DIAGNOSIS — O9902 Anemia complicating childbirth: Secondary | ICD-10-CM | POA: Diagnosis not present

## 2022-08-10 DIAGNOSIS — O1092 Unspecified pre-existing hypertension complicating childbirth: Secondary | ICD-10-CM | POA: Diagnosis not present

## 2022-08-10 DIAGNOSIS — O99820 Streptococcus B carrier state complicating pregnancy: Secondary | ICD-10-CM | POA: Diagnosis not present

## 2022-08-10 DIAGNOSIS — O26899 Other specified pregnancy related conditions, unspecified trimester: Secondary | ICD-10-CM

## 2022-08-10 DIAGNOSIS — O34219 Maternal care for unspecified type scar from previous cesarean delivery: Secondary | ICD-10-CM

## 2022-08-10 DIAGNOSIS — Z3A37 37 weeks gestation of pregnancy: Secondary | ICD-10-CM

## 2022-08-10 DIAGNOSIS — Z7982 Long term (current) use of aspirin: Secondary | ICD-10-CM | POA: Diagnosis not present

## 2022-08-10 DIAGNOSIS — O134 Gestational [pregnancy-induced] hypertension without significant proteinuria, complicating childbirth: Secondary | ICD-10-CM | POA: Diagnosis not present

## 2022-08-10 DIAGNOSIS — O10919 Unspecified pre-existing hypertension complicating pregnancy, unspecified trimester: Secondary | ICD-10-CM | POA: Diagnosis present

## 2022-08-10 DIAGNOSIS — Z3480 Encounter for supervision of other normal pregnancy, unspecified trimester: Secondary | ICD-10-CM

## 2022-08-10 DIAGNOSIS — Z141 Cystic fibrosis carrier: Secondary | ICD-10-CM

## 2022-08-10 DIAGNOSIS — D649 Anemia, unspecified: Secondary | ICD-10-CM | POA: Diagnosis not present

## 2022-08-10 DIAGNOSIS — Z98891 History of uterine scar from previous surgery: Secondary | ICD-10-CM

## 2022-08-10 DIAGNOSIS — O34211 Maternal care for low transverse scar from previous cesarean delivery: Secondary | ICD-10-CM | POA: Diagnosis not present

## 2022-08-10 DIAGNOSIS — Z2882 Immunization not carried out because of caregiver refusal: Secondary | ICD-10-CM | POA: Diagnosis not present

## 2022-08-10 DIAGNOSIS — D62 Acute posthemorrhagic anemia: Secondary | ICD-10-CM | POA: Diagnosis not present

## 2022-08-10 LAB — COMPREHENSIVE METABOLIC PANEL
ALT: 15 U/L (ref 0–44)
AST: 23 U/L (ref 15–41)
Albumin: 2.9 g/dL — ABNORMAL LOW (ref 3.5–5.0)
Alkaline Phosphatase: 134 U/L — ABNORMAL HIGH (ref 38–126)
Anion gap: 12 (ref 5–15)
BUN: 7 mg/dL (ref 6–20)
CO2: 22 mmol/L (ref 22–32)
Calcium: 9.3 mg/dL (ref 8.9–10.3)
Chloride: 102 mmol/L (ref 98–111)
Creatinine, Ser: 0.72 mg/dL (ref 0.44–1.00)
GFR, Estimated: >60 mL/min (ref >60)
Glucose, Bld: 113 mg/dL — ABNORMAL HIGH (ref 70–99)
Potassium: 3.3 mmol/L — ABNORMAL LOW (ref 3.5–5.1)
Sodium: 136 mmol/L (ref 135–145)
Total Bilirubin: 1.3 mg/dL — ABNORMAL HIGH (ref 0.3–1.2)
Total Protein: 6.7 g/dL (ref 6.5–8.1)

## 2022-08-10 LAB — CBC
HCT: 35.5 % — ABNORMAL LOW (ref 36.0–46.0)
Hemoglobin: 11.8 g/dL — ABNORMAL LOW (ref 12.0–15.0)
MCH: 32.1 pg (ref 26.0–34.0)
MCHC: 33.2 g/dL (ref 30.0–36.0)
MCV: 96.5 fL (ref 80.0–100.0)
Platelets: 198 10*3/uL (ref 150–400)
RBC: 3.68 MIL/uL — ABNORMAL LOW (ref 3.87–5.11)
RDW: 13.2 % (ref 11.5–15.5)
WBC: 8.8 10*3/uL (ref 4.0–10.5)
nRBC: 0 % (ref 0.0–0.2)

## 2022-08-10 LAB — PROTEIN / CREATININE RATIO, URINE
Creatinine, Urine: 98 mg/dL
Protein Creatinine Ratio: 0.12 mg/mg{Cre} (ref 0.00–0.15)
Total Protein, Urine: 12 mg/dL

## 2022-08-10 LAB — TYPE AND SCREEN
ABO/RH(D): AB NEG
Antibody Screen: POSITIVE

## 2022-08-10 LAB — RPR: RPR Ser Ql: NONREACTIVE

## 2022-08-10 MED ORDER — HYDROXYZINE HCL 50 MG PO TABS: 25.0000 mg | ORAL_TABLET | Freq: Four times a day (QID) | ORAL | Status: DC | PRN

## 2022-08-10 MED ORDER — LIDOCAINE HCL (PF) 1 % IJ SOLN
30.0000 mL | INTRAMUSCULAR | Status: AC | PRN
  Administered 2022-08-12: 30 mL via SUBCUTANEOUS
  Filled 2022-08-10: qty 30

## 2022-08-10 MED ORDER — OXYTOCIN-SODIUM CHLORIDE 30-0.9 UT/500ML-% IV SOLN
2.5000 [IU]/h | INTRAVENOUS | Status: DC
  Administered 2022-08-12: 2.5 [IU]/h via INTRAVENOUS
  Filled 2022-08-10: qty 500

## 2022-08-10 MED ORDER — LACTATED RINGERS IV SOLN: INTRAVENOUS | Status: DC

## 2022-08-10 MED ORDER — SOD CITRATE-CITRIC ACID 500-334 MG/5ML PO SOLN
30.0000 mL | ORAL | Status: DC | PRN
  Administered 2022-08-10: 30 mL via ORAL
  Filled 2022-08-10: qty 30

## 2022-08-10 MED ORDER — FENTANYL CITRATE (PF) 100 MCG/2ML IJ SOLN
100.0000 ug | INTRAMUSCULAR | Status: DC | PRN
  Administered 2022-08-11 (×2): 100 ug via INTRAVENOUS
  Filled 2022-08-10 (×2): qty 2

## 2022-08-10 MED ORDER — OXYCODONE-ACETAMINOPHEN 5-325 MG PO TABS: 2.0000 | ORAL_TABLET | ORAL | Status: DC | PRN

## 2022-08-10 MED ORDER — OXYCODONE-ACETAMINOPHEN 5-325 MG PO TABS: 1.0000 | ORAL_TABLET | ORAL | Status: DC | PRN

## 2022-08-10 MED ORDER — LACTATED RINGERS IV SOLN
500.0000 mL | INTRAVENOUS | Status: DC | PRN
  Administered 2022-08-11 – 2022-08-12 (×3): 500 mL via INTRAVENOUS

## 2022-08-10 MED ORDER — SODIUM CHLORIDE 0.9 % IV SOLN
5.0000 10*6.[IU] | Freq: Once | INTRAVENOUS | Status: AC
  Administered 2022-08-11: 5 10*6.[IU] via INTRAVENOUS
  Filled 2022-08-10: qty 5

## 2022-08-10 MED ORDER — OXYTOCIN BOLUS FROM INFUSION
333.0000 mL | Freq: Once | INTRAVENOUS | Status: AC
  Administered 2022-08-12: 333 mL via INTRAVENOUS

## 2022-08-10 MED ORDER — PENICILLIN G POT IN DEXTROSE 60000 UNIT/ML IV SOLN
3.0000 10*6.[IU] | INTRAVENOUS | Status: DC
  Administered 2022-08-11 – 2022-08-12 (×8): 3 10*6.[IU] via INTRAVENOUS
  Filled 2022-08-10 (×8): qty 50

## 2022-08-10 MED ORDER — TERBUTALINE SULFATE 1 MG/ML IJ SOLN
0.2500 mg | Freq: Once | INTRAMUSCULAR | Status: DC | PRN
  Filled 2022-08-10: qty 1

## 2022-08-10 MED ORDER — ACETAMINOPHEN 325 MG PO TABS
650.0000 mg | ORAL_TABLET | ORAL | Status: DC | PRN
  Administered 2022-08-12: 650 mg via ORAL
  Filled 2022-08-10: qty 2

## 2022-08-10 MED ORDER — OXYTOCIN-SODIUM CHLORIDE 30-0.9 UT/500ML-% IV SOLN
1.0000 m[IU]/min | INTRAVENOUS | Status: DC
  Administered 2022-08-10: 1 m[IU]/min via INTRAVENOUS

## 2022-08-10 MED ORDER — ONDANSETRON HCL 4 MG/2ML IJ SOLN
4.0000 mg | Freq: Four times a day (QID) | INTRAMUSCULAR | Status: DC | PRN
  Administered 2022-08-11: 4 mg via INTRAVENOUS
  Filled 2022-08-10: qty 2

## 2022-08-10 NOTE — Progress Notes (Signed)
Attempted to place Foley bulb again, internal os still closed although cervix is much softer and 50% effaced. Reviewed plan with patient and encouraged her to allow [place] to increase pitocin in order to achieve more regular and painful contractions. She is agreeable.   Baldemar Lenis, MD, [facility] Attending Center for [employer] [facility])

## 2022-08-10 NOTE — Progress Notes (Signed)
Pt very hesitant to allow me to increase the pitocin. Pt states "but I am having contractions".  I explained the importance of titrating pitocin until consistent, painful contractions in order to have cervical change.  Pt states "but if you go up, it will be more uncomfortable".  I explained that we are going up so very slow and unfortunately, discomfort is necessary in order to get to the cervical change that we need. Pt understands and is agreeable to continuing to increase pitocin very slowly.

## 2022-08-10 NOTE — Progress Notes (Signed)
Attempted to place Foley bulb, 0.5 cm external, closed internal os. Could not place. Will start low dose pitocin and plan for Foley placement when slightly more dilated. Patient verbalized understanding and consent to plan.  Baldemar Lenis, MD, [facility] Attending Center for [employer] [facility])

## 2022-08-10 NOTE — Anesthesia Preprocedure Evaluation (Signed)
Anesthesia Evaluation  Patient identified by MRN, date of birth, ID band Patient awake    Reviewed: Allergy & Precautions, NPO status , Patient's Chart, lab work & pertinent test results  Airway Mallampati: II  TM Distance: >3 FB Neck ROM: Full    Dental no notable dental hx.    Pulmonary neg pulmonary ROS   Pulmonary exam normal breath sounds clear to auscultation       Cardiovascular hypertension (gHTN, no home meds), Normal cardiovascular exam Rhythm:Regular Rate:Normal     Neuro/Psych  PSYCHIATRIC DISORDERS Anxiety     negative neurological ROS     GI/Hepatic Neg liver ROS,GERD  Controlled,,  Endo/Other  negative endocrine ROS    Renal/GU negative Renal ROS  negative genitourinary   Musculoskeletal   Abdominal   Peds  Hematology  (+) Blood dyscrasia, anemia Hb 9.9, plt 162   Anesthesia Other Findings All: Compazine  Reproductive/Obstetrics (+) Pregnancy                             Anesthesia Physical Anesthesia Plan  ASA: 3  Anesthesia Plan: Epidural   Post-op Pain Management:    Induction:   PONV Risk Score and Plan:   Airway Management Planned: Natural Airway  Additional Equipment: None  Intra-op Plan:   Post-operative Plan:   Informed Consent: I have reviewed the patients History and Physical, chart, labs and discussed the procedure including the risks, benefits and alternatives for the proposed anesthesia with the patient or authorized representative who has indicated his/her understanding and acceptance.       Plan Discussed with:   Anesthesia Plan Comments: (37 Wk G3P1 w PIH)       Anesthesia Quick Evaluation

## 2022-08-10 NOTE — H&P (Signed)
Obstetric History and Physical  Kelly Lane is a 27 y.o. [phone] with IUP at [redacted]w[redacted]d presenting for induction of labor for gestational hypertension. Denies regular contractions.   Prenatal Course Source of Care: [facility] with onset of care at 20 weeks Pregnancy complications or risks: Patient Active Problem List   Diagnosis Date Noted   Chronic hypertension affecting pregnancy 08/10/2022   H/O severe pre-eclampsia 04/17/2022   Cystic fibrosis carrier, antepartum 03/24/2022   Supervision of other normal pregnancy, antepartum 03/12/2022   H/O emergency cesarean section 09/20/2020   Rh negative state in antepartum period 06/07/2020   She plans to breastfeed She desires condoms for postpartum contraception.   Prenatal labs and studies: ABO, Rh: AB/Negative/-- 15-Apr-2023 1616) Antibody: Negative 2023-04-15 1616) Rubella: 9.51 04/15/2023 1616) RPR: Non Reactive (02/12 0930)  HBsAg: Negative 04-15-2023 1616)  HIV: Non Reactive (02/12 0930)  WYO:VZCHYIFO/-- (04/01 1351) 2 hr GTT:  normal Genetic screening normal Anatomy US normal  Medical History:  Past Medical History:  Diagnosis Date   Anxiety    Endometriosis 07/2015   GERD (gastroesophageal reflux disease)    NO MEDS   Ovarian cyst 07/2015    Past Surgical History:  Procedure Laterality Date   CESAREAN SECTION N/A 09/20/2020   Procedure: CESAREAN SECTION;  Surgeon: Tereso Newcomer, MD;  Location: [facility];  Service: Obstetrics;  Laterality: N/A;   LAPAROSCOPIC OVARIAN CYSTECTOMY Right 07/17/2015   Procedure: EXCISION OF ENDOMETRIOSIS;  Surgeon: Nadara Mustard, MD;  Location: [facility] ORS;  Service: Gynecology;  Laterality: Right;   LAPAROSCOPY N/A 07/17/2015   Procedure: LAPAROSCOPY OPERATIVE;  Surgeon: Nadara Mustard, MD;  Location: [facility] ORS;  Service: Gynecology;  Laterality: N/A;    OB History  Gravida Para Term Preterm AB Living  3 1 0 1 1 1   SAB IAB Ectopic Multiple Live Births  0 1 0 0 1    # Outcome Date GA Lbr  Len/2nd Weight Sex Delivery Anes PTL Lv  3 Current           2 Preterm 09/20/20 [redacted]w[redacted]d  1620 g M CS-LTranv Spinal  LIV  1 IAB 01/19/18 [redacted]w[redacted]d           Social History   Socioeconomic History   Marital status: Single    Spouse name: Not on file   Number of children: Not on file   Years of education: Not on file   Highest education level: Not on file  Occupational History   Not on file  Tobacco Use   Smoking status: Never   Smokeless tobacco: Never  Vaping Use   Vaping Use: Never used  Substance and Sexual Activity   Alcohol use: Not Currently    Comment: every six months maybe 1 drink   Drug use: No   Sexual activity: Not Currently    Partners: Male    Birth control/protection: None    Comment: Pregnant   Other Topics Concern   Not on file  Social History Narrative   Not on file   Social Determinants of Health   Financial Resource Strain: Not on file  Food Insecurity: Not on file  Transportation Needs: Not on file  Physical Activity: Not on file  Stress: Not on file  Social Connections: Not on file    Family History  Problem Relation Age of Onset   Hypertension Mother    Asthma Neg Hx    Cancer Neg Hx    Diabetes Neg Hx    Heart disease  Neg Hx     Medications Prior to Admission  Medication Sig Dispense Refill Last Dose   famotidine (PEPCID) 20 MG tablet Take 1 tablet (20 mg total) by mouth 2 (two) times daily. 60 tablet 3 Past Week   Prenatal MV & Min w/FA-DHA (PRENATAL GUMMIES PO) Take 1 tablet by mouth daily.   08/10/2022 at 0600   aspirin 81 MG chewable tablet Chew 1 tablet (81 mg total) by mouth daily. (Patient not taking: Reported on 05/15/2022) 30 tablet 5    Blood Pressure Monitoring (BLOOD PRESSURE KIT) DEVI 1 kit by Does not apply route once a week. (Patient not taking: Reported on 07/14/2022) 1 each 0     Allergies  Allergen Reactions   Compazine [Prochlorperazine] Anxiety    Review of Systems: Negative except for what is mentioned in  HPI.  Physical Exam: BP 127/87   Pulse 71   Resp 18   LMP 11/18/2021 (Approximate)  CONSTITUTIONAL: Well-developed, well-nourished female in no acute distress.  HENT:  Normocephalic, atraumatic, External right and left ear normal. Oropharynx is clear and moist EYES: Conjunctivae and EOM are normal. Pupils are equal, round, and reactive to light. No scleral icterus.  NECK: Normal range of motion, supple, no masses SKIN: Skin is warm and dry. No rash noted. Not diaphoretic. No erythema. No pallor. NEUROLOGIC: Alert and oriented to person, place, and time. Normal reflexes, muscle tone coordination. No cranial nerve deficit noted. PSYCHIATRIC: Normal mood and affect. Normal behavior. Normal judgment and thought content. CARDIOVASCULAR: Normal heart rate noted, regular rhythm RESPIRATORY: Effort and breath sounds normal, no problems with respiration noted ABDOMEN: Soft, nontender, nondistended, gravid. MUSCULOSKELETAL: Normal range of motion. No edema and no tenderness. 2+ distal pulses.  FHT:  Baseline rate 140 bpm   Variability moderate  Accelerations present   Decelerations none Contractions: none   Pertinent Labs/Studies:   Results for orders placed or performed during the hospital encounter of 08/10/22 (from the past 24 hour(s))  Protein / creatinine ratio, urine     Status: None   Collection Time: 08/10/22  7:33 AM  Result Value Ref Range   Creatinine, Urine 98 mg/dL   Total Protein, Urine 12 mg/dL   Protein Creatinine Ratio 0.12 0.00 - 0.15 mg/mg[Cre]    Assessment : Henrene HawkingDesiree I Heatwole is a 27 y.o. [id number] at 7085w0d being admitted for induction of labor due to gestational hypertension. Had extended discussion with patient and her spouse. She had a traumatic delivery last pregnancy that included lengthy stay in hospital for severe pre-eclampsia with induction with cytotec that resulted in an emergent primary c-section for non-reassuring fetal heart tracing. She is understandably worried  about induction with this pregnancy. [facility]  Reviewed recommendation for delivery at 37 weeks given diagnosis of gestational hypertension and that once baby is term, recommend delivery for the spectrum of cHTN/gHTN/PEC.   Reviewed risks with TOLAC versus RCS in detail. Patient counseled regarding potential vaginal delivery, chance of success, future implications, possible uterine rupture and need for urgent/emergent repeat cesarean. Counseled regarding potential need for repeat c-section for reasons unrelated to first c-section. Counseled regarding scheduled repeat cesarean including risks of bleeding, infection, damage to surrounding tissue, abnormal placentation, implications for future pregnancies. All questions answered.  Reviewed that given her diagnosis of gHTN, h/o severe PEC and prior CS, would not recommend outpatient foley bulb for induction.   Reviewed that since she and baby are both well, there is no urgency to deliver immediately and she  would like to aim for one intervention at a time, which is reasonable. We also discussed pain management and she would likely opt for epidural at some point.  Plan:  Labor  - Admit to labor and delivery - Induction/Augmentation as ordered as per protocol - Analgesia as needed  FWB  - Cat I fetal heart tracing   - GBS positive, PCN management  HTN/PEC - IV labetalol per protocol for BP management - MgSO4 for PEC with severe features  Prior CS - desires TOLAC  Rh neg - will check baby for management  Delivery plan - Hopeful for vaginal delivery   K. Therese Sarah, MD, [facility] Attending Center for [facility] Healthcare (Faculty Practice)  08/10/2022, 9:46 AM

## 2022-08-10 NOTE — Progress Notes (Signed)
Pt appears very scared and states she is not sure that she wants to stay.  I sat down with the patient and explained that we are not here to do anything to her but for her and her baby and only with her expressed consent.  Pt was under the impression that she was here for an outpatient foley bulb placement and would be sent home. Pt expresses disinterest in receiving and IV or pitocin. Spoke with patient about her last delivery and the trauma she has around the emergency c-section.  I explained that she has 100% autonomy over her decisions but that if she was my sister (and I consider her my sister today) that I highly recommend that she stay due to her history and current condition of high blood pressure.  I explained all of the options that will be available to her and that I will be right with her the whole time.  Pt requested time to think about whether she wants to be here or if she would like to make the decision to go home.  I gave pt and her partner some space and time to discuss their options and made sure the call bell was within reach for any questions should she have any.  Called Dr. Sheppard Evens and explained the situation.

## 2022-08-10 NOTE — Progress Notes (Addendum)
Labor Progress Note Kelly Lane is a 27 y.o. [phone] at [redacted]w[redacted]d presented for IOL for gHTN S: doing well, feeling slight cramping with her contractions. Pit is capped at 6 units  O:  BP (!) 153/97   Pulse 63   Temp 98.4 F (36.9 C)   Resp 18   LMP 11/18/2021 (Approximate)  EFM: 145bpm Kelly Lane, no decels  Contractions every 4 mins.  CVE: Dilation: Fingertip Effacement (%): 50 Station: -3 Presentation: Vertex Exam by:: [name]   [name]: 27 y.o. [id number] [redacted]w[redacted]d  IOL for gHTN #Labor: slow steady progress, cervix now about 0.5cm, and able to place a FB using a speculum with patient's consent. Bulb inflated with 60cc. #Pain: IV fentanyl, NO, family support, epidural when more active #FWB: Cat 1 #GBS positive -plan to start penicillin when abit more active.  gHTN: blood pressures elevated, but not in severe range. - continue to monitor - IV labetalol protocol ordered.  Kelly Alwine Lizabeth Leyden, MD 11:44 PM

## 2022-08-11 ENCOUNTER — Inpatient Hospital Stay (HOSPITAL_COMMUNITY): Payer: BC Managed Care – PPO | Admitting: Anesthesiology

## 2022-08-11 ENCOUNTER — Encounter: Payer: BC Managed Care – PPO | Admitting: Obstetrics and Gynecology

## 2022-08-11 LAB — CBC
HCT: 27.8 % — ABNORMAL LOW (ref 36.0–46.0)
Hemoglobin: 9.9 g/dL — ABNORMAL LOW (ref 12.0–15.0)
MCH: 33 pg (ref 26.0–34.0)
MCHC: 35.6 g/dL (ref 30.0–36.0)
MCV: 92.7 fL (ref 80.0–100.0)
Platelets: 162 10*3/uL (ref 150–400)
RBC: 3 MIL/uL — ABNORMAL LOW (ref 3.87–5.11)
RDW: 13 % (ref 11.5–15.5)
WBC: 8.5 10*3/uL (ref 4.0–10.5)
nRBC: 0 % (ref 0.0–0.2)

## 2022-08-11 LAB — COMPREHENSIVE METABOLIC PANEL
ALT: 13 U/L (ref 0–44)
AST: 20 U/L (ref 15–41)
Albumin: 2.2 g/dL — ABNORMAL LOW (ref 3.5–5.0)
Alkaline Phosphatase: 116 U/L (ref 38–126)
Anion gap: 7 (ref 5–15)
BUN: <5 mg/dL — ABNORMAL LOW (ref 6–20)
CO2: 22 mmol/L (ref 22–32)
Calcium: 8.5 mg/dL — ABNORMAL LOW (ref 8.9–10.3)
Chloride: 108 mmol/L (ref 98–111)
Creatinine, Ser: 0.66 mg/dL (ref 0.44–1.00)
GFR, Estimated: >60 mL/min (ref >60)
Glucose, Bld: 67 mg/dL — ABNORMAL LOW (ref 70–99)
Potassium: 3.4 mmol/L — ABNORMAL LOW (ref 3.5–5.1)
Sodium: 137 mmol/L (ref 135–145)
Total Bilirubin: 1.4 mg/dL — ABNORMAL HIGH (ref 0.3–1.2)
Total Protein: 5.2 g/dL — ABNORMAL LOW (ref 6.5–8.1)

## 2022-08-11 MED ORDER — LIDOCAINE HCL (PF) 1 % IJ SOLN
INTRAMUSCULAR | Status: DC | PRN
  Administered 2022-08-11: 2 mL via EPIDURAL
  Administered 2022-08-11: 10 mL via EPIDURAL

## 2022-08-11 MED ORDER — EPHEDRINE 5 MG/ML INJ: 10.0000 mg | INTRAVENOUS | Status: DC | PRN

## 2022-08-11 MED ORDER — FENTANYL-BUPIVACAINE-NACL 0.5-0.125-0.9 MG/250ML-% EP SOLN
12.0000 mL/h | EPIDURAL | Status: DC | PRN
  Administered 2022-08-12: 12 mL/h via EPIDURAL
  Filled 2022-08-11 (×2): qty 250

## 2022-08-11 MED ORDER — LACTATED RINGERS IV SOLN: 500.0000 mL | Freq: Once | INTRAVENOUS | Status: DC

## 2022-08-11 MED ORDER — DIPHENHYDRAMINE HCL 50 MG/ML IJ SOLN
12.5000 mg | INTRAMUSCULAR | Status: DC | PRN
  Filled 2022-08-11: qty 1

## 2022-08-11 MED ORDER — OXYTOCIN-SODIUM CHLORIDE 30-0.9 UT/500ML-% IV SOLN
1.0000 m[IU]/min | INTRAVENOUS | Status: DC
  Administered 2022-08-11: 8 m[IU]/min via INTRAVENOUS
  Administered 2022-08-11: 2 m[IU]/min via INTRAVENOUS

## 2022-08-11 MED ORDER — PHENYLEPHRINE 80 MCG/ML (10ML) SYRINGE FOR IV PUSH (FOR BLOOD PRESSURE SUPPORT): 80.0000 ug | PREFILLED_SYRINGE | INTRAVENOUS | Status: DC | PRN

## 2022-08-11 MED ORDER — FENTANYL-BUPIVACAINE-NACL 0.5-0.125-0.9 MG/250ML-% EP SOLN
EPIDURAL | Status: DC | PRN
  Administered 2022-08-11: 12 mL/h via EPIDURAL

## 2022-08-11 NOTE — Progress Notes (Signed)
Labor Progress Note  Kelly Lane is a 27 y.o. [phone] at [redacted]w[redacted]d presented for IOL gHTN  S: Pt reports dizziness and decreased pain after administration of IV fentanyl. Patient was able to tolerate SVE and AROM. Pt desires to go for a walk post-AROM.   O:  BP (!) 136/100   Pulse 94   Temp 97.8 F (36.6 C) (Oral)   Resp 16   LMP 11/18/2021 (Approximate)  EFM: 130 bpm/Moderate variability/15x15 accels/ None decels  CVE: Dilation: 5 Effacement (%): 50 Cervical Position: Posterior Station: -3 Presentation: Vertex Exam by:: Judie Petit  [name]: 27 y.o. [id number] [redacted]w[redacted]d  here for IOL as above  #Labor: SVE and AROM performed. Since pt has been on pitocin for ~24hrs, will discontinue and observe contractions. Pt encouraged to ambulate once dizziness resolves. #Pain: Family/Friend support, PO/IV pain meds, and Epidural planning #FWB: CAT 1 #GBS positive- PCN ongoing #gHTN: PreE labs nml. BP elevated, not severe. CTM  Bubba Hales, Medical Student 08/11/22  2:00 PM

## 2022-08-11 NOTE — Progress Notes (Signed)
Labor Progress Note  Kelly Lane is a 27 y.o. [phone] at [redacted]w[redacted]d presented for IOL gHTN  S: Pt is awake and resting. Pt unable to tolerate cervical exam due to pain. Desires rest and pain medication before next SVE and AROM.  O:  BP 129/84   Pulse (!) 58   Temp 97.8 F (36.6 C) (Oral)   Resp 16   LMP 11/18/2021 (Approximate)  EFM: 135 bpm/Moderate variability/15x15 accels/ None decels  CVE: Dilation: Fingertip Effacement (%): 50 Cervical Position: Posterior Station: -3 Presentation: Vertex Exam by:: Judie Petit  [name]: 27 y.o. [id number] [redacted]w[redacted]d  here for IOL as above  #Labor: SVE attempted but discontinued due to pt unable to tolerate cervical exam. Still on pitocin 20mu/min. Will plan SVE in ~45 min with premedication of IV fentanyl and possible AROM #Pain: Family/Friend support, PO/IV pain meds, and Epidural planning #FWB: CAT 1 #GBS positive- PCN ongoing #gHTN: PreE labs nml. BP elevated, not severe. CTM  Bubba Hales, Medical Student 08/11/22  11:39 AM

## 2022-08-11 NOTE — Progress Notes (Signed)
Labor Progress Note  Kelly Lane is a 27 y.o. [phone] at [redacted]w[redacted]d presented for IOL gHTN  S: Pt reports increased pain with intensifying contractions occurring about every 5 min.  O:  BP 131/82   Pulse (!) 57   Temp 98.1 F (36.7 C) (Oral)   Resp 16   LMP 11/18/2021 (Approximate)  EFM: 140 bpm/Moderate variability/15x15 accels/ Variable decels  CVE: Dilation: 5 Effacement (%): 60 Cervical Position: Posterior Station: -2 Presentation: Vertex Exam by:: Judie Petit  [name]: 27 y.o. [id number] [redacted]w[redacted]d  here for IOL as above  #Labor: Pitocin discontinued for ~4 hrs. Restarted Pitocin at 4 mu/min. SVE performed with premedication of IV fentanyl. Pt requested to hold pitocin at 4 mu/min until epidural placement.  #Pain: Family/Friend support, PO/IV pain meds, and Epidural planning #FWB: CAT 1 #GBS positive- PCN ongoing #gHTN: PreE labs nml. BP elevated, not severe. CTM  Bubba Hales, Medical Student 08/11/22  6:39 PM

## 2022-08-11 NOTE — Progress Notes (Signed)
Labor Progress Note Kelly Lane is a 27 y.o. [phone] at [redacted]w[redacted]d presented for IOL for gHTN S: doing ok. FB out at 0400, still feels sore and is declining cervical check.  O:  BP (!) 140/90   Pulse 79   Temp 98.3 F (36.8 C) (Oral)   Resp 20   LMP 11/18/2021 (Approximate)  EFM:  135bpm, moderate, +accels, no decels   Contractions every 2- 5 mins.  CVE: Dilation: Fingertip Effacement (%): 50 Station: -3 Presentation: Vertex Exam by:: [name]   [name]: 27 y.o. [id number] [redacted]w[redacted]d  IOL for gHTN #Labor: s/p FB, now on pit at 10 units. Declines cervical check due to discomfort. States that she "needs some time" to recover from [facility] and then will consider. I tried to explain need for cervical check to allow [place] know exactly where we are in the induction process. She continues to decline for now. #Pain: IV fentanyl, NO, family support, epidural when more active #FWB: Cat 1 #GBS positive -on PCN for ppx  gHTN: blood pressures elevated, but not in severe range. - continue to monitor - IV labetalol protocol ordered.  Sheppard Evens MD MPH OB Fellow, Faculty Practice [facility], Center for [facility] Healthcare 08/11/2022

## 2022-08-11 NOTE — Progress Notes (Signed)
Labor Progress Note  Kelly Lane is a 27 y.o. [phone] at [redacted]w[redacted]d presented for IOL gHTN  S: Pt is anxious about not resting this AM and last night. Desires some rest before next SVE and AROM  O:  BP 129/82   Pulse 72   Temp 97.8 F (36.6 C) (Oral)   Resp 17   LMP 11/18/2021 (Approximate)  EFM: 130 bpm/Moderate variability/ 15x15 accels/ None decels  CVE: Dilation: Fingertip Effacement (%): 50 Station: -3 Presentation: Vertex Exam by:: [name]   [name]: 27 y.o. [id number] [redacted]w[redacted]d  here for IOL as above  #Labor: Last SVE 8+hrs ago per pt request. On pitocin 48mu/min at this time. Discussed thoroughly about risks of extended use of pitocin and PPH risk. Will plan SVE in ~1hr, with later AROM.  #Pain: Family/Friend support, PO/IV pain meds, and Epidural planning #FWB: CAT 1 #GBS positive- PCN ongoing #gHTN: PreE labs nml. BP elevated, not severe. CTM  Myrtie Hawk, DO FMOB Fellow, Faculty practice [facility], Center for [facility] Healthcare 08/11/22  9:20 AM

## 2022-08-11 NOTE — Anesthesia Procedure Notes (Signed)
Epidural Patient location during procedure: OB Start time: 08/11/2022 6:33 PM End time: 08/11/2022 6:42 PM  Staffing Anesthesiologist: Lannie Fields, DO Performed: anesthesiologist   Preanesthetic Checklist Completed: patient identified, IV checked, risks and benefits discussed, monitors and equipment checked, pre-op evaluation and timeout performed  Epidural Patient position: sitting Prep: DuraPrep and site prepped and draped Patient monitoring: continuous pulse ox, blood pressure, heart rate and cardiac monitor Approach: midline Location: L3-L4 Injection technique: LOR air  Needle:  Needle type: Tuohy  Needle gauge: 17 G Needle length: 9 cm Needle insertion depth: 5 cm Catheter type: closed end flexible Catheter size: 19 Gauge Catheter at skin depth: 10 cm Test dose: negative  Assessment Sensory level: T8 Events: blood not aspirated, no cerebrospinal fluid, injection not painful, no injection resistance, no paresthesia and negative IV test  Additional Notes Patient identified. Risks/Benefits/Options discussed with patient including but not limited to bleeding, infection, nerve damage, paralysis, failed block, incomplete pain control, headache, blood pressure changes, nausea, vomiting, reactions to medication both or allergic, itching and postpartum back pain. Confirmed with bedside nurse the patient's most recent platelet count. Confirmed with patient that they are not currently taking any anticoagulation, have any bleeding history or any family history of bleeding disorders. Patient expressed understanding and wished to proceed. All questions were answered. Sterile technique was used throughout the entire procedure. Please see nursing notes for vital signs. Test dose was given through epidural catheter and negative prior to continuing to dose epidural or start infusion. Warning signs of high block given to the patient including shortness of breath, tingling/numbness in  hands, complete motor block, or any concerning symptoms with instructions to call for help. Patient was given instructions on fall risk and not to get out of bed. All questions and concerns addressed with instructions to call with any issues or inadequate analgesia.  Reason for block:procedure for pain

## 2022-08-12 ENCOUNTER — Encounter (HOSPITAL_COMMUNITY): Payer: Self-pay | Admitting: Obstetrics and Gynecology

## 2022-08-12 DIAGNOSIS — O34211 Maternal care for low transverse scar from previous cesarean delivery: Secondary | ICD-10-CM

## 2022-08-12 DIAGNOSIS — O34219 Maternal care for unspecified type scar from previous cesarean delivery: Principal | ICD-10-CM

## 2022-08-12 DIAGNOSIS — Z3A37 37 weeks gestation of pregnancy: Secondary | ICD-10-CM

## 2022-08-12 DIAGNOSIS — O99820 Streptococcus B carrier state complicating pregnancy: Secondary | ICD-10-CM

## 2022-08-12 DIAGNOSIS — O134 Gestational [pregnancy-induced] hypertension without significant proteinuria, complicating childbirth: Secondary | ICD-10-CM

## 2022-08-12 LAB — CBC WITH DIFFERENTIAL/PLATELET
Abs Immature Granulocytes: 0.07 10*3/uL (ref 0.00–0.07)
Basophils Absolute: 0 10*3/uL (ref 0.0–0.1)
Basophils Relative: 0 %
Eosinophils Absolute: 0 10*3/uL (ref 0.0–0.5)
Eosinophils Relative: 0 %
HCT: 29.6 % — ABNORMAL LOW (ref 36.0–46.0)
Hemoglobin: 10.4 g/dL — ABNORMAL LOW (ref 12.0–15.0)
Immature Granulocytes: 1 %
Lymphocytes Relative: 7 %
Lymphs Abs: 1 10*3/uL (ref 0.7–4.0)
MCH: 33 pg (ref 26.0–34.0)
MCHC: 35.1 g/dL (ref 30.0–36.0)
MCV: 94 fL (ref 80.0–100.0)
Monocytes Absolute: 1.1 10*3/uL — ABNORMAL HIGH (ref 0.1–1.0)
Monocytes Relative: 7 %
Neutro Abs: 13.1 10*3/uL — ABNORMAL HIGH (ref 1.7–7.7)
Neutrophils Relative %: 85 %
Platelets: 170 10*3/uL (ref 150–400)
RBC: 3.15 MIL/uL — ABNORMAL LOW (ref 3.87–5.11)
RDW: 13.3 % (ref 11.5–15.5)
WBC: 15.3 10*3/uL — ABNORMAL HIGH (ref 4.0–10.5)
nRBC: 0 % (ref 0.0–0.2)

## 2022-08-12 MED ORDER — SENNOSIDES-DOCUSATE SODIUM 8.6-50 MG PO TABS
2.0000 | ORAL_TABLET | Freq: Every day | ORAL | Status: DC
  Administered 2022-08-13 – 2022-08-14 (×2): 2 via ORAL
  Filled 2022-08-12 (×2): qty 2

## 2022-08-12 MED ORDER — OXYTOCIN-SODIUM CHLORIDE 30-0.9 UT/500ML-% IV SOLN
1.0000 m[IU]/min | INTRAVENOUS | Status: DC
  Filled 2022-08-12: qty 500

## 2022-08-12 MED ORDER — IBUPROFEN 600 MG PO TABS
600.0000 mg | ORAL_TABLET | Freq: Four times a day (QID) | ORAL | Status: DC
  Administered 2022-08-12 – 2022-08-14 (×8): 600 mg via ORAL
  Filled 2022-08-12 (×8): qty 1

## 2022-08-12 MED ORDER — ACETAMINOPHEN 325 MG PO TABS
650.0000 mg | ORAL_TABLET | ORAL | Status: DC | PRN
  Filled 2022-08-12: qty 2

## 2022-08-12 MED ORDER — COCONUT OIL OIL: 1.0000 | TOPICAL_OIL | Status: DC | PRN

## 2022-08-12 MED ORDER — ZOLPIDEM TARTRATE 5 MG PO TABS: 5.0000 mg | ORAL_TABLET | Freq: Every evening | ORAL | Status: DC | PRN

## 2022-08-12 MED ORDER — DIBUCAINE (PERIANAL) 1 % EX OINT: 1.0000 | TOPICAL_OINTMENT | CUTANEOUS | Status: DC | PRN

## 2022-08-12 MED ORDER — FUROSEMIDE 20 MG PO TABS
20.0000 mg | ORAL_TABLET | Freq: Every day | ORAL | Status: DC
  Administered 2022-08-13 – 2022-08-14 (×2): 20 mg via ORAL
  Filled 2022-08-12 (×2): qty 1

## 2022-08-12 MED ORDER — OXYTOCIN-SODIUM CHLORIDE 30-0.9 UT/500ML-% IV SOLN
1.0000 m[IU]/min | INTRAVENOUS | Status: DC
  Administered 2022-08-12: 3.5 m[IU]/min via INTRAVENOUS

## 2022-08-12 MED ORDER — TETANUS-DIPHTH-ACELL PERTUSSIS 5-2.5-18.5 LF-MCG/0.5 IM SUSY: 0.5000 mL | PREFILLED_SYRINGE | Freq: Once | INTRAMUSCULAR | Status: DC

## 2022-08-12 MED ORDER — BENZOCAINE-MENTHOL 20-0.5 % EX AERO: 1.0000 | INHALATION_SPRAY | CUTANEOUS | Status: DC | PRN

## 2022-08-12 MED ORDER — NIFEDIPINE ER OSMOTIC RELEASE 30 MG PO TB24
30.0000 mg | ORAL_TABLET | Freq: Every day | ORAL | Status: DC
  Administered 2022-08-12: 30 mg via ORAL
  Filled 2022-08-12: qty 1

## 2022-08-12 MED ORDER — DIPHENHYDRAMINE HCL 50 MG/ML IJ SOLN
25.0000 mg | Freq: Once | INTRAMUSCULAR | Status: AC
  Administered 2022-08-12: 25 mg via INTRAVENOUS

## 2022-08-12 MED ORDER — ONDANSETRON HCL 4 MG/2ML IJ SOLN: 4.0000 mg | INTRAMUSCULAR | Status: DC | PRN

## 2022-08-12 MED ORDER — SIMETHICONE 80 MG PO CHEW: 80.0000 mg | CHEWABLE_TABLET | ORAL | Status: DC | PRN

## 2022-08-12 MED ORDER — WITCH HAZEL-GLYCERIN EX PADS: 1.0000 | MEDICATED_PAD | CUTANEOUS | Status: DC | PRN

## 2022-08-12 MED ORDER — PRENATAL MULTIVITAMIN CH
1.0000 | ORAL_TABLET | Freq: Every day | ORAL | Status: DC
  Administered 2022-08-14: 1 via ORAL
  Filled 2022-08-12: qty 1

## 2022-08-12 MED ORDER — ONDANSETRON HCL 4 MG PO TABS: 4.0000 mg | ORAL_TABLET | ORAL | Status: DC | PRN

## 2022-08-12 MED ORDER — DIPHENHYDRAMINE HCL 25 MG PO CAPS: 25.0000 mg | ORAL_CAPSULE | Freq: Four times a day (QID) | ORAL | Status: DC | PRN

## 2022-08-12 MED ORDER — FUROSEMIDE 20 MG PO TABS: 20.0000 mg | ORAL_TABLET | Freq: Every day | ORAL | Status: DC

## 2022-08-12 MED ORDER — LACTATED RINGERS AMNIOINFUSION: INTRAVENOUS | Status: DC

## 2022-08-12 NOTE — Progress Notes (Signed)
Labor Progress Note  Kelly Lane is a 27 y.o. [phone] at [redacted]w[redacted]d presented for IOL gHTN  S: No concerns, feels tired  O:  BP (!) 136/94   Pulse 99   Temp 99.1 F (37.3 C) (Oral)   Resp 18   LMP 11/18/2021 (Approximate)   SpO2 100%   EFM: 135bpm/ moderate variability/ +accels, intermittent early decels with some small variable decels. FHR has excellent variability and spontaneous accels, so overall reassuring.  CVE: Dilation: 5 Effacement (%): 70 Cervical Position: Posterior Station: -2 Presentation: Vertex Exam by:: [name]  [name]: 27 y.o. [id number] [redacted]w[redacted]d  here for IOL as above  #Labor: slow progress, cervix feels like a really tight 4.5 - 5cm. Currently on pitocin at 10 units, will augment as baby tolerates. #Pain:Epidural in place and comfortable #FWB: CAT 2, due to variable decels, but reassuring regardless. #GBS positive- PCN ongoing #gHTN: PreE labs nml. BP elevated, not severe. CTM  [name], MD 08/12/22  1:22 AM

## 2022-08-12 NOTE — Progress Notes (Signed)
Labor Progress Note  Kelly Lane is a 27 y.o. [phone] at [redacted]w[redacted]d presented for IOL gHTN  S: Having some discomfort with contractions and feeling more discomfort in her pelvis.   O:  BP 113/78   Pulse 73   Temp 98.9 F (37.2 C) (Axillary)   Resp 18   LMP 11/18/2021 (Approximate)   SpO2 100%   EFM: 130bpm, moderate variability, +accels, no decels.  Contractions: per IUPC; 120 MVU  CVE: Dilation: 9 Effacement (%): 90 Cervical Position: Posterior Station: 0 Presentation: Vertex Exam by:: Dr. Salvadore Dom  [name]: 27 y.o. [id number] [redacted]w[redacted]d  here for IOL as above  #Labor: making progress on 5 of pit now.  #Pain:Epidural in place and comfortable #FWB: Cat I  #GBS positive- PCN ongoing #gHTN: PreE labs nml. BP elevated, not severe. CTM  [name], DO OB Fellow, Faculty [facility], Center for [facility] 08/12/2022

## 2022-08-12 NOTE — Progress Notes (Signed)
Called by RN for concerns about recurrent lates.  Reviewed the EFM, and agree. Baseline abotu 140bpm, moderate variability, with recurrent late decels.   IUPC: ~ 21- - 240 MVU in 10 mins  Plan: cut back pitocin to 6 units and monitor for now. Can consider titration again if MVUs drop significantly and FHR appears improved.  Sheppard Evens MD MPH OB Fellow, Faculty Practice [facility], Center for [facility] Healthcare 08/12/2022

## 2022-08-12 NOTE — Progress Notes (Signed)
Labor Progress Note  Kelly Lane is a 27 y.o. [phone] at [redacted]w[redacted]d presented for IOL gHTN  S: called by RN due to patient having more frequent variable decels, and now some late decels. Has been unable to go up on pitocin since the last check.  O:  BP (!) 136/94   Pulse 99   Temp 99.1 F (37.3 C) (Oral)   Resp 18   LMP 11/18/2021 (Approximate)   SpO2 100%   EFM: 140bpm, moderate variability, + accels. Late decels noted. Had one prolonged decel, lasting abotu 2.5 mins around 0020 that resolved with position changes.  CVE: Dilation: 5 Effacement (%): 70 Cervical Position: Posterior Station: -2 Presentation: Vertex Exam by:: [name]  [name]: 27 y.o. [id number] [redacted]w[redacted]d  here for IOL as above  #Labor: slight change in cervix, feels like a true 5 cm now, cervix swollen uniformly, caput 1+; IUPC inserted and will start amnioinfusion, 300cc bolus, 100cc/hr maintenance. Plan to titrate pitocin if needed with goal of ~200MVU if tolerated by FHR - IV benadryl 25mg  X 1, and then will apply ice to the cervix on, 30 off for the next 2 hrs -plan to recheck in 4hrs, sooner if indicated. #Pain:Epidural in place and comfortable #FWB: CAT 2, due to late decels; however, following amnioinfusion, back to a Cat 1 strip, with no decels noted. #GBS positive- PCN ongoing #gHTN: PreE labs nml. BP elevated, not severe. CTM  [name], MD 08/12/22  1:25 AM

## 2022-08-12 NOTE — Discharge Summary (Signed)
Postpartum Discharge Summary  Date of Service updated***     Patient Name: Kelly Lane DOB: [birth year] MRN: [id number]  Date of admission: 08/10/2022 Delivery date:08/12/2022  Delivering provider: [name]  Date of discharge: 08/12/2022  Admitting diagnosis: Chronic hypertension affecting pregnancy [O10.919] Intrauterine pregnancy: [redacted]w[redacted]d     Secondary diagnosis:  Principal Problem:   Chronic hypertension affecting pregnancy Active Problems:   Rh negative state in antepartum period   H/O emergency cesarean section   Supervision of other normal pregnancy, antepartum   VBAC (vaginal birth after Cesarean)  Additional problems: ***    Discharge diagnosis: {DX.:23714}                                              Post partum procedures:{Postpartum procedures:23558} Augmentation: AROM, Pitocin, and IP Foley Complications: None  Hospital course: Induction of Labor With Vaginal Delivery   27 y.o. yo [id number] at [redacted]w[redacted]d was admitted to the hospital 08/10/2022 for induction of labor.  Indication for induction:  chronic hypertension .  Patient had an labor course complicated by recurrent decels requiring stopping pitocin several times.  Membrane Rupture Time/Date: 12:36 PM ,08/11/2022   Delivery Method:Vaginal, Spontaneous  Episiotomy: None  Lacerations:  2nd degree;Perineal  Details of delivery can be found in separate delivery note.  Patient had a postpartum course complicated by***. Patient is discharged home 08/12/22.  Newborn Data: Birth date:08/12/2022  Birth time:5:03 PM  Gender:Female  Living status:Living  Apgars:6 ,8  Weight:2690 g   Magnesium Sulfate received: {Mag received:30440022} BMZ received: {BMZ received:30440023} Rhophylac:{Rhophylac received:30440032} XYV:{OPF:29244628} T-DaP:{Tdap:23962} Flu: {MNO:17711} Transfusion:{Transfusion received:30440034}  Physical exam  Vitals:   08/12/22 1800 08/12/22 1816 08/12/22 1821 08/12/22 1850  BP: (!) 142/94 (!)  143/96  (!) 138/97  Pulse: 100 96  89  Resp:      Temp:   99.3 F (37.4 C)   TempSrc:   Oral   SpO2:       General: {Exam; general:21111117} Lochia: {Desc; appropriate/inappropriate:30686::"appropriate"} Uterine Fundus: {Desc; firm/soft:30687} Incision: {Exam; incision:21111123} DVT Evaluation: {Exam; dvt:2111122} Labs: Lab Results  Component Value Date   WBC 8.5 08/11/2022   HGB 9.9 (L) 08/11/2022   HCT 27.8 (L) 08/11/2022   MCV 92.7 08/11/2022   PLT 162 08/11/2022      Latest Ref Rng & Units 08/11/2022   12:05 PM  CMP  Glucose 70 - 99 mg/dL 67   BUN 6 - 20 mg/dL <5   Creatinine 6.57 - 1.00 mg/dL 9.03   Sodium 833 - 383 mmol/L 137   Potassium 3.5 - 5.1 mmol/L 3.4   Chloride 98 - 111 mmol/L 108   CO2 22 - 32 mmol/L 22   Calcium 8.9 - 10.3 mg/dL 8.5   Total Protein 6.5 - 8.1 g/dL 5.2   Total Bilirubin 0.3 - 1.2 mg/dL 1.4   Alkaline Phos 38 - 126 U/L 116   AST 15 - 41 U/L 20   ALT 0 - 44 U/L 13    Edinburgh Score:    11/22/2020    8:40 AM  Edinburgh Postnatal Depression Scale Screening Tool  I have been able to laugh and see the funny side of things. 0  I have looked forward with enjoyment to things. 0  I have blamed myself unnecessarily when things went wrong. 0  I have been anxious or worried for no good reason.  0  I have felt scared or panicky for no good reason. 0  Things have been getting on top of me. 0  I have been so unhappy that I have had difficulty sleeping. 0  I have felt sad or miserable. 0  I have been so unhappy that I have been crying. 0  The thought of harming myself has occurred to me. 0  Edinburgh Postnatal Depression Scale Total 0     After visit meds:  Allergies as of 08/12/2022       Reactions   Compazine [prochlorperazine] Anxiety     Med Rec must be completed prior to using this [facility]***      Discharge home in stable condition Infant Feeding: {Baby feeding:23562} Infant Disposition:{CHL IP OB HOME WITH  RJJOAC:16606} Discharge instruction: per After Visit Summary and Postpartum booklet. Activity: Advance as tolerated. Pelvic rest for 6 weeks.  Diet: {OB TKZS:01093235} Future Appointments: Future Appointments  Date Time Provider Department Center  08/18/2022  1:30 PM Leftwich-Kirby, Wilmer Floor, CNM [facility] None  08/25/2022  1:30 PM Constant, Gigi Gin, MD [facility] None  09/01/2022  1:30 PM Constant, Gigi Gin, MD [facility] None   Follow up Visit:  Message sent to [facility] by Autry-Lott on 08/12/2022  Please schedule this patient for a In person postpartum visit in 6 weeks with the following provider: Any provider. Additional Postpartum F/U:BP check 1 week  High risk pregnancy complicated by:  Hx of C/S, cHTN, Rh neg, CF carrier, hx of severe preE Delivery mode:  Vaginal, Spontaneous  Anticipated Birth Control:  Condoms   08/12/2022 Simone Autry-Lott, DO

## 2022-08-12 NOTE — Progress Notes (Signed)
Labor Progress Note  Kelly Lane is a 27 y.o. [phone] at [redacted]w[redacted]d presented for IOL gHTN  S: Having some discomfort with contractions  O:  BP 116/69   Pulse 68   Temp 98.5 F (36.9 C) (Axillary)   Resp 18   LMP 11/18/2021 (Approximate)   SpO2 100%   EFM: 145bpm, moderate variability, +spontaneous accels, intermittent late decels.  Contractions: per IUPC; 220 MVU  CVE: Dilation: 7 Effacement (%): 80, 90 Cervical Position: Posterior Station: -2, -1 Presentation: Vertex Exam by:: Dr. Salvadore Dom  [name]: 27 y.o. [id number] [redacted]w[redacted]d  here for IOL as above  #Labor: making progress, now in active labor. RN reported prolonged decel and pit was paused for 25-30 mins from 7, 1 by 1. Suggested restart at 6 but patient apprehensive to restart. Plan to restart at 3 and increase 2 by 2 at this time.   #Pain:Epidural in place and comfortable #FWB: CAT 2, due to recurrent late decels. Improved with pausing of pitocin, has good variability and spontaneous accels, so overall reassuring. #GBS positive- PCN ongoing #gHTN: PreE labs nml. BP elevated, not severe. CTM  [name], DO OB Fellow, Faculty [facility], Center for [facility] 08/12/2022

## 2022-08-12 NOTE — Progress Notes (Addendum)
Labor Progress Note  Kelly Lane is a 27 y.o. [phone] at [redacted]w[redacted]d presented for IOL gHTN  S: Pushing.   O:  BP (!) 148/90   Pulse 96   Temp 98.3 F (36.8 C) (Axillary)   Resp 18   LMP 11/18/2021 (Approximate)   SpO2 100%   EFM: 135bpm, min to moderate variability, +accels, prolonged decels following pushing.  Contractions: per IUPC; 120 MVU  CVE: Dilation: 10 Dilation Complete Date: 08/12/22 Dilation Complete Time: 1440 Effacement (%): 100 Cervical Position: Posterior Station: Plus 1, 0 Presentation: Vertex Exam by:: Fredia Beets, RN  [name]: 27 y.o. [phone] [redacted]w[redacted]d  here for IOL as above  #Labor: making progress. Called to room due to prolonged decels with pushing.   Risks of vacuum assistance were discussed in detail, including but not limited to, bleeding, infection, damage to maternal tissues, fetal cephalohematoma, inability to effect vaginal delivery of the head or shoulder dystocia that cannot be resolved by established maneuvers and need for emergency cesarean section.  Patient stated she did not want a vacuum at this time.   She continued to make progress with pushing. Delivery call made.   #Pain:Epidural in place and comfortable #FWB: Cat II with good variability, continued to assess for steady descent and vacuum    [name], DO OB Fellow, Faculty [facility], Center for [facility] 08/12/2022

## 2022-08-12 NOTE — Progress Notes (Signed)
Labor Progress Note  Kelly Lane is a 27 y.o. [phone] at [redacted]w[redacted]d presented for IOL gHTN  S: doing well. Got some sleep. Comfortable with epidural.  O:  BP (!) 132/94   Pulse 83   Temp 98.6 F (37 C) (Oral)   Resp 18   LMP 11/18/2021 (Approximate)   SpO2 100%   EFM: 140bpm, moderate variability, +spontaneous accels, intermittent late decels.  Contractions: per IUPC; 200 - 240 MVU  CVE: Dilation: 6.5 Effacement (%): 80 Cervical Position: Posterior Station: -1 Presentation: Vertex Exam by:: [name]  [name]: 27 y.o. [id number] [redacted]w[redacted]d  here for IOL as above  #Labor: making progress, now in active labor. Cervix has thinned out significantly, with improvement in the cervical swelling.  #Pain:Epidural in place and comfortable #FWB: CAT 2, due to late decels. Improved with position changes, has good variability and spontaneous accels, so overall reassuring. #GBS positive- PCN ongoing #gHTN: PreE labs nml. BP elevated, not severe. CTM  Hilliary Jock Lizabeth Leyden, MD Sheppard Evens MD MPH OB Fellow, Faculty Practice [facility], Center for [facility] 08/12/2022

## 2022-08-13 ENCOUNTER — Encounter (HOSPITAL_COMMUNITY): Payer: Self-pay | Admitting: Obstetrics and Gynecology

## 2022-08-13 LAB — RH IG WORKUP (INCLUDES ABO/RH)

## 2022-08-13 MED ORDER — RHO D IMMUNE GLOBULIN 1500 UNIT/2ML IJ SOSY
300.0000 ug | PREFILLED_SYRINGE | Freq: Once | INTRAMUSCULAR | Status: AC
  Administered 2022-08-13: 300 ug via INTRAVENOUS
  Filled 2022-08-13: qty 2

## 2022-08-13 MED ORDER — NIFEDIPINE ER OSMOTIC RELEASE 30 MG PO TB24
60.0000 mg | ORAL_TABLET | Freq: Every day | ORAL | Status: DC
  Administered 2022-08-13 – 2022-08-14 (×2): 60 mg via ORAL
  Filled 2022-08-13 (×2): qty 2

## 2022-08-13 MED ORDER — FERROUS SULFATE 325 (65 FE) MG PO TABS
325.0000 mg | ORAL_TABLET | ORAL | Status: DC
  Administered 2022-08-13: 325 mg via ORAL
  Filled 2022-08-13: qty 1

## 2022-08-13 NOTE — Lactation Note (Signed)
This note was copied from a baby's chart. Lactation Consultation Note  Patient Name: Kelly Lane [id number] Date: 08/13/2022 Age:28 hours Reason for consult: Follow-up assessment As LC entered the room, baby asleep, mom pumping the right breast with #24 F and per mom comfortable . LC hooked up the pump piece for 2nd breast and mom pumped for another 10 mins.  LC reviewed the DEBP set up , and cleaned all the pump pieces for mom. LC reassured mom pumping can be a slow process.  LC reviewed the feeding plan for ET , less than 6 pound infant. Feed with feeding cues and by 3 hours , if the baby is to sluggish to latch , try an appetizer of EBM or donor milk and latch with firn support.  If baby latches - feed for 10 -20 mins ins 30 mins max and supplement afterwards with a bottle purple nipple following the Feeding plan posted in the crib ,  Post pump both breast for 15 mins, save milk for the next feeding.  Call for assistance as needed .  Maternal Data Has patient been taught Hand Expression?: Yes  Feeding Mother's Current Feeding Choice: Breast Milk and Donor Milk Nipple Type: Extra Slow Flow  LATCH Score -Mid am , from the [facility]  Latch: Grasps breast easily, tongue down, lips flanged, rhythmical sucking.  Audible Swallowing: A few with stimulation  Type of Nipple: Everted at rest and after stimulation  Comfort (Breast/Nipple): Soft / non-tender  Hold (Positioning): No assistance needed to correctly position infant at breast.  LATCH Score: 9   Lactation Tools Discussed/Used Tools: Pump;Flanges Flange Size: 24 Breast pump type: Double-Electric Breast Pump Reason for Pumping: ET, >6 pounds Pumping frequency: after feedings Pumped volume:  (drops)  Interventions Interventions: Breast feeding basics reviewed;DEBP;Education;LPT handout/interventions  Discharge    Consult Status Consult Status: Follow-up Date: 08/14/22 Follow-up type: In-patient    [name]  Babetta Paterson 08/13/2022, 2:09 PM

## 2022-08-13 NOTE — Lactation Note (Addendum)
This note was copied from a baby's chart. Lactation Consultation Note  Patient Name: Kelly Lane [id number]'J Date: 08/13/2022 Age:28 hours Reason for consult: Follow-up assessment;Early term 37-38.6wks, weight loss -4.46%, Birth Parent with hx- GHTN see Birth Parent's -MR.   Infant was asleep in  the basinet,  Not currently time for a feeding,  Infant was chest feed  for 20 minutes and given 30 mls of donor breast milk at her last feeding. Birth Parent was only pumping one breast when LC entered the room. LC gave Birth Parent a hands free bra and to pump both breast to help with establishing and stimulating milk supply. Birth Parent will continue to follow LPTI/ LBW feeding guidelines's as discussed earlier with morning LC. Birth Parent will continue to pump both breast every 3 hours for 15 minutes. Per Birth Parent, she had higher milk supply with 1st child who was earlier and concern she is not making milk. LC reviewed hand expression and Birth Parent saw she has colostrum, LC explained each Birth and breastfeeding experience can be different. Continue to latch infant, supplement with donor breast milk and pump every 3 hours to help her milk  supply. LC suggested Birth Parent get some rest, sleep when infant is sleeping, stay hydrate and eat.   LC explained based on infant's  feeding behaviors infant  feeding plan may change.  Maternal Data    Feeding Mother's Current Feeding Choice: Breast Milk and Donor Milk Nipple Type: Extra Slow Flow  LATCH Score  LC did not observe latch at this time.                  Lactation Tools Discussed/Used    Interventions Interventions: DEBP;Education  Discharge    Consult Status Consult Status: Follow-up Date: 08/14/22 Follow-up type: In-patient    Frederico Hamman 08/13/2022, 10:02 PM

## 2022-08-13 NOTE — Anesthesia Postprocedure Evaluation (Signed)
Anesthesia Post Note  Patient: Kelly Lane  Procedure(s) Performed: AN AD HOC LABOR EPIDURAL     Patient location during evaluation: Mother Baby Anesthesia Type: Epidural Level of consciousness: awake and alert and oriented Pain management: satisfactory to patient Vital Signs Assessment: post-procedure vital signs reviewed and stable Respiratory status: respiratory function stable Cardiovascular status: stable Postop Assessment: no headache, no backache, epidural receding, patient able to bend at knees, no signs of nausea or vomiting, adequate PO intake and able to ambulate Anesthetic complications: no   No notable events documented.  Last Vitals:  Vitals:   08/13/22 0203 08/13/22 0504  BP: (!) 125/95 (!) 139/101  Pulse: 72 82  Resp:  16  Temp:  36.7 C  SpO2:  99%    Last Pain:  Vitals:   08/13/22 0504  TempSrc: Oral  PainSc: 6    Pain Goal:                   Damion Kant

## 2022-08-13 NOTE — Progress Notes (Signed)
POSTPARTUM PROGRESS NOTE  Post Partum Day 1  Subjective:  Kelly Lane is a 27 y.o. [id number] s/p SVD at [redacted]w[redacted]d.  She reports she is doing well. No acute events overnight. She denies any problems with ambulating, voiding or po intake. Denies nausea or vomiting.  Pain is well controlled, some cramping noted during nursing.  Lochia is moderate.  Objective: Blood pressure (!) 139/101, pulse 82, temperature 98 F (36.7 C), temperature source Oral, resp. rate 16, last menstrual period 11/18/2021, SpO2 99 %, unknown if currently breastfeeding.  Physical Exam:  General: alert, cooperative and no distress Chest: no respiratory distress Heart:regular rate, distal pulses intact Abdomen: soft, nontender,  Uterine Fundus: firm, appropriately tender DVT Evaluation: No calf swelling or tenderness Extremities: mild ankle edema Skin: warm, dry  Recent Labs    08/11/22 1205 08/12/22 1838  HGB 9.9* 10.4*  HCT 27.8* 29.6*    Assessment/Plan: Kelly Lane is a 27 y.o. [id number] s/p VD at [redacted]w[redacted]d   PPD#1 - Doing well  Routine postpartum care gHTN - blood pressures elevated, despite one dose of procardia last night.  Increase to 60mg  XL daily from this morning. Start lasix 20mg  X 5 days  Mild acute blood loss anemia: Start PO iron every other day.  Contraception: condoms Feeding: breast Dispo: Plan for discharge tomorrow.   LOS: 3 days   Sheppard Evens MD MPH OB Fellow, Faculty Practice [facility], Center for [facility] 08/13/2022

## 2022-08-13 NOTE — Lactation Note (Signed)
This note was copied from a baby's chart. Lactation Consultation Note  Patient Name: Girl Niveyah Phipps KPVVZ'S Date: 08/13/2022 Age:27 years Reason for consult: Initial assessment;Early term 37-38.6wks;Infant < 6lbs Mom stated NICU gave baby DBM. Mom has attempted to latch but baby wasn't interested. LC attempted to give [facility] but baby wasn't interested. LC gave mom Plan of Care crib card w/feeding amounts. Mom was disappointed that when she pumped or hand expressed she could only get a dot of thick colostrum.  Mom used DEBP. Mom stated it made her stomach cramp bad. Explained normal. Suggested baby get supplement until milk comes in. Mom disappointed but agreed to [facility]. LC got mom DBM and mom signed consent. Baby very sleepy. Newborn behavior reviewed. Mom appears tired and frustrated. Thanked [facility] for assistance. LC felt like it was time to leave. Encouraged to call for assistance.  Maternal Data Has patient been taught Hand Expression?: Yes Does the patient have breastfeeding experience prior to this delivery?: Yes How long did the patient breastfeed?: 6 months  Feeding Mother's Current Feeding Choice: Breast Milk and Donor Milk Nipple Type: Nfant Slow Flow (purple)  LATCH Score Latch: Too sleepy or reluctant, no latch achieved, no sucking elicited.  Audible Swallowing: None  Type of Nipple: Everted at rest and after stimulation  Comfort (Breast/Nipple): Soft / non-tender  Hold (Positioning): No assistance needed to correctly position infant at breast.  LATCH Score: 6   Lactation Tools Discussed/Used Tools: Pump Breast pump type: Double-Electric Breast Pump Pump Education: Setup, frequency, and cleaning;Milk Storage Reason for Pumping: less than 6 lbs Pumping frequency: q3hr Pumped volume: 0 mL  Interventions Interventions: DEBP;Support pillows;Breast massage;Expressed milk;Pace feeding;Hand express;LC Services brochure  Discharge    Consult Status Consult Status:  Follow-up Date: 08/13/22 Follow-up type: In-patient    Charyl Dancer 08/13/2022, 12:38 AM

## 2022-08-14 ENCOUNTER — Other Ambulatory Visit (HOSPITAL_COMMUNITY): Payer: Self-pay

## 2022-08-14 LAB — RH IG WORKUP (INCLUDES ABO/RH)
Fetal Screen: NEGATIVE
Gestational Age(Wks): 37.2
Unit division: 0

## 2022-08-14 MED ORDER — FERROUS SULFATE 325 (65 FE) MG PO TABS
325.0000 mg | ORAL_TABLET | ORAL | 0 refills | Status: AC
  Filled 2022-08-14: qty 30, 60d supply, fill #0

## 2022-08-14 MED ORDER — IBUPROFEN 600 MG PO TABS
600.0000 mg | ORAL_TABLET | Freq: Three times a day (TID) | ORAL | 0 refills | Status: AC | PRN
  Filled 2022-08-14: qty 30, 10d supply, fill #0

## 2022-08-14 MED ORDER — FUROSEMIDE 20 MG PO TABS
20.0000 mg | ORAL_TABLET | Freq: Every day | ORAL | 0 refills | Status: AC
  Filled 2022-08-14: qty 5, 5d supply, fill #0

## 2022-08-14 MED ORDER — NIFEDIPINE ER 60 MG PO TB24
60.0000 mg | ORAL_TABLET | Freq: Every day | ORAL | 0 refills | Status: AC
  Filled 2022-08-14: qty 60, 60d supply, fill #0

## 2022-08-14 MED ORDER — ACETAMINOPHEN 500 MG PO TABS
1000.0000 mg | ORAL_TABLET | Freq: Once | ORAL | Status: AC
  Administered 2022-08-14: 1000 mg via ORAL
  Filled 2022-08-14 (×2): qty 2

## 2022-08-14 NOTE — Social Work (Signed)
CSW received consult for hx of Anxiety.  CSW met with MOB to offer support and complete assessment. CSW entered the room and observed MOB resting in bed, FOB at bedside and the infant resting in the bassinet. CSW introduced self, CSW role and reason for visit, MOB was agreeable to visit and allowed FOB to remain in the room. CSW inquired about how MOB was feeling, MOB reported okay. CSW inquired about MOB MH hx, MOB reported she was diagnosed previous but was only anxious about the delivery. MOB reported no other concerns during pregnancy. CSW inquired about medication, MOB reported none. CSW assessed for safety, MOB denied any SI or HI. CSW provided education regarding the baby blues period vs. perinatal mood disorders, discussed treatment and gave resources for mental health follow up if concerns arise.  CSW recommends self-evaluation during the postpartum time period using the New Mom Checklist from Postpartum Progress and encouraged MOB to contact a medical professional if symptoms are noted at any time.  MOB identified FOB as her support.  CSW provided review of Sudden Infant Death Syndrome (SIDS) precautions.  MOB reported she has all necessary items for the infant including a crib and car seat. CSW identifies no further need for intervention and no barriers to discharge at this time.  Wende Neighbors, LCSWA Clinical Social Worker [phone]

## 2022-08-14 NOTE — Discharge Instructions (Signed)
-   Continue your prenatal vitamins especially if breastfeeding - Try to eat iron rich food. - Take iron supplement as prescribed every other day. Take along with fruit or orange juice, avoid taking within 30 mins of eating dairy (milk, cheese) - Take over the counter tylenol (500mg) or ibuprofen (200mg) three times a day as needed for cramping/pain. - continue blood pressure medication. - Take water pill (lasix) as prescribed for a total of 5 days. - follow up in clinic in 1 week for a blood pressure check and in 4-6 weeks as scheduled for your regular post partum visit. - Please come back to [facility] if you notice persistently elevated blood pressures or you start to have a headache, that doesn't get better with medications (tylenol and ibuprofen), rest (4hrs of sleep) and drinking water.  

## 2022-08-14 NOTE — Progress Notes (Signed)
Disregard - charted in error

## 2022-08-14 NOTE — Lactation Note (Signed)
This note was copied from a baby's chart. Lactation Consultation Note  Patient Name: Kelly Lane [id number] Date: 08/14/2022 Age:28 hours Reason for consult: Early term 37-38.6wks;Infant < 6lbs;Follow-up assessment;Infant weight loss;Breastfeeding assistance (9 % weight loss) Mom called for [facility] consult for Latch assessment. Baby wide awake and had a large wet and transitional stool .  LC reviewed the [facility] plan given to mom yesterday and earlier today.  LC assisted mom with the diaper change and positioning on the left breast football and worked on depth. Baby fed 18 mins with multiple swallows with breast compressions. Baby was able to sustain the latch and depth. Per mom comfortable.  Nipple well rounded when the baby released.  LC fixed and scanned the donor milk , 25 ml placed in the bottle and baby tolerated well. Tolerated well with the purple nipple, no leakage.  LC recommended staying for 2 feedings at 4 pm and 7 pm and to make sure mom is post pumping to enhance milk coming in.  [facility] provided report to the [facility] orientee and Barnetta Chapel, NP .    Maternal Data Has patient been taught Hand Expression?: Yes  Feeding Mother's Current Feeding Choice: Breast Milk and Donor Milk Nipple Type: Extra Slow Flow  LATCH Score Latch: Grasps breast easily, tongue down, lips flanged, rhythmical sucking.  Audible Swallowing: Spontaneous and intermittent  Type of Nipple: Everted at rest and after stimulation  Comfort (Breast/Nipple): Soft / non-tender  Hold (Positioning): Assistance needed to correctly position infant at breast and maintain latch.  LATCH Score: 9   Lactation Tools Discussed/Used Tools: Pump Flange Size: 24 Breast pump type: Double-Electric Breast Pump Pump Education: Milk Storage  Interventions Interventions: Breast feeding basics reviewed;Assisted with latch;Skin to skin;Breast massage;Hand express;Reverse pressure;Breast compression;Adjust position;Support  pillows;Position options;Hand pump;DEBP;Education;LC Services brochure;LPT handout/interventions  Discharge Discharge Education: Engorgement and breast care;Warning signs for feeding baby Pump: Manual;Hands Free;Personal  Consult Status Consult Status: Follow-up Date: 08/14/22 Follow-up type: In-patient    Kelly Lane 08/14/2022, 1:15 PM

## 2022-08-14 NOTE — Lactation Note (Signed)
This note was copied from a baby's chart. Lactation Consultation Note  Patient Name: Kelly Lane [id number] Date: 08/14/2022 Age:28 hours Reason for consult: Follow-up assessment;Early term 37-38.6wks;Infant < 6lbs;Infant weight loss (28 % weight loss, Crista Curb NP asked to see the dyad and assess feedings. Baby had last fed at 10 am, sound asleep. LC asked mom to call on the nurses light with feeding cues by 1pm .) Parents desire to be D/C today.  Per mom hasn't been pumping and just working on the latching and supplementing after feedings at the breast.  LC reviewed BF D/C teaching, importance of following the LC feeding plan , feed with feeding cues and by 3 hours prepare to feed.  If the baby is to sluggish to latch, feed baby and appetizer from the bottle of EBM or formula then try to latch.  Feed one breast every feeding and supplement increasing volume to 30 ml of EBM or formula if not available.  LC stressed the importance of the volume increasing for supplementation to protect the baby;s energy level , stretch the baby's belly and easy calories the baby doesn't have to work hard for to increase the weight steadily.  After the supplementation , post pump both breast for 15 mins and save milk for the next feeding.  Mom aware to call and LC provided report to the [facility] Marissa Mabe   Maternal Data Has patient been taught Hand Expression?: Yes  Feeding Mother's Current Feeding Choice: Breast Milk and Donor Milk Nipple Type: Extra Slow Flow  LATCH Score range - 8-6-9      Lactation Tools Discussed/Used Tools: Pump;Flanges Flange Size: 24 Breast pump type: Double-Electric Breast Pump Pump Education: Milk Storage  Interventions    Discharge Discharge Education: Engorgement and breast care;Warning signs for feeding baby;Outpatient recommendation;Other (comment) (baby's F.U is at the [facility], [facility] recommended for mom to check for F/U with LC , if unavailable to call  [facility] . mom has the number.) Pump: Personal;Hands Free  Consult Status Consult Status: Follow-up Date: 08/14/22 Follow-up type: In-patient    [name] Sumi Lye 08/14/2022, 11:32 AM

## 2022-08-18 ENCOUNTER — Encounter: Payer: Self-pay | Admitting: Obstetrics and Gynecology

## 2022-08-18 ENCOUNTER — Encounter: Payer: BC Managed Care – PPO | Admitting: Advanced Practice Midwife

## 2022-08-22 ENCOUNTER — Telehealth: Payer: Self-pay

## 2022-08-22 ENCOUNTER — Ambulatory Visit (INDEPENDENT_AMBULATORY_CARE_PROVIDER_SITE_OTHER): Payer: BC Managed Care – PPO

## 2022-08-22 VITALS — BP 132/92 | HR 100

## 2022-08-22 DIAGNOSIS — R030 Elevated blood-pressure reading, without diagnosis of hypertension: Secondary | ICD-10-CM

## 2022-08-22 NOTE — Progress Notes (Signed)
Subjective:  Kelly Lane is a 27 y.o. female for BP check virtually.   Hypertension ROS: not taking medications regularly as instructed, no TIA's, no chest pain on exertion, no dyspnea on exertion, and no swelling of ankles.    Objective:  BP (!) 132/92   Pulse 100   Breastfeeding Yes   General exam BP noted to need improvement.   Assessment:   Blood Pressure needs further observation, needs improvement, and patient poorly compliant.   Plan:  The following changes are to be made: restart NIFEdipine  and return to the office in 1 week for an in person blood pressure check .

## 2022-08-22 NOTE — Telephone Encounter (Signed)
2nd attempt to contact pt for 10:40 appt

## 2022-08-25 ENCOUNTER — Encounter: Payer: BC Managed Care – PPO | Admitting: Obstetrics and Gynecology

## 2022-08-29 ENCOUNTER — Ambulatory Visit: Payer: BC Managed Care – PPO

## 2022-09-01 ENCOUNTER — Encounter: Payer: BC Managed Care – PPO | Admitting: Obstetrics and Gynecology

## 2022-09-24 ENCOUNTER — Encounter: Payer: Self-pay | Admitting: Obstetrics

## 2022-09-24 ENCOUNTER — Ambulatory Visit (INDEPENDENT_AMBULATORY_CARE_PROVIDER_SITE_OTHER): Payer: BC Managed Care – PPO | Admitting: Obstetrics

## 2022-09-24 DIAGNOSIS — K5901 Slow transit constipation: Secondary | ICD-10-CM

## 2022-09-24 DIAGNOSIS — M549 Dorsalgia, unspecified: Secondary | ICD-10-CM | POA: Diagnosis not present

## 2022-09-24 MED ORDER — DOCUSATE SODIUM 100 MG PO CAPS
100.0000 mg | ORAL_CAPSULE | Freq: Two times a day (BID) | ORAL | 11 refills | Status: AC
Start: 2022-09-24 — End: ?

## 2022-09-24 MED ORDER — POLYETHYLENE GLYCOL 3350 17 G PO PACK
17.0000 g | PACK | Freq: Every day | ORAL | 11 refills | Status: AC
Start: 2022-09-24 — End: ?

## 2022-09-24 NOTE — Progress Notes (Signed)
Post Partum Visit Note  Kelly Lane is a 27 y.o. [phone] female who presents for a postpartum visit. She is 7 weeks postpartum following a normal spontaneous vaginal delivery.  I have fully reviewed the prenatal and intrapartum course. The delivery was at 37 gestational weeks.  Anesthesia: epidural. Postpartum course has been uncomplicated. Baby is doing well. Baby is feeding by breast. Bleeding no bleeding. Bowel function is abnormal: for about 3 days and painful . Bladder function is normal. Patient is not sexually active. Contraception method is none. Postpartum depression screening: negative.   The pregnancy intention screening data noted above was reviewed. Potential methods of contraception were discussed. The patient elected to proceed with No data recorded.   Edinburgh Postnatal Depression Scale - 09/24/22 1456       Edinburgh Postnatal Depression Scale:  In the Past 7 Days   I have been able to laugh and see the funny side of things. 0    I have looked forward with enjoyment to things. 0    I have blamed myself unnecessarily when things went wrong. 0    I have been anxious or worried for no good reason. 2    I have felt scared or panicky for no good reason. 0    Things have been getting on top of me. 2    I have been so unhappy that I have had difficulty sleeping. 0    I have felt sad or miserable. 0    I have been so unhappy that I have been crying. 0    The thought of harming myself has occurred to me. 0    Edinburgh Postnatal Depression Scale Total 4             Health Maintenance Due  Topic Date Due   COVID-19 Vaccine (1) Never done    The following portions of the patient's history were reviewed and updated as appropriate: allergies, current medications, past family history, past medical history, past social history, past surgical history, and problem list.  Review of Systems A comprehensive review of systems was negative except for: Gastrointestinal:  positive for constipation Musculoskeletal: positive for back pain  Objective:  BP 120/86   Pulse 89   Ht 5\' 2"  (1.575 m)   Wt 142 lb (64.4 kg)   Breastfeeding Yes   BMI 25.97 kg/m    General:  alert and no distress   Breasts:  normal  Lungs: clear to auscultation bilaterally  Heart:  regular rate and rhythm, S1, S2 normal, no murmur, click, rub or gallop  Abdomen: soft, non-tender; bowel sounds normal; no masses,  no organomegaly   Wound none  GU exam:  normal       Assessment:    1. Postpartum care following vaginal delivery  2. Constipation by delayed colonic transit Rx: - docusate sodium (COLACE) 100 MG capsule; Take 1 capsule (100 mg total) by mouth 2 (two) times daily.  Dispense: 60 capsule; Refill: 11 - polyethylene glycol (MIRALAX) 17 g packet; Take 17 g by mouth daily.  Dispense: 60 each; Refill: 11  3. Backache without radiation Rx: - Ambulatory referral to Physical Therapy   Plan:   Essential components of care per ACOG recommendations:  1.  Mood and well being: Patient with negative depression screening today. Reviewed local resources for support.  - Patient tobacco use? No.   - hx of drug use? No.    2. Infant care and feeding:  -Patient currently breastmilk feeding?  Yes. Discussed returning to work and pumping. Reviewed importance of draining breast regularly to support lactation.  -Social determinants of health (SDOH) reviewed in EPIC. No concerns  3. Sexuality, contraception and birth spacing - Patient does not want a pregnancy in the next year.  Desired family size is 2 children.  - Reviewed forms of contraception in tiered fashion. Patient desired abstinence today.   - Discussed birth spacing of 18 months  4. Sleep and fatigue -Encouraged family/partner/community support of 4 hrs of uninterrupted sleep to help with mood and fatigue  5. Physical Recovery  - Discussed patients delivery and complications. She describes her labor as good. - Patient  had a Vaginal, no problems at delivery. Patient had a 2nd degree laceration. Perineal healing reviewed. Patient expressed understanding - Patient has urinary incontinence? No. - Patient is safe to resume physical and sexual activity  6.  Health Maintenance - HM due items addressed Yes - Last pap smear  Diagnosis  Date Value Ref Range Status  06/05/2021      - Negative for Intraepithelial Lesions or Malignancy (NILM)  06/05/2021 - Benign reactive/reparative changes     Pap smear not done at today's visit.  -Breast Cancer screening indicated? No.   7. Chronic Disease/Pregnancy Condition follow up: None   Coral Ceo, MD [facility], [place] 09/24/22

## 2022-09-29 NOTE — Therapy (Signed)
OUTPATIENT PHYSICAL THERAPY THORACOLUMBAR EVALUATION   Patient Name: Kelly Lane MRN: [id number] DOB:[birth year], 27 y.o., female Today's Date: 09/30/2022  END OF SESSION:   Past Medical History:  Diagnosis Date   Anxiety    Endometriosis 07/2015   GERD (gastroesophageal reflux disease)    NO MEDS   Ovarian cyst 07/2015   Past Surgical History:  Procedure Laterality Date   CESAREAN SECTION N/A 09/20/2020   Procedure: CESAREAN SECTION;  Surgeon: Tereso Newcomer, MD;  Location: [facility]S;  Service: Obstetrics;  Laterality: N/A;   LAPAROSCOPIC OVARIAN CYSTECTOMY Right 07/17/2015   Procedure: EXCISION OF ENDOMETRIOSIS;  Surgeon: Nadara Mustard, MD;  Location: [facility] ORS;  Service: Gynecology;  Laterality: Right;   LAPAROSCOPY N/A 07/17/2015   Procedure: LAPAROSCOPY OPERATIVE;  Surgeon: Nadara Mustard, MD;  Location: [facility] ORS;  Service: Gynecology;  Laterality: N/A;   Patient Active Problem List   Diagnosis Date Noted   VBAC (vaginal birth after Cesarean) 08/12/2022   Chronic hypertension affecting pregnancy 08/10/2022   H/O severe pre-eclampsia 04/17/2022   H/O emergency cesarean section 09/20/2020   Rh negative state in antepartum period 06/07/2020      REFERRING PROVIDER: Harper,Charles MD  REFERRING DIAG: back pain following epidural  Rationale for Evaluation and Treatment: Rehabilitation  THERAPY DIAG:  Back pain;weakness  ONSET DATE: ***  SUBJECTIVE:                                                                                                                                                                                           SUBJECTIVE STATEMENT: ***  PERTINENT HISTORY:  ***  PAIN:  PAIN:  Are you having pain? Yes NPRS scale: ***/10 Pain location: ***  Aggravating factors: *** Relieving factors: ***  PRECAUTIONS: None  WEIGHT BEARING RESTRICTIONS: No  FALLS:  Has patient fallen in last 6 months? No  LIVING ENVIRONMENT: Lives with: {[facility]  lives with:25569::"lives with their family"}  OCCUPATION: ***  PLOF: {PLOF:24004}  PATIENT GOALS: ***  NEXT MD VISIT: ***  OBJECTIVE:   DIAGNOSTIC FINDINGS:  ***  PATIENT SURVEYS:  Modified Oswestry ***    COGNITION: Overall cognitive status: Within functional limits for tasks assessed     SENSATION: WFL  MUSCLE LENGTH: Hamstrings: Right *** deg; Left *** deg Thomas test: Right *** deg; Left *** deg  POSTURE: {posture:25561}  PALPATION: ***  LUMBAR ROM:   AROM eval  Flexion   Extension   Right lateral flexion   Left lateral flexion   Right rotation   Left rotation    (Blank rows = not tested)  LOWER EXTREMITY ROM:     LOWER  EXTREMITY MMT:    MMT Right eval Left eval  Hip flexion    Hip extension    Hip abduction    Hip adduction    Hip internal rotation    Hip external rotation    Knee flexion    Knee extension    Ankle dorsiflexion    Ankle plantarflexion    Ankle inversion    Ankle eversion     (Blank rows = not tested)  LUMBAR SPECIAL TESTS:  {lumbar special test:25242}  FUNCTIONAL TESTS:  {Functional tests:24029}  GAIT: Comments: ***  TODAY'S TREATMENT:                                                                                                                              DATE: 09/30/22    PATIENT EDUCATION:  Education details: Educated patient on anatomy and physiology of current symptoms, prognosis, plan of care as well as initial self care strategies to promote recovery  Person educated: Patient Education method: Explanation Education comprehension: verbalized understanding  HOME EXERCISE PROGRAM: ***  ASSESSMENT:  CLINICAL IMPRESSION: Patient is a 27 y.o. female who was seen today for physical therapy evaluation and treatment for back pain following childbirth/epidural.   OBJECTIVE IMPAIRMENTS: {opptimpairments:25111}.   ACTIVITY LIMITATIONS: {activitylimitations:27494}  PARTICIPATION LIMITATIONS:  {participationrestrictions:25113}  PERSONAL FACTORS: {Personal factors:25162} are also affecting patient's functional outcome.   REHAB POTENTIAL: Good  CLINICAL DECISION MAKING: Stable/uncomplicated  EVALUATION COMPLEXITY: Low   GOALS: Goals reviewed with patient? Yes  SHORT TERM GOALS: Target date: ***  The patient will demonstrate knowledge of basic self care strategies and exercises to promote healing  Baseline: Goal status: INITIAL  2.  *** Baseline:  Goal status: INITIAL  3.  *** Baseline:  Goal status: INITIAL  4.  *** Baseline:  Goal status: INITIAL    LONG TERM GOALS: Target date: ***  The patient will be independent in a safe self progression of a home exercise program to promote further recovery of function  Baseline:  Goal status: INITIAL  2.  *** Baseline:  Goal status: INITIAL  3.  *** Baseline:  Goal status: INITIAL  4.  *** Baseline:  Goal status: INITIAL  5.  *** Baseline:  Goal status: INITIAL    PLAN:  PT FREQUENCY: {rehab frequency:25116}  PT DURATION: 8 weeks  PLANNED INTERVENTIONS: Therapeutic exercises, Therapeutic activity, Neuromuscular re-education, Patient/Family education, Self Care, Joint mobilization, Dry Needling, Electrical stimulation, Spinal mobilization, Cryotherapy, Moist heat, Taping, Traction, Ultrasound, Manual therapy, and Re-evaluation.  PLAN FOR NEXT SESSION: ***

## 2022-09-30 ENCOUNTER — Encounter: Payer: Self-pay | Admitting: Physical Therapy

## 2022-09-30 ENCOUNTER — Other Ambulatory Visit: Payer: Self-pay

## 2022-09-30 ENCOUNTER — Ambulatory Visit: Payer: BC Managed Care – PPO | Attending: Obstetrics | Admitting: Physical Therapy

## 2022-09-30 DIAGNOSIS — M6281 Muscle weakness (generalized): Secondary | ICD-10-CM | POA: Diagnosis not present

## 2022-09-30 DIAGNOSIS — M549 Dorsalgia, unspecified: Secondary | ICD-10-CM | POA: Insufficient documentation

## 2022-09-30 DIAGNOSIS — M5459 Other low back pain: Secondary | ICD-10-CM | POA: Diagnosis not present

## 2022-10-08 ENCOUNTER — Ambulatory Visit: Payer: BC Managed Care – PPO | Attending: Obstetrics

## 2022-10-08 DIAGNOSIS — M6281 Muscle weakness (generalized): Secondary | ICD-10-CM | POA: Diagnosis not present

## 2022-10-08 DIAGNOSIS — M5459 Other low back pain: Secondary | ICD-10-CM | POA: Diagnosis not present

## 2022-10-08 DIAGNOSIS — R262 Difficulty in walking, not elsewhere classified: Secondary | ICD-10-CM | POA: Diagnosis not present

## 2022-10-08 DIAGNOSIS — R252 Cramp and spasm: Secondary | ICD-10-CM

## 2022-10-08 NOTE — Therapy (Signed)
OUTPATIENT PHYSICAL THERAPY THORACOLUMBAR TREATMENT NOTE   Patient Name: Kelly Lane MRN: [id number] DOB:Jan 25, 1996, 27 y.o., female Today's Date: 09/30/2022  END OF SESSION:  PT End of Session - 10/08/22 1104     Visit Number 2    Date for PT Re-Evaluation 11/25/22    Authorization Type medicaid [employer] (secondary)    PT Start Time 1104    PT Stop Time 1148    PT Time Calculation (min) 44 min    Activity Tolerance Patient tolerated treatment well    Behavior During Therapy [facility] for tasks assessed/performed             Past Medical History:  Diagnosis Date   Anxiety    Endometriosis 07/2015   GERD (gastroesophageal reflux disease)    NO MEDS   Ovarian cyst 07/2015   Past Surgical History:  Procedure Laterality Date   CESAREAN SECTION N/A 09/20/2020   Procedure: CESAREAN SECTION;  Surgeon: Tereso Newcomer, MD;  Location: [facility] LD [facility]S;  Service: Obstetrics;  Laterality: N/A;   LAPAROSCOPIC OVARIAN CYSTECTOMY Right 07/17/2015   Procedure: EXCISION OF ENDOMETRIOSIS;  Surgeon: Nadara Mustard, MD;  Location: [facility] ORS;  Service: Gynecology;  Laterality: Right;   LAPAROSCOPY N/A 07/17/2015   Procedure: LAPAROSCOPY OPERATIVE;  Surgeon: Nadara Mustard, MD;  Location: [facility] ORS;  Service: Gynecology;  Laterality: N/A;   Patient Active Problem List   Diagnosis Date Noted   VBAC (vaginal birth after Cesarean) 08/12/2022   Chronic hypertension affecting pregnancy 08/10/2022   H/O severe pre-eclampsia 04/17/2022   H/O emergency cesarean section 09/20/2020   Rh negative state in antepartum period 06/07/2020      REFERRING PROVIDER: Harper,Charles MD  REFERRING DIAG: back pain following epidural  Rationale for Evaluation and Treatment: Rehabilitation  THERAPY DIAG:  Back pain;weakness  ONSET DATE: April 2024 exacerbation of chronic issue  SUBJECTIVE:                                                                                                                                                                                            SUBJECTIVE STATEMENT: Patient reports the legs are still fairly numb.  Mid to low back pain still present.  Maybe slightly better with the first set of exercises.  (Has 58 week old with her)     PERTINENT HISTORY:  Has seen a chiro in past helped but short term only Has 86 year old son  PAIN:  PAIN:  10/08/22: Are you having pain? Yes NPRS scale: 6/10 Pain location: upper lumbar, neck  Aggravating factors: turning/twisting; lifting baby in/out of the crib; bending over Relieving factors:  crossed legs stretch;  hot shower  PRECAUTIONS: None  WEIGHT BEARING RESTRICTIONS: No  FALLS:  Has patient fallen in last 6 months? 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   OBJECTIVE:    PATIENT SURVEYS:  Modified Oswestry 28%    COGNITION: Overall cognitive status: Within functional limits for tasks assessed     SENSATION: WFL   POSTURE: rounded shoulders and forward head   LUMBAR ROM:   AROM eval  Flexion 25 % limitation   Extension 25% limitation   Right lateral flexion 25 % limitation   Left lateral flexion 25 % limitation   Right rotation   Left rotation    (Blank rows = not tested)  TRUNK STRENGTH:  Decreased activation of transverse abdominus muscles; abdominals 4-/5; decreased activation of lumbar multifidi; trunk extensors 4-/5  LOWER EXTREMITY ROM:   WFLs  LOWER EXTREMITY MMT:    MMT Right eval Left eval  Hip flexion 5 5  Hip extension 4- 4-  Hip abduction 4- 4-  Hip adduction    Hip internal rotation    Hip external rotation 4- 4-  Knee flexion    Knee extension 5 5  Ankle dorsiflexion    Ankle plantarflexion    Ankle inversion    Ankle eversion      (Blank rows = not tested)    FUNCTIONAL TESTS:  Able to SLS 10 sec with some compensatory trunk lean and rotation   GAIT: Comments: WFLs  TODAY'S TREATMENT:                                                                                                                              DATE: 10/08/22  Nustep x 5 min level 3 (PT present to discuss goals and progress) Standing hamstring stretch 2 x 30 sec each LE Standing quad/hip flexor stretch 2 x 30 sec each LE Seated piriformis stretch 2 x 30 sec each LE Supine PPT x 20 Supine PPT with 90/90 heel tap x 20 Supine PPT with dying bug x 20 Supine SLR with PPT x 20 Trunk rotation x 20 Seated bilateral shoulder ER with red band x 20 Seated horizontal shoulder abduction with red band x 20 Moist heat to thoracic and lumbar spine for stiffness x 10 min in hook lying.    DATE: 09/30/22  Encouraged good sitting posture when breast feeding   PATIENT EDUCATION:  Education details: Educated patient on anatomy and physiology of current symptoms, prognosis, plan of care as well as initial self care strategies to promote recovery  Person educated: Patient Education method: Explanation Education comprehension: verbalized understanding  HOME EXERCISE PROGRAM: Access Code: [email or web address] URL: [email or web address] Date: 10/08/2022 Prepared by: Mikey Kirschner  Exercises -  Sidelying Diaphragmatic Breathing  - 1 x daily - 7 x weekly - 1 sets - 3-5 reps - 30-45s holds - Supine Thoracic Mobilization Foam Roll Horizontal with Arm Stretch  - 1 x daily - 7 x weekly - 1 sets - 3-5 reps - 30-45s holds - Supine Single Knee to Chest Stretch  - 1 x daily - 7 x weekly - 1 sets - 3-5 reps - 30-45s holds - Cat Cow  - 1 x daily - 7 x weekly - 1 sets - 3-5 reps - 30-45s holds - Sitting Wall Angels  - 1 x daily - 7 x weekly - 1 sets - 3-5 reps - 30-45s holds - Standing Hamstring Stretch on Chair  - 1 x daily - 7 x weekly - 1 sets - 3 reps - 20 sec hold -  Standing Quad Stretch with Table and Chair Support  - 1 x daily - 7 x weekly - 1 sets - 3 reps - 20 sec hold - Supine Lower Trunk Rotation  - 1 x daily - 7 x weekly - 1 sets - 20 reps - Supine Posterior Pelvic Tilt  - 1 x daily - 7 x weekly - 1 sets - 20 reps - Supine 90/90 Alternating Heel Touches with Posterior Pelvic Tilt  - 1 x daily - 7 x weekly - 1 sets - 20 reps  ASSESSMENT:  CLINICAL IMPRESSION: [place] arrived today with her newborn.  She was able to tolerate addition of Nustep and several flexibility and core strength exercises with minimal increase in pain.  We also added thoracic/postural strengthening with tband to help with her rounded shoulders posture.  She is extremely large breasted with small frame causing excessive strain on her thoracic spine.  Reminded patient of need to practice good posture while breast feeding and when caring for her 28 year old and newborn.  Provided handouts for new exercises added to HEP.  OBJECTIVE IMPAIRMENTS: decreased activity tolerance, decreased ROM, decreased strength, impaired perceived functional ability, and pain.   ACTIVITY LIMITATIONS: lifting, bending, sitting, standing, locomotion level, and caring for others  PARTICIPATION LIMITATIONS: meal prep, cleaning, laundry, community activity, and occupation  PERSONAL FACTORS: Time since onset of injury/illness/exacerbation are also affecting patient's functional outcome.   REHAB POTENTIAL: Good  CLINICAL DECISION MAKING: Stable/uncomplicated  EVALUATION COMPLEXITY: Low   GOALS: Goals reviewed with patient? Yes  SHORT TERM GOALS: Target date: 10/28/2022    The patient will demonstrate knowledge of basic self care strategies and exercises to promote healing  Baseline: Goal status: INITIAL  2.  The patient will report a 35% improvement in pain levels with functional activities which are currently difficult including bending and lifting the baby in/out of the crib Baseline:  Goal  status: INITIAL  3. Lumbar ROM WFLS in all planes needed for taking care of her children INITIAL    LONG TERM GOALS: Target date: 11/25/2022    The patient will be independent in a safe self progression of a home exercise program to promote further recovery of function  Baseline:  Goal status: INITIAL  2.  The patient will have improved trunk flexor and extensor muscle strength to at least 4+/5 needed for lifting her infant and for return to work in a daycare Baseline:  Goal status: INITIAL  3.  The patient will have improved hip strength to at least 4+/5 needed for standing, walking longer distances and descending stairs at home and in the community  Baseline:  Goal status: INITIAL  4.  Modified Oswestry Index improved to 18% Baseline:  Goal status: INITIAL  5. The patient will report a 70% improvement in pain levels with functional activities which are currently difficult including lifting/bending  and twisting tasks  PLAN:  PT FREQUENCY: 1x/week  PT DURATION: 8 weeks  PLANNED INTERVENTIONS: Therapeutic exercises, Therapeutic activity, Neuromuscular re-education, Patient/Family education, Self Care, Joint mobilization, Dry Needling, Electrical stimulation, Spinal mobilization, Cryotherapy, Moist heat, Taping, Traction, Ultrasound, Manual therapy, and Re-evaluation.  PLAN FOR NEXT SESSION: Continue postural and core strengthening,  LE flexibility and pain control.    Victorino Dike B. Josephine Rudnick, PT 10/08/22 12:14 PM [facility] [address] [place], [place] [address] Phone # [phone] Fax [phone]

## 2022-10-15 ENCOUNTER — Ambulatory Visit: Payer: BC Managed Care – PPO

## 2022-10-15 DIAGNOSIS — M5459 Other low back pain: Secondary | ICD-10-CM

## 2022-10-15 DIAGNOSIS — M6281 Muscle weakness (generalized): Secondary | ICD-10-CM | POA: Diagnosis not present

## 2022-10-15 DIAGNOSIS — R252 Cramp and spasm: Secondary | ICD-10-CM

## 2022-10-15 DIAGNOSIS — R262 Difficulty in walking, not elsewhere classified: Secondary | ICD-10-CM | POA: Diagnosis not present

## 2022-10-15 NOTE — Therapy (Signed)
OUTPATIENT PHYSICAL THERAPY THORACOLUMBAR TREATMENT NOTE   Patient Name: Kelly Lane MRN: [id number] DOB:09/25/1995, 27 y.o., female Today's Date: 09/30/2022  END OF SESSION:  PT End of Session - 10/15/22 1033     Visit Number 3    Date for PT Re-Evaluation 11/25/22    Authorization Type medicaid [employer] (secondary)    PT Start Time 1015    PT Stop Time 1100    PT Time Calculation (min) 45 min    Activity Tolerance Patient tolerated treatment well    Behavior During Therapy [facility] for tasks assessed/performed             Past Medical History:  Diagnosis Date   Anxiety    Endometriosis 07/2015   GERD (gastroesophageal reflux disease)    NO MEDS   Ovarian cyst 07/2015   Past Surgical History:  Procedure Laterality Date   CESAREAN SECTION N/A 09/20/2020   Procedure: CESAREAN SECTION;  Surgeon: Tereso Newcomer, MD;  Location: [facility] LD ORS;  Service: Obstetrics;  Laterality: N/A;   LAPAROSCOPIC OVARIAN CYSTECTOMY Right 07/17/2015   Procedure: EXCISION OF ENDOMETRIOSIS;  Surgeon: Nadara Mustard, MD;  Location: [facility] ORS;  Service: Gynecology;  Laterality: Right;   LAPAROSCOPY N/A 07/17/2015   Procedure: LAPAROSCOPY OPERATIVE;  Surgeon: Nadara Mustard, MD;  Location: [facility] ORS;  Service: Gynecology;  Laterality: N/A;   Patient Active Problem List   Diagnosis Date Noted   VBAC (vaginal birth after Cesarean) 08/12/2022   Chronic hypertension affecting pregnancy 08/10/2022   H/O severe pre-eclampsia 04/17/2022   H/O emergency cesarean section 09/20/2020   Rh negative state in antepartum period 06/07/2020      REFERRING PROVIDER: Harper,Charles MD  REFERRING DIAG: back pain following epidural  Rationale for Evaluation and Treatment: Rehabilitation  THERAPY DIAG:  Back pain;weakness  ONSET DATE: April 2024 exacerbation of chronic issue  SUBJECTIVE:                                                                                                                                                                                            SUBJECTIVE STATEMENT: Patient reports I'm a little better.  "My wrist is hurting"   PERTINENT HISTORY:  Has seen a chiro in past helped but short term only Has 23 year old son  PAIN:  PAIN:  10/15/22: Are you having pain? Yes NPRS scale: 4/10 Pain location: upper lumbar, neck  Aggravating factors: turning/twisting; lifting baby in/out of the crib; bending over Relieving factors: crossed legs stretch;  hot shower  PRECAUTIONS: None  WEIGHT BEARING RESTRICTIONS: No  FALLS:  Has patient fallen in last 6 months?  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   OBJECTIVE:    PATIENT SURVEYS:  Modified Oswestry 28%    COGNITION: Overall cognitive status: Within functional limits for tasks assessed     SENSATION: WFL   POSTURE: rounded shoulders and forward head   LUMBAR ROM:   AROM eval  Flexion 25 % limitation   Extension 25% limitation   Right lateral flexion 25 % limitation   Left lateral flexion 25 % limitation   Right rotation   Left rotation    (Blank rows = not tested)  TRUNK STRENGTH:  Decreased activation of transverse abdominus muscles; abdominals 4-/5; decreased activation of lumbar multifidi; trunk extensors 4-/5  LOWER EXTREMITY ROM:   WFLs  LOWER EXTREMITY MMT:    MMT Right eval Left eval  Hip flexion 5 5  Hip extension 4- 4-  Hip abduction 4- 4-  Hip adduction    Hip internal rotation    Hip external rotation 4- 4-  Knee flexion    Knee extension 5 5  Ankle dorsiflexion    Ankle plantarflexion    Ankle inversion    Ankle eversion     (Blank rows = not tested)    FUNCTIONAL TESTS:  Able to SLS 10 sec with some compensatory trunk lean  and rotation   GAIT: Comments: WFLs  TODAY'S TREATMENT:                                                                                                                              DATE: 10/15/22  Nustep x 5 min level 3 (PT present to discuss goals and progress) Standing hamstring stretch 2 x 30 sec each LE Standing quad/hip flexor stretch 2 x 30 sec each LE Seated piriformis stretch 2 x 30 sec each LE Supine bridge with feet on box (16" side) with breathing in and engaging pelvic floor x 10 Supine PPT with feet on 16" box with breathing in and engaging pelvic floor x 10 Supine PPT with alternating leg lifts with feet on 16" box with breathing in and engaging pelvic floor x 20 Supine PPT with bridge and clam using yellow loop with feet on 16" box with breathing in and engaging pelvic floor x 20 Moist heat to thoracic and lumbar spine for stiffness x 10 min in hook lying.    DATE: 10/08/22  Nustep x 5 min level 3 (PT present to discuss goals and progress) Standing hamstring stretch 2 x 30 sec each LE Standing quad/hip flexor stretch 2 x 30 sec each LE Seated piriformis stretch 2 x 30 sec each LE Supine PPT x 20 Supine PPT with 90/90 heel tap x 20 Supine PPT with dying bug x 20  Supine SLR with PPT x 20 Trunk rotation x 20 Seated bilateral shoulder ER with red band x 20 Seated horizontal shoulder abduction with red band x 20 Moist heat to thoracic and lumbar spine for stiffness x 10 min in hook lying.    DATE: 09/30/22  Encouraged good sitting posture when breast feeding   PATIENT EDUCATION:  Education details: Educated patient on anatomy and physiology of current symptoms, prognosis, plan of care as well as initial self care strategies to promote recovery  Person educated: Patient Education method: Explanation Education comprehension: verbalized understanding  HOME EXERCISE PROGRAM: Access Code: [email or web address] URL: [email or web address] Date: 10/08/2022 Prepared by:  Mikey Kirschner  Exercises - Sidelying Diaphragmatic Breathing  - 1 x daily - 7 x weekly - 1 sets - 3-5 reps - 30-45s holds - Supine Thoracic Mobilization Foam Roll Horizontal with Arm Stretch  - 1 x daily - 7 x weekly - 1 sets - 3-5 reps - 30-45s holds - Supine Single Knee to Chest Stretch  - 1 x daily - 7 x weekly - 1 sets - 3-5 reps - 30-45s holds - Cat Cow  - 1 x daily - 7 x weekly - 1 sets - 3-5 reps - 30-45s holds - Sitting Wall Angels  - 1 x daily - 7 x weekly - 1 sets - 3-5 reps - 30-45s holds - Standing Hamstring Stretch on Chair  - 1 x daily - 7 x weekly - 1 sets - 3 reps - 20 sec hold - Standing Quad Stretch with Table and Chair Support  - 1 x daily - 7 x weekly - 1 sets - 3 reps - 20 sec hold - Supine Lower Trunk Rotation  - 1 x daily - 7 x weekly - 1 sets - 20 reps - Supine Posterior Pelvic Tilt  - 1 x daily - 7 x weekly - 1 sets - 20 reps - Supine 90/90 Alternating Heel Touches with Posterior Pelvic Tilt  - 1 x daily - 7 x weekly - 1 sets - 20 reps  ASSESSMENT:  CLINICAL IMPRESSION: Haddie responded well to last session and was able to tolerated addition of pelvic floor focused core stabilization.  She had very little pain with all activities today.  She would benefit from continued skilled PT for core stabilization and pelvic floor strengthening.   OBJECTIVE IMPAIRMENTS: decreased activity tolerance, decreased ROM, decreased strength, impaired perceived functional ability, and pain.   ACTIVITY LIMITATIONS: lifting, bending, sitting, standing, locomotion level, and caring for others  PARTICIPATION LIMITATIONS: meal prep, cleaning, laundry, community activity, and occupation  PERSONAL FACTORS: Time since onset of injury/illness/exacerbation are also affecting patient's functional outcome.   REHAB POTENTIAL: Good  CLINICAL DECISION MAKING: Stable/uncomplicated  EVALUATION COMPLEXITY: Low   GOALS: Goals reviewed with patient? Yes  SHORT TERM GOALS: Target date:  10/28/2022    The patient will demonstrate knowledge of basic self care strategies and exercises to promote healing  Baseline: Goal status: MET  2.  The patient will report a 35% improvement in pain levels with functional activities which are currently difficult including bending and lifting the baby in/out of the crib Baseline:  Goal status: INITIAL  3. Lumbar ROM WFLS in all planes needed for taking care of her children INITIAL    LONG TERM GOALS: Target date: 11/25/2022    The patient will be independent in a safe self progression of a home exercise program to promote further recovery of function  Baseline:  Goal status: INITIAL  2.  The patient will have improved trunk flexor and extensor muscle strength to at least 4+/5 needed for lifting her infant and for return to work in a daycare Baseline:  Goal status: INITIAL  3.  The patient will have improved hip strength to at least 4+/5 needed for standing, walking longer distances and descending stairs at home and in the community  Baseline:  Goal status: INITIAL  4.  Modified Oswestry Index improved to 18% Baseline:  Goal status: INITIAL  5. The patient will report a 70% improvement in pain levels with functional activities which are currently difficult including lifting/bending  and twisting tasks  PLAN:  PT FREQUENCY: 1x/week  PT DURATION: 8 weeks  PLANNED INTERVENTIONS: Therapeutic exercises, Therapeutic activity, Neuromuscular re-education, Patient/Family education, Self Care, Joint mobilization, Dry Needling, Electrical stimulation, Spinal mobilization, Cryotherapy, Moist heat, Taping, Traction, Ultrasound, Manual therapy, and Re-evaluation.  PLAN FOR NEXT SESSION: Pelvic floor focused strengthening. Continue postural and core strengthening,  LE flexibility and pain control.    Victorino Dike B. Klaryssa Fauth, PT 10/15/22 10:00 PM  [facility] [address], [address] [place], [place]  [address] Phone # [phone] Fax [phone]

## 2022-10-21 ENCOUNTER — Ambulatory Visit: Payer: BC Managed Care – PPO

## 2022-10-21 DIAGNOSIS — R262 Difficulty in walking, not elsewhere classified: Secondary | ICD-10-CM

## 2022-10-21 DIAGNOSIS — M6281 Muscle weakness (generalized): Secondary | ICD-10-CM | POA: Diagnosis not present

## 2022-10-21 DIAGNOSIS — R252 Cramp and spasm: Secondary | ICD-10-CM

## 2022-10-21 DIAGNOSIS — M5459 Other low back pain: Secondary | ICD-10-CM | POA: Diagnosis not present

## 2022-10-21 NOTE — Therapy (Signed)
OUTPATIENT PHYSICAL THERAPY THORACOLUMBAR TREATMENT NOTE   Patient Name: Kelly Lane MRN: [id number] DOB:1996/05/04, 27 y.o., female Today's Date: 09/30/2022  END OF SESSION:  PT End of Session - 10/21/22 0959     Visit Number 4    Date for PT Re-Evaluation 11/25/22    Authorization Type medicaid [employer] (secondary)    PT Start Time 0930    PT Stop Time 1015    PT Time Calculation (min) 45 min    Activity Tolerance Patient tolerated treatment well    Behavior During Therapy [facility] for tasks assessed/performed             Past Medical History:  Diagnosis Date   Anxiety    Endometriosis 07/2015   GERD (gastroesophageal reflux disease)    NO MEDS   Ovarian cyst 07/2015   Past Surgical History:  Procedure Laterality Date   CESAREAN SECTION N/A 09/20/2020   Procedure: CESAREAN SECTION;  Surgeon: Tereso Newcomer, MD;  Location: [facility] LD ORS;  Service: Obstetrics;  Laterality: N/A;   LAPAROSCOPIC OVARIAN CYSTECTOMY Right 07/17/2015   Procedure: EXCISION OF ENDOMETRIOSIS;  Surgeon: Nadara Mustard, MD;  Location: [facility] ORS;  Service: Gynecology;  Laterality: Right;   LAPAROSCOPY N/A 07/17/2015   Procedure: LAPAROSCOPY OPERATIVE;  Surgeon: Nadara Mustard, MD;  Location: [facility] ORS;  Service: Gynecology;  Laterality: N/A;   Patient Active Problem List   Diagnosis Date Noted   VBAC (vaginal birth after Cesarean) 08/12/2022   Chronic hypertension affecting pregnancy 08/10/2022   H/O severe pre-eclampsia 04/17/2022   H/O emergency cesarean section 09/20/2020   Rh negative state in antepartum period 06/07/2020      REFERRING PROVIDER: Harper,Charles MD  REFERRING DIAG: back pain following epidural  Rationale for Evaluation and Treatment: Rehabilitation  THERAPY DIAG:  Back pain;weakness  ONSET DATE: April 2024 exacerbation of chronic issue  SUBJECTIVE:                                                                                                                                                                                            SUBJECTIVE STATEMENT: Patient reports "my wrist stopped hurting but my back still hurts". "Is there anything that can just pull me apart?"  We discuss traction as an option and patient agrees to try this.  Patient also inquires about aquatic therapy for a few sessions to see if this is something that she could do when she is discharged from formal PT.    PERTINENT HISTORY:  Has seen a chiro in past helped but short term only Has 83 year old son  PAIN:  PAIN:  10/21/22:  Are you having pain? Yes NPRS scale: 4-5/10 Pain location: upper lumbar Aggravating factors: turning/twisting; lifting baby in/out of the crib; bending over Relieving factors: crossed legs stretch;  hot shower  PRECAUTIONS: None  WEIGHT BEARING RESTRICTIONS: No  FALLS:  Has patient fallen in last 6 months? 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   OBJECTIVE:    PATIENT SURVEYS:  Modified Oswestry 28%    COGNITION: Overall cognitive status: Within functional limits for tasks assessed     SENSATION: WFL   POSTURE: rounded shoulders and forward head   LUMBAR ROM:   AROM eval  Flexion 25 % limitation   Extension 25% limitation   Right lateral flexion 25 % limitation   Left lateral flexion 25 % limitation   Right rotation   Left rotation    (Blank rows = not tested)  TRUNK STRENGTH:  Decreased activation of transverse abdominus muscles; abdominals 4-/5; decreased activation of lumbar multifidi; trunk extensors 4-/5  LOWER EXTREMITY ROM:   WFLs  LOWER EXTREMITY MMT:    MMT Right eval Left eval  Hip flexion 5 5  Hip extension 4- 4-  Hip abduction 4- 4-  Hip adduction    Hip internal rotation     Hip external rotation 4- 4-  Knee flexion    Knee extension 5 5  Ankle dorsiflexion    Ankle plantarflexion    Ankle inversion    Ankle eversion     (Blank rows = not tested)    FUNCTIONAL TESTS:  Able to SLS 10 sec with some compensatory trunk lean and rotation   GAIT: Comments: WFLs  TODAY'S TREATMENT:                                                                                                                              DATE: 10/21/22  Nustep x 10 min level 3 (PT present to discuss goals and progress) Reviewed various braces and supports including proper fitting bra that would be an appropriate option but also discussed breast reduction.  (Patient states she has already discussed this with her doctor) Mechanical lumbar traction x 15 min (50 lb max/ 30 lb min, 6 step, hold time 60 sec, rest 10 sec, speed 50%)  DATE: 10/15/22  Nustep x 5 min level 3 (PT present to discuss goals and progress) Standing hamstring stretch 2 x 30 sec each LE Standing quad/hip flexor stretch 2 x 30 sec each LE Seated piriformis stretch 2 x 30 sec each LE Supine bridge with feet on box (16" side) with breathing in and engaging pelvic floor x 10 Supine PPT with feet on 16" box with breathing in and engaging pelvic floor x 10 Supine PPT with alternating  leg lifts with feet on 16" box with breathing in and engaging pelvic floor x 20 Supine PPT with bridge and clam using yellow loop with feet on 16" box with breathing in and engaging pelvic floor x 20 Moist heat to thoracic and lumbar spine for stiffness x 10 min in hook lying.    DATE: 10/08/22  Nustep x 5 min level 3 (PT present to discuss goals and progress) Standing hamstring stretch 2 x 30 sec each LE Standing quad/hip flexor stretch 2 x 30 sec each LE Seated piriformis stretch 2 x 30 sec each LE Supine PPT x 20 Supine PPT with 90/90 heel tap x 20 Supine PPT with dying bug x 20 Supine SLR with PPT x 20 Trunk rotation x 20 Seated bilateral  shoulder ER with red band x 20 Seated horizontal shoulder abduction with red band x 20 Moist heat to thoracic and lumbar spine for stiffness x 10 min in hook lying.    DATE: 09/30/22  Encouraged good sitting posture when breast feeding   PATIENT EDUCATION:  Education details: Educated patient on anatomy and physiology of current symptoms, prognosis, plan of care as well as initial self care strategies to promote recovery  Person educated: Patient Education method: Explanation Education comprehension: verbalized understanding  HOME EXERCISE PROGRAM: Access Code: [email or web address] URL: [email or web address] Date: 10/08/2022 Prepared by: Mikey Kirschner  Exercises - Sidelying Diaphragmatic Breathing  - 1 x daily - 7 x weekly - 1 sets - 3-5 reps - 30-45s holds - Supine Thoracic Mobilization Foam Roll Horizontal with Arm Stretch  - 1 x daily - 7 x weekly - 1 sets - 3-5 reps - 30-45s holds - Supine Single Knee to Chest Stretch  - 1 x daily - 7 x weekly - 1 sets - 3-5 reps - 30-45s holds - Cat Cow  - 1 x daily - 7 x weekly - 1 sets - 3-5 reps - 30-45s holds - Sitting Wall Angels  - 1 x daily - 7 x weekly - 1 sets - 3-5 reps - 30-45s holds - Standing Hamstring Stretch on Chair  - 1 x daily - 7 x weekly - 1 sets - 3 reps - 20 sec hold - Standing Quad Stretch with Table and Chair Support  - 1 x daily - 7 x weekly - 1 sets - 3 reps - 20 sec hold - Supine Lower Trunk Rotation  - 1 x daily - 7 x weekly - 1 sets - 20 reps - Supine Posterior Pelvic Tilt  - 1 x daily - 7 x weekly - 1 sets - 20 reps - Supine 90/90 Alternating Heel Touches with Posterior Pelvic Tilt  - 1 x daily - 7 x weekly - 1 sets - 20 reps  ASSESSMENT:  CLINICAL IMPRESSION: Kelly Lane responded well to traction but will need to continue to work on stabilization.  She has been compliant with attendance and does her HEP as often as she is able with caring for her newborn.   She inquired about aquatics therapy in the event she  responds well to this, she would like to continue this once she is discharged.  She would benefit from continued skilled PT for core stabilization and pelvic floor strengthening.   OBJECTIVE IMPAIRMENTS: decreased activity tolerance, decreased ROM, decreased strength, impaired perceived functional ability, and pain.   ACTIVITY LIMITATIONS: lifting, bending, sitting, standing, locomotion level, and caring for others  PARTICIPATION LIMITATIONS: meal prep, cleaning, laundry, community activity, and occupation  PERSONAL FACTORS: Time since  onset of injury/illness/exacerbation are also affecting patient's functional outcome.   REHAB POTENTIAL: Good  CLINICAL DECISION MAKING: Stable/uncomplicated  EVALUATION COMPLEXITY: Low   GOALS: Goals reviewed with patient? Yes  SHORT TERM GOALS: Target date: 10/28/2022    The patient will demonstrate knowledge of basic self care strategies and exercises to promote healing  Baseline: Goal status: MET  2.  The patient will report a 35% improvement in pain levels with functional activities which are currently difficult including bending and lifting the baby in/out of the crib Baseline:  Goal status: INITIAL  3. Lumbar ROM WFLS in all planes needed for taking care of her children MET 10/21/22    LONG TERM GOALS: Target date: 11/25/2022    The patient will be independent in a safe self progression of a home exercise program to promote further recovery of function  Baseline:  Goal status: INITIAL  2.  The patient will have improved trunk flexor and extensor muscle strength to at least 4+/5 needed for lifting her infant and for return to work in a daycare Baseline:  Goal status: INITIAL  3.  The patient will have improved hip strength to at least 4+/5 needed for standing, walking longer distances and descending stairs at home and in the community  Baseline:  Goal status: INITIAL  4.  Modified Oswestry Index improved to 18% Baseline:  Goal  status: INITIAL  5. The patient will report a 70% improvement in pain levels with functional activities which are currently difficult including lifting/bending  and twisting tasks  PLAN:  PT FREQUENCY: 1x/week  PT DURATION: 8 weeks  PLANNED INTERVENTIONS: Therapeutic exercises, Therapeutic activity, Neuromuscular re-education, Patient/Family education, Self Care, Joint mobilization, Dry Needling, Electrical stimulation, Spinal mobilization, Cryotherapy, Moist heat, Taping, Traction, Ultrasound, Manual therapy, and Re-evaluation.  PLAN FOR NEXT SESSION:  Continue postural and core strengthening,  LE flexibility and pain control.  Pelvic floor focused strengthening.  Victorino Dike B. Oma Alpert, PT 10/21/22 10:25 AM  [facility] [address] [place], [place] [address] Phone # [phone] Fax [phone]

## 2022-10-22 ENCOUNTER — Ambulatory Visit (INDEPENDENT_AMBULATORY_CARE_PROVIDER_SITE_OTHER): Payer: BC Managed Care – PPO | Admitting: Obstetrics

## 2022-10-22 ENCOUNTER — Encounter: Payer: Self-pay | Admitting: Obstetrics

## 2022-10-22 DIAGNOSIS — K5901 Slow transit constipation: Secondary | ICD-10-CM | POA: Diagnosis not present

## 2022-10-22 DIAGNOSIS — Z3009 Encounter for other general counseling and advice on contraception: Secondary | ICD-10-CM

## 2022-10-22 DIAGNOSIS — N6321 Unspecified lump in the left breast, upper outer quadrant: Secondary | ICD-10-CM | POA: Diagnosis not present

## 2022-10-22 MED ORDER — DOCUSATE SODIUM 100 MG PO CAPS
100.0000 mg | ORAL_CAPSULE | Freq: Two times a day (BID) | ORAL | 11 refills | Status: AC
Start: 2022-10-22 — End: ?

## 2022-10-22 NOTE — Progress Notes (Signed)
Subjective:     Kelly Lane is a 27 y.o. female who presents for a postpartum visit. She is 10 weeks postpartum following a spontaneous vaginal delivery. I have fully reviewed the prenatal and intrapartum course. The delivery was at 37 gestational weeks. Outcome: spontaneous vaginal delivery. Anesthesia: epidural. Postpartum course has been normal. Baby's course has been normal. Baby is feeding by breast. Bleeding no bleeding. Bowel function is  mild constipation, improved with colace . Bladder function is normal. Patient is not sexually active. Contraception method is abstinence. Postpartum depression screening: negative.  Tobacco, alcohol and substance abuse history reviewed.  Adult immunizations reviewed including TDAP, rubella and varicella.  The following portions of the patient's history were reviewed and updated as appropriate: allergies, current medications, past family history, past medical history, past social history, past surgical history, and problem list.  Review of Systems A comprehensive review of systems was negative.   Objective:    BP 117/83   Pulse 67   Ht 5\' 2"  (1.575 m)   Wt 135 lb (61.2 kg)   Breastfeeding Yes   BMI 24.69 kg/m   General:  alert and no distress   Breasts:  inspection negative, no nipple discharge or bleeding, no masses or nodularity palpable  Lungs: clear to auscultation bilaterally  Heart:  regular rate and rhythm, S1, S2 normal, no murmur, click, rub or gallop  Abdomen: soft, non-tender; bowel sounds normal; no masses,  no organomegaly   Vulva:  not evaluated  Vagina: not evaluated  Cervix:  Not evaluated  Corpus: not examined  Adnexa:  not evaluated  Rectal Exam: Not performed.          I have spent a total of 20 minutes of face-to-face time, excluding clinical staff time, reviewing notes and preparing to see patient, ordering tests and/or medications, and counseling the patient.   Assessment:    1. Postpartum care following vaginal  delivery - doing well  2. Constipation by delayed colonic transit Rx: - docusate sodium (COLACE) 100 MG capsule; Take 1 capsule (100 mg total) by mouth 2 (two) times daily.  Dispense: 60 capsule; Refill: 11  3. Mass of upper outer quadrant of left breast Rx: - [place] LIMITED ULTRASOUND INCLUDING AXILLA LEFT BREAST ; Future - MM 3D DIAGNOSTIC MAMMOGRAM UNILATERAL LEFT BREAST; Future  4. Encounter for other general counseling and advice on contraception - declines    Plan:    1. Contraception: abstinence 2. Continue PNV's 3. Follow up in: 3 months or as needed.    Brock Bad, MD 10/22/2022 11:42 AM

## 2022-10-22 NOTE — Progress Notes (Signed)
Pt presents for 2nd PP visit. Has some improvement with PT for backache, but back still feels tight. Continues to breastfeed. Constipation has improvement. Declines birth control.

## 2022-10-28 ENCOUNTER — Ambulatory Visit (HOSPITAL_BASED_OUTPATIENT_CLINIC_OR_DEPARTMENT_OTHER): Payer: BC Managed Care – PPO | Attending: Obstetrics | Admitting: Physical Therapy

## 2022-10-28 ENCOUNTER — Encounter (HOSPITAL_BASED_OUTPATIENT_CLINIC_OR_DEPARTMENT_OTHER): Payer: Self-pay | Admitting: Physical Therapy

## 2022-10-28 DIAGNOSIS — M6281 Muscle weakness (generalized): Secondary | ICD-10-CM

## 2022-10-28 DIAGNOSIS — M5459 Other low back pain: Secondary | ICD-10-CM

## 2022-10-28 DIAGNOSIS — R252 Cramp and spasm: Secondary | ICD-10-CM

## 2022-10-28 DIAGNOSIS — R262 Difficulty in walking, not elsewhere classified: Secondary | ICD-10-CM | POA: Diagnosis not present

## 2022-10-28 NOTE — Therapy (Signed)
OUTPATIENT PHYSICAL THERAPY THORACOLUMBAR TREATMENT NOTE   Patient Name: Kelly Lane MRN: [id number] DOB:10-10-1995, 27 y.o., female Today's Date: 09/30/2022  END OF SESSION:  PT End of Session - 10/28/22 1336     Visit Number 5    Date for PT Re-Evaluation 11/25/22    Authorization Type medicaid [employer] (secondary)    PT Start Time 1032    PT Stop Time 1115    PT Time Calculation (min) 43 min    Activity Tolerance Patient tolerated treatment well    Behavior During Therapy [facility] for tasks assessed/performed             Past Medical History:  Diagnosis Date   Anxiety    Endometriosis 07/2015   GERD (gastroesophageal reflux disease)    NO MEDS   Ovarian cyst 07/2015   Past Surgical History:  Procedure Laterality Date   CESAREAN SECTION N/A 09/20/2020   Procedure: CESAREAN SECTION;  Surgeon: Tereso Newcomer, MD;  Location: [facility] LD ORS;  Service: Obstetrics;  Laterality: N/A;   LAPAROSCOPIC OVARIAN CYSTECTOMY Right 07/17/2015   Procedure: EXCISION OF ENDOMETRIOSIS;  Surgeon: Nadara Mustard, MD;  Location: [facility] ORS;  Service: Gynecology;  Laterality: Right;   LAPAROSCOPY N/A 07/17/2015   Procedure: LAPAROSCOPY OPERATIVE;  Surgeon: Nadara Mustard, MD;  Location: [facility] ORS;  Service: Gynecology;  Laterality: N/A;   Patient Active Problem List   Diagnosis Date Noted   VBAC (vaginal birth after Cesarean) 08/12/2022   Chronic hypertension affecting pregnancy 08/10/2022   H/O severe pre-eclampsia 04/17/2022   H/O emergency cesarean section 09/20/2020   Rh negative state in antepartum period 06/07/2020      REFERRING PROVIDER: Harper,Charles MD  REFERRING DIAG: back pain following epidural  Rationale for Evaluation and Treatment: Rehabilitation  THERAPY DIAG:  Back pain;weakness  ONSET DATE: April 2024 exacerbation of chronic issue  SUBJECTIVE:                                                                                                                                                                                            SUBJECTIVE STATEMENT: Back 5/10 but feels mostly stiff.  Went for a walk on a trail other day and my knees started to hurt after about 15 mins.  The traction felt really good last session  Patient reports "my wrist stopped hurting but my back still hurts". "Is there anything that can just pull me apart?"  We discuss traction as an option and patient agrees to try this.  Patient also inquires about aquatic therapy for a few sessions to see if this is something that she could do  when she is discharged from formal PT.    PERTINENT HISTORY:  Has seen a chiro in past helped but short term only Has 50 year old son  PAIN:  PAIN:  10/21/22: Are you having pain? Yes NPRS scale: 4-5/10 Pain location: upper lumbar Aggravating factors: turning/twisting; lifting baby in/out of the crib; bending over Relieving factors: crossed legs stretch;  hot shower  PRECAUTIONS: None  WEIGHT BEARING RESTRICTIONS: No  FALLS:  Has patient fallen in last 6 months? 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   OBJECTIVE:    PATIENT SURVEYS:  Modified Oswestry 28%    COGNITION: Overall cognitive status: Within functional limits for tasks assessed     SENSATION: WFL   POSTURE: rounded shoulders and forward head   LUMBAR ROM:   AROM eval  Flexion 25 % limitation   Extension 25% limitation   Right lateral flexion 25 % limitation   Left lateral flexion 25 % limitation   Right rotation   Left rotation    (Blank rows = not tested)  TRUNK STRENGTH:  Decreased activation of transverse abdominus muscles; abdominals 4-/5; decreased activation of lumbar multifidi; trunk extensors 4-/5  LOWER EXTREMITY  ROM:   WFLs  LOWER EXTREMITY MMT:    MMT Right eval Left eval  Hip flexion 5 5  Hip extension 4- 4-  Hip abduction 4- 4-  Hip adduction    Hip internal rotation    Hip external rotation 4- 4-  Knee flexion    Knee extension 5 5  Ankle dorsiflexion    Ankle plantarflexion    Ankle inversion    Ankle eversion     (Blank rows = not tested)    FUNCTIONAL TESTS:  Able to SLS 10 sec with some compensatory trunk lean and rotation   GAIT: Comments: WFLs  TODAY'S TREATMENT:                                                                                                                              DATE: 10/28/22  Pt seen for aquatic therapy today.  Treatment took place in water 3.5-4.75 ft in depth at the [facility] pool. Temp of water was 91.  Pt entered/exited the pool via stairs using alternating pattern with hand rail.  *Intro to setting *unsupported forward, back and side stepping in 4.2 and 3.6 *L stretch; gaining little stretch in LB; completed at ladder (improved stretch) *Seated on noodle: ant/post pelvic tilting; hip hiking; rotation *TrA sets: using solid noodle wide stance then staggered x10 ea *decompression using yellow noodle under arms *Cycling on noodle; hip add/abd; skiing *Walking forward and back between exercises for recovery.  Pt requires the buoyancy and hydrostatic pressure of  water for support, and to offload joints by unweighting joint load by at least 50 % in navel deep water and by at least 75-80% in chest to neck deep water.  Viscosity of the water is needed for resistance of strengthening. Water current perturbations provides challenge to standing balance requiring increased core activation.     10/21/22  Nustep x 10 min level 3 (PT present to discuss goals and progress) Reviewed various braces and supports including proper fitting bra that would be an appropriate option but also discussed breast reduction.  (Patient states she has  already discussed this with her doctor) Mechanical lumbar traction x 15 min (50 lb max/ 30 lb min, 6 step, hold time 60 sec, rest 10 sec, speed 50%)  DATE: 10/15/22  Nustep x 5 min level 3 (PT present to discuss goals and progress) Standing hamstring stretch 2 x 30 sec each LE Standing quad/hip flexor stretch 2 x 30 sec each LE Seated piriformis stretch 2 x 30 sec each LE Supine bridge with feet on box (16" side) with breathing in and engaging pelvic floor x 10 Supine PPT with feet on 16" box with breathing in and engaging pelvic floor x 10 Supine PPT with alternating leg lifts with feet on 16" box with breathing in and engaging pelvic floor x 20 Supine PPT with bridge and clam using yellow loop with feet on 16" box with breathing in and engaging pelvic floor x 20 Moist heat to thoracic and lumbar spine for stiffness x 10 min in hook lying.    DATE: 10/08/22  Nustep x 5 min level 3 (PT present to discuss goals and progress) Standing hamstring stretch 2 x 30 sec each LE Standing quad/hip flexor stretch 2 x 30 sec each LE Seated piriformis stretch 2 x 30 sec each LE Supine PPT x 20 Supine PPT with 90/90 heel tap x 20 Supine PPT with dying bug x 20 Supine SLR with PPT x 20 Trunk rotation x 20 Seated bilateral shoulder ER with red band x 20 Seated horizontal shoulder abduction with red band x 20 Moist heat to thoracic and lumbar spine for stiffness x 10 min in hook lying.    DATE: 09/30/22  Encouraged good sitting posture when breast feeding   PATIENT EDUCATION:  Education details: Educated patient on anatomy and physiology of current symptoms, prognosis, plan of care as well as initial self care strategies to promote recovery  Person educated: Patient Education method: Explanation Education comprehension: verbalized understanding  HOME EXERCISE PROGRAM: Access Code: [email or web address] URL: [email or web address] Date: 10/08/2022 Prepared by: Mikey Kirschner  Exercises -  Sidelying Diaphragmatic Breathing  - 1 x daily - 7 x weekly - 1 sets - 3-5 reps - 30-45s holds - Supine Thoracic Mobilization Foam Roll Horizontal with Arm Stretch  - 1 x daily - 7 x weekly - 1 sets - 3-5 reps - 30-45s holds - Supine Single Knee to Chest Stretch  - 1 x daily - 7 x weekly - 1 sets - 3-5 reps - 30-45s holds - Cat Cow  - 1 x daily - 7 x weekly - 1 sets - 3-5 reps - 30-45s holds - Sitting Wall Angels  - 1 x daily - 7 x weekly - 1 sets - 3-5 reps - 30-45s holds - Standing Hamstring Stretch on Chair  - 1 x daily - 7 x weekly - 1 sets - 3 reps - 20 sec hold - Standing Quad Stretch with Table and Chair Support  - 1  x daily - 7 x weekly - 1 sets - 3 reps - 20 sec hold - Supine Lower Trunk Rotation  - 1 x daily - 7 x weekly - 1 sets - 20 reps - Supine Posterior Pelvic Tilt  - 1 x daily - 7 x weekly - 1 sets - 20 reps - Supine 90/90 Alternating Heel Touches with Posterior Pelvic Tilt  - 1 x daily - 7 x weekly - 1 sets - 20 reps  ASSESSMENT:  CLINICAL IMPRESSION: Pt is safe and indep in setting with therapist instructing from deck. Appears to have good posterior core muscle length as we were challenged to gain a stretch (L stretch). Did successfully gain adequate stretch with KTC on ladder.  She tolerates session fairly well. Does have pelvic cramps with some activities, hydrostatic pressure maybe contributing. Focused on general strengthening, post core stretching and toleration to activity.  She is a good candidate for aquatic intervention and will benefit from the properties of water to meet land based goals.   OBJECTIVE IMPAIRMENTS: decreased activity tolerance, decreased ROM, decreased strength, impaired perceived functional ability, and pain.   ACTIVITY LIMITATIONS: lifting, bending, sitting, standing, locomotion level, and caring for others  PARTICIPATION LIMITATIONS: meal prep, cleaning, laundry, community activity, and occupation  PERSONAL FACTORS: Time since onset of  injury/illness/exacerbation are also affecting patient's functional outcome.   REHAB POTENTIAL: Good  CLINICAL DECISION MAKING: Stable/uncomplicated  EVALUATION COMPLEXITY: Low   GOALS: Goals reviewed with patient? Yes  SHORT TERM GOALS: Target date: 10/28/2022    The patient will demonstrate knowledge of basic self care strategies and exercises to promote healing  Baseline: Goal status: MET  2.  The patient will report a 35% improvement in pain levels with functional activities which are currently difficult including bending and lifting the baby in/out of the crib Baseline:  Goal status: INITIAL  3. Lumbar ROM WFLS in all planes needed for taking care of her children MET 10/21/22    LONG TERM GOALS: Target date: 11/25/2022    The patient will be independent in a safe self progression of a home exercise program to promote further recovery of function  Baseline:  Goal status: INITIAL  2.  The patient will have improved trunk flexor and extensor muscle strength to at least 4+/5 needed for lifting her infant and for return to work in a daycare Baseline:  Goal status: INITIAL  3.  The patient will have improved hip strength to at least 4+/5 needed for standing, walking longer distances and descending stairs at home and in the community  Baseline:  Goal status: INITIAL  4.  Modified Oswestry Index improved to 18% Baseline:  Goal status: INITIAL  5. The patient will report a 70% improvement in pain levels with functional activities which are currently difficult including lifting/bending  and twisting tasks  PLAN:  PT FREQUENCY: 1x/week  PT DURATION: 8 weeks  PLANNED INTERVENTIONS: Therapeutic exercises, Therapeutic activity, Neuromuscular re-education, Patient/Family education, Self Care, Joint mobilization, Dry Needling, Electrical stimulation, Spinal mobilization, Cryotherapy, Moist heat, Taping, Traction, Ultrasound, Manual therapy, and Re-evaluation.  PLAN FOR  NEXT SESSION:  Continue postural and core strengthening,  LE flexibility and pain control.  Pelvic floor focused strengthening.  Corrie Dandy Tomma Lightning) Wildon Cuevas MPT 10/28/22 1:37 PM  [facility] [address] [place], [place] [address] Phone # [phone] Fax [phone]

## 2022-10-29 ENCOUNTER — Encounter: Payer: BC Managed Care – PPO | Admitting: Physical Therapy

## 2022-11-04 ENCOUNTER — Ambulatory Visit: Payer: BC Managed Care – PPO | Attending: Obstetrics

## 2022-11-04 DIAGNOSIS — M25551 Pain in right hip: Secondary | ICD-10-CM | POA: Insufficient documentation

## 2022-11-04 DIAGNOSIS — M25552 Pain in left hip: Secondary | ICD-10-CM | POA: Diagnosis not present

## 2022-11-04 DIAGNOSIS — M5459 Other low back pain: Secondary | ICD-10-CM | POA: Diagnosis not present

## 2022-11-04 DIAGNOSIS — R252 Cramp and spasm: Secondary | ICD-10-CM | POA: Diagnosis not present

## 2022-11-04 DIAGNOSIS — R293 Abnormal posture: Secondary | ICD-10-CM | POA: Insufficient documentation

## 2022-11-04 DIAGNOSIS — M6281 Muscle weakness (generalized): Secondary | ICD-10-CM | POA: Insufficient documentation

## 2022-11-04 DIAGNOSIS — R262 Difficulty in walking, not elsewhere classified: Secondary | ICD-10-CM | POA: Insufficient documentation

## 2022-11-04 NOTE — Therapy (Signed)
OUTPATIENT PHYSICAL THERAPY THORACOLUMBAR TREATMENT NOTE   Patient Name: Kelly Lane MRN: [id number] DOB:November 12, 1995, 27 y.o., female Today's Date: 09/30/2022  END OF SESSION:  PT End of Session - 11/04/22 1020     Visit Number 6    Date for PT Re-Evaluation 11/25/22    Authorization Type medicaid [employer] (secondary)    PT Start Time 1020    PT Stop Time 1102    PT Time Calculation (min) 42 min    Activity Tolerance Patient tolerated treatment well    Behavior During Therapy [facility] for tasks assessed/performed             Past Medical History:  Diagnosis Date   Anxiety    Endometriosis 07/2015   GERD (gastroesophageal reflux disease)    NO MEDS   Ovarian cyst 07/2015   Past Surgical History:  Procedure Laterality Date   CESAREAN SECTION N/A 09/20/2020   Procedure: CESAREAN SECTION;  Surgeon: Tereso Newcomer, MD;  Location: [facility] LD ORS;  Service: Obstetrics;  Laterality: N/A;   LAPAROSCOPIC OVARIAN CYSTECTOMY Right 07/17/2015   Procedure: EXCISION OF ENDOMETRIOSIS;  Surgeon: Nadara Mustard, MD;  Location: [facility] ORS;  Service: Gynecology;  Laterality: Right;   LAPAROSCOPY N/A 07/17/2015   Procedure: LAPAROSCOPY OPERATIVE;  Surgeon: Nadara Mustard, MD;  Location: [facility] ORS;  Service: Gynecology;  Laterality: N/A;   Patient Active Problem List   Diagnosis Date Noted   VBAC (vaginal birth after Cesarean) 08/12/2022   Chronic hypertension affecting pregnancy 08/10/2022   H/O severe pre-eclampsia 04/17/2022   H/O emergency cesarean section 09/20/2020   Rh negative state in antepartum period 06/07/2020      REFERRING PROVIDER: Harper,Charles MD  REFERRING DIAG: back pain following epidural  Rationale for Evaluation and Treatment: Rehabilitation  THERAPY DIAG:  Back pain;weakness  ONSET DATE: April 2024 exacerbation of chronic issue  SUBJECTIVE:                                                                                                                                                                                            SUBJECTIVE STATEMENT: Patient reports she is still having some right wrist pain.  She states that the pool "felt really good".  "I felt like I could just move better"    Patient reports "my wrist stopped hurting but my back still hurts". "Is there anything that can just pull me apart?"  We discuss traction as an option and patient agrees to try this.  Patient also inquires about aquatic therapy for a few sessions to see if this is something that she could do when she is discharged  from formal PT.    PERTINENT HISTORY:  Has seen a chiro in past helped but short term only Has 16 year old son  PAIN:  PAIN:  10/21/22: Are you having pain? Yes NPRS scale: 4-5/10 Pain location: upper lumbar Aggravating factors: turning/twisting; lifting baby in/out of the crib; bending over Relieving factors: crossed legs stretch;  hot shower  PRECAUTIONS: None  WEIGHT BEARING RESTRICTIONS: No  FALLS:  Has patient fallen in last 6 months? 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   OBJECTIVE:    PATIENT SURVEYS:  Modified Oswestry 28%    COGNITION: Overall cognitive status: Within functional limits for tasks assessed     SENSATION: WFL   POSTURE: rounded shoulders and forward head   LUMBAR ROM:   AROM eval  Flexion 25 % limitation   Extension 25% limitation   Right lateral flexion 25 % limitation   Left lateral flexion 25 % limitation   Right rotation   Left rotation    (Blank rows = not tested)  TRUNK STRENGTH:  Decreased activation of transverse abdominus muscles; abdominals 4-/5; decreased activation of lumbar multifidi; trunk extensors 4-/5  LOWER EXTREMITY ROM:   WFLs  LOWER  EXTREMITY MMT:    MMT Right eval Left eval  Hip flexion 5 5  Hip extension 4- 4-  Hip abduction 4- 4-  Hip adduction    Hip internal rotation    Hip external rotation 4- 4-  Knee flexion    Knee extension 5 5  Ankle dorsiflexion    Ankle plantarflexion    Ankle inversion    Ankle eversion     (Blank rows = not tested)    FUNCTIONAL TESTS:  Able to SLS 10 sec with some compensatory trunk lean and rotation   GAIT: Comments: WFLs  TODAY'S TREATMENT:                                                                                                                              11/04/22  Nustep x 10 min level 3 (PT present to discuss goals and progress) Standing hamstring and hip flexor quad stretches 3 x 30 sec both side Mechanical lumbar traction x 15 min (65 lb max/ 50 lb min, 6 step, hold time 60 sec, rest 10 sec, speed 100%)   DATE: 10/28/22  Pt seen for aquatic therapy today.  Treatment took place in water 3.5-4.75 ft in depth at the [facility] pool. Temp of water was 91.  Pt entered/exited the pool via stairs using alternating pattern with hand rail.  *Intro to setting *unsupported forward, back and side stepping in 4.2 and 3.6 *L stretch; gaining little stretch in LB; completed at ladder (improved stretch) *  Seated on noodle: ant/post pelvic tilting; hip hiking; rotation *TrA sets: using solid noodle wide stance then staggered x10 ea *decompression using yellow noodle under arms *Cycling on noodle; hip add/abd; skiing *Walking forward and back between exercises for recovery.  Pt requires the buoyancy and hydrostatic pressure of water for support, and to offload joints by unweighting joint load by at least 50 % in navel deep water and by at least 75-80% in chest to neck deep water.  Viscosity of the water is needed for resistance of strengthening. Water current perturbations provides challenge to standing balance requiring increased core  activation.     10/21/22  Nustep x 10 min level 3 (PT present to discuss goals and progress) Reviewed various braces and supports including proper fitting bra that would be an appropriate option but also discussed breast reduction.  (Patient states she has already discussed this with her doctor) Mechanical lumbar traction x 15 min (50 lb max/ 30 lb min, 6 step, hold time 60 sec, rest 10 sec, speed 50%)  DATE: 10/15/22  Nustep x 5 min level 3 (PT present to discuss goals and progress) Standing hamstring stretch 2 x 30 sec each LE Standing quad/hip flexor stretch 2 x 30 sec each LE Seated piriformis stretch 2 x 30 sec each LE Supine bridge with feet on box (16" side) with breathing in and engaging pelvic floor x 10 Supine PPT with feet on 16" box with breathing in and engaging pelvic floor x 10 Supine PPT with alternating leg lifts with feet on 16" box with breathing in and engaging pelvic floor x 20 Supine PPT with bridge and clam using yellow loop with feet on 16" box with breathing in and engaging pelvic floor x 20 Moist heat to thoracic and lumbar spine for stiffness x 10 min in hook lying.    DATE: 10/08/22  Nustep x 5 min level 3 (PT present to discuss goals and progress) Standing hamstring stretch 2 x 30 sec each LE Standing quad/hip flexor stretch 2 x 30 sec each LE Seated piriformis stretch 2 x 30 sec each LE Supine PPT x 20 Supine PPT with 90/90 heel tap x 20 Supine PPT with dying bug x 20 Supine SLR with PPT x 20 Trunk rotation x 20 Seated bilateral shoulder ER with red band x 20 Seated horizontal shoulder abduction with red band x 20 Moist heat to thoracic and lumbar spine for stiffness x 10 min in hook lying.    DATE: 09/30/22  Encouraged good sitting posture when breast feeding   PATIENT EDUCATION:  Education details: Educated patient on anatomy and physiology of current symptoms, prognosis, plan of care as well as initial self care strategies to promote  recovery  Person educated: Patient Education method: Explanation Education comprehension: verbalized understanding  HOME EXERCISE PROGRAM: Access Code: [email or web address] URL: [email or web address] Date: 10/08/2022 Prepared by: Mikey Kirschner  Exercises - Sidelying Diaphragmatic Breathing  - 1 x daily - 7 x weekly - 1 sets - 3-5 reps - 30-45s holds - Supine Thoracic Mobilization Foam Roll Horizontal with Arm Stretch  - 1 x daily - 7 x weekly - 1 sets - 3-5 reps - 30-45s holds - Supine Single Knee to Chest Stretch  - 1 x daily - 7 x weekly - 1 sets - 3-5 reps - 30-45s holds - Cat Cow  - 1 x daily - 7 x weekly - 1 sets - 3-5 reps - 30-45s holds - Sitting Wall Angels  - 1 x daily -  7 x weekly - 1 sets - 3-5 reps - 30-45s holds - Standing Hamstring Stretch on Chair  - 1 x daily - 7 x weekly - 1 sets - 3 reps - 20 sec hold - Standing Quad Stretch with Table and Chair Support  - 1 x daily - 7 x weekly - 1 sets - 3 reps - 20 sec hold - Supine Lower Trunk Rotation  - 1 x daily - 7 x weekly - 1 sets - 20 reps - Supine Posterior Pelvic Tilt  - 1 x daily - 7 x weekly - 1 sets - 20 reps - Supine 90/90 Alternating Heel Touches with Posterior Pelvic Tilt  - 1 x daily - 7 x weekly - 1 sets - 20 reps  ASSESSMENT:  CLINICAL IMPRESSION: Kelly Lane responds well to mechanical traction.  She reported very little back pain after treatment.  We emphasized need to continue her stretching and core strengthening program to avoid reoccurrance.  She seems to be fairly compliant with her HEP but sporadic.  She would benefit from continuing skilled PT for core stabilization.     OBJECTIVE IMPAIRMENTS: decreased activity tolerance, decreased ROM, decreased strength, impaired perceived functional ability, and pain.   ACTIVITY LIMITATIONS: lifting, bending, sitting, standing, locomotion level, and caring for others  PARTICIPATION LIMITATIONS: meal prep, cleaning, laundry, community activity, and  occupation  PERSONAL FACTORS: Time since onset of injury/illness/exacerbation are also affecting patient's functional outcome.   REHAB POTENTIAL: Good  CLINICAL DECISION MAKING: Stable/uncomplicated  EVALUATION COMPLEXITY: Low   GOALS: Goals reviewed with patient? Yes  SHORT TERM GOALS: Target date: 10/28/2022    The patient will demonstrate knowledge of basic self care strategies and exercises to promote healing  Baseline: Goal status: MET  2.  The patient will report a 35% improvement in pain levels with functional activities which are currently difficult including bending and lifting the baby in/out of the crib Baseline:  Goal status: INITIAL  3. Lumbar ROM WFLS in all planes needed for taking care of her children MET 10/21/22    LONG TERM GOALS: Target date: 11/25/2022    The patient will be independent in a safe self progression of a home exercise program to promote further recovery of function  Baseline:  Goal status: INITIAL  2.  The patient will have improved trunk flexor and extensor muscle strength to at least 4+/5 needed for lifting her infant and for return to work in a daycare Baseline:  Goal status: INITIAL  3.  The patient will have improved hip strength to at least 4+/5 needed for standing, walking longer distances and descending stairs at home and in the community  Baseline:  Goal status: INITIAL  4.  Modified Oswestry Index improved to 18% Baseline:  Goal status: INITIAL  5. The patient will report a 70% improvement in pain levels with functional activities which are currently difficult including lifting/bending  and twisting tasks  PLAN:  PT FREQUENCY: 1x/week  PT DURATION: 8 weeks  PLANNED INTERVENTIONS: Therapeutic exercises, Therapeutic activity, Neuromuscular re-education, Patient/Family education, Self Care, Joint mobilization, Dry Needling, Electrical stimulation, Spinal mobilization, Cryotherapy, Moist heat, Taping, Traction,  Ultrasound, Manual therapy, and Re-evaluation.  PLAN FOR NEXT SESSION:  Continue postural and core strengthening,  LE flexibility and pain control.  Pelvic floor focused strengthening.  Victorino Dike B. Eleny Cortez, PT 11/04/22 4:15 PM  [facility] [address] [place], [place] [address] Phone # [phone] Fax [phone]

## 2022-11-05 ENCOUNTER — Ambulatory Visit
Admission: RE | Admit: 2022-11-05 | Discharge: 2022-11-05 | Disposition: A | Discharge status: Home / Self Care | Payer: BC Managed Care – PPO | Source: Ambulatory Visit | Attending: Obstetrics | Admitting: Obstetrics

## 2022-11-05 ENCOUNTER — Other Ambulatory Visit: Payer: Self-pay | Admitting: Obstetrics

## 2022-11-05 ENCOUNTER — Other Ambulatory Visit (HOSPITAL_COMMUNITY): Payer: Self-pay | Admitting: Obstetrics and Gynecology

## 2022-11-05 DIAGNOSIS — N6325 Unspecified lump in the left breast, overlapping quadrants: Secondary | ICD-10-CM | POA: Diagnosis not present

## 2022-11-05 DIAGNOSIS — N6320 Unspecified lump in the left breast, unspecified quadrant: Secondary | ICD-10-CM

## 2022-11-05 DIAGNOSIS — N6314 Unspecified lump in the right breast, lower inner quadrant: Secondary | ICD-10-CM | POA: Diagnosis not present

## 2022-11-05 DIAGNOSIS — N6310 Unspecified lump in the right breast, unspecified quadrant: Secondary | ICD-10-CM

## 2022-11-05 DIAGNOSIS — N6321 Unspecified lump in the left breast, upper outer quadrant: Secondary | ICD-10-CM

## 2022-11-11 ENCOUNTER — Encounter (HOSPITAL_BASED_OUTPATIENT_CLINIC_OR_DEPARTMENT_OTHER): Payer: Self-pay | Admitting: Physical Therapy

## 2022-11-11 ENCOUNTER — Ambulatory Visit (HOSPITAL_BASED_OUTPATIENT_CLINIC_OR_DEPARTMENT_OTHER): Payer: BC Managed Care – PPO | Attending: Obstetrics | Admitting: Physical Therapy

## 2022-11-11 DIAGNOSIS — R252 Cramp and spasm: Secondary | ICD-10-CM | POA: Diagnosis not present

## 2022-11-11 DIAGNOSIS — M6281 Muscle weakness (generalized): Secondary | ICD-10-CM | POA: Insufficient documentation

## 2022-11-11 DIAGNOSIS — R262 Difficulty in walking, not elsewhere classified: Secondary | ICD-10-CM | POA: Insufficient documentation

## 2022-11-11 DIAGNOSIS — M5459 Other low back pain: Secondary | ICD-10-CM | POA: Diagnosis not present

## 2022-11-11 NOTE — Therapy (Signed)
OUTPATIENT PHYSICAL THERAPY THORACOLUMBAR TREATMENT NOTE   Patient Name: Kelly Lane MRN: [id number] DOB:[birth year], 27 y.o., female Today's Date: 09/30/2022  END OF SESSION:  PT End of Session - 11/11/22 0910     Visit Number 7    Date for PT Re-Evaluation 11/25/22    Authorization Type medicaid [employer] (secondary)    PT Start Time 0901    PT Stop Time 0945    PT Time Calculation (min) 44 min    Activity Tolerance Patient tolerated treatment well    Behavior During Therapy [facility] for tasks assessed/performed             Past Medical History:  Diagnosis Date   Anxiety    Endometriosis 07/2015   GERD (gastroesophageal reflux disease)    NO MEDS   Ovarian cyst 07/2015   Past Surgical History:  Procedure Laterality Date   CESAREAN SECTION N/A 09/20/2020   Procedure: CESAREAN SECTION;  Surgeon: Tereso Newcomer, MD;  Location: [facility] LD ORS;  Service: Obstetrics;  Laterality: N/A;   LAPAROSCOPIC OVARIAN CYSTECTOMY Right 07/17/2015   Procedure: EXCISION OF ENDOMETRIOSIS;  Surgeon: Nadara Mustard, MD;  Location: [facility] ORS;  Service: Gynecology;  Laterality: Right;   LAPAROSCOPY N/A 07/17/2015   Procedure: LAPAROSCOPY OPERATIVE;  Surgeon: Nadara Mustard, MD;  Location: [facility] ORS;  Service: Gynecology;  Laterality: N/A;   Patient Active Problem List   Diagnosis Date Noted   VBAC (vaginal birth after Cesarean) 08/12/2022   Chronic hypertension affecting pregnancy 08/10/2022   H/O severe pre-eclampsia 04/17/2022   H/O emergency cesarean section 09/20/2020   Rh negative state in antepartum period 06/07/2020      REFERRING PROVIDER: Harper,Charles MD  REFERRING DIAG: back pain following epidural  Rationale for Evaluation and Treatment: Rehabilitation  THERAPY DIAG:  Back pain;weakness  ONSET DATE: April 2024 exacerbation of chronic issue  SUBJECTIVE:                                                                                                                                                                                            SUBJECTIVE STATEMENT: "No lbp just stiffness.  Right wrist and hand with increasing pain"  Patient reports "my wrist stopped hurting but my back still hurts". "Is there anything that can just pull me apart?"  We discuss traction as an option and patient agrees to try this.  Patient also inquires about aquatic therapy for a few sessions to see if this is something that she could do when she is discharged from formal PT.    PERTINENT HISTORY:  Has seen a chiro in past helped but short  term only Has 45 year old son  PAIN:  PAIN:  10/21/22: Are you having pain? Yes NPRS scale: 0/10 Pain location: upper lumbar Aggravating factors: turning/twisting; lifting baby in/out of the crib; bending over Relieving factors: crossed legs stretch;  hot shower  PRECAUTIONS: None  WEIGHT BEARING RESTRICTIONS: No  FALLS:  Has patient fallen in last 6 months? 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   OBJECTIVE:    PATIENT SURVEYS:  Modified Oswestry 28%    COGNITION: Overall cognitive status: Within functional limits for tasks assessed     SENSATION: WFL   POSTURE: rounded shoulders and forward head   LUMBAR ROM:   AROM eval  Flexion 25 % limitation   Extension 25% limitation   Right lateral flexion 25 % limitation   Left lateral flexion 25 % limitation   Right rotation   Left rotation    (Blank rows = not tested)  TRUNK STRENGTH:  Decreased activation of transverse abdominus muscles; abdominals 4-/5; decreased activation of lumbar multifidi; trunk extensors 4-/5  LOWER EXTREMITY ROM:   WFLs  LOWER EXTREMITY MMT:    MMT Right eval Left eval  Hip flexion 5 5  Hip extension  4- 4-  Hip abduction 4- 4-  Hip adduction    Hip internal rotation    Hip external rotation 4- 4-  Knee flexion    Knee extension 5 5  Ankle dorsiflexion    Ankle plantarflexion    Ankle inversion    Ankle eversion     (Blank rows = not tested)    FUNCTIONAL TESTS:  Able to SLS 10 sec with some compensatory trunk lean and rotation   GAIT: Comments: WFLs  TODAY'S TREATMENT:                                                                                                                              DATE: 10/28/22  Pt seen for aquatic therapy today.  Treatment took place in water 3.5-4.75 ft in depth at the [facility] pool. Temp of water was 91.  Pt entered/exited the pool via stairs using alternating pattern with hand rail.   *unsupported forward, back and side stepping in 4.2 and 3.6 *L stretch; gaining little stretch in LB; completed at ladder (improved stretch) *Seated on noodle: ant/post pelvic tilting; hip hiking; rotation *TrA sets: using solid noodle wide stance then staggered x5 ea *decompression using yellow noodle under arms *Cycling on noodle *warrior I leading R/L 2x 5 *recovery in crouched position *hip hinges x 10.  Verbal and tactile cues for execution. Glut isometric at end range x 10 *Seated on bench: hip add/abd x 10; flutter kicking seated on edge of bench ue extended (  increase core engagement) 2x20 *Walking forward and back between exercises for recovery.  Pt requires the buoyancy and hydrostatic pressure of water for support, and to offload joints by unweighting joint load by at least 50 % in navel deep water and by at least 75-80% in chest to neck deep water.  Viscosity of the water is needed for resistance of strengthening. Water current perturbations provides challenge to standing balance requiring increased core activation.  11/04/22  Nustep x 10 min level 3 (PT present to discuss goals and progress) Standing hamstring and hip flexor quad  stretches 3 x 30 sec both side Mechanical lumbar traction x 15 min (65 lb max/ 50 lb min, 6 step, hold time 60 sec, rest 10 sec, speed 100%)     10/21/22  Nustep x 10 min level 3 (PT present to discuss goals and progress) Reviewed various braces and supports including proper fitting bra that would be an appropriate option but also discussed breast reduction.  (Patient states she has already discussed this with her doctor) Mechanical lumbar traction x 15 min (50 lb max/ 30 lb min, 6 step, hold time 60 sec, rest 10 sec, speed 50%)  DATE: 10/15/22  Nustep x 5 min level 3 (PT present to discuss goals and progress) Standing hamstring stretch 2 x 30 sec each LE Standing quad/hip flexor stretch 2 x 30 sec each LE Seated piriformis stretch 2 x 30 sec each LE Supine bridge with feet on box (16" side) with breathing in and engaging pelvic floor x 10 Supine PPT with feet on 16" box with breathing in and engaging pelvic floor x 10 Supine PPT with alternating leg lifts with feet on 16" box with breathing in and engaging pelvic floor x 20 Supine PPT with bridge and clam using yellow loop with feet on 16" box with breathing in and engaging pelvic floor x 20 Moist heat to thoracic and lumbar spine for stiffness x 10 min in hook lying.    DATE: 10/08/22  Nustep x 5 min level 3 (PT present to discuss goals and progress) Standing hamstring stretch 2 x 30 sec each LE Standing quad/hip flexor stretch 2 x 30 sec each LE Seated piriformis stretch 2 x 30 sec each LE Supine PPT x 20 Supine PPT with 90/90 heel tap x 20 Supine PPT with dying bug x 20 Supine SLR with PPT x 20 Trunk rotation x 20 Seated bilateral shoulder ER with red band x 20 Seated horizontal shoulder abduction with red band x 20 Moist heat to thoracic and lumbar spine for stiffness x 10 min in hook lying.    DATE: 09/30/22  Encouraged good sitting posture when breast feeding   PATIENT EDUCATION:  Education details: Educated patient on  anatomy and physiology of current symptoms, prognosis, plan of care as well as initial self care strategies to promote recovery  Person educated: Patient Education method: Explanation Education comprehension: verbalized understanding  HOME EXERCISE PROGRAM: Access Code: [email or web address] URL: [email or web address] Date: 10/08/2022 Prepared by: Mikey Kirschner  Exercises - Sidelying Diaphragmatic Breathing  - 1 x daily - 7 x weekly - 1 sets - 3-5 reps - 30-45s holds - Supine Thoracic Mobilization Foam Roll Horizontal with Arm Stretch  - 1 x daily - 7 x weekly - 1 sets - 3-5 reps - 30-45s holds - Supine Single Knee to Chest Stretch  - 1 x daily - 7 x weekly - 1 sets - 3-5 reps - 30-45s holds - Cat Cow  - 1 x  daily - 7 x weekly - 1 sets - 3-5 reps - 30-45s holds - Sitting Wall Angels  - 1 x daily - 7 x weekly - 1 sets - 3-5 reps - 30-45s holds - Standing Hamstring Stretch on Chair  - 1 x daily - 7 x weekly - 1 sets - 3 reps - 20 sec hold - Standing Quad Stretch with Table and Chair Support  - 1 x daily - 7 x weekly - 1 sets - 3 reps - 20 sec hold - Supine Lower Trunk Rotation  - 1 x daily - 7 x weekly - 1 sets - 20 reps - Supine Posterior Pelvic Tilt  - 1 x daily - 7 x weekly - 1 sets - 20 reps - Supine 90/90 Alternating Heel Touches with Posterior Pelvic Tilt  - 1 x daily - 7 x weekly - 1 sets - 20 reps  ASSESSMENT:  CLINICAL IMPRESSION: Pt reports minimal completion of HEP. She is instructed of importance of completion for long term effect.  She does report decrease in LBP today with 0/10 pain just stiffness.  She is instructed in seated pelvic tilts and standing L stretch for ease of completion during day.  Pelvic mobilization seated on noodle caused a slight flare in abdominal cramping which resolves with modifying exercise and decompression. Increased core engagement which she tolerates well. Pt reports decreased stiffness and no LBP by end of session.  Next session last scheduled.   She does not have pool access and does not expect to gain access.  She is encouraged to continue all land based exercises as instructed by land based PT. Showing good progress towards goals.   OBJECTIVE IMPAIRMENTS: decreased activity tolerance, decreased ROM, decreased strength, impaired perceived functional ability, and pain.   ACTIVITY LIMITATIONS: lifting, bending, sitting, standing, locomotion level, and caring for others  PARTICIPATION LIMITATIONS: meal prep, cleaning, laundry, community activity, and occupation  PERSONAL FACTORS: Time since onset of injury/illness/exacerbation are also affecting patient's functional outcome.   REHAB POTENTIAL: Good  CLINICAL DECISION MAKING: Stable/uncomplicated  EVALUATION COMPLEXITY: Low   GOALS: Goals reviewed with patient? Yes  SHORT TERM GOALS: Target date: 10/28/2022    The patient will demonstrate knowledge of basic self care strategies and exercises to promote healing  Baseline: Goal status: MET  2.  The patient will report a 35% improvement in pain levels with functional activities which are currently difficult including bending and lifting the baby in/out of the crib Baseline:  Goal status: INITIAL  3. Lumbar ROM WFLS in all planes needed for taking care of her children MET 10/21/22    LONG TERM GOALS: Target date: 11/25/2022    The patient will be independent in a safe self progression of a home exercise program to promote further recovery of function  Baseline:  Goal status: INITIAL  2.  The patient will have improved trunk flexor and extensor muscle strength to at least 4+/5 needed for lifting her infant and for return to work in a daycare Baseline:  Goal status: INITIAL  3.  The patient will have improved hip strength to at least 4+/5 needed for standing, walking longer distances and descending stairs at home and in the community  Baseline:  Goal status: INITIAL  4.  Modified Oswestry Index improved to  18% Baseline:  Goal status: INITIAL  5. The patient will report a 70% improvement in pain levels with functional activities which are currently difficult including lifting/bending  and twisting tasks  PLAN:  PT  FREQUENCY: 1x/week  PT DURATION: 8 weeks  PLANNED INTERVENTIONS: Therapeutic exercises, Therapeutic activity, Neuromuscular re-education, Patient/Family education, Self Care, Joint mobilization, Dry Needling, Electrical stimulation, Spinal mobilization, Cryotherapy, Moist heat, Taping, Traction, Ultrasound, Manual therapy, and Re-evaluation.  PLAN FOR NEXT SESSION:  Continue postural and core strengthening,  LE flexibility and pain control.  Pelvic floor focused strengthening.  Corrie Dandy Fowler) Isys Tietje MPT 11/11/22 9:12 AM  [facility] [address], [address] [place], [place] [address] Phone # [phone] Fax [phone]

## 2022-11-12 ENCOUNTER — Ambulatory Visit
Admission: RE | Admit: 2022-11-12 | Discharge: 2022-11-12 | Disposition: A | Discharge status: Home / Self Care | Payer: BC Managed Care – PPO | Source: Ambulatory Visit | Attending: Obstetrics and Gynecology | Admitting: Obstetrics and Gynecology

## 2022-11-12 DIAGNOSIS — N6325 Unspecified lump in the left breast, overlapping quadrants: Secondary | ICD-10-CM | POA: Diagnosis not present

## 2022-11-12 DIAGNOSIS — N6320 Unspecified lump in the left breast, unspecified quadrant: Secondary | ICD-10-CM

## 2022-11-12 HISTORY — PX: BREAST BIOPSY: SHX20

## 2022-11-18 ENCOUNTER — Ambulatory Visit (HOSPITAL_BASED_OUTPATIENT_CLINIC_OR_DEPARTMENT_OTHER): Payer: BC Managed Care – PPO | Admitting: Physical Therapy

## 2022-11-26 ENCOUNTER — Ambulatory Visit: Payer: BC Managed Care – PPO

## 2022-11-26 DIAGNOSIS — R262 Difficulty in walking, not elsewhere classified: Secondary | ICD-10-CM

## 2022-11-26 DIAGNOSIS — M25551 Pain in right hip: Secondary | ICD-10-CM

## 2022-11-26 DIAGNOSIS — R252 Cramp and spasm: Secondary | ICD-10-CM

## 2022-11-26 DIAGNOSIS — M5459 Other low back pain: Secondary | ICD-10-CM | POA: Diagnosis not present

## 2022-11-26 DIAGNOSIS — M25552 Pain in left hip: Secondary | ICD-10-CM

## 2022-11-26 DIAGNOSIS — M6281 Muscle weakness (generalized): Secondary | ICD-10-CM | POA: Diagnosis not present

## 2022-11-26 DIAGNOSIS — R293 Abnormal posture: Secondary | ICD-10-CM | POA: Diagnosis not present

## 2022-11-26 NOTE — Therapy (Signed)
OUTPATIENT PHYSICAL THERAPY THORACOLUMBAR TREATMENT NOTE   Patient Name: Kelly Lane MRN: [id number] DOB:09/07/1995, 27 y.o., female Today's Date: 09/30/2022  END OF SESSION:  PT End of Session - 11/26/22 1029     Visit Number 8    Date for PT Re-Evaluation 01/21/23    Authorization Type medicaid [employer] (secondary)    PT Start Time 1020    PT Stop Time 1100    PT Time Calculation (min) 40 min    Activity Tolerance Patient tolerated treatment well    Behavior During Therapy [facility] for tasks assessed/performed             Past Medical History:  Diagnosis Date   Anxiety    Endometriosis 07/2015   GERD (gastroesophageal reflux disease)    NO MEDS   Ovarian cyst 07/2015   Past Surgical History:  Procedure Laterality Date   BREAST BIOPSY Left 11/12/2022   [place] LT BREAST BX W LOC DEV 1ST LESION IMG BX SPEC US GUIDE 11/12/2022 [facility]MOGRAP[facility]   CESAREAN SECTION N/A 09/20/2020   Procedure: CESAREAN SECTION;  Surgeon: Tereso Newcomer, MD;  Location: [facility];  Service: Obstetrics;  Laterality: N/A;   LAPAROSCOPIC OVARIAN CYSTECTOMY Right 07/17/2015   Procedure: EXCISION OF ENDOMETRIOSIS;  Surgeon: Nadara Mustard, MD;  Location: [facility] ORS;  Service: Gynecology;  Laterality: Right;   LAPAROSCOPY N/A 07/17/2015   Procedure: LAPAROSCOPY OPERATIVE;  Surgeon: Nadara Mustard, MD;  Location: [facility] ORS;  Service: Gynecology;  Laterality: N/A;   Patient Active Problem List   Diagnosis Date Noted   VBAC (vaginal birth after Cesarean) 08/12/2022   Chronic hypertension affecting pregnancy 08/10/2022   H/O severe pre-eclampsia 04/17/2022   H/O emergency cesarean section 09/20/2020   Rh negative state in antepartum period 06/07/2020      REFERRING PROVIDER: Harper,Charles MD  REFERRING DIAG: back pain following epidural  Rationale for Evaluation and Treatment: Rehabilitation  THERAPY DIAG:  Back pain;weakness  ONSET DATE: April 2024 exacerbation of chronic  issue  SUBJECTIVE:                                                                                                                                                                                           SUBJECTIVE STATEMENT: Patient reports that overall, her back is better. Most of the pain is in her hips now.  She also c/o continued right hand and wrist pain.  PERTINENT HISTORY:  Has seen a chiro in past helped but short term only Has 35 year old son  PAIN:  PAIN:  11/26/22: Are you having pain? Yes NPRS scale: 3-4/10 Pain location: mild  in lumbar area but more in hips today Aggravating factors: turning/twisting; lifting baby in/out of the crib; bending over Relieving factors: crossed legs stretch;  hot shower  PRECAUTIONS: None  WEIGHT BEARING RESTRICTIONS: No  FALLS:  Has patient fallen in last 6 months? 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   OBJECTIVE:    PATIENT SURVEYS:  Modified Oswestry 28%   11/26/22: (answers based on patient's subjective comments): 12/50 (mild disability) 24%   COGNITION: Overall cognitive status: Within functional limits for tasks assessed     SENSATION: WFL   POSTURE: rounded shoulders and forward head   LUMBAR ROM:   AROM eval 11/26/22  Flexion 25 % limitation  25% and primarily due to thoracic pain  Extension 25% limitation  WFL  Right lateral flexion 25 % limitation  WFL  Left lateral flexion 25 % limitation  WFL  Right rotation  WFL  Left rotation  WFL   (Blank rows = not tested)  TRUNK STRENGTH:  Decreased activation of transverse abdominus muscles; abdominals 4-/5; decreased activation of lumbar multifidi; trunk extensors 4-/5 11/26/22: Transverse abdominus: 4/5,  lumbar multifidi  4/5  LOWER EXTREMITY ROM:   WFLs  LOWER EXTREMITY MMT:    MMT Right eval Right 11/26/22 Left eval Left 11/26/22  Hip flexion 5 5 5 5   Hip extension 4- 4 4- 4  Hip abduction 4- 4 4- 4  Hip adduction      Hip internal rotation      Hip external rotation 4- 4 4- 4  Knee flexion      Knee extension 5 5 5 5   Ankle dorsiflexion      Ankle plantarflexion      Ankle inversion      Ankle eversion       (Blank rows = not tested)    FUNCTIONAL TESTS:  Able to SLS 10 sec with some compensatory trunk lean and rotation  11/26/22: continued compensatory trunk lean but decreased rotation  GAIT: Comments: WFLs  TODAY'S TREATMENT:                                                                                                                              DATE: 11/26/22 Nustep x 5 min level 5 (instructed patient to increase her speed a little for some resistance) Lateral band walks x 3 laps of 20 feet with blue loop Seated clam x 20 with blue loop Supine clam x 20 with blue loop  Bridging x 20 Side lying clam with blue loop x 20 each side (gave patient option of 2 x 10) Supine SLR 2 x 10 each LE Side lying hip abduction 2 x 10 each LE Prone hip extension 2 x  10 each LE  DATE: 10/28/22 Pt seen for aquatic therapy today.  Treatment took place in water 3.5-4.75 ft in depth at the [facility] pool. Temp of water was 91.  Pt entered/exited the pool via stairs using alternating pattern with hand rail. *unsupported forward, back and side stepping in 4.2 and 3.6 *L stretch; gaining little stretch in LB; completed at ladder (improved stretch) *Seated on noodle: ant/post pelvic tilting; hip hiking; rotation *TrA sets: using solid noodle wide stance then staggered x5 ea *decompression using yellow noodle under arms *Cycling on noodle *warrior I leading R/L 2x 5 *recovery in crouched position *hip hinges x 10.  Verbal and tactile cues for execution. Glut isometric at end range x  10 *Seated on bench: hip add/abd x 10; flutter kicking seated on edge of bench ue extended (increase core engagement) 2x20 *Walking forward and back between exercises for recovery. Pt requires the buoyancy and hydrostatic pressure of water for support, and to offload joints by unweighting joint load by at least 50 % in navel deep water and by at least 75-80% in chest to neck deep water.  Viscosity of the water is needed for resistance of strengthening. Water current perturbations provides challenge to standing balance requiring increased core activation.  11/04/22  Nustep x 10 min level 3 (PT present to discuss goals and progress) Standing hamstring and hip flexor quad stretches 3 x 30 sec both side Mechanical lumbar traction x 15 min (65 lb max/ 50 lb min, 6 step, hold time 60 sec, rest 10 sec, speed 100%)  10/21/22  Nustep x 10 min level 3 (PT present to discuss goals and progress) Reviewed various braces and supports including proper fitting bra that would be an appropriate option but also discussed breast reduction.  (Patient states she has already discussed this with her doctor) Mechanical lumbar traction x 15 min (50 lb max/ 30 lb min, 6 step, hold time 60 sec, rest 10 sec, speed 50%)  DATE: 10/15/22  Nustep x 5 min level 3 (PT present to discuss goals and progress) Standing hamstring stretch 2 x 30 sec each LE Standing quad/hip flexor stretch 2 x 30 sec each LE Seated piriformis stretch 2 x 30 sec each LE Supine bridge with feet on box (16" side) with breathing in and engaging pelvic floor x 10 Supine PPT with feet on 16" box with breathing in and engaging pelvic floor x 10 Supine PPT with alternating leg lifts with feet on 16" box with breathing in and engaging pelvic floor x 20 Supine PPT with bridge and clam using yellow loop with feet on 16" box with breathing in and engaging pelvic floor x 20 Moist heat to thoracic and lumbar spine for stiffness x 10 min in hook lying.    DATE:  10/08/22  Nustep x 5 min level 3 (PT present to discuss goals and progress) Standing hamstring stretch 2 x 30 sec each LE Standing quad/hip flexor stretch 2 x 30 sec each LE Seated piriformis stretch 2 x 30 sec each LE Supine PPT x 20 Supine PPT with 90/90 heel tap x 20 Supine PPT with dying bug x 20 Supine SLR with PPT x 20 Trunk rotation x 20 Seated bilateral shoulder ER with red band x 20 Seated horizontal shoulder abduction with red band x 20 Moist heat to thoracic and lumbar spine for stiffness x 10 min in hook lying.    DATE: 09/30/22  Encouraged good sitting posture when breast feeding   PATIENT EDUCATION:  Education details: Educated patient on anatomy and physiology of current symptoms, prognosis, plan of care as well as initial self care strategies to promote recovery  Person educated: Patient Education method: Explanation Education comprehension: verbalized understanding  HOME EXERCISE PROGRAM: Access Code: [email or web address] URL: [email or web address] Date: 10/08/2022 Prepared by: Mikey Kirschner  Exercises - Sidelying Diaphragmatic Breathing  - 1 x daily - 7 x weekly - 1 sets - 3-5 reps - 30-45s holds - Supine Thoracic Mobilization Foam Roll Horizontal with Arm Stretch  - 1 x daily - 7 x weekly - 1 sets - 3-5 reps - 30-45s holds - Supine Single Knee to Chest Stretch  - 1 x daily - 7 x weekly - 1 sets - 3-5 reps - 30-45s holds - Cat Cow  - 1 x daily - 7 x weekly - 1 sets - 3-5 reps - 30-45s holds - Sitting Wall Angels  - 1 x daily - 7 x weekly - 1 sets - 3-5 reps - 30-45s holds - Standing Hamstring Stretch on Chair  - 1 x daily - 7 x weekly - 1 sets - 3 reps - 20 sec hold - Standing Quad Stretch with Table and Chair Support  - 1 x daily - 7 x weekly - 1 sets - 3 reps - 20 sec hold - Supine Lower Trunk Rotation  - 1 x daily - 7 x weekly - 1 sets - 20 reps - Supine Posterior Pelvic Tilt  - 1 x daily - 7 x weekly - 1 sets - 20 reps - Supine 90/90 Alternating Heel  Touches with Posterior Pelvic Tilt  - 1 x daily - 7 x weekly - 1 sets - 20 reps  ASSESSMENT:  CLINICAL IMPRESSION: Tayte is making steady progress.  She is struggling with some lower thoracic pain due to excessive breast tissue post partum.  This seems to cause her to compensate heavily with daily functional task which may be contributing to her lumbar and hip pain.  We began a more aggressive strengthening program today.  Patient educated on possible soreness.  We discussed dry needling as another approach to her hip and back pain.  She agreed to read the literature and let us know.  We will recertify to continue PT for a little longer to work on total body strength and core stabilization along with possible dry needling.     OBJECTIVE IMPAIRMENTS: decreased activity tolerance, decreased ROM, decreased strength, impaired perceived functional ability, and pain.   ACTIVITY LIMITATIONS: lifting, bending, sitting, standing, locomotion level, and caring for others  PARTICIPATION LIMITATIONS: meal prep, cleaning, laundry, community activity, and occupation  PERSONAL FACTORS: Time since onset of injury/illness/exacerbation are also affecting patient's functional outcome.   REHAB POTENTIAL: Good  CLINICAL DECISION MAKING: Stable/uncomplicated  EVALUATION COMPLEXITY: Low   GOALS: Goals reviewed with patient? Yes  SHORT TERM GOALS: Target date: 10/28/2022    The patient will demonstrate knowledge of basic self care strategies and exercises to promote healing  Baseline: Goal status: MET  2.  The patient will report a 35% improvement in pain levels with functional activities which are currently difficult including bending and lifting the baby in/out of the crib Baseline:  Goal status: MET 11/26/22  3. Lumbar ROM WFLS in all planes needed for taking care of her children MET 10/21/22    LONG TERM GOALS: Target date: 11/25/2022    The patient will be independent in a safe self  progression of a home exercise program to promote further recovery of  function  Baseline:  Goal status: IN PROGRESS  2.  The patient will have improved trunk flexor and extensor muscle strength to at least 4+/5 needed for lifting her infant and for return to work in a daycare Baseline:  Goal status: IN PROGRESS  3.  The patient will have improved hip strength to at least 4+/5 needed for standing, walking longer distances and descending stairs at home and in the community  Baseline:  Goal status: IN PROGRESS  4.  Modified Oswestry Index improved to 18% Baseline: Improved by 4% since initial eval Goal status: IN PROGRESS  5. The patient will report a 70% improvement in pain levels with functional activities which are currently difficult including lifting/bending  and twisting tasks   Goal status: In progress  PLAN:  PT FREQUENCY: continue 1x/week  PT DURATION: for an additional 8 weeks  PLANNED INTERVENTIONS: Therapeutic exercises, Therapeutic activity, Neuromuscular re-education, Patient/Family education, Self Care, Joint mobilization, Dry Needling, Electrical stimulation, Spinal mobilization, Cryotherapy, Moist heat, Taping, Traction, Ultrasound, Manual therapy, and Re-evaluation.  PLAN FOR NEXT SESSION:  Continue postural and core strengthening,  LE flexibility and pain control.  Pelvic floor focused strengthening.  Victorino Dike B. Trebor Galdamez, PT 11/26/22 12:20 PM [facility] [address] [place], [place] [address] Phone # [phone] Fax [phone]

## 2022-12-03 NOTE — Therapy (Signed)
OUTPATIENT PHYSICAL THERAPY THORACOLUMBAR TREATMENT NOTE   Patient Name: Kelly Lane MRN: [id number] DOB:[birth year], 27 y.o., female Today's Date: 09/30/2022  END OF SESSION:    Past Medical History:  Diagnosis Date   Anxiety    Endometriosis 07/2015   GERD (gastroesophageal reflux disease)    NO MEDS   Ovarian cyst 07/2015   Past Surgical History:  Procedure Laterality Date   BREAST BIOPSY Left 11/12/2022   [place] LT BREAST BX W LOC DEV 1ST LESION IMG BX SPEC US GUIDE 11/12/2022 [facility]MO[facility]   CESAREAN SECTION N/A 09/20/2020   Procedure: CESAREAN SECTION;  Surgeon: Tereso Newcomer, MD;  Location: [facility] LD ORS;  Service: Obstetrics;  Laterality: N/A;   LAPAROSCOPIC OVARIAN CYSTECTOMY Right 07/17/2015   Procedure: EXCISION OF ENDOMETRIOSIS;  Surgeon: Nadara Mustard, MD;  Location: [facility] ORS;  Service: Gynecology;  Laterality: Right;   LAPAROSCOPY N/A 07/17/2015   Procedure: LAPAROSCOPY OPERATIVE;  Surgeon: Nadara Mustard, MD;  Location: [facility] ORS;  Service: Gynecology;  Laterality: N/A;   Patient Active Problem List   Diagnosis Date Noted   VBAC (vaginal birth after Cesarean) 08/12/2022   Chronic hypertension affecting pregnancy 08/10/2022   H/O severe pre-eclampsia 04/17/2022   H/O emergency cesarean section 09/20/2020   Rh negative state in antepartum period 06/07/2020      REFERRING PROVIDER: Harper,Charles MD  REFERRING DIAG: back pain following epidural  Rationale for Evaluation and Treatment: Rehabilitation  THERAPY DIAG:  Back pain;weakness  ONSET DATE: April 2024 exacerbation of chronic issue  SUBJECTIVE:                                                                                                                                                                                           SUBJECTIVE STATEMENT: ***  PERTINENT HISTORY:  Has seen a chiro in past helped but short term only Has 75 year old son  PAIN:  PAIN:  11/26/22: Are you having pain?  Yes NPRS scale: 3-4/10 Pain location: mild in lumbar area but more in hips today Aggravating factors: turning/twisting; lifting baby in/out of the crib; bending over Relieving factors: crossed legs stretch;  hot shower  PRECAUTIONS: None  WEIGHT BEARING RESTRICTIONS: No  FALLS:  Has patient fallen in last 6 months? 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   OBJECTIVE:    PATIENT SURVEYS:  Modified Oswestry 28%  11/26/22: (answers based on patient's subjective comments): 12/50 (mild disability) 24%   COGNITION: Overall cognitive status: Within functional limits for tasks assessed     SENSATION: WFL   POSTURE: rounded shoulders and forward head   LUMBAR ROM:   AROM eval 11/26/22  Flexion 25 % limitation  25% and primarily due to thoracic pain  Extension 25% limitation  WFL  Right lateral flexion 25 % limitation  WFL  Left lateral flexion 25 % limitation  WFL  Right rotation  WFL  Left rotation  WFL   (Blank rows = not tested)  TRUNK STRENGTH:  Decreased activation of transverse abdominus muscles; abdominals 4-/5; decreased activation of lumbar multifidi; trunk extensors 4-/5 11/26/22: Transverse abdominus: 4/5,  lumbar multifidi 4/5  LOWER EXTREMITY ROM:   WFLs  LOWER EXTREMITY MMT:    MMT Right eval Right 11/26/22 Left eval Left 11/26/22  Hip flexion 5 5 5 5   Hip extension 4- 4 4- 4  Hip abduction 4- 4 4- 4  Hip adduction      Hip internal rotation      Hip external rotation 4- 4 4- 4  Knee flexion      Knee extension 5 5 5 5   Ankle dorsiflexion      Ankle plantarflexion      Ankle inversion      Ankle eversion       (Blank rows = not tested)    FUNCTIONAL TESTS:  Able to SLS 10 sec with some compensatory trunk  lean and rotation  11/26/22: continued compensatory trunk lean but decreased rotation  GAIT: Comments: WFLs  TODAY'S TREATMENT:                                                                                                                              DATE: 11/26/22 Nustep x 5 min level 5 (instructed patient to increase her speed a little for some resistance) Lateral band walks x 3 laps of 20 feet with blue loop Seated clam x 20 with blue loop Supine clam x 20 with blue loop  Bridging x 20 Side lying clam with blue loop x 20 each side (gave patient option of 2 x 10) Supine SLR 2 x 10 each LE Side lying hip abduction 2 x 10 each LE Prone hip extension 2 x 10 each LE  DATE: 11/26/22 Nustep x 5 min level 5 (instructed patient to increase her speed a little for some resistance) Lateral band walks x 3 laps of 20 feet with blue loop Seated clam x 20 with blue loop Supine clam x 20 with blue loop  Bridging x 20 Side lying clam with blue loop x 20 each side (gave patient option of 2 x 10) Supine SLR 2 x 10 each LE Side lying hip abduction 2 x 10 each LE Prone hip extension 2 x 10 each LE  DATE: 10/28/22 Pt seen for aquatic therapy today.  Treatment took place in water  3.5-4.75 ft in depth at the [facility] pool. Temp of water was 91.  Pt entered/exited the pool via stairs using alternating pattern with hand rail. *unsupported forward, back and side stepping in 4.2 and 3.6 *L stretch; gaining little stretch in LB; completed at ladder (improved stretch) *Seated on noodle: ant/post pelvic tilting; hip hiking; rotation *TrA sets: using solid noodle wide stance then staggered x5 ea *decompression using yellow noodle under arms *Cycling on noodle *warrior I leading R/L 2x 5 *recovery in crouched position *hip hinges x 10.  Verbal and tactile cues for execution. Glut isometric at end range x 10 *Seated on bench: hip add/abd x 10; flutter kicking seated on edge of bench ue extended  (increase core engagement) 2x20 *Walking forward and back between exercises for recovery. Pt requires the buoyancy and hydrostatic pressure of water for support, and to offload joints by unweighting joint load by at least 50 % in navel deep water and by at least 75-80% in chest to neck deep water.  Viscosity of the water is needed for resistance of strengthening. Water current perturbations provides challenge to standing balance requiring increased core activation.  11/04/22  Nustep x 10 min level 3 (PT present to discuss goals and progress) Standing hamstring and hip flexor quad stretches 3 x 30 sec both side Mechanical lumbar traction x 15 min (65 lb max/ 50 lb min, 6 step, hold time 60 sec, rest 10 sec, speed 100%)  10/21/22  Nustep x 10 min level 3 (PT present to discuss goals and progress) Reviewed various braces and supports including proper fitting bra that would be an appropriate option but also discussed breast reduction.  (Patient states she has already discussed this with her doctor) Mechanical lumbar traction x 15 min (50 lb max/ 30 lb min, 6 step, hold time 60 sec, rest 10 sec, speed 50%)  PATIENT EDUCATION:  Education details: Educated patient on anatomy and physiology of current symptoms, prognosis, plan of care as well as initial self care strategies to promote recovery  Person educated: Patient Education method: Explanation Education comprehension: verbalized understanding  HOME EXERCISE PROGRAM: Access Code: [email or web address] URL: [email or web address] Date: 10/08/2022 Prepared by: Mikey Kirschner  Exercises - Sidelying Diaphragmatic Breathing  - 1 x daily - 7 x weekly - 1 sets - 3-5 reps - 30-45s holds - Supine Thoracic Mobilization Foam Roll Horizontal with Arm Stretch  - 1 x daily - 7 x weekly - 1 sets - 3-5 reps - 30-45s holds - Supine Single Knee to Chest Stretch  - 1 x daily - 7 x weekly - 1 sets - 3-5 reps - 30-45s holds - Cat Cow  - 1 x daily - 7 x weekly -  1 sets - 3-5 reps - 30-45s holds - Sitting Wall Angels  - 1 x daily - 7 x weekly - 1 sets - 3-5 reps - 30-45s holds - Standing Hamstring Stretch on Chair  - 1 x daily - 7 x weekly - 1 sets - 3 reps - 20 sec hold - Standing Quad Stretch with Table and Chair Support  - 1 x daily - 7 x weekly - 1 sets - 3 reps - 20 sec hold - Supine Lower Trunk Rotation  - 1 x daily - 7 x weekly - 1 sets - 20 reps - Supine Posterior Pelvic Tilt  - 1 x daily - 7 x weekly - 1 sets - 20 reps - Supine 90/90 Alternating Heel Touches with Posterior Pelvic Tilt  -  1 x daily - 7 x weekly - 1 sets - 20 reps  ASSESSMENT:  CLINICAL IMPRESSION: ***   OBJECTIVE IMPAIRMENTS: decreased activity tolerance, decreased ROM, decreased strength, impaired perceived functional ability, and pain.   ACTIVITY LIMITATIONS: lifting, bending, sitting, standing, locomotion level, and caring for others  PARTICIPATION LIMITATIONS: meal prep, cleaning, laundry, community activity, and occupation  PERSONAL FACTORS: Time since onset of injury/illness/exacerbation are also affecting patient's functional outcome.   REHAB POTENTIAL: Good  CLINICAL DECISION MAKING: Stable/uncomplicated  EVALUATION COMPLEXITY: Low   GOALS: Goals reviewed with patient? Yes  SHORT TERM GOALS: Target date: 10/28/2022    The patient will demonstrate knowledge of basic self care strategies and exercises to promote healing  Baseline: Goal status: MET  2.  The patient will report a 35% improvement in pain levels with functional activities which are currently difficult including bending and lifting the baby in/out of the crib Baseline:  Goal status: MET 11/26/22  3. Lumbar ROM WFLS in all planes needed for taking care of her children MET 10/21/22    LONG TERM GOALS: Target date: 11/25/2022 extended to 01/20/23    The patient will be independent in a safe self progression of a home exercise program to promote further recovery of function  Baseline:   Goal status: IN PROGRESS  2.  The patient will have improved trunk flexor and extensor muscle strength to at least 4+/5 needed for lifting her infant and for return to work in a daycare Baseline:  Goal status: IN PROGRESS  3.  The patient will have improved hip strength to at least 4+/5 needed for standing, walking longer distances and descending stairs at home and in the community  Baseline:  Goal status: IN PROGRESS  4.  Modified Oswestry Index improved to 18% Baseline: Improved by 4% since initial eval Goal status: IN PROGRESS  5. The patient will report a 70% improvement in pain levels with functional activities which are currently difficult including lifting/bending  and twisting tasks   Goal status: In progress  PLAN:  PT FREQUENCY: continue 1x/week  PT DURATION: for an additional 8 weeks  PLANNED INTERVENTIONS: Therapeutic exercises, Therapeutic activity, Neuromuscular re-education, Patient/Family education, Self Care, Joint mobilization, Dry Needling, Electrical stimulation, Spinal mobilization, Cryotherapy, Moist heat, Taping, Traction, Ultrasound, Manual therapy, and Re-evaluation.  PLAN FOR NEXT SESSION:  Continue postural and core strengthening,  LE flexibility and pain control.  Pelvic floor focused strengthening.  Solon Palm, PT  12/03/22 8:50 PM [facility] [address] [place], [place] [address] Phone # [phone] Fax [phone]

## 2022-12-04 ENCOUNTER — Ambulatory Visit: Payer: BC Managed Care – PPO | Attending: Obstetrics | Admitting: Physical Therapy

## 2022-12-04 ENCOUNTER — Encounter: Payer: Self-pay | Admitting: Physical Therapy

## 2022-12-04 DIAGNOSIS — M25552 Pain in left hip: Secondary | ICD-10-CM | POA: Diagnosis present

## 2022-12-04 DIAGNOSIS — M6281 Muscle weakness (generalized): Secondary | ICD-10-CM | POA: Diagnosis present

## 2022-12-04 DIAGNOSIS — M25551 Pain in right hip: Secondary | ICD-10-CM

## 2022-12-04 DIAGNOSIS — M5459 Other low back pain: Secondary | ICD-10-CM | POA: Diagnosis present

## 2022-12-04 DIAGNOSIS — R293 Abnormal posture: Secondary | ICD-10-CM

## 2022-12-04 DIAGNOSIS — R262 Difficulty in walking, not elsewhere classified: Secondary | ICD-10-CM | POA: Diagnosis present

## 2022-12-04 DIAGNOSIS — R252 Cramp and spasm: Secondary | ICD-10-CM | POA: Diagnosis present

## 2022-12-10 NOTE — Therapy (Incomplete)
OUTPATIENT PHYSICAL THERAPY THORACOLUMBAR TREATMENT NOTE   Patient Name: Kelly Lane MRN: [id number] DOB:02-25-1996, 27 y.o., female Today's Date: 09/30/2022  END OF SESSION:     Past Medical History:  Diagnosis Date   Anxiety    Endometriosis 07/2015   GERD (gastroesophageal reflux disease)    NO MEDS   Ovarian cyst 07/2015   Past Surgical History:  Procedure Laterality Date   BREAST BIOPSY Left 11/12/2022   [place] LT BREAST BX W LOC DEV 1ST LESION IMG BX SPEC US GUIDE 11/12/2022 [facility]   CESAREAN SECTION N/A 09/20/2020   Procedure: CESAREAN SECTION;  Surgeon: Tereso Newcomer, MD;  Location: [facility];  Service: Obstetrics;  Laterality: N/A;   LAPAROSCOPIC OVARIAN CYSTECTOMY Right 07/17/2015   Procedure: EXCISION OF ENDOMETRIOSIS;  Surgeon: Nadara Mustard, MD;  Location: [facility] ORS;  Service: Gynecology;  Laterality: Right;   LAPAROSCOPY N/A 07/17/2015   Procedure: LAPAROSCOPY OPERATIVE;  Surgeon: Nadara Mustard, MD;  Location: [facility] ORS;  Service: Gynecology;  Laterality: N/A;   Patient Active Problem List   Diagnosis Date Noted   VBAC (vaginal birth after Cesarean) 08/12/2022   Chronic hypertension affecting pregnancy 08/10/2022   H/O severe pre-eclampsia 04/17/2022   H/O emergency cesarean section 09/20/2020   Rh negative state in antepartum period 06/07/2020      REFERRING PROVIDER: Harper,Charles MD  REFERRING DIAG: back pain following epidural  Rationale for Evaluation and Treatment: Rehabilitation  THERAPY DIAG:  Back pain;weakness  ONSET DATE: April 2024 exacerbation of chronic issue  SUBJECTIVE:                                                                                                                                                                                           SUBJECTIVE STATEMENT: ***  PERTINENT HISTORY:  Has seen a chiro in past helped but short term only Has 41 year old son  PAIN:  PAIN:  11/26/22: Are you having pain?  Yes NPRS scale: 5/10 Pain location: mild in lumbar area but more in mid back today Aggravating factors: turning/twisting; lifting baby in/out of the crib; bending over Relieving factors: crossed legs stretch;  hot shower  PRECAUTIONS: None  WEIGHT BEARING RESTRICTIONS: No  FALLS:  Has patient fallen in last 6 months? 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   OBJECTIVE:    PATIENT SURVEYS:  Modified Oswestry 28%  11/26/22: (answers based on patient's subjective comments): 12/50 (mild disability) 24%   COGNITION: Overall cognitive status: Within functional limits for tasks assessed     SENSATION: WFL   POSTURE: rounded shoulders and forward head   LUMBAR ROM:   AROM eval 11/26/22  Flexion 25 % limitation  25% and primarily due to thoracic pain  Extension 25% limitation  WFL  Right lateral flexion 25 % limitation  WFL  Left lateral flexion 25 % limitation  WFL  Right rotation  WFL  Left rotation  WFL   (Blank rows = not tested)  TRUNK STRENGTH:  Decreased activation of transverse abdominus muscles; abdominals 4-/5; decreased activation of lumbar multifidi; trunk extensors 4-/5 11/26/22: Transverse abdominus: 4/5,  lumbar multifidi 4/5  LOWER EXTREMITY ROM:   WFLs  LOWER EXTREMITY MMT:    MMT Right eval Right 11/26/22 Left eval Left 11/26/22  Hip flexion 5 5 5 5   Hip extension 4- 4 4- 4  Hip abduction 4- 4 4- 4  Hip adduction      Hip internal rotation      Hip external rotation 4- 4 4- 4  Knee flexion      Knee extension 5 5 5 5   Ankle dorsiflexion      Ankle plantarflexion      Ankle inversion      Ankle eversion       (Blank rows = not tested)    FUNCTIONAL TESTS:  Able to SLS 10 sec with some compensatory trunk  lean and rotation  11/26/22: continued compensatory trunk lean but decreased rotation  GAIT: Comments: WFLs  TODAY'S TREATMENT:                                                                                                                              DATE:  11/26/22 Nustep x 5 min level 5 (instructed patient to increase her speed a little for some resistance) Open books x 5 B and prolonged hold at end Throracic extension over foam roller multiple levels; also did in chair Self MFR to thoracic paraspinals and R infraspinatus (for CT symptoms) Manual: Skilled palpation and monitoring of soft tissues during DN STM to R gluteals and thoracic paraspinals; PA mobs to lower thoracic spine Trigger Point Dry-Needling  Treatment instructions: Expect mild to moderate muscle soreness. S/S of pneumothorax if dry needled over a lung field, and to seek immediate medical attention should they occur. Patient verbalized understanding of these instructions and education. Patient Consent Given: Yes Education handout provided: Previously provided Muscles treated: R gluteals, lower thoracic multifidi and left paraspinals Electrical stimulation performed: No Parameters: N/A Treatment response/outcome: Twitch Response Elicited and Palpable Increase in Muscle Length  11/26/22 Nustep x 5 min level 5 (instructed patient to increase her speed a little for some resistance) Open books x 5 B and prolonged hold at end Throracic extension over foam roller multiple levels; also did in chair Self MFR to thoracic paraspinals and  R infraspinatus (for CT symptoms) Manual: Skilled palpation and monitoring of soft tissues during DN STM to R gluteals and thoracic paraspinals; PA mobs to lower thoracic spine Trigger Point Dry-Needling  Treatment instructions: Expect mild to moderate muscle soreness. S/S of pneumothorax if dry needled over a lung field, and to seek immediate medical attention should they occur. Patient  verbalized understanding of these instructions and education. Patient Consent Given: Yes Education handout provided: Previously provided Muscles treated: R gluteals, lower thoracic multifidi and left paraspinals Electrical stimulation performed: No Parameters: N/A Treatment response/outcome: Twitch Response Elicited and Palpable Increase in Muscle Length   DATE: 11/26/22 Nustep x 5 min level 5 (instructed patient to increase her speed a little for some resistance) Lateral band walks x 3 laps of 20 feet with blue loop Seated clam x 20 with blue loop Supine clam x 20 with blue loop  Bridging x 20 Side lying clam with blue loop x 20 each side (gave patient option of 2 x 10) Supine SLR 2 x 10 each LE Side lying hip abduction 2 x 10 each LE Prone hip extension 2 x 10 each LE  DATE: 10/28/22 Pt seen for aquatic therapy today.  Treatment took place in water 3.5-4.75 ft in depth at the [facility] pool. Temp of water was 91.  Pt entered/exited the pool via stairs using alternating pattern with hand rail. *unsupported forward, back and side stepping in 4.2 and 3.6 *L stretch; gaining little stretch in LB; completed at ladder (improved stretch) *Seated on noodle: ant/post pelvic tilting; hip hiking; rotation *TrA sets: using solid noodle wide stance then staggered x5 ea *decompression using yellow noodle under arms *Cycling on noodle *warrior I leading R/L 2x 5 *recovery in crouched position *hip hinges x 10.  Verbal and tactile cues for execution. Glut isometric at end range x 10 *Seated on bench: hip add/abd x 10; flutter kicking seated on edge of bench ue extended (increase core engagement) 2x20 *Walking forward and back between exercises for recovery. Pt requires the buoyancy and hydrostatic pressure of water for support, and to offload joints by unweighting joint load by at least 50 % in navel deep water and by at least 75-80% in chest to neck deep water.  Viscosity of the water  is needed for resistance of strengthening. Water current perturbations provides challenge to standing balance requiring increased core activation.  11/04/22  Nustep x 10 min level 3 (PT present to discuss goals and progress) Standing hamstring and hip flexor quad stretches 3 x 30 sec both side Mechanical lumbar traction x 15 min (65 lb max/ 50 lb min, 6 step, hold time 60 sec, rest 10 sec, speed 100%)  10/21/22  Nustep x 10 min level 3 (PT present to discuss goals and progress) Reviewed various braces and supports including proper fitting bra that would be an appropriate option but also discussed breast reduction.  (Patient states she has already discussed this with her doctor) Mechanical lumbar traction x 15 min (50 lb max/ 30 lb min, 6 step, hold time 60 sec, rest 10 sec, speed 50%)  PATIENT EDUCATION:  Education details: Educated patient on anatomy and physiology of current symptoms, prognosis, plan of care as well as initial self care strategies to promote recovery  Person educated: Patient Education method: Explanation Education comprehension: verbalized understanding  HOME EXERCISE PROGRAM: Access Code: [email or web address] URL: [email or web address] Date: 12/04/2022 Prepared by: Raynelle Fanning  Exercises - Sidelying Diaphragmatic Breathing  - 1 x daily - 7 x weekly -  1 sets - 3-5 reps - 30-45s holds - Supine Thoracic Mobilization Foam Roll Horizontal with Arm Stretch  - 1 x daily - 7 x weekly - 1 sets - 3-5 reps - 30-45s holds - Supine Single Knee to Chest Stretch  - 1 x daily - 7 x weekly - 1 sets - 3-5 reps - 30-45s holds - Cat Cow  - 1 x daily - 7 x weekly - 1 sets - 3-5 reps - 30-45s holds - Sitting Wall Angels  - 1 x daily - 7 x weekly - 1 sets - 3-5 reps - 30-45s holds - Standing Hamstring Stretch on Chair  - 1 x daily - 7 x weekly - 1 sets - 3 reps - 20 sec hold - Standing Quad Stretch with Table and Chair Support  - 1 x daily - 7 x weekly - 1 sets - 3 reps - 20 sec hold - Supine  Lower Trunk Rotation  - 1 x daily - 7 x weekly - 1 sets - 20 reps - Supine Posterior Pelvic Tilt  - 1 x daily - 7 x weekly - 1 sets - 20 reps - Supine 90/90 Alternating Heel Touches with Posterior Pelvic Tilt  - 1 x daily - 7 x weekly - 1 sets - 20 reps - Sidelying Thoracic Rotation with Open Book  - 1 x daily - 7 x weekly - 2 sets - 5 reps - Standing Glute Med Mobilization with Small Ball on Wall  - 1 x daily - 3-4 x weekly - 1 sets - 10 reps  ASSESSMENT:  CLINICAL IMPRESSION: ***   OBJECTIVE IMPAIRMENTS: decreased activity tolerance, decreased ROM, decreased strength, impaired perceived functional ability, and pain.   ACTIVITY LIMITATIONS: lifting, bending, sitting, standing, locomotion level, and caring for others  PARTICIPATION LIMITATIONS: meal prep, cleaning, laundry, community activity, and occupation  PERSONAL FACTORS: Time since onset of injury/illness/exacerbation are also affecting patient's functional outcome.   REHAB POTENTIAL: Good  CLINICAL DECISION MAKING: Stable/uncomplicated  EVALUATION COMPLEXITY: Low   GOALS: Goals reviewed with patient? Yes  SHORT TERM GOALS: Target date: 10/28/2022    The patient will demonstrate knowledge of basic self care strategies and exercises to promote healing  Baseline: Goal status: MET  2.  The patient will report a 35% improvement in pain levels with functional activities which are currently difficult including bending and lifting the baby in/out of the crib Baseline:  Goal status: MET 11/26/22  3. Lumbar ROM WFLS in all planes needed for taking care of her children MET 10/21/22    LONG TERM GOALS: Target date: 11/25/2022 extended to 01/20/23    The patient will be independent in a safe self progression of a home exercise program to promote further recovery of function  Baseline:  Goal status: IN PROGRESS  2.  The patient will have improved trunk flexor and extensor muscle strength to at least 4+/5 needed for lifting  her infant and for return to work in a daycare Baseline:  Goal status: IN PROGRESS  3.  The patient will have improved hip strength to at least 4+/5 needed for standing, walking longer distances and descending stairs at home and in the community  Baseline:  Goal status: IN PROGRESS  4.  Modified Oswestry Index improved to 18% Baseline: Improved by 4% since initial eval Goal status: IN PROGRESS  5. The patient will report a 70% improvement in pain levels with functional activities which are currently difficult including lifting/bending  and twisting tasks  Goal status: In progress  PLAN:  PT FREQUENCY: continue 1x/week  PT DURATION: for an additional 8 weeks  PLANNED INTERVENTIONS: Therapeutic exercises, Therapeutic activity, Neuromuscular re-education, Patient/Family education, Self Care, Joint mobilization, Dry Needling, Electrical stimulation, Spinal mobilization, Cryotherapy, Moist heat, Taping, Traction, Ultrasound, Manual therapy, and Re-evaluation.  PLAN FOR NEXT SESSION:  Continue postural and core strengthening,  LE flexibility and pain control.  Pelvic floor focused strengthening.  Solon Palm, PT  12/10/22 9:12 PM [facility] [address] [place], [place] [address] Phone # [phone] Fax [phone]

## 2022-12-11 ENCOUNTER — Ambulatory Visit: Payer: BC Managed Care – PPO

## 2022-12-16 ENCOUNTER — Telehealth: Payer: Self-pay

## 2022-12-16 ENCOUNTER — Ambulatory Visit: Payer: BC Managed Care – PPO

## 2022-12-16 NOTE — Telephone Encounter (Signed)
Patient had appt today at 4:15 pm.  She did not show.  PT placed call to patient.  No answer but left voice mail.  Informed patient she had an appointment today and explained that we have no show/cancellation policy. Requested she call us to let us know if she will be returning.  Otherwise we will DC.

## 2022-12-19 ENCOUNTER — Ambulatory Visit: Payer: BC Managed Care – PPO | Admitting: Rehabilitative and Restorative Service Providers"

## 2022-12-19 ENCOUNTER — Encounter: Payer: Self-pay | Admitting: Rehabilitative and Restorative Service Providers"

## 2022-12-19 DIAGNOSIS — M5459 Other low back pain: Secondary | ICD-10-CM

## 2022-12-19 DIAGNOSIS — R252 Cramp and spasm: Secondary | ICD-10-CM

## 2022-12-19 DIAGNOSIS — R262 Difficulty in walking, not elsewhere classified: Secondary | ICD-10-CM

## 2022-12-19 DIAGNOSIS — M25552 Pain in left hip: Secondary | ICD-10-CM

## 2022-12-19 DIAGNOSIS — M6281 Muscle weakness (generalized): Secondary | ICD-10-CM

## 2022-12-19 DIAGNOSIS — M25551 Pain in right hip: Secondary | ICD-10-CM

## 2022-12-19 NOTE — Therapy (Signed)
OUTPATIENT PHYSICAL THERAPY THORACOLUMBAR TREATMENT NOTE   Patient Name: Kelly Lane MRN: [id number] DOB:November 06, 1995, 27 y.o., female Today's Date: 09/30/2022  END OF SESSION:  PT End of Session - 12/19/22 0854     Visit Number 10    Date for PT Re-Evaluation 01/21/23    Authorization Type medicaid [employer] (secondary)    PT Start Time [phone]   Pt arrived late for visit   PT Stop Time 0932    PT Time Calculation (min) 40 min    Activity Tolerance Patient tolerated treatment well    Behavior During Therapy [facility] for tasks assessed/performed              Past Medical History:  Diagnosis Date   Anxiety    Endometriosis 07/2015   GERD (gastroesophageal reflux disease)    NO MEDS   Ovarian cyst 07/2015   Past Surgical History:  Procedure Laterality Date   BREAST BIOPSY Left 11/12/2022   [place] LT BREAST BX W LOC DEV 1ST LESION IMG BX SPEC US GUIDE 11/12/2022 [facility]   CESAREAN SECTION N/A 09/20/2020   Procedure: CESAREAN SECTION;  Surgeon: Tereso Newcomer, MD;  Location: [facility] ORS;  Service: Obstetrics;  Laterality: N/A;   LAPAROSCOPIC OVARIAN CYSTECTOMY Right 07/17/2015   Procedure: EXCISION OF ENDOMETRIOSIS;  Surgeon: Nadara Mustard, MD;  Location: [facility] ORS;  Service: Gynecology;  Laterality: Right;   LAPAROSCOPY N/A 07/17/2015   Procedure: LAPAROSCOPY OPERATIVE;  Surgeon: Nadara Mustard, MD;  Location: [facility] ORS;  Service: Gynecology;  Laterality: N/A;   Patient Active Problem List   Diagnosis Date Noted   VBAC (vaginal birth after Cesarean) 08/12/2022   Chronic hypertension affecting pregnancy 08/10/2022   H/O severe pre-eclampsia 04/17/2022   H/O emergency cesarean section 09/20/2020   Rh negative state in antepartum period 06/07/2020      REFERRING PROVIDER: Harper,Charles MD  REFERRING DIAG: back pain following epidural  Rationale for Evaluation and Treatment: Rehabilitation  THERAPY DIAG:  Back pain;weakness  ONSET DATE: April 2024  exacerbation of chronic issue  SUBJECTIVE:                                                                                                                                                                                           SUBJECTIVE STATEMENT: Pt reports that she is still having back pain.  Pt continues to report right wrist pain of 5/10.  Also reports back at 5/10.  PERTINENT HISTORY:  Has seen a chiro in past helped but short term only Has 62 year old son  PAIN:  PAIN:  11/26/22: Are you having pain? Yes NPRS  scale: 5/10 Pain location: mild in lumbar area but more in mid back today Aggravating factors: turning/twisting; lifting baby in/out of the crib; bending over Relieving factors: crossed legs stretch;  hot shower  PRECAUTIONS: None  WEIGHT BEARING RESTRICTIONS: No  FALLS:  Has patient fallen in last 6 months? 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   OBJECTIVE:    PATIENT SURVEYS:  Modified Oswestry 28%   11/26/22: (answers based on patient's subjective comments): 12/50 (mild disability) 24%   COGNITION: Overall cognitive status: Within functional limits for tasks assessed     SENSATION: WFL   POSTURE: rounded shoulders and forward head   LUMBAR ROM:   AROM eval 11/26/22  Flexion 25 % limitation  25% and primarily due to thoracic pain  Extension 25% limitation  WFL  Right lateral flexion 25 % limitation  WFL  Left lateral flexion 25 % limitation  WFL  Right rotation  WFL  Left rotation  WFL   (Blank rows = not tested)  TRUNK STRENGTH:  Decreased activation of transverse abdominus muscles; abdominals 4-/5; decreased activation of lumbar multifidi; trunk extensors 4-/5 11/26/22: Transverse abdominus: 4/5,  lumbar  multifidi 4/5  LOWER EXTREMITY ROM:   WFLs  LOWER EXTREMITY MMT:    MMT Right eval Right 11/26/22 Left eval Left 11/26/22  Hip flexion 5 5 5 5   Hip extension 4- 4 4- 4  Hip abduction 4- 4 4- 4  Hip adduction      Hip internal rotation      Hip external rotation 4- 4 4- 4  Knee flexion      Knee extension 5 5 5 5   Ankle dorsiflexion      Ankle plantarflexion      Ankle inversion      Ankle eversion       (Blank rows = not tested)    FUNCTIONAL TESTS:  Able to SLS 10 sec with some compensatory trunk lean and rotation  11/26/22: continued compensatory trunk lean but decreased rotation  GAIT: Comments: WFLs  TODAY'S TREATMENT:                                                                                                                               DATE: 12/19/2022 Nustep level 3 x6 min with PT present to discuss status Open books x10 bilat Throracic extension over white foam roller multiple levels Supine bridge with hip abduction with blue loop 2x10 Supine posterior pelvic tilt with 90/90 heel tap 3x5 Supine in 90/90 table top with cross body TA activation pressing through contralateral thigh 2x10 bilat Trigger Point Dry-Needling  Treatment instructions: Expect mild to moderate muscle soreness. S/S of pneumothorax if dry needled over a lung  field, and to seek immediate medical attention should they occur. Patient verbalized understanding of these instructions and education. Patient Consent Given: Yes Education handout provided: Previously provided Muscles treated: bilateral lumbar and thoracic multifid Electrical stimulation performed: No Parameters: N/A Treatment response/outcome: Utilized skilled palpation to identify bony landmarks and trigger points.  Able to illicit twitch response and muscle elongation.  Soft tissue mobilization following to further promote tissue elongation.     DATE: 11/26/22 Nustep x 5 min level 5 (instructed patient to increase her speed a  little for some resistance) Open books x 5 B and prolonged hold at end Throracic extension over foam roller multiple levels; also did in chair Self MFR to thoracic paraspinals and R infraspinatus (for CT symptoms) Manual: Skilled palpation and monitoring of soft tissues during DN STM to R gluteals and thoracic paraspinals; PA mobs to lower thoracic spine Trigger Point Dry-Needling  Treatment instructions: Expect mild to moderate muscle soreness. S/S of pneumothorax if dry needled over a lung field, and to seek immediate medical attention should they occur. Patient verbalized understanding of these instructions and education. Patient Consent Given: Yes Education handout provided: Previously provided Muscles treated: R gluteals, lower thoracic multifidi and left paraspinals Electrical stimulation performed: No Parameters: N/A Treatment response/outcome: Twitch Response Elicited and Palpable Increase in Muscle Length   DATE: 11/26/22 Nustep x 5 min level 5 (instructed patient to increase her speed a little for some resistance) Lateral band walks x 3 laps of 20 feet with blue loop Seated clam x 20 with blue loop Supine clam x 20 with blue loop  Bridging x 20 Side lying clam with blue loop x 20 each side (gave patient option of 2 x 10) Supine SLR 2 x 10 each LE Side lying hip abduction 2 x 10 each LE Prone hip extension 2 x 10 each LE  DATE: 10/28/22 Pt seen for aquatic therapy today.  Treatment took place in water 3.5-4.75 ft in depth at the [facility] pool. Temp of water was 91.  Pt entered/exited the pool via stairs using alternating pattern with hand rail. *unsupported forward, back and side stepping in 4.2 and 3.6 *L stretch; gaining little stretch in LB; completed at ladder (improved stretch) *Seated on noodle: ant/post pelvic tilting; hip hiking; rotation *TrA sets: using solid noodle wide stance then staggered x5 ea *decompression using yellow noodle under arms *Cycling  on noodle *warrior I leading R/L 2x 5 *recovery in crouched position *hip hinges x 10.  Verbal and tactile cues for execution. Glut isometric at end range x 10 *Seated on bench: hip add/abd x 10; flutter kicking seated on edge of bench ue extended (increase core engagement) 2x20 *Walking forward and back between exercises for recovery. Pt requires the buoyancy and hydrostatic pressure of water for support, and to offload joints by unweighting joint load by at least 50 % in navel deep water and by at least 75-80% in chest to neck deep water.  Viscosity of the water is needed for resistance of strengthening. Water current perturbations provides challenge to standing balance requiring increased core activation.  11/04/22  Nustep x 10 min level 3 (PT present to discuss goals and progress) Standing hamstring and hip flexor quad stretches 3 x 30 sec both side Mechanical lumbar traction x 15 min (65 lb max/ 50 lb min, 6 step, hold time 60 sec, rest 10 sec, speed 100%)  10/21/22  Nustep x 10 min level 3 (PT present to discuss goals and progress) Reviewed various braces  and supports including proper fitting bra that would be an appropriate option but also discussed breast reduction.  (Patient states she has already discussed this with her doctor) Mechanical lumbar traction x 15 min (50 lb max/ 30 lb min, 6 step, hold time 60 sec, rest 10 sec, speed 50%)  PATIENT EDUCATION:  Education details: Educated patient on anatomy and physiology of current symptoms, prognosis, plan of care as well as initial self care strategies to promote recovery  Person educated: Patient Education method: Explanation Education comprehension: verbalized understanding  HOME EXERCISE PROGRAM: Access Code: [email or web address] URL: [email or web address] Date: 12/04/2022 Prepared by: Raynelle Fanning  Exercises - Sidelying Diaphragmatic Breathing  - 1 x daily - 7 x weekly - 1 sets - 3-5 reps - 30-45s holds - Supine Thoracic  Mobilization Foam Roll Horizontal with Arm Stretch  - 1 x daily - 7 x weekly - 1 sets - 3-5 reps - 30-45s holds - Supine Single Knee to Chest Stretch  - 1 x daily - 7 x weekly - 1 sets - 3-5 reps - 30-45s holds - Cat Cow  - 1 x daily - 7 x weekly - 1 sets - 3-5 reps - 30-45s holds - Sitting Wall Angels  - 1 x daily - 7 x weekly - 1 sets - 3-5 reps - 30-45s holds - Standing Hamstring Stretch on Chair  - 1 x daily - 7 x weekly - 1 sets - 3 reps - 20 sec hold - Standing Quad Stretch with Table and Chair Support  - 1 x daily - 7 x weekly - 1 sets - 3 reps - 20 sec hold - Supine Lower Trunk Rotation  - 1 x daily - 7 x weekly - 1 sets - 20 reps - Supine Posterior Pelvic Tilt  - 1 x daily - 7 x weekly - 1 sets - 20 reps - Supine 90/90 Alternating Heel Touches with Posterior Pelvic Tilt  - 1 x daily - 7 x weekly - 1 sets - 20 reps - Sidelying Thoracic Rotation with Open Book  - 1 x daily - 7 x weekly - 2 sets - 5 reps - Standing Glute Med Mobilization with Small Ball on Wall  - 1 x daily - 3-4 x weekly - 1 sets - 10 reps  ASSESSMENT:  CLINICAL IMPRESSION: Florean presents to skilled PT reporting that she feels like she did get some relief with dry needling last session.  Patient continues to report increased pain, but does state some relief of pain with thoracic extension over white foam roll.  Patient with strong twitch response to both right and left side of thoracic and lumbar paraspinals.  Following dry needling, continued with soft tissue mobilization to further promote tissue elongation and decreased pain.  Patient did report feeling decreased pain by end of session and that she felt that some of her muscles did relax.   OBJECTIVE IMPAIRMENTS: decreased activity tolerance, decreased ROM, decreased strength, impaired perceived functional ability, and pain.   ACTIVITY LIMITATIONS: lifting, bending, sitting, standing, locomotion level, and caring for others  PARTICIPATION LIMITATIONS: meal prep,  cleaning, laundry, community activity, and occupation  PERSONAL FACTORS: Time since onset of injury/illness/exacerbation are also affecting patient's functional outcome.   REHAB POTENTIAL: Good  CLINICAL DECISION MAKING: Stable/uncomplicated  EVALUATION COMPLEXITY: Low   GOALS: Goals reviewed with patient? Yes  SHORT TERM GOALS: Target date: 10/28/2022    The patient will demonstrate knowledge of basic self care strategies and exercises to promote healing  Baseline: Goal  status: MET  2.  The patient will report a 35% improvement in pain levels with functional activities which are currently difficult including bending and lifting the baby in/out of the crib Baseline:  Goal status: MET 11/26/22  3. Lumbar ROM WFLS in all planes needed for taking care of her children MET 10/21/22    LONG TERM GOALS: Target date: 11/25/2022 extended to 01/20/23    The patient will be independent in a safe self progression of a home exercise program to promote further recovery of function  Baseline:  Goal status: IN PROGRESS  2.  The patient will have improved trunk flexor and extensor muscle strength to at least 4+/5 needed for lifting her infant and for return to work in a daycare Baseline:  Goal status: IN PROGRESS  3.  The patient will have improved hip strength to at least 4+/5 needed for standing, walking longer distances and descending stairs at home and in the community  Baseline:  Goal status: IN PROGRESS  4.  Modified Oswestry Index improved to 18% Baseline: Improved by 4% since initial eval Goal status: IN PROGRESS  5. The patient will report a 70% improvement in pain levels with functional activities which are currently difficult including lifting/bending  and twisting tasks   Goal status: In progress  PLAN:  PT FREQUENCY: continue 1x/week  PT DURATION: for an additional 8 weeks  PLANNED INTERVENTIONS: Therapeutic exercises, Therapeutic activity, Neuromuscular  re-education, Patient/Family education, Self Care, Joint mobilization, Dry Needling, Electrical stimulation, Spinal mobilization, Cryotherapy, Moist heat, Taping, Traction, Ultrasound, Manual therapy, and Re-evaluation.  PLAN FOR NEXT SESSION:  Continue postural and core strengthening,  LE flexibility and pain control.  Dry needling/manual therapy as indicated    Reather Laurence, PT, DPT 12/19/22, 10:39 AM  [facility] [address] [place], [place] [address] Phone # [phone] Fax [phone]

## 2022-12-30 ENCOUNTER — Ambulatory Visit: Payer: BC Managed Care – PPO

## 2022-12-30 DIAGNOSIS — R252 Cramp and spasm: Secondary | ICD-10-CM

## 2022-12-30 DIAGNOSIS — M25552 Pain in left hip: Secondary | ICD-10-CM | POA: Diagnosis present

## 2022-12-30 DIAGNOSIS — M5459 Other low back pain: Secondary | ICD-10-CM

## 2022-12-30 DIAGNOSIS — R262 Difficulty in walking, not elsewhere classified: Secondary | ICD-10-CM

## 2022-12-30 DIAGNOSIS — M25551 Pain in right hip: Secondary | ICD-10-CM

## 2022-12-30 DIAGNOSIS — M6281 Muscle weakness (generalized): Secondary | ICD-10-CM | POA: Diagnosis present

## 2022-12-30 DIAGNOSIS — R293 Abnormal posture: Secondary | ICD-10-CM | POA: Diagnosis present

## 2022-12-30 NOTE — Therapy (Signed)
OUTPATIENT PHYSICAL THERAPY THORACOLUMBAR TREATMENT NOTE   Patient Name: Kelly Lane MRN: [id number] DOB:10-Jan-1996, 27 y.o., female Today's Date: 09/30/2022  END OF SESSION:  PT End of Session - 12/30/22 0903     Visit Number 11    Date for PT Re-Evaluation 01/21/23    Authorization Type medicaid [employer] (secondary)    PT Start Time [phone]    PT Stop Time 0932    PT Time Calculation (min) 44 min    Activity Tolerance Patient tolerated treatment well    Behavior During Therapy [facility] for tasks assessed/performed              Past Medical History:  Diagnosis Date   Anxiety    Endometriosis 07/2015   GERD (gastroesophageal reflux disease)    NO MEDS   Ovarian cyst 07/2015   Past Surgical History:  Procedure Laterality Date   BREAST BIOPSY Left 11/12/2022   [place] LT BREAST BX W LOC DEV 1ST LESION IMG BX SPEC US GUIDE 11/12/2022 [facility]   CESAREAN SECTION N/A 09/20/2020   Procedure: CESAREAN SECTION;  Surgeon: Tereso Newcomer, MD;  Location: [facility] LD ORS;  Service: Obstetrics;  Laterality: N/A;   LAPAROSCOPIC OVARIAN CYSTECTOMY Right 07/17/2015   Procedure: EXCISION OF ENDOMETRIOSIS;  Surgeon: Nadara Mustard, MD;  Location: [facility] ORS;  Service: Gynecology;  Laterality: Right;   LAPAROSCOPY N/A 07/17/2015   Procedure: LAPAROSCOPY OPERATIVE;  Surgeon: Nadara Mustard, MD;  Location: [facility] ORS;  Service: Gynecology;  Laterality: N/A;   Patient Active Problem List   Diagnosis Date Noted   VBAC (vaginal birth after Cesarean) 08/12/2022   Chronic hypertension affecting pregnancy 08/10/2022   H/O severe pre-eclampsia 04/17/2022   H/O emergency cesarean section 09/20/2020   Rh negative state in antepartum period 06/07/2020      REFERRING PROVIDER: Harper,Charles MD  REFERRING DIAG: back pain following epidural  Rationale for Evaluation and Treatment: Rehabilitation  THERAPY DIAG:  Back pain;weakness  ONSET DATE: April 2024 exacerbation of chronic  issue  SUBJECTIVE:                                                                                                                                                                                           SUBJECTIVE STATEMENT: Pt reports that her back pain is improving but reports pain at same level.  "I feel like once I am done breast feeding, my back will feel better." Pt continues to report right wrist pain of 7/10.  Also reports back at 5/10. "I am also feeling unbalanced and clumsy"  PERTINENT HISTORY:  Has seen a chiro in past helped but  short term only Has 11 year old son  PAIN:  PAIN:  12/30/22: Are you having pain? Yes NPRS scale: back 5/10, wrist 7/10 Pain location: wrist and back Aggravating factors: turning/twisting; lifting baby in/out of the crib; bending over Relieving factors: crossed legs stretch;  hot shower  PRECAUTIONS: None  WEIGHT BEARING RESTRICTIONS: No  FALLS:  Has patient fallen in last 6 months? 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   OBJECTIVE:    PATIENT SURVEYS:  Modified Oswestry 28%   11/26/22: (answers based on patient's subjective comments): 12/50 (mild disability) 24%   COGNITION: Overall cognitive status: Within functional limits for tasks assessed     SENSATION: WFL   POSTURE: rounded shoulders and forward head   LUMBAR ROM:   AROM eval 11/26/22  Flexion 25 % limitation  25% and primarily due to thoracic pain  Extension 25% limitation  WFL  Right lateral flexion 25 % limitation  WFL  Left lateral flexion 25 % limitation  WFL  Right rotation  WFL  Left rotation  WFL   (Blank rows = not tested)  TRUNK STRENGTH:  Decreased activation of transverse abdominus muscles; abdominals 4-/5;  decreased activation of lumbar multifidi; trunk extensors 4-/5 11/26/22: Transverse abdominus: 4/5,  lumbar multifidi 4/5  LOWER EXTREMITY ROM:   WFLs  LOWER EXTREMITY MMT:    MMT Right eval Right 11/26/22 Left eval Left 11/26/22  Hip flexion 5 5 5 5   Hip extension 4- 4 4- 4  Hip abduction 4- 4 4- 4  Hip adduction      Hip internal rotation      Hip external rotation 4- 4 4- 4  Knee flexion      Knee extension 5 5 5 5   Ankle dorsiflexion      Ankle plantarflexion      Ankle inversion      Ankle eversion       (Blank rows = not tested)    FUNCTIONAL TESTS:  Able to SLS 10 sec with some compensatory trunk lean and rotation  11/26/22: continued compensatory trunk lean but decreased rotation  GAIT: Comments: WFLs  TODAY'S TREATMENT:                                                                                                                              DATE: 12/19/2022 Nustep level 3 x6 min with PT present to discuss status Thoracic stretch over foam roller x 5 holding 10-20 sec Quadruped bird dog x 20 Patient requested thoracic stretch over foam roller again Supine on foam roller (along spine) tband horizontal abduction and shoulder ER with red band WITTY on physio ball with 1 lb x 20 each letter Thoracic extension with  hands behind head prone over physio ball  Open books x10 bilat Supine bridge with hip abduction with blue loop 2x10 Supine posterior pelvic tilt with 90/90 heel tap 3x5 Supine in 90/90 table top with cross body TA activation pressing through contralateral thigh 2x10 bilat  DATE: 12/19/2022 Nustep level 3 x6 min with PT present to discuss status Open books x10 bilat Throracic extension over white foam roller multiple levels Supine bridge with hip abduction with blue loop 2x10 Supine posterior pelvic tilt with 90/90 heel tap 3x5 Supine in 90/90 table top with cross body TA activation pressing through contralateral thigh 2x10 bilat Trigger Point  Dry-Needling  Treatment instructions: Expect mild to moderate muscle soreness. S/S of pneumothorax if dry needled over a lung field, and to seek immediate medical attention should they occur. Patient verbalized understanding of these instructions and education. Patient Consent Given: Yes Education handout provided: Previously provided Muscles treated: bilateral lumbar and thoracic multifid Electrical stimulation performed: No Parameters: N/A Treatment response/outcome: Utilized skilled palpation to identify bony landmarks and trigger points.  Able to illicit twitch response and muscle elongation.  Soft tissue mobilization following to further promote tissue elongation.     DATE: 11/26/22 Nustep x 5 min level 5 (instructed patient to increase her speed a little for some resistance) Open books x 5 B and prolonged hold at end Throracic extension over foam roller multiple levels; also did in chair Self MFR to thoracic paraspinals and R infraspinatus (for CT symptoms) Manual: Skilled palpation and monitoring of soft tissues during DN STM to R gluteals and thoracic paraspinals; PA mobs to lower thoracic spine Trigger Point Dry-Needling  Treatment instructions: Expect mild to moderate muscle soreness. S/S of pneumothorax if dry needled over a lung field, and to seek immediate medical attention should they occur. Patient verbalized understanding of these instructions and education. Patient Consent Given: Yes Education handout provided: Previously provided Muscles treated: R gluteals, lower thoracic multifidi and left paraspinals Electrical stimulation performed: No Parameters: N/A Treatment response/outcome: Twitch Response Elicited and Palpable Increase in Muscle Length   PATIENT EDUCATION:  Education details: Educated patient on anatomy and physiology of current symptoms, prognosis, plan of care as well as initial self care strategies to promote recovery  Person educated: Patient Education  method: Explanation Education comprehension: verbalized understanding  HOME EXERCISE PROGRAM: Access Code: [email or web address] URL: [email or web address] Date: 12/04/2022 Prepared by: Raynelle Fanning  Exercises - Sidelying Diaphragmatic Breathing  - 1 x daily - 7 x weekly - 1 sets - 3-5 reps - 30-45s holds - Supine Thoracic Mobilization Foam Roll Horizontal with Arm Stretch  - 1 x daily - 7 x weekly - 1 sets - 3-5 reps - 30-45s holds - Supine Single Knee to Chest Stretch  - 1 x daily - 7 x weekly - 1 sets - 3-5 reps - 30-45s holds - Cat Cow  - 1 x daily - 7 x weekly - 1 sets - 3-5 reps - 30-45s holds - Sitting Wall Angels  - 1 x daily - 7 x weekly - 1 sets - 3-5 reps - 30-45s holds - Standing Hamstring Stretch on Chair  - 1 x daily - 7 x weekly - 1 sets - 3 reps - 20 sec hold - Standing Quad Stretch with Table and Chair Support  - 1 x daily - 7 x weekly - 1 sets - 3 reps - 20 sec hold - Supine Lower Trunk Rotation  - 1 x daily - 7 x weekly - 1 sets - 20  reps - Supine Posterior Pelvic Tilt  - 1 x daily - 7 x weekly - 1 sets - 20 reps - Supine 90/90 Alternating Heel Touches with Posterior Pelvic Tilt  - 1 x daily - 7 x weekly - 1 sets - 20 reps - Sidelying Thoracic Rotation with Open Book  - 1 x daily - 7 x weekly - 2 sets - 5 reps - Standing Glute Med Mobilization with Small Ball on Wall  - 1 x daily - 3-4 x weekly - 1 sets - 10 reps  ASSESSMENT:  CLINICAL IMPRESSION: Malya is making slow but steady progress.  She was able to tolerate new shoulder and postural exercises today with resistance with moderate fatigue, needing rest breaks frequently.  She gets significant relief with thoracic extension over white foam roller.  She continues to have the wrist pain but this comes and goes.  Today, she mentions balance issues.  We will continue to focus on her primary issue and request new referral if the balance issues continue.  Her balance issues are most likely due to her body changes and weakness  post partum.  She would benefit from continued skilled PT for strengthening, stretching and pain control.  We discussed iontophoresis for her wrist.  We may do this next visit if on POC and ok with MD.     OBJECTIVE IMPAIRMENTS: decreased activity tolerance, decreased ROM, decreased strength, impaired perceived functional ability, and pain.   ACTIVITY LIMITATIONS: lifting, bending, sitting, standing, locomotion level, and caring for others  PARTICIPATION LIMITATIONS: meal prep, cleaning, laundry, community activity, and occupation  PERSONAL FACTORS: Time since onset of injury/illness/exacerbation are also affecting patient's functional outcome.   REHAB POTENTIAL: Good  CLINICAL DECISION MAKING: Stable/uncomplicated  EVALUATION COMPLEXITY: Low   GOALS: Goals reviewed with patient? Yes  SHORT TERM GOALS: Target date: 10/28/2022    The patient will demonstrate knowledge of basic self care strategies and exercises to promote healing  Baseline: Goal status: MET  2.  The patient will report a 35% improvement in pain levels with functional activities which are currently difficult including bending and lifting the baby in/out of the crib Baseline:  Goal status: MET 11/26/22  3. Lumbar ROM WFLS in all planes needed for taking care of her children MET 10/21/22    LONG TERM GOALS: Target date: 11/25/2022 extended to 01/20/23    The patient will be independent in a safe self progression of a home exercise program to promote further recovery of function  Baseline:  Goal status: IN PROGRESS  2.  The patient will have improved trunk flexor and extensor muscle strength to at least 4+/5 needed for lifting her infant and for return to work in a daycare Baseline:  Goal status: IN PROGRESS  3.  The patient will have improved hip strength to at least 4+/5 needed for standing, walking longer distances and descending stairs at home and in the community  Baseline:  Goal status: IN  PROGRESS  4.  Modified Oswestry Index improved to 18% Baseline: Improved by 4% since initial eval Goal status: IN PROGRESS  5. The patient will report a 70% improvement in pain levels with functional activities which are currently difficult including lifting/bending  and twisting tasks   Goal status: In progress  PLAN:  PT FREQUENCY: continue 1x/week  PT DURATION: for an additional 8 weeks  PLANNED INTERVENTIONS: Therapeutic exercises, Therapeutic activity, Neuromuscular re-education, Patient/Family education, Self Care, Joint mobilization, Dry Needling, Electrical stimulation, Spinal mobilization, Cryotherapy, Moist heat, Taping, Traction,  Ultrasound, Manual therapy, and Re-evaluation.  PLAN FOR NEXT SESSION:  Confirm ionto ok to do while breastfeeding.  Continue postural and core strengthening,  LE flexibility and pain control.  Dry needling/manual therapy as indicated    Kaleea Penner B. Jennalee Greaves, PT 12/30/22 9:33 AM [facility] [address] [place], [place] [address] Phone # [phone] Fax [phone]

## 2023-01-06 ENCOUNTER — Ambulatory Visit: Payer: Medicaid Other

## 2023-01-08 ENCOUNTER — Ambulatory Visit: Payer: Medicaid Other | Attending: Obstetrics

## 2023-01-08 DIAGNOSIS — M6281 Muscle weakness (generalized): Secondary | ICD-10-CM | POA: Insufficient documentation

## 2023-01-08 DIAGNOSIS — M25552 Pain in left hip: Secondary | ICD-10-CM | POA: Insufficient documentation

## 2023-01-08 DIAGNOSIS — M5459 Other low back pain: Secondary | ICD-10-CM | POA: Insufficient documentation

## 2023-01-08 DIAGNOSIS — R262 Difficulty in walking, not elsewhere classified: Secondary | ICD-10-CM | POA: Insufficient documentation

## 2023-01-08 DIAGNOSIS — R293 Abnormal posture: Secondary | ICD-10-CM | POA: Insufficient documentation

## 2023-01-08 DIAGNOSIS — M25551 Pain in right hip: Secondary | ICD-10-CM | POA: Insufficient documentation

## 2023-01-08 DIAGNOSIS — R252 Cramp and spasm: Secondary | ICD-10-CM | POA: Diagnosis present

## 2023-01-08 NOTE — Therapy (Addendum)
 OUTPATIENT PHYSICAL THERAPY THORACOLUMBAR TREATMENT NOTE PHYSICAL THERAPY DISCHARGE SUMMARY  Visits from Start of Care: 12  Current functional level related to goals / functional outcomes: See below   Remaining deficits: See below   Education / Equipment: See below   Patient agrees to discharge. Patient goals were partially met. Patient is being discharged due to not returning since the last visit.    Patient Name: Kelly Lane MRN: [id number] DOB:1995-07-12, 27 y.o., female Today's Date: 09/30/2022  END OF SESSION:  PT End of Session - 01/08/23 0813     Visit Number 12    Number of Visits 30    Date for PT Re-Evaluation 01/21/23    Authorization Type medicaid [employer] (secondary)    PT Start Time 0809    PT Stop Time 0845    PT Time Calculation (min) 36 min    Activity Tolerance Patient tolerated treatment well    Behavior During Therapy [facility] for tasks assessed/performed              Past Medical History:  Diagnosis Date   Anxiety    Endometriosis 07/2015   GERD (gastroesophageal reflux disease)    NO MEDS   Ovarian cyst 07/2015   Past Surgical History:  Procedure Laterality Date   BREAST BIOPSY Left 11/12/2022   US  LT BREAST BX W LOC DEV 1ST LESION IMG BX SPEC US  GUIDE 11/12/2022 [facility] MAMMOGRAPHY   CESAREAN SECTION N/A 09/20/2020   Procedure: CESAREAN SECTION;  Surgeon: Herchel Gloris LABOR, MD;  Location: [facility] LD ORS;  Service: Obstetrics;  Laterality: N/A;   LAPAROSCOPIC OVARIAN CYSTECTOMY Right 07/17/2015   Procedure: EXCISION OF ENDOMETRIOSIS;  Surgeon: Lamar SHAUNNA Lesches, MD;  Location: [facility] ORS;  Service: Gynecology;  Laterality: Right;   LAPAROSCOPY N/A 07/17/2015   Procedure: LAPAROSCOPY OPERATIVE;  Surgeon: Lamar SHAUNNA Lesches, MD;  Location: [facility] ORS;  Service: Gynecology;  Laterality: N/A;   Patient Active Problem List   Diagnosis Date Noted   VBAC (vaginal birth after Cesarean) 08/12/2022   Chronic hypertension affecting pregnancy 08/10/2022   H/O  severe pre-eclampsia 04/17/2022   H/O emergency cesarean section 09/20/2020   Rh negative state in antepartum period 06/07/2020      REFERRING PROVIDER: Harper,Charles MD  REFERRING DIAG: back pain following epidural  Rationale for Evaluation and Treatment: Rehabilitation  THERAPY DIAG:  Back pain;weakness  ONSET DATE: April 2024 exacerbation of chronic issue  SUBJECTIVE:  SUBJECTIVE STATEMENT: Pt reports continued right wrist pain but has not been able to see her primary doctor because it is in [place].  She also states she rolled her left ankle and it is hurting.  My back is ok but it still hurts some too.  She rates her wrist pain at 8/10, her ankle pain at 7/10 and continued back pain at 6/10  PERTINENT HISTORY:  Has seen a chiro in past helped but short term only Has 28 year old son  PAIN:  PAIN:  01/08/23: Are you having pain? Yes NPRS scale: back 6/10, wrist 8/10 and now ankle 7/10 Pain location: wrist and back Aggravating factors: turning/twisting; lifting baby in/out of the crib; bending over Relieving factors: crossed legs stretch;  hot shower  PRECAUTIONS: None  WEIGHT BEARING RESTRICTIONS: No  FALLS:  Has patient fallen in last 6 months? 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   OBJECTIVE:    PATIENT SURVEYS:  Modified Oswestry 28%   11/26/22: (answers based on patient's subjective comments): 12/50 (mild disability) 24%   COGNITION: Overall cognitive status: Within functional limits for tasks  assessed     SENSATION: WFL   POSTURE: rounded shoulders and forward head   LUMBAR ROM:   AROM eval 11/26/22  Flexion 25 % limitation  25% and primarily due to thoracic pain  Extension 25% limitation  WFL  Right lateral flexion 25 % limitation  WFL  Left lateral flexion 25 % limitation  WFL  Right rotation  WFL  Left rotation  WFL   (Blank rows = not tested)  TRUNK STRENGTH:  Decreased activation of transverse abdominus muscles; abdominals 4-/5; decreased activation of lumbar multifidi; trunk extensors 4-/5 11/26/22: Transverse abdominus: 4/5,  lumbar multifidi 4/5  LOWER EXTREMITY ROM:   WFLs  LOWER EXTREMITY MMT:    MMT Right eval Right 11/26/22 Left eval Left 11/26/22  Hip flexion 5 5 5 5   Hip extension 4- 4 4- 4  Hip abduction 4- 4 4- 4  Hip adduction      Hip internal rotation      Hip external rotation 4- 4 4- 4  Knee flexion      Knee extension 5 5 5 5   Ankle dorsiflexion      Ankle plantarflexion      Ankle inversion      Ankle eversion       (Blank rows = not tested)    FUNCTIONAL TESTS:  Able to SLS 10 sec with some compensatory trunk lean and rotation  11/26/22: continued compensatory trunk lean but decreased rotation  GAIT: Comments: WFLs  TODAY'S TREATMENT:  DATE: 01/08/2023 (patient is 9 min late today) Nustep level 3 x 5 min with PT present to discuss status Thoracic stretch over foam roller x 5 holding 10-20 sec Lying lengthwise on foam roller : horizontal abduction with green band Foam roller ppt with march x 20 Foam roller with alternating arm and leg Quadruped bird dog x 20 Patient requested thoracic stretch over foam roller again WITTY on box with 24 inch side up with 1 lb x 20 each letter Open books x10 bilat Supine bridge with hip abduction with red loop 2x10 Supine posterior pelvic tilt with 90/90 heel tap  3x5 Supine in 90/90 table top with cross body TA activation pressing through contralateral thigh 2x10 bilat  DATE: 12/19/2022 Nustep level 3 x6 min with PT present to discuss status Thoracic stretch over foam roller x 5 holding 10-20 sec Quadruped bird dog x 20 Patient requested thoracic stretch over foam roller again Supine on foam roller (along spine) tband horizontal abduction and shoulder ER with red band WITTY on physio ball with 1 lb x 20 each letter Thoracic extension with hands behind head prone over physio ball  Open books x10 bilat Supine bridge with hip abduction with blue loop 2x10 Supine posterior pelvic tilt with 90/90 heel tap 3x5 Supine in 90/90 table top with cross body TA activation pressing through contralateral thigh 2x10 bilat  DATE: 12/19/2022 Nustep level 3 x6 min with PT present to discuss status Open books x10 bilat Throracic extension over white foam roller multiple levels Supine bridge with hip abduction with blue loop 2x10 Supine posterior pelvic tilt with 90/90 heel tap 3x5 Supine in 90/90 table top with cross body TA activation pressing through contralateral thigh 2x10 bilat Trigger Point Dry-Needling  Treatment instructions: Expect mild to moderate muscle soreness. S/S of pneumothorax if dry needled over a lung field, and to seek immediate medical attention should they occur. Patient verbalized understanding of these instructions and education. Patient Consent Given: Yes Education handout provided: Previously provided Muscles treated: bilateral lumbar and thoracic multifid Electrical stimulation performed: No Parameters: N/A Treatment response/outcome: Utilized skilled palpation to identify bony landmarks and trigger points.  Able to illicit twitch response and muscle elongation.  Soft tissue mobilization following to further promote tissue elongation.    PATIENT EDUCATION:  Education details: Educated patient on anatomy and physiology of current  symptoms, prognosis, plan of care as well as initial self care strategies to promote recovery  Person educated: Patient Education method: Explanation Education comprehension: verbalized understanding  HOME EXERCISE PROGRAM: Access Code: [email or web address] URL: [email or web address] Date: 12/04/2022 Prepared by: Mliss  Exercises - Sidelying Diaphragmatic Breathing  - 1 x daily - 7 x weekly - 1 sets - 3-5 reps - 30-45s holds - Supine Thoracic Mobilization Foam Roll Horizontal with Arm Stretch  - 1 x daily - 7 x weekly - 1 sets - 3-5 reps - 30-45s holds - Supine Single Knee to Chest Stretch  - 1 x daily - 7 x weekly - 1 sets - 3-5 reps - 30-45s holds - Cat Cow  - 1 x daily - 7 x weekly - 1 sets - 3-5 reps - 30-45s holds - Sitting Wall Angels  - 1 x daily - 7 x weekly - 1 sets - 3-5 reps - 30-45s holds - Standing Hamstring Stretch on Chair  - 1 x daily - 7 x weekly - 1 sets - 3 reps - 20 sec hold - Standing Quad Stretch with Table and Chair Support  - 1  x daily - 7 x weekly - 1 sets - 3 reps - 20 sec hold - Supine Lower Trunk Rotation  - 1 x daily - 7 x weekly - 1 sets - 20 reps - Supine Posterior Pelvic Tilt  - 1 x daily - 7 x weekly - 1 sets - 20 reps - Supine 90/90 Alternating Heel Touches with Posterior Pelvic Tilt  - 1 x daily - 7 x weekly - 1 sets - 20 reps - Sidelying Thoracic Rotation with Open Book  - 1 x daily - 7 x weekly - 2 sets - 5 reps - Standing Glute Med Mobilization with Small Ball on Wall  - 1 x daily - 3-4 x weekly - 1 sets - 10 reps  ASSESSMENT:  CLINICAL IMPRESSION: Afsheen continues to have some low level back pain and continued wrist pain and now ankle pain.  She is able to do all activities today with exception of T thumb up and Y's on WITTY's.  She may benefit from continued skilled PT through end of POC.  If no significant improvement or progress toward goals, we will discontinue and refer back to MD.     OBJECTIVE IMPAIRMENTS: decreased activity tolerance,  decreased ROM, decreased strength, impaired perceived functional ability, and pain.   ACTIVITY LIMITATIONS: lifting, bending, sitting, standing, locomotion level, and caring for others  PARTICIPATION LIMITATIONS: meal prep, cleaning, laundry, community activity, and occupation  PERSONAL FACTORS: Time since onset of injury/illness/exacerbation are also affecting patient's functional outcome.   REHAB POTENTIAL: Good  CLINICAL DECISION MAKING: Stable/uncomplicated  EVALUATION COMPLEXITY: Low   GOALS: Goals reviewed with patient? Yes  SHORT TERM GOALS: Target date: 10/28/2022    The patient will demonstrate knowledge of basic self care strategies and exercises to promote healing  Baseline: Goal status: MET  2.  The patient will report a 35% improvement in pain levels with functional activities which are currently difficult including bending and lifting the baby in/out of the crib Baseline:  Goal status: MET 11/26/22  3. Lumbar ROM WFLS in all planes needed for taking care of her children MET 10/21/22    LONG TERM GOALS: Target date: 11/25/2022 extended to 01/20/23    The patient will be independent in a safe self progression of a home exercise program to promote further recovery of function  Baseline:  Goal status: IN PROGRESS  2.  The patient will have improved trunk flexor and extensor muscle strength to at least 4+/5 needed for lifting her infant and for return to work in a daycare Baseline:  Goal status: IN PROGRESS  3.  The patient will have improved hip strength to at least 4+/5 needed for standing, walking longer distances and descending stairs at home and in the community  Baseline:  Goal status: IN PROGRESS  4.  Modified Oswestry Index improved to 18% Baseline: Improved by 4% since initial eval Goal status: IN PROGRESS  5. The patient will report a 70% improvement in pain levels with functional activities which are currently difficult including lifting/bending   and twisting tasks   Goal status: In progress  PLAN:  PT FREQUENCY: continue 1x/week  PT DURATION: for an additional 8 weeks  PLANNED INTERVENTIONS: Therapeutic exercises, Therapeutic activity, Neuromuscular re-education, Patient/Family education, Self Care, Joint mobilization, Dry Needling, Electrical stimulation, Spinal mobilization, Cryotherapy, Moist heat, Taping, Traction, Ultrasound, Manual therapy, and Re-evaluation.  PLAN FOR NEXT SESSION:  Continue postural and core strengthening,  LE flexibility and pain control.  Dry needling/manual therapy as indicated.  Delon B. Finnlee Guarnieri, PT 01/08/23 8:54 AM [facility] [address] [place], [place] [address] Phone # [phone] Fax [phone]

## 2023-01-13 ENCOUNTER — Ambulatory Visit: Payer: Medicaid Other

## 2023-01-20 ENCOUNTER — Ambulatory Visit: Payer: Medicaid Other

## 2023-06-07 ENCOUNTER — Other Ambulatory Visit: Payer: Self-pay

## 2023-06-07 ENCOUNTER — Encounter (HOSPITAL_BASED_OUTPATIENT_CLINIC_OR_DEPARTMENT_OTHER): Payer: Self-pay | Admitting: Emergency Medicine

## 2023-06-07 ENCOUNTER — Emergency Department (HOSPITAL_BASED_OUTPATIENT_CLINIC_OR_DEPARTMENT_OTHER)
Admission: EM | Admit: 2023-06-07 | Discharge: 2023-06-07 | Disposition: A | Discharge status: Home / Self Care | Payer: Medicaid Other | Attending: Emergency Medicine | Admitting: Emergency Medicine

## 2023-06-07 DIAGNOSIS — Z79899 Other long term (current) drug therapy: Secondary | ICD-10-CM | POA: Diagnosis not present

## 2023-06-07 DIAGNOSIS — J101 Influenza due to other identified influenza virus with other respiratory manifestations: Secondary | ICD-10-CM | POA: Insufficient documentation

## 2023-06-07 DIAGNOSIS — Z20822 Contact with and (suspected) exposure to covid-19: Secondary | ICD-10-CM | POA: Insufficient documentation

## 2023-06-07 DIAGNOSIS — H9209 Otalgia, unspecified ear: Secondary | ICD-10-CM | POA: Diagnosis present

## 2023-06-07 LAB — RESP PANEL BY RT-PCR (RSV, FLU A&B, COVID)  RVPGX2
Influenza A by PCR: POSITIVE — AB
Influenza B by PCR: NEGATIVE
Resp Syncytial Virus by PCR: NEGATIVE
SARS Coronavirus 2 by RT PCR: NEGATIVE

## 2023-06-07 MED ORDER — ACETAMINOPHEN 500 MG PO TABS
1000.0000 mg | ORAL_TABLET | Freq: Once | ORAL | Status: AC
  Administered 2023-06-07: 1000 mg via ORAL
  Filled 2023-06-07: qty 2

## 2023-06-07 NOTE — ED Triage Notes (Signed)
Right ear ache,congestion to nose and face,fevers/cough started Friday.

## 2023-06-07 NOTE — ED Provider Notes (Signed)
[place] EMERGENCY DEPARTMENT AT [facility] Provider Note   CSN: [id number] Arrival date & time: 06/07/23  1400     History  Chief Complaint  Patient presents with   Kelly Lane is a 28 y.o. female.  This is a otherwise healthy 28 year old female who is here today for earache, congestion, fevers, cough that began on Friday.  Patient currently breast-feeding 58-month-old.   Otalgia      Home Medications Prior to Admission medications   Medication Sig Start Date End Date Taking? Authorizing Provider  docusate sodium (COLACE) 100 MG capsule Take 1 capsule (100 mg total) by mouth 2 (two) times daily. 09/24/22   Brock Bad, MD  docusate sodium (COLACE) 100 MG capsule Take 1 capsule (100 mg total) by mouth 2 (two) times daily. 10/22/22   Brock Bad, MD  ferrous sulfate 325 (65 FE) MG tablet Take 1 tablet (325 mg total) by mouth every other day. Patient not taking: Reported on 08/22/2022 08/15/22   Ndulue, Nadene Rubins, MD  furosemide (LASIX) 20 MG tablet Take 1 tablet (20 mg total) by mouth daily for 5 days. 08/15/22 08/20/22  Ndulue, Chiagoziem J, MD  ibuprofen (ADVIL) 600 MG tablet Take 1 tablet (600 mg total) by mouth every 8 (eight) hours as needed. 08/14/22   Ndulue, Chiagoziem J, MD  NIFEdipine (ADALAT CC) 60 MG 24 hr tablet Take 1 tablet (60 mg total) by mouth daily. Patient not taking: Reported on 08/22/2022 08/15/22   Ndulue, Chiagoziem J, MD  polyethylene glycol (MIRALAX) 17 g packet Take 17 g by mouth daily. 09/24/22   Brock Bad, MD  Prenatal MV & Min w/FA-DHA (PRENATAL GUMMIES PO) Take 1 tablet by mouth daily.    [provider]      Allergies    Compazine [prochlorperazine]    Review of Systems   Review of Systems  HENT:  Positive for ear pain.     Physical Exam Updated Vital Signs BP (!) 142/106 (BP Location: Right Arm)   Pulse (!) 110   Temp (!) 100.5 F (38.1 C)   Resp 16   Wt 61.2 kg   SpO2 100%   BMI  24.69 kg/m  Physical Exam Vitals reviewed.  Constitutional:      Appearance: She is not toxic-appearing.  HENT:     Head: Normocephalic.     Right Ear: Ear canal normal.     Left Ear: Tympanic membrane and ear canal normal.     Ears:     Comments: Effusion behind right tympanic membrane, no bulging, no pus or purulence.    Mouth/Throat:     Mouth: Mucous membranes are moist.  Eyes:     Pupils: Pupils are equal, round, and reactive to light.  Pulmonary:     Effort: Pulmonary effort is normal.     Breath sounds: Normal breath sounds.  Musculoskeletal:     Cervical back: Normal range of motion.  Skin:    General: Skin is warm.     Findings: No rash.  Neurological:     General: No focal deficit present.     Mental Status: She is alert.     ED Results / Procedures / Treatments   Labs (all labs ordered are listed, but only abnormal results are displayed) Labs Reviewed  RESP PANEL BY RT-PCR (RSV, FLU A&B, COVID)  RVPGX2 - Abnormal; Notable for the following components:      Result Value   Influenza A  by PCR POSITIVE (*)    All other components within normal limits    EKG None  Radiology No results found.  Procedures Procedures    Medications Ordered in ED Medications  acetaminophen (TYLENOL) tablet 1,000 mg (1,000 mg Oral Given 06/07/23 1537)    ED Course/ Medical Decision Making/ [name]                                 Medical Decision Making 28 year old female here today for fever, cough, congestion, ear pain.  Differential diagnoses include viral syndrome.  Plan-patient does have small effusion behind the right TM.  Discussed cetirizine with her, and although it is present in breastmilk, his medication we often do give to infant so did advise her if she felt comfortable with this would be appropriate to take cetirizine for the pain in the ear.  Patient did test positive for influenza.  Provided with Tylenol here.  Considered additional labs and the patient, however  they would not change management.  Considered imaging of the patient's chest, however she is not tachypneic, not hypoxic, and has clear lung sounds.  Overall patient looks well.  Outside window for Tamiflu.  Discharge.  Risk OTC drugs.           Final Clinical Impression(s) / ED Diagnoses Final diagnoses:  Influenza A    Rx / DC Orders ED Discharge Orders     None         Arletha Pili, DO 06/07/23 1540

## 2023-06-07 NOTE — Discharge Instructions (Signed)
You can take Zyrtec/cetirizine as instructed by the packaging, this can often help with the ear pain.  Also recommend taking Motrin and Tylenol.  Symptoms should resolve over the next few days.

## 2023-07-29 ENCOUNTER — Telehealth: Payer: Self-pay

## 2023-08-19 ENCOUNTER — Emergency Department (HOSPITAL_BASED_OUTPATIENT_CLINIC_OR_DEPARTMENT_OTHER)
Admission: EM | Admit: 2023-08-19 | Discharge: 2023-08-19 | Disposition: A | Discharge status: Home / Self Care | Attending: Emergency Medicine | Admitting: Emergency Medicine

## 2023-08-19 ENCOUNTER — Other Ambulatory Visit: Payer: Self-pay

## 2023-08-19 ENCOUNTER — Encounter (HOSPITAL_BASED_OUTPATIENT_CLINIC_OR_DEPARTMENT_OTHER): Payer: Self-pay

## 2023-08-19 DIAGNOSIS — J029 Acute pharyngitis, unspecified: Secondary | ICD-10-CM | POA: Insufficient documentation

## 2023-08-19 DIAGNOSIS — B9789 Other viral agents as the cause of diseases classified elsewhere: Secondary | ICD-10-CM

## 2023-08-19 LAB — RESP PANEL BY RT-PCR (RSV, FLU A&B, COVID)  RVPGX2
Influenza A by PCR: NEGATIVE
Influenza B by PCR: NEGATIVE
Resp Syncytial Virus by PCR: NEGATIVE
SARS Coronavirus 2 by RT PCR: NEGATIVE

## 2023-08-19 LAB — GROUP A STREP BY PCR: Group A Strep by PCR: NOT DETECTED

## 2023-08-19 NOTE — ED Provider Notes (Signed)
 [place] EMERGENCY DEPARTMENT AT [facility] Provider Note   CSN: [id number] Arrival date & time: 08/19/23  1816     History  Chief Complaint  Patient presents with   Sore Throat    Kelly Lane is a 28 y.o. female.  Patient with no pertinent past medical history presents today with complaints of sore throat.  She states that same began 2 days ago and has been persistent since then.  She has not tried anything for symptoms.  Denies cough, congestion, fevers, chills, chest pain, shortness of breath.  No headaches or vision changes.  She is able to swallow without issue.  She is here with her child who is sick with similar symptoms.  The history is provided by the patient. No language interpreter was used.  Sore Throat       Home Medications Prior to Admission medications   Medication Sig Start Date End Date Taking? Authorizing Provider  docusate sodium (COLACE) 100 MG capsule Take 1 capsule (100 mg total) by mouth 2 (two) times daily. 09/24/22   Gabrielle Joiner, MD  docusate sodium (COLACE) 100 MG capsule Take 1 capsule (100 mg total) by mouth 2 (two) times daily. 10/22/22   Gabrielle Joiner, MD  ferrous sulfate 325 (65 FE) MG tablet Take 1 tablet (325 mg total) by mouth every other day. Patient not taking: Reported on 08/22/2022 08/15/22   Ndulue, Chiagoziem J, MD  furosemide (LASIX) 20 MG tablet Take 1 tablet (20 mg total) by mouth daily for 5 days. 08/15/22 08/20/22  Ndulue, Chiagoziem J, MD  ibuprofen (ADVIL) 600 MG tablet Take 1 tablet (600 mg total) by mouth every 8 (eight) hours as needed. 08/14/22   Ndulue, Chiagoziem J, MD  NIFEdipine (ADALAT CC) 60 MG 24 hr tablet Take 1 tablet (60 mg total) by mouth daily. Patient not taking: Reported on 08/22/2022 08/15/22   Ndulue, Chiagoziem J, MD  polyethylene glycol (MIRALAX) 17 g packet Take 17 g by mouth daily. 09/24/22   Gabrielle Joiner, MD  Prenatal MV & Min w/FA-DHA (PRENATAL GUMMIES PO) Take 1 tablet by mouth daily.     [provider]      Allergies    Compazine [prochlorperazine]    Review of Systems   Review of Systems  HENT:  Positive for sore throat.   All other systems reviewed and are negative.   Physical Exam Updated Vital Signs BP (!) 143/109 (BP Location: Right Arm)   Pulse (!) 105   Temp 98.4 F (36.9 C) (Oral)   Ht 5\' 2"  (1.575 m)   Wt 59 kg   SpO2 100%   BMI 23.78 kg/m  Physical Exam Vitals and nursing note reviewed.  Constitutional:      General: She is not in acute distress.    Appearance: Normal appearance. She is normal weight. She is not ill-appearing, toxic-appearing or diaphoretic.  HENT:     Head: Normocephalic and atraumatic.     Mouth/Throat:     Mouth: No oral lesions.     Pharynx: Oropharynx is clear. Uvula midline. Posterior oropharyngeal erythema present. No pharyngeal swelling or uvula swelling.     Tonsils: No tonsillar exudate or tonsillar abscesses.  Eyes:     Conjunctiva/sclera: Conjunctivae normal.     Pupils: Pupils are equal, round, and reactive to light.  Neck:     Comments: No meningismus Cardiovascular:     Rate and Rhythm: Normal rate and regular rhythm.     Heart sounds:  Normal heart sounds.  Pulmonary:     Effort: Pulmonary effort is normal. No respiratory distress.     Breath sounds: Normal breath sounds.  Abdominal:     General: Abdomen is flat.     Palpations: Abdomen is soft.     Tenderness: There is no abdominal tenderness.  Musculoskeletal:        General: Normal range of motion.     Cervical back: Normal range of motion and neck supple.  Lymphadenopathy:     Cervical: No cervical adenopathy.  Skin:    General: Skin is warm and dry.  Neurological:     General: No focal deficit present.     Mental Status: She is alert.  Psychiatric:        Mood and Affect: Mood normal.        Behavior: Behavior normal.     ED Results / Procedures / Treatments   Labs (all labs ordered are listed, but only abnormal results are  displayed) Labs Reviewed  GROUP A STREP BY PCR  RESP PANEL BY RT-PCR (RSV, FLU A&B, COVID)  RVPGX2    EKG None  Radiology No results found.  Procedures Procedures    Medications Ordered in ED Medications - No data to display  ED Course/ Medical Decision Making/ [name]                                 Medical Decision Making  Patient presents today with complaints of cough and congestion x 2 days.  They are afebrile, nontoxic-appearing, and in no acute distress with reassuring vital signs.  Physical exam reveals lung sounds clear to auscultation in all fields. No indication for CXR imaging at this time. Patient negative for COVID, flu, and RSV.  However, patient's symptoms likely due to URI, likely viral etiology.  Discussed with patient that there is no indication for antibiotics for viral infections. Will recommend close outpatient follow-up with return precautions. Evaluation and diagnostic testing in the emergency department does not suggest an emergent condition requiring admission or immediate intervention beyond what has been performed at this time.  Plan for discharge with close PCP follow-up.  Patient is understanding and amenable with plan, educated on red flag symptoms that would prompt immediate return.  Patient discharged in stable condition.  Final Clinical Impression(s) / ED Diagnoses Final diagnoses:  Sore throat (viral)    Rx / DC Orders ED Discharge Orders     None     An After Visit Summary was printed and given to the patient.     Tevyn Codd A, PA-C 08/19/23 2037    Onetha Bile, MD 08/20/23 1505

## 2023-08-19 NOTE — ED Triage Notes (Signed)
 Pt reports sore throat x2 days. Pt reports no improvement with cough drops. Pt denies any recent sick contacts.

## 2023-08-19 NOTE — Discharge Instructions (Signed)
 Your work-up in the ER today was reassuring for acute findings. You were swabbed for COVID, flu, RSV, and strep which were negative. However, your symptoms are still likely related to an upper respiratory infection. As these are almost always viral in nature, no antibiotics are indicated. I recommend that you get plenty of rest and focus on symptomatic relief which includes Cepacol throat lozenges for sore throat and tylenol/ibuprofen as needed for fevers and bodyaches. I also recommend:  Increased fluid intake. Sports drinks offer valuable electrolytes, sugars, and fluids.  Breathing heated mist or steam (vaporizer or shower).  Eating chicken soup or other clear broths, and maintaining good nutrition.   Increasing usage of your inhaler if you have asthma.  Return to work when your temperature has returned to normal.  Gargle warm salt water and spit it out for sore throat. Take benadryl or Zyrtec to decrease sinus secretions.  Follow Up: Follow up with your primary care doctor in 5-7 days for recheck of ongoing symptoms.  Return to emergency department for emergent changing or worsening of symptoms.

## 2023-08-25 ENCOUNTER — Other Ambulatory Visit: Payer: Self-pay

## 2023-08-25 ENCOUNTER — Emergency Department (HOSPITAL_COMMUNITY)
Admission: EM | Admit: 2023-08-25 | Discharge: 2023-08-25 | Disposition: A | Discharge status: Home / Self Care | Attending: Emergency Medicine | Admitting: Emergency Medicine

## 2023-08-25 ENCOUNTER — Encounter (HOSPITAL_COMMUNITY): Payer: Self-pay

## 2023-08-25 DIAGNOSIS — J329 Chronic sinusitis, unspecified: Secondary | ICD-10-CM | POA: Insufficient documentation

## 2023-08-25 DIAGNOSIS — J039 Acute tonsillitis, unspecified: Secondary | ICD-10-CM | POA: Diagnosis not present

## 2023-08-25 DIAGNOSIS — J029 Acute pharyngitis, unspecified: Secondary | ICD-10-CM | POA: Diagnosis present

## 2023-08-25 LAB — RESP PANEL BY RT-PCR (RSV, FLU A&B, COVID)  RVPGX2
Influenza A by PCR: NEGATIVE
Influenza B by PCR: NEGATIVE
Resp Syncytial Virus by PCR: NEGATIVE
SARS Coronavirus 2 by RT PCR: NEGATIVE

## 2023-08-25 MED ORDER — AMOXICILLIN 500 MG PO CAPS: 500.0000 mg | ORAL_CAPSULE | Freq: Three times a day (TID) | ORAL | 0 refills | Status: AC

## 2023-08-25 MED ORDER — DEXAMETHASONE SODIUM PHOSPHATE 10 MG/ML IJ SOLN
10.0000 mg | Freq: Once | INTRAMUSCULAR | Status: AC
  Administered 2023-08-25: 10 mg via INTRAMUSCULAR
  Filled 2023-08-25: qty 1

## 2023-08-25 NOTE — ED Triage Notes (Signed)
 Pt arrived reporting sore throat x1 week, no other symptoms. Denies recent exposure to known illness.

## 2023-08-25 NOTE — ED Provider Notes (Signed)
 [name] Provider Note   CSN: [id number] Arrival date & time: 08/25/23  1555     History  Chief Complaint  Patient presents with   Sore Throat    Kelly Lane is a 27 y.o. female.  28 year old female presents today for concern of sinus congestion, postnasal drip, sore throat, some pharyngitis.  Symptoms started Monday last week..  Denies fever.  Patient works at a daycare.  The history is provided by the patient. No language interpreter was used.       Home Medications Prior to Admission medications   Medication Sig Start Date End Date Taking? Authorizing Provider  amoxicillin  (AMOXIL ) 500 MG capsule Take 1 capsule (500 mg total) by mouth 3 (three) times daily for 7 days. 08/25/23 09/01/23 Yes Rabiah Goeser, PA-C  docusate sodium  (COLACE) 100 MG capsule Take 1 capsule (100 mg total) by mouth 2 (two) times daily. 09/24/22   Gabrielle Joiner, MD  docusate sodium  (COLACE) 100 MG capsule Take 1 capsule (100 mg total) by mouth 2 (two) times daily. 10/22/22   Gabrielle Joiner, MD  ferrous sulfate  325 (65 FE) MG tablet Take 1 tablet (325 mg total) by mouth every other day. Patient not taking: Reported on 08/22/2022 08/15/22   Ndulue, Chiagoziem J, MD  furosemide  (LASIX ) 20 MG tablet Take 1 tablet (20 mg total) by mouth daily for 5 days. 08/15/22 08/20/22  Ndulue, Chiagoziem J, MD  ibuprofen  (ADVIL ) 600 MG tablet Take 1 tablet (600 mg total) by mouth every 8 (eight) hours as needed. 08/14/22   Ndulue, Chiagoziem J, MD  NIFEdipine  (ADALAT  CC) 60 MG 24 hr tablet Take 1 tablet (60 mg total) by mouth daily. Patient not taking: Reported on 08/22/2022 08/15/22   Ndulue, Chiagoziem J, MD  polyethylene glycol (MIRALAX ) 17 g packet Take 17 g by mouth daily. 09/24/22   Gabrielle Joiner, MD  Prenatal MV & Min w/FA-DHA (PRENATAL GUMMIES PO) Take 1 tablet by mouth daily.    [provider]      Allergies    Compazine  [prochlorperazine ]    Review of  Systems   Review of Systems  HENT:  Positive for congestion, postnasal drip, sore throat and voice change. Negative for trouble swallowing.   Respiratory:  Negative for cough and shortness of breath.   Cardiovascular:  Negative for chest pain.  All other systems reviewed and are negative.   Physical Exam Updated Vital Signs BP (!) 138/92   Pulse 73   Temp 98.2 F (36.8 C) (Oral)   Resp 16   Ht 5\' 2"  (1.575 m)   Wt 58 kg   LMP 07/17/2023   SpO2 100%   BMI 23.39 kg/m  Physical Exam Vitals and nursing note reviewed.  Constitutional:      General: She is not in acute distress.    Appearance: Normal appearance. She is not ill-appearing.  HENT:     Head: Normocephalic and atraumatic.     Nose: Nose normal.     Mouth/Throat:     Mouth: Mucous membranes are moist.     Pharynx: Posterior oropharyngeal erythema present. No oropharyngeal exudate.  Eyes:     Conjunctiva/sclera: Conjunctivae normal.  Cardiovascular:     Rate and Rhythm: Normal rate and regular rhythm.  Pulmonary:     Effort: Pulmonary effort is normal. No respiratory distress.  Musculoskeletal:        General: No deformity.  Skin:    Findings: No rash.  Neurological:     Mental Status: She is alert.     ED Results / Procedures / Treatments   Labs (all labs ordered are listed, but only abnormal results are displayed) Labs Reviewed  RESP PANEL BY RT-PCR (RSV, FLU A&B, COVID)  RVPGX2    EKG None  Radiology No results found.  Procedures Procedures    Medications Ordered in ED Medications  dexamethasone  (DECADRON ) injection 10 mg (has no administration in time range)    ED Course/ Medical Decision Making/ [name]                                 Medical Decision Making Risk Prescription drug management.   28 year old female presents today for concern of above-mentioned complaints.  Overall she is well-appearing.  Has some erythema to her pharynx otherwise no evidence of retropharyngeal abscess,  peritonsillar abscess, or Ludwig's angina.  Hemodynamically stable.  Tolerating p.o. intake and has normal voice.  She states that she notices that her voice does diminish throughout the day.  Likely sinusitis with a component of tonsillitis.  Decadron  given in the emergency department.  Amoxicillin  prescribed. Discharged in stable condition.  Return precaution discussed.  Patient voices understanding and is in agreement with plan.   Final Clinical Impression(s) / ED Diagnoses Final diagnoses:  Tonsillitis  Sinusitis, unspecified chronicity, unspecified location    Rx / DC Orders ED Discharge Orders          Ordered    amoxicillin  (AMOXIL ) 500 MG capsule  3 times daily        08/25/23 2012              Lucina Sabal, Kirby Peoples 08/25/23 2158    Arvilla Birmingham, MD 08/25/23 [phone]

## 2023-08-25 NOTE — Discharge Instructions (Signed)
 Your exam today was overall reassuring.  You likely have sinusitis combined with some tonsillitis.  You received a shot of steroid in the emergency department.  Follow-up with your PCP.  Return for any emergent symptoms.  Amoxicillin  sent into the pharmacy.

## 2023-08-25 NOTE — ED Provider Triage Note (Signed)
 Emergency Medicine Provider Triage Evaluation Note  KYOMI HECTOR , a 28 y.o. female  was evaluated in triage.  Pt complains of sore throat and coughx`1 week. Reports baby was diagnosed with adenovirus recently. No SOB.  Review of Systems  Positive: Sore throat Negative: fevers  Physical Exam  BP (!) 138/92   Pulse 73   Temp 98.2 F (36.8 C) (Oral)   Resp 16   SpO2 100%  Gen:   Awake, no distress   Resp:  Normal effort  MSK:   Moves extremities without difficulty  Other:    Medical Decision Making  Medically screening exam initiated at 4:15 PM.  Appropriate orders placed.  Richelle I S[place] was informed that the remainder of the evaluation will be completed by another provider, this initial triage assessment does not replace that evaluation, and the importance of remaining in the ED until their evaluation is complete.    Timmy Forbes, [place] 08/25/23 1616

## 2023-08-26 ENCOUNTER — Telehealth: Admitting: Physician Assistant

## 2023-08-26 DIAGNOSIS — O9102 Infection of nipple associated with the puerperium: Secondary | ICD-10-CM

## 2023-08-26 DIAGNOSIS — B3789 Other sites of candidiasis: Secondary | ICD-10-CM

## 2023-08-26 MED ORDER — CLOTRIMAZOLE 1 % EX CREA: 1.0000 | TOPICAL_CREAM | Freq: Two times a day (BID) | CUTANEOUS | 0 refills | Status: AC

## 2023-08-26 NOTE — Patient Instructions (Signed)
 Kelly Lane, thank you for joining Hyla Maillard, PA-C for today's virtual visit.  While this provider is not your primary care provider (PCP), if your PCP is located in our provider database this encounter information will be shared with them immediately following your visit.   A [facility] MyChart account gives you access to today's visit and all your visits, tests, and labs performed at [facility] " click here if you don't have a [facility] MyChart account or go to [email or web address]  Consent: (Patient) Kelly Lane provided verbal consent for this virtual visit at the beginning of the encounter.  Current Medications:  Current Outpatient Medications:    clotrimazole  (LOTRIMIN ) 1 % cream, Apply 1 Application topically 2 (two) times daily., Disp: 30 g, Rfl: 0   amoxicillin  (AMOXIL ) 500 MG capsule, Take 1 capsule (500 mg total) by mouth 3 (three) times daily for 7 days., Disp: 21 capsule, Rfl: 0   docusate sodium  (COLACE) 100 MG capsule, Take 1 capsule (100 mg total) by mouth 2 (two) times daily., Disp: 60 capsule, Rfl: 11   docusate sodium  (COLACE) 100 MG capsule, Take 1 capsule (100 mg total) by mouth 2 (two) times daily., Disp: 60 capsule, Rfl: 11   ferrous sulfate  325 (65 FE) MG tablet, Take 1 tablet (325 mg total) by mouth every other day. (Patient not taking: Reported on 08/22/2022), Disp: 30 tablet, Rfl: 0   furosemide  (LASIX ) 20 MG tablet, Take 1 tablet (20 mg total) by mouth daily for 5 days., Disp: 5 tablet, Rfl: 0   ibuprofen  (ADVIL ) 600 MG tablet, Take 1 tablet (600 mg total) by mouth every 8 (eight) hours as needed., Disp: 30 tablet, Rfl: 0   NIFEdipine  (ADALAT  CC) 60 MG 24 hr tablet, Take 1 tablet (60 mg total) by mouth daily. (Patient not taking: Reported on 08/22/2022), Disp: 60 tablet, Rfl: 0   polyethylene glycol (MIRALAX ) 17 g packet, Take 17 g by mouth daily., Disp: 60 each, Rfl: 11   Prenatal MV & Min w/FA-DHA (PRENATAL GUMMIES PO), Take 1 tablet  by mouth daily., Disp: , Rfl:    Medications ordered in this encounter:  Meds ordered this encounter  Medications   clotrimazole  (LOTRIMIN ) 1 % cream    Sig: Apply 1 Application topically 2 (two) times daily.    Dispense:  30 g    Refill:  0    Supervising Provider:   Corine Dice [[id number]]     *If you need refills on other medications prior to your next appointment, please contact your pharmacy*  Follow-Up: Call back or seek an in-person evaluation if the symptoms worsen or if the condition fails to improve as anticipated.  [facility] [phone]  Other Instructions Please keep the skin clean and dry. Please start the topical cream twice daily as discussed. Please contact your GYN for ongoing management and reach out to your little one's pediatrician due to concern they have oral thrush.   If you have been instructed to have an in-person evaluation today at a local Urgent Care facility, please use the link below. It will take you to a list of all of our available [facility], including address, phone number and hours of operation. Please do not delay care.  [facility]  If you or a family member do not have a primary care provider, use the link below to schedule a visit and establish care. When you choose a  primary care  physician or advanced practice provider, you gain a long-term partner in health. Find a Primary Care Provider  Learn more about [facility] in-office and virtual care options: [facility] - Get Care Now

## 2023-08-26 NOTE — Progress Notes (Signed)
 Virtual Visit Consent   Kelly Lane, you are scheduled for a virtual visit with a [facility] provider today. Just as with appointments in the office, your consent must be obtained to participate. Your consent will be active for this visit and any virtual visit you may have with one of our providers in the next 365 days. If you have a MyChart account, a copy of this consent can be sent to you electronically.  As this is a virtual visit, video technology does not allow for your provider to perform a traditional examination. This may limit your provider's ability to fully assess your condition. If your provider identifies any concerns that need to be evaluated in person or the need to arrange testing (such as labs, EKG, etc.), we will make arrangements to do so. Although advances in technology are sophisticated, we cannot ensure that it will always work on either your end or our end. If the connection with a video visit is poor, the visit may have to be switched to a telephone visit. With either a video or telephone visit, we are not always able to ensure that we have a secure connection.  By engaging in this virtual visit, you consent to the provision of healthcare and authorize for your insurance to be billed (if applicable) for the services provided during this visit. Depending on your insurance coverage, you may receive a charge related to this service.  I need to obtain your verbal consent now. Are you willing to proceed with your visit today? Kimberlly I [place] has provided verbal consent on 08/26/2023 for a virtual visit (video or telephone). Hyla Maillard, [place]  Date: 08/26/2023 2:06 PM   Virtual Visit via Video Note   I, Hyla Maillard, connected with  Kelly Lane  ([id number], 1995/09/10) on 08/26/23 at  1:45 PM EDT by a video-enabled telemedicine application and verified that I am speaking with the correct person using two identifiers.  Location: Patient: Virtual Visit Location  Patient: Home Provider: Virtual Visit Location Provider: Home Office   I discussed the limitations of evaluation and management by telemedicine and the availability of in person appointments. The patient expressed understanding and agreed to proceed.    History of Present Illness: Kelly Lane is a 28 y.o. who identifies as a female who was assigned female at birth, and is being seen today for irritation and flanking around her left nipple noted over the past several days while breastfeeding and pumping. Has had an issue with some mild nipple eczema from breastfeeding but this is different to her. Notes substantial flaking and itching but denies fever, chills, redness, warmth or overt pain. Little one is eating well but has noted white patches in her mouth. Has not reached out to her OBGYN or the infant's pediatrician.  HPI: HPI  Problems:  Patient Active Problem List   Diagnosis Date Noted   VBAC (vaginal birth after Cesarean) 08/12/2022   Chronic hypertension affecting pregnancy 08/10/2022   H/O severe pre-eclampsia 04/17/2022   H/O emergency cesarean section 09/20/2020   Rh negative state in antepartum period 06/07/2020    Allergies:  Allergies  Allergen Reactions   Compazine  [Prochlorperazine ] Anxiety   Medications:  Current Outpatient Medications:    clotrimazole  (LOTRIMIN ) 1 % cream, Apply 1 Application topically 2 (two) times daily., Disp: 30 g, Rfl: 0   amoxicillin  (AMOXIL ) 500 MG capsule, Take 1 capsule (500 mg total) by mouth 3 (three) times daily for 7 days., Disp: 21 capsule,  Rfl: 0   docusate sodium  (COLACE) 100 MG capsule, Take 1 capsule (100 mg total) by mouth 2 (two) times daily., Disp: 60 capsule, Rfl: 11   docusate sodium  (COLACE) 100 MG capsule, Take 1 capsule (100 mg total) by mouth 2 (two) times daily., Disp: 60 capsule, Rfl: 11   ferrous sulfate  325 (65 FE) MG tablet, Take 1 tablet (325 mg total) by mouth every other day. (Patient not taking: Reported on  08/22/2022), Disp: 30 tablet, Rfl: 0   furosemide  (LASIX ) 20 MG tablet, Take 1 tablet (20 mg total) by mouth daily for 5 days., Disp: 5 tablet, Rfl: 0   ibuprofen  (ADVIL ) 600 MG tablet, Take 1 tablet (600 mg total) by mouth every 8 (eight) hours as needed., Disp: 30 tablet, Rfl: 0   NIFEdipine  (ADALAT  CC) 60 MG 24 hr tablet, Take 1 tablet (60 mg total) by mouth daily. (Patient not taking: Reported on 08/22/2022), Disp: 60 tablet, Rfl: 0   polyethylene glycol (MIRALAX ) 17 g packet, Take 17 g by mouth daily., Disp: 60 each, Rfl: 11   Prenatal MV & Min w/FA-DHA (PRENATAL GUMMIES PO), Take 1 tablet by mouth daily., Disp: , Rfl:   Observations/Objective: Patient is well-developed, well-nourished in no acute distress.  Resting comfortably at home.  Head is normocephalic, atraumatic.  No labored breathing. Speech is clear and coherent with logical content.  Patient is alert and oriented at baseline.   Assessment and Plan: 1. Yeast infection of nipple, postpartum (Primary) - clotrimazole  (LOTRIMIN ) 1 % cream; Apply 1 Application topically 2 (two) times daily.  Dispense: 30 g; Refill: 0  Supportive measures and OTC medications reviewed. Will start topical clotrimazole  cream BID. Needs follow-up with GYN and to reach out to infant's pediatrician giving concern for thrush in infant.   Follow Up Instructions: I discussed the assessment and treatment plan with the patient. The patient was provided an opportunity to ask questions and all were answered. The patient agreed with the plan and demonstrated an understanding of the instructions.  A copy of instructions were sent to the patient via MyChart unless otherwise noted below.   The patient was advised to call back or seek an in-person evaluation if the symptoms worsen or if the condition fails to improve as anticipated.    Hyla Maillard, PA-C

## 2023-09-17 ENCOUNTER — Other Ambulatory Visit (HOSPITAL_COMMUNITY)
Admission: RE | Admit: 2023-09-17 | Discharge: 2023-09-17 | Disposition: A | Discharge status: Home / Self Care | Source: Ambulatory Visit | Attending: Obstetrics | Admitting: Obstetrics

## 2023-09-17 ENCOUNTER — Encounter: Payer: Self-pay | Admitting: Obstetrics

## 2023-09-17 ENCOUNTER — Ambulatory Visit: Admitting: Obstetrics

## 2023-09-17 VITALS — BP 123/87 | HR 84 | Ht 62.0 in | Wt 142.2 lb

## 2023-09-17 DIAGNOSIS — Z01419 Encounter for gynecological examination (general) (routine) without abnormal findings: Secondary | ICD-10-CM | POA: Diagnosis present

## 2023-09-17 DIAGNOSIS — N898 Other specified noninflammatory disorders of vagina: Secondary | ICD-10-CM | POA: Diagnosis not present

## 2023-09-17 DIAGNOSIS — Z113 Encounter for screening for infections with a predominantly sexual mode of transmission: Secondary | ICD-10-CM | POA: Diagnosis not present

## 2023-09-17 MED ORDER — PRENATE MINI 18-0.6-0.4-350 MG PO CAPS: 1.0000 | ORAL_CAPSULE | Freq: Every day | ORAL | 4 refills | Status: AC

## 2023-09-17 NOTE — Progress Notes (Unsigned)
 Subjective:        Kelly Lane is a 28 y.o. female here for a routine exam.  Current complaints: Vaginal discharge.  Hair loss.  PMS-like symptoms before and after period..    Personal health questionnaire:  Is patient Ashkenazi Jewish, have a family history of breast and/or ovarian cancer: {YES NO:22349} Is there a family history of uterine cancer diagnosed at age < 52, gastrointestinal cancer, urinary tract cancer, family member who is a Personnel officer syndrome-associated carrier: {YES NO:22349} Is the patient overweight and hypertensive, family history of diabetes, personal history of gestational diabetes, preeclampsia or PCOS: {YES NO:22349} Is patient over 27, have PCOS,  family history of premature CHD under age 26, diabetes, smoke, have hypertension or peripheral artery disease:  {YES NO:22349} At any time, has a partner hit, kicked or otherwise hurt or frightened you?: {YES NO:22349} Over the past 2 weeks, have you felt down, depressed or hopeless?: {YES NO:22349} Over the past 2 weeks, have you felt little interest or pleasure in doing things?:{M;yes/no/sometimes:15259}   Gynecologic History Patient's last menstrual period was 08/15/2023 (approximate). Contraception: {method:5051} Last Pap: ***. Results were: {norm/abn:16337} Last mammogram: ***. Results were: {norm/abn:16337}  Obstetric History OB History  Gravida Para Term Preterm AB Living  3 2 1 1 1 2   SAB IAB Ectopic Multiple Live Births  0 1 0 0 2    # Outcome Date GA Lbr Len/2nd Weight Sex Type Anes PTL Lv  3 Term 08/12/22 [redacted]w[redacted]d / 02:23 5 lb 14.9 oz (2.69 kg) F VBAC EPI  LIV  2 Preterm 09/20/20 [redacted]w[redacted]d  3 lb 9.1 oz (1.62 kg) M CS-LTranv Spinal  LIV  1 IAB 01/19/18 [redacted]w[redacted]d           Past Medical History:  Diagnosis Date   Anxiety    Endometriosis 07/2015   GERD (gastroesophageal reflux disease)    NO MEDS   Ovarian cyst 07/2015    Past Surgical History:  Procedure Laterality Date   BREAST BIOPSY Left 11/12/2022    US  LT BREAST BX W LOC DEV 1ST LESION IMG BX SPEC US  GUIDE 11/12/2022 [facility] MAMMOGRAPHY   CESAREAN SECTION N/A 09/20/2020   Procedure: CESAREAN SECTION;  Surgeon: Julianne Octave, MD;  Location: [facility] LD ORS;  Service: Obstetrics;  Laterality: N/A;   LAPAROSCOPIC OVARIAN CYSTECTOMY Right 07/17/2015   Procedure: EXCISION OF ENDOMETRIOSIS;  Surgeon: Alben Alma, MD;  Location: [facility] ORS;  Service: Gynecology;  Laterality: Right;   LAPAROSCOPY N/A 07/17/2015   Procedure: LAPAROSCOPY OPERATIVE;  Surgeon: Alben Alma, MD;  Location: [facility] ORS;  Service: Gynecology;  Laterality: N/A;     Current Outpatient Medications:    ibuprofen  (ADVIL ) 600 MG tablet, Take 1 tablet (600 mg total) by mouth every 8 (eight) hours as needed., Disp: 30 tablet, Rfl: 0   Prenatal MV & Min w/FA-DHA (PRENATAL GUMMIES PO), Take 1 tablet by mouth daily., Disp: , Rfl:    clotrimazole  (LOTRIMIN ) 1 % cream, Apply 1 Application topically 2 (two) times daily. (Patient not taking: Reported on 09/17/2023), Disp: 30 g, Rfl: 0   docusate sodium  (COLACE) 100 MG capsule, Take 1 capsule (100 mg total) by mouth 2 (two) times daily. (Patient not taking: Reported on 09/17/2023), Disp: 60 capsule, Rfl: 11   docusate sodium  (COLACE) 100 MG capsule, Take 1 capsule (100 mg total) by mouth 2 (two) times daily. (Patient not taking: Reported on 09/17/2023), Disp: 60 capsule, Rfl: 11   ferrous sulfate  325 (65 FE) MG tablet,  Take 1 tablet (325 mg total) by mouth every other day. (Patient not taking: Reported on 08/22/2022), Disp: 30 tablet, Rfl: 0   furosemide  (LASIX ) 20 MG tablet, Take 1 tablet (20 mg total) by mouth daily for 5 days., Disp: 5 tablet, Rfl: 0   NIFEdipine  (ADALAT  CC) 60 MG 24 hr tablet, Take 1 tablet (60 mg total) by mouth daily. (Patient not taking: Reported on 08/22/2022), Disp: 60 tablet, Rfl: 0   polyethylene glycol (MIRALAX ) 17 g packet, Take 17 g by mouth daily. (Patient not taking: Reported on 09/17/2023), Disp: 60 each, Rfl:  11 Allergies  Allergen Reactions   Compazine  [Prochlorperazine ] Anxiety    Social History   Tobacco Use   Smoking status: Never   Smokeless tobacco: Never  Substance Use Topics   Alcohol use: Not Currently    Comment: every six months maybe 1 drink    Family History  Problem Relation Age of Onset   Hypertension Mother    Asthma Neg Hx    Cancer Neg Hx    Diabetes Neg Hx    Heart disease Neg Hx    BRCA 1/2 Neg Hx    Breast cancer Neg Hx       Review of Systems  Constitutional: negative for fatigue and weight loss Respiratory: negative for cough and wheezing Cardiovascular: negative for chest pain, fatigue and palpitations Gastrointestinal: negative for abdominal pain and change in bowel habits Musculoskeletal:negative for myalgias Neurological: negative for gait problems and tremors Behavioral/Psych: negative for abusive relationship, depression Endocrine: negative for temperature intolerance    Genitourinary:negative for abnormal menstrual periods, genital lesions, hot flashes, sexual problems and vaginal discharge Integument/breast: negative for breast lump, breast tenderness, nipple discharge and skin lesion(s)    Objective:       BP 123/87   Pulse 84   Ht 5\' 2"  (1.575 m)   Wt 142 lb 3.2 oz (64.5 kg)   LMP 08/15/2023 (Approximate)   BMI 26.01 kg/m  General:   alert  Skin:   no rash or abnormalities  Lungs:   clear to auscultation bilaterally  Heart:   regular rate and rhythm, S1, S2 normal, no murmur, click, rub or gallop  Breasts:   normal without suspicious masses, skin or nipple changes or axillary nodes  Abdomen:  normal findings: no organomegaly, soft, non-tender and no hernia  Pelvis:  External genitalia: normal general appearance Urinary system: urethral meatus normal and bladder without fullness, nontender Vaginal: normal without tenderness, induration or masses Cervix: normal appearance Adnexa: normal bimanual exam Uterus: anteverted and  non-tender, normal size   Lab Review Urine pregnancy test Labs reviewed {YES NO:22349} Radiologic studies reviewed {YES NO:22349}  ***% of *** min visit spent on counseling and coordination of care.   Assessment:    Healthy female exam.    Plan:    {plan:19193}   No orders of the defined types were placed in this encounter.  No orders of the defined types were placed in this encounter.  Need to obtain previous records Possible management options include:*** Follow up as needed. ***

## 2023-09-17 NOTE — Progress Notes (Unsigned)
 Pt presents for annual exam. Tired today, low energy. Would like all testing.

## 2023-09-18 LAB — COMPREHENSIVE METABOLIC PANEL WITH GFR
ALT: 11 IU/L (ref 0–32)
AST: 18 IU/L (ref 0–40)
Albumin: 4.4 g/dL (ref 4.0–5.0)
Alkaline Phosphatase: 88 IU/L (ref 44–121)
BUN/Creatinine Ratio: 15 (ref 9–23)
BUN: 10 mg/dL (ref 6–20)
Bilirubin Total: 1.2 mg/dL (ref 0.0–1.2)
CO2: 24 mmol/L (ref 20–29)
Calcium: 9.9 mg/dL (ref 8.7–10.2)
Chloride: 103 mmol/L (ref 96–106)
Creatinine, Ser: 0.67 mg/dL (ref 0.57–1.00)
Globulin, Total: 2.2 g/dL (ref 1.5–4.5)
Glucose: 65 mg/dL — ABNORMAL LOW (ref 70–99)
Potassium: 4 mmol/L (ref 3.5–5.2)
Sodium: 142 mmol/L (ref 134–144)
Total Protein: 6.6 g/dL (ref 6.0–8.5)
eGFR: 122 mL/min/{1.73_m2} (ref >59)

## 2023-09-18 LAB — FERRITIN: Ferritin: 27 ng/mL (ref 15–150)

## 2023-09-18 LAB — CBC
Hematocrit: 39.7 % (ref 34.0–46.6)
Hemoglobin: 13.4 g/dL (ref 11.1–15.9)
MCH: 32.8 pg (ref 26.6–33.0)
MCHC: 33.8 g/dL (ref 31.5–35.7)
MCV: 97 fL (ref 79–97)
Platelets: 260 10*3/uL (ref 150–450)
RBC: 4.09 x10E6/uL (ref 3.77–5.28)
RDW: 12 % (ref 11.7–15.4)
WBC: 4.7 10*3/uL (ref 3.4–10.8)

## 2023-09-18 LAB — HIV ANTIBODY (ROUTINE TESTING W REFLEX): HIV Screen 4th Generation wRfx: NONREACTIVE

## 2023-09-18 LAB — HEPATITIS B SURFACE ANTIGEN: Hepatitis B Surface Ag: NEGATIVE

## 2023-09-18 LAB — CERVICOVAGINAL ANCILLARY ONLY
Chlamydia: NEGATIVE
Comment: NEGATIVE
Comment: NEGATIVE
Comment: NORMAL
Neisseria Gonorrhea: NEGATIVE
Trichomonas: NEGATIVE

## 2023-09-18 LAB — HEMOGLOBIN A1C
Est. average glucose Bld gHb Est-mCnc: 82 mg/dL
Hgb A1c MFr Bld: 4.5 % — ABNORMAL LOW (ref 4.8–5.6)

## 2023-09-18 LAB — RPR: RPR Ser Ql: NONREACTIVE

## 2023-09-18 LAB — TSH: TSH: 1.4 u[IU]/mL (ref 0.450–4.500)

## 2023-09-18 LAB — HEPATITIS C ANTIBODY: Hep C Virus Ab: NONREACTIVE

## 2023-09-22 ENCOUNTER — Ambulatory Visit: Payer: Self-pay | Admitting: Family Medicine

## 2023-09-22 LAB — CYTOLOGY - PAP: Diagnosis: NEGATIVE

## 2023-12-02 DIAGNOSIS — R519 Headache, unspecified: Secondary | ICD-10-CM | POA: Diagnosis not present

## 2023-12-02 DIAGNOSIS — J029 Acute pharyngitis, unspecified: Secondary | ICD-10-CM | POA: Diagnosis not present

## 2023-12-02 DIAGNOSIS — Z20822 Contact with and (suspected) exposure to covid-19: Secondary | ICD-10-CM | POA: Diagnosis not present

## 2023-12-02 DIAGNOSIS — R6883 Chills (without fever): Secondary | ICD-10-CM | POA: Diagnosis not present

## 2023-12-02 DIAGNOSIS — J039 Acute tonsillitis, unspecified: Secondary | ICD-10-CM | POA: Diagnosis not present

## 2023-12-24 ENCOUNTER — Telehealth: Admitting: Family Medicine

## 2023-12-24 DIAGNOSIS — N920 Excessive and frequent menstruation with regular cycle: Secondary | ICD-10-CM

## 2023-12-24 NOTE — Progress Notes (Signed)
 Virtual Visit Consent   Kelly Lane, you are scheduled for a virtual visit with a [facility] provider today. Just as with appointments in the office, your consent must be obtained to participate. Your consent will be active for this visit and any virtual visit you may have with one of our providers in the next 365 days. If you have a MyChart account, a copy of this consent can be sent to you electronically.  As this is a virtual visit, video technology does not allow for your provider to perform a traditional examination. This may limit your provider's ability to fully assess your condition. If your provider identifies any concerns that need to be evaluated in person or the need to arrange testing (such as labs, EKG, etc.), we will make arrangements to do so. Although advances in technology are sophisticated, we cannot ensure that it will always work on either your end or our end. If the connection with a video visit is poor, the visit may have to be switched to a telephone visit. With either a video or telephone visit, we are not always able to ensure that we have a secure connection.  By engaging in this virtual visit, you consent to the provision of healthcare and authorize for your insurance to be billed (if applicable) for the services provided during this visit. Depending on your insurance coverage, you may receive a charge related to this service.  I need to obtain your verbal consent now. Are you willing to proceed with your visit today? Kelly Lane has provided verbal consent on 12/24/2023 for a virtual visit (video or telephone). Olam DELENA Darby, FNP  Date: 12/24/2023 1:45 PM   Virtual Visit via Video Note   I, Olam DELENA Darby, connected with  Kelly Lane  ([id number], 31-Mar-1996) on 12/24/23 at  1:30 PM EDT by a video-enabled telemedicine application and verified that I am speaking with the correct person using two identifiers.  Location: Patient: Virtual Visit Location Patient: Home- in  her car Provider: Virtual Visit Location Provider: Home Office   I discussed the limitations of evaluation and management by telemedicine and the availability of in person appointments. The patient expressed understanding and agreed to proceed.    History of Present Illness: Kelly Lane is a 28 y.o.  female and is being seen today for vaginal bleeding. She reports heavy menstrual bleeding and cramps. Tried to get in with her OB-GYN but they didn't have a visit for 2 weeks. Her LMP was 12/22/2023. Last period prior was 11/25/2023. Usually would only have 1 day of heavy bleeding but the last couple of periods they have been extremely heavy and is needing to use a tampon and pad and changing them hourly. This heavy bleeding is lasting 2-3 days and light bleeding another 2-3. Typical bleeding is about 5 days total.    No LOC, no dizziness or lightheadedness reported.   Still nursing her 65 year old baby!  HPI:  Problems:  Patient Active Problem List   Diagnosis Date Noted   VBAC (vaginal birth after Cesarean) 08/12/2022   Chronic hypertension affecting pregnancy 08/10/2022   H/O severe pre-eclampsia 04/17/2022   H/O emergency cesarean section 09/20/2020   Rh negative state in antepartum period 06/07/2020    Allergies:  Allergies  Allergen Reactions   Compazine  [Prochlorperazine ] Anxiety   Medications:  Current Outpatient Medications:    clotrimazole  (LOTRIMIN ) 1 % cream, Apply 1 Application topically 2 (two) times daily. (Patient not taking: Reported  on 09/17/2023), Disp: 30 g, Rfl: 0   docusate sodium  (COLACE) 100 MG capsule, Take 1 capsule (100 mg total) by mouth 2 (two) times daily. (Patient not taking: Reported on 09/17/2023), Disp: 60 capsule, Rfl: 11   docusate sodium  (COLACE) 100 MG capsule, Take 1 capsule (100 mg total) by mouth 2 (two) times daily. (Patient not taking: Reported on 09/17/2023), Disp: 60 capsule, Rfl: 11   ferrous sulfate  325 (65 FE) MG tablet, Take 1 tablet (325 mg  total) by mouth every other day. (Patient not taking: Reported on 08/22/2022), Disp: 30 tablet, Rfl: 0   furosemide  (LASIX ) 20 MG tablet, Take 1 tablet (20 mg total) by mouth daily for 5 days., Disp: 5 tablet, Rfl: 0   ibuprofen  (ADVIL ) 600 MG tablet, Take 1 tablet (600 mg total) by mouth every 8 (eight) hours as needed., Disp: 30 tablet, Rfl: 0   NIFEdipine  (ADALAT  CC) 60 MG 24 hr tablet, Take 1 tablet (60 mg total) by mouth daily. (Patient not taking: Reported on 08/22/2022), Disp: 60 tablet, Rfl: 0   polyethylene glycol (MIRALAX ) 17 g packet, Take 17 g by mouth daily. (Patient not taking: Reported on 09/17/2023), Disp: 60 each, Rfl: 11   Prenat-FeCbn-FeAsp-Meth-FA-DHA (PRENATE MINI ) 18-0.6-0.4-350 MG CAPS, Take 1 capsule by mouth daily before breakfast., Disp: 90 capsule, Rfl: 4   Prenatal MV & Min w/FA-DHA (PRENATAL GUMMIES PO), Take 1 tablet by mouth daily., Disp: , Rfl:   Observations/Objective: Patient is well-developed, well-nourished in no acute distress.  Resting comfortably in her car.  Head is normocephalic, atraumatic.  No labored breathing.  Speech is clear and coherent with logical content.  Patient is alert and oriented at baseline.    Assessment and Plan: 1. Menorrhagia with regular cycle (Primary)  May stay home from work today/ tomorrow if needed. Keep follow up appointment as scheduled in the next 2 weeks with OB-GYN for further work up of cause for your heavy bleeding. 600 mg of ibuprofen  can be taken 3 times daily as needed for heavy cramping.  Follow Up Instructions: I discussed the assessment and treatment plan with the patient. The patient was provided an opportunity to ask questions and all were answered. The patient agreed with the plan and demonstrated an understanding of the instructions.  A copy of instructions were sent to the patient via MyChart unless otherwise noted below.   The patient was advised to call back or seek an in-person evaluation if the symptoms  worsen or if the condition fails to improve as anticipated.    Olam DELENA Darby, FNP

## 2023-12-24 NOTE — Patient Instructions (Addendum)
 Kelly Lane, thank you for joining Kelly DELENA Darby, FNP for today's virtual visit.  While this provider is not your primary care provider (PCP), if your PCP is located in our provider database this encounter information will be shared with them immediately following your visit.   A [facility] MyChart account gives you access to today's visit and all your visits, tests, and labs performed at [facility]  click here if you don't have a [facility] MyChart account or go to [email or web address]  Consent: (Patient) Kelly Lane provided verbal consent for this virtual visit at the beginning of the encounter.  Current Medications:  Current Outpatient Medications:    clotrimazole  (LOTRIMIN ) 1 % cream, Apply 1 Application topically 2 (two) times daily. (Patient not taking: Reported on 09/17/2023), Disp: 30 g, Rfl: 0   docusate sodium  (COLACE) 100 MG capsule, Take 1 capsule (100 mg total) by mouth 2 (two) times daily. (Patient not taking: Reported on 09/17/2023), Disp: 60 capsule, Rfl: 11   docusate sodium  (COLACE) 100 MG capsule, Take 1 capsule (100 mg total) by mouth 2 (two) times daily. (Patient not taking: Reported on 09/17/2023), Disp: 60 capsule, Rfl: 11   ferrous sulfate  325 (65 FE) MG tablet, Take 1 tablet (325 mg total) by mouth every other day. (Patient not taking: Reported on 08/22/2022), Disp: 30 tablet, Rfl: 0   furosemide  (LASIX ) 20 MG tablet, Take 1 tablet (20 mg total) by mouth daily for 5 days., Disp: 5 tablet, Rfl: 0   ibuprofen  (ADVIL ) 600 MG tablet, Take 1 tablet (600 mg total) by mouth every 8 (eight) hours as needed., Disp: 30 tablet, Rfl: 0   NIFEdipine  (ADALAT  CC) 60 MG 24 hr tablet, Take 1 tablet (60 mg total) by mouth daily. (Patient not taking: Reported on 08/22/2022), Disp: 60 tablet, Rfl: 0   polyethylene glycol (MIRALAX ) 17 g packet, Take 17 g by mouth daily. (Patient not taking: Reported on 09/17/2023), Disp: 60 each, Rfl: 11   Prenat-FeCbn-FeAsp-Meth-FA-DHA  (PRENATE MINI ) 18-0.6-0.4-350 MG CAPS, Take 1 capsule by mouth daily before breakfast., Disp: 90 capsule, Rfl: 4   Prenatal MV & Min w/FA-DHA (PRENATAL GUMMIES PO), Take 1 tablet by mouth daily., Disp: , Rfl:    Medications ordered in this encounter:  No orders of the defined types were placed in this encounter.    *If you need refills on other medications prior to your next appointment, please contact your pharmacy*  Follow-Up: Call back or seek an in-person evaluation if the symptoms worsen or if the condition fails to improve as anticipated.  [facility] [phone]  Other Instructions  May stay home from work today/ tomorrow if needed. Keep follow up appointment as scheduled in the next 2 weeks with OB-GYN for further work up of cause for your heavy bleeding. 600 mg of ibuprofen  can be taken 3 times daily as needed for heavy cramping.   If you have been instructed to have an in-person evaluation today at a local Urgent Care facility, please use the link below. It will take you to a list of all of our available [facility], including address, phone number and hours of operation. Please do not delay care.  [facility]  If you or a family member do not have a primary care provider, use the link below to schedule a visit and establish care. When you choose a [facility] primary care physician or advanced practice provider, you gain a long-term partner in  health. Find a Primary Care Provider  Learn more about [facility]'s in-office and virtual care options: [facility] - Get Care Now

## 2024-01-07 ENCOUNTER — Ambulatory Visit (INDEPENDENT_AMBULATORY_CARE_PROVIDER_SITE_OTHER): Admitting: Family Medicine

## 2024-01-07 ENCOUNTER — Other Ambulatory Visit (HOSPITAL_COMMUNITY)
Admission: RE | Admit: 2024-01-07 | Discharge: 2024-01-07 | Disposition: A | Discharge status: Home / Self Care | Source: Ambulatory Visit | Attending: Family Medicine | Admitting: Family Medicine

## 2024-01-07 ENCOUNTER — Encounter: Payer: Self-pay | Admitting: Family Medicine

## 2024-01-07 VITALS — BP 124/87 | HR 71 | Ht 62.0 in | Wt 141.8 lb

## 2024-01-07 DIAGNOSIS — Z91414 Personal history of adult intimate partner abuse: Secondary | ICD-10-CM | POA: Insufficient documentation

## 2024-01-07 DIAGNOSIS — N939 Abnormal uterine and vaginal bleeding, unspecified: Secondary | ICD-10-CM | POA: Diagnosis not present

## 2024-01-07 DIAGNOSIS — N898 Other specified noninflammatory disorders of vagina: Secondary | ICD-10-CM | POA: Diagnosis not present

## 2024-01-07 DIAGNOSIS — N946 Dysmenorrhea, unspecified: Secondary | ICD-10-CM | POA: Diagnosis not present

## 2024-01-07 NOTE — Assessment & Plan Note (Addendum)
 Patient reports three months of heavy menstrual bleeding. She has a personal history of C-section and endometriosis, and her mother had severe fibroids requiring hysterectomy. The differential for heavy/abnormal uterine bleeding is broad, including structural causes (endometrial polyps, adenomyosis, fibroids, malignancy) and non-structural causes coagulopathy, ovarian dysfunction, thyroid). Plan:  - CBC to assess for anemia, leukocytosis 2/2 infection, platelets - TSH to assess for hypo/hyperthyroidism - Schedule TVUS to assess for fibroids and other structural causes - Consider TXA during periods - Schedule follow-up in 2 weeks to discuss results and next steps in management (ex. OCPs, progesterone, IUD, depo, endometrial ablation, hysterectomy) Consider treatment for endometritis, though is breast feeding and would not be a candidate for doxy

## 2024-01-07 NOTE — Progress Notes (Signed)
 Subjective:    Patient ID: Kelly Lane is a 28 y.o. female presenting with three weeks of heavy menstrual bleeding and menstrual pain/cramps.  on 01/07/2024  HPI: Patient reports three months of heavy menstrual bleeding. She has 5-7 days of flow with the first three days requiring hourly pad/tampon changes, longer and heavier than before. She also reports 8/10 stabbing/cramping pain like labor during her periods partially relieved with hospital-grade ibuprofen  from her PCP.  Of note, patient has a diagnosis of endometriosis from 2017 and her mother had fibroids s/p hysterectomy.  Review of Systems  Constitutional:  Positive for fatigue and unexpected weight change.  Respiratory:  Positive for shortness of breath.   Cardiovascular:  Negative for palpitations.  Gastrointestinal:  Positive for constipation. Negative for diarrhea.  Endocrine: Positive for cold intolerance.  Genitourinary:  Positive for menstrual problem, pelvic pain and vaginal discharge.      Objective:    BP 124/87   Pulse 71   Ht 5' 2 (1.575 m)   Wt 64.3 kg   LMP 12/22/2023   Breastfeeding Yes   BMI 25.94 kg/m  Physical Exam Constitutional:      General: She is not in acute distress.    Appearance: Normal appearance. She is not ill-appearing.  Pulmonary:     Effort: Pulmonary effort is normal. No respiratory distress.  Abdominal:     Palpations: Abdomen is soft.  Skin:    General: Skin is warm and dry.  Neurological:     Mental Status: She is alert. Mental status is at baseline.  Psychiatric:        Attention and Perception: Attention normal.        Mood and Affect: Mood normal.        Speech: Speech normal.        Behavior: Behavior is cooperative.        Thought Content: Thought content normal.       Assessment & Plan:   Problem List Items Addressed This Visit       Genitourinary   Abnormal uterine bleeding - Primary   Patient reports three months of heavy menstrual bleeding. She has  a personal history of C-section and endometriosis, and her mother had severe fibroids requiring hysterectomy. The differential for heavy/abnormal uterine bleeding is broad, including structural causes (endometrial polyps, adenomyosis, fibroids, malignancy) and non-structural causes coagulopathy, ovarian dysfunction, thyroid). Plan:  - CBC to assess for anemia, leukocytosis 2/2 infection, platelets - TSH to assess for hypo/hyperthyroidism - Schedule TVUS to assess for fibroids and other structural causes - Consider TXA during periods - Schedule follow-up in 2 weeks to discuss results and next steps in management (ex. OCPs, progesterone, IUD, depo, endometrial ablation, hysterectomy) Consider treatment for endometritis, though is breast feeding and would not be a candidate for doxy      Relevant Orders   US  PELVIC COMPLETE WITH TRANSVAGINAL   CBC   TSH   Dysmenorrhea   Patient reports worsening severe menstrual pain and cramps over the past three months with some relief from ibuprofen . She has a biopsy-confirmed diagnosis of endometriosis, which would explain period-associated pain and cramping. Other possible causes include fibroids vs. primary dysmenorrhea.  Plan:  - Work-up as above -Consider treatment with IUD or POP's or Aygestin  - Continue ibuprofen  as prescribed by PCP        Other   Vaginal discharge   Patient reports vaginal discharge, white and curdled in appearance with vulvar itching, concerning for candida. She reports  no sexual intercourse since 2023, so low suspicion for STI at this time.  Check wet prep and treat appropriately.      Relevant Orders   Cervicovaginal ancillary only( [facility])     Return in about 4 weeks (around 02/04/2024) for a follow-up, needs U/S.  Jayson LELON Ness, Medical Student 01/07/2024 4:50 PM

## 2024-01-07 NOTE — Progress Notes (Signed)
 Pt is in the office for GYN visit reporting heavy menstrual cycles. Reports cycles became heavier within the last 3 months, cramps 8/10, and changing pads/tampons every 1-2 hrs for the first 3 days, with cycles lasting 5-7 days. Currently breastfeeding and not on BC Declines BC today LMP 12/22/23 Last pap 09/17/23 Pt reports vaginal discharge, odor, and itching

## 2024-01-07 NOTE — Assessment & Plan Note (Addendum)
 Patient reports worsening severe menstrual pain and cramps over the past three months with some relief from ibuprofen . She has a biopsy-confirmed diagnosis of endometriosis, which would explain period-associated pain and cramping. Other possible causes include fibroids vs. primary dysmenorrhea.  Plan:  - Work-up as above -Consider treatment with IUD or POP's or Aygestin  - Continue ibuprofen  as prescribed by PCP

## 2024-01-07 NOTE — Assessment & Plan Note (Addendum)
 Patient reports vaginal discharge, white and curdled in appearance with vulvar itching, concerning for candida. She reports no sexual intercourse since 2023, so low suspicion for STI at this time.  Check wet prep and treat appropriately.

## 2024-01-07 NOTE — Progress Notes (Signed)
 See medical student note

## 2024-01-08 LAB — CERVICOVAGINAL ANCILLARY ONLY
Bacterial Vaginitis (gardnerella): NEGATIVE
Candida Glabrata: NEGATIVE
Candida Vaginitis: POSITIVE — AB
Chlamydia: NEGATIVE
Comment: NEGATIVE
Comment: NEGATIVE
Comment: NEGATIVE
Comment: NEGATIVE
Comment: NEGATIVE
Comment: NORMAL
Neisseria Gonorrhea: NEGATIVE
Trichomonas: NEGATIVE

## 2024-01-08 LAB — CBC
Hematocrit: 42.2 % (ref 34.0–46.6)
Hemoglobin: 14.4 g/dL (ref 11.1–15.9)
MCH: 33.5 pg — ABNORMAL HIGH (ref 26.6–33.0)
MCHC: 34.1 g/dL (ref 31.5–35.7)
MCV: 98 fL — ABNORMAL HIGH (ref 79–97)
Platelets: 297 x10E3/uL (ref 150–450)
RBC: 4.3 x10E6/uL (ref 3.77–5.28)
RDW: 12 % (ref 11.7–15.4)
WBC: 5.3 x10E3/uL (ref 3.4–10.8)

## 2024-01-08 LAB — TSH: TSH: 0.912 u[IU]/mL (ref 0.450–4.500)

## 2024-01-09 ENCOUNTER — Ambulatory Visit: Payer: Self-pay | Admitting: Family Medicine

## 2024-01-09 MED ORDER — FLUCONAZOLE 150 MG PO TABS: 150.0000 mg | ORAL_TABLET | Freq: Every day | ORAL | 2 refills | Status: AC

## 2024-01-13 DIAGNOSIS — S99911A Unspecified injury of right ankle, initial encounter: Secondary | ICD-10-CM | POA: Diagnosis not present

## 2024-01-13 DIAGNOSIS — M25571 Pain in right ankle and joints of right foot: Secondary | ICD-10-CM | POA: Diagnosis not present

## 2024-01-14 ENCOUNTER — Ambulatory Visit (HOSPITAL_COMMUNITY)
Admission: RE | Admit: 2024-01-14 | Discharge: 2024-01-14 | Disposition: A | Discharge status: Home / Self Care | Source: Ambulatory Visit | Attending: Family Medicine | Admitting: Family Medicine

## 2024-01-14 DIAGNOSIS — N939 Abnormal uterine and vaginal bleeding, unspecified: Secondary | ICD-10-CM | POA: Insufficient documentation

## 2024-01-14 DIAGNOSIS — D251 Intramural leiomyoma of uterus: Secondary | ICD-10-CM | POA: Diagnosis not present

## 2024-01-14 DIAGNOSIS — N854 Malposition of uterus: Secondary | ICD-10-CM | POA: Diagnosis not present

## 2024-01-18 ENCOUNTER — Telehealth: Payer: Self-pay | Admitting: Family Medicine

## 2024-01-18 NOTE — Telephone Encounter (Signed)
 Called patient about upcoming appointment. Left VM with office call back number if patient needs to reschedule.

## 2024-01-28 ENCOUNTER — Emergency Department (HOSPITAL_BASED_OUTPATIENT_CLINIC_OR_DEPARTMENT_OTHER)
Admission: EM | Admit: 2024-01-28 | Discharge: 2024-01-28 | Disposition: A | Discharge status: Home / Self Care | Attending: Emergency Medicine | Admitting: Emergency Medicine

## 2024-01-28 ENCOUNTER — Other Ambulatory Visit: Payer: Self-pay

## 2024-01-28 DIAGNOSIS — J069 Acute upper respiratory infection, unspecified: Secondary | ICD-10-CM | POA: Diagnosis not present

## 2024-01-28 DIAGNOSIS — J029 Acute pharyngitis, unspecified: Secondary | ICD-10-CM | POA: Diagnosis present

## 2024-01-28 DIAGNOSIS — B9789 Other viral agents as the cause of diseases classified elsewhere: Secondary | ICD-10-CM | POA: Diagnosis not present

## 2024-01-28 DIAGNOSIS — R0981 Nasal congestion: Secondary | ICD-10-CM

## 2024-01-28 LAB — RESP PANEL BY RT-PCR (RSV, FLU A&B, COVID)  RVPGX2
Influenza A by PCR: NEGATIVE
Influenza B by PCR: NEGATIVE
Resp Syncytial Virus by PCR: NEGATIVE
SARS Coronavirus 2 by RT PCR: NEGATIVE

## 2024-01-28 LAB — GROUP A STREP BY PCR: Group A Strep by PCR: NOT DETECTED

## 2024-01-28 MED ORDER — CETIRIZINE-PSEUDOEPHEDRINE ER 5-120 MG PO TB12: 1.0000 | ORAL_TABLET | Freq: Every day | ORAL | 0 refills | Status: AC | PRN

## 2024-01-28 MED ORDER — CELECOXIB 200 MG PO CAPS: 200.0000 mg | ORAL_CAPSULE | Freq: Two times a day (BID) | ORAL | 0 refills | Status: AC | PRN

## 2024-01-28 MED ORDER — TRIAMCINOLONE ACETONIDE 55 MCG/ACT NA AERO: 2.0000 | INHALATION_SPRAY | Freq: Every day | NASAL | 0 refills | Status: AC

## 2024-01-28 NOTE — ED Notes (Signed)
 ED Provider at bedside.

## 2024-01-28 NOTE — ED Triage Notes (Signed)
 Pt POV reporting congestion and sore throat, denies fever or SOB, requesting work note.

## 2024-01-28 NOTE — ED Provider Notes (Signed)
 [place] EMERGENCY DEPARTMENT AT [facility] Provider Note   CSN: [id number] Arrival date & time: 01/28/24  1757     Patient presents with: URI   Kelly Lane is a 28 y.o. female.    URI   28 year old female presents emergency department complaints of sore throat, nasal congestion.  Symptoms present for the past couple of days.  Reports painful swallowing but still able to swallow.  Denies any cough, fevers, chills, ear pain.  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  Presents emergency department for further assessment.  Past medical history significant for GERD, ovarian cyst, endometriosis, anxiety  Prior to Admission medications   Medication Sig Start Date End Date Taking? Authorizing Provider  celecoxib  (CELEBREX ) 200 MG capsule Take 1 capsule (200 mg total) by mouth 2 (two) times daily as needed. 01/28/24  Yes Silver Fell A, PA  cetirizine -pseudoephedrine  (ZYRTEC -D) 5-120 MG tablet Take 1 tablet by mouth daily as needed. 01/28/24  Yes Silver Fell A, PA  triamcinolone  (NASACORT ) 55 MCG/ACT AERO nasal inhaler Place 2 sprays into the nose daily. 01/28/24  Yes Silver Fell A, PA  clotrimazole  (LOTRIMIN ) 1 % cream Apply 1 Application topically 2 (two) times daily. Patient not taking: Reported on 09/17/2023 08/26/23   Gladis Elsie BROCKS, PA-C  docusate sodium  (COLACE) 100 MG capsule Take 1 capsule (100 mg total) by mouth 2 (two) times daily. Patient not taking: Reported on 09/17/2023 09/24/22   Rudy Carlin LABOR, MD  docusate sodium  (COLACE) 100 MG capsule Take 1 capsule (100 mg total) by mouth 2 (two) times daily. Patient not taking: Reported on 09/17/2023 10/22/22   Rudy Carlin LABOR, MD  ferrous sulfate  325 (65 FE) MG tablet Take 1 tablet (325 mg total) by mouth every other day. Patient not taking: Reported on 08/22/2022 08/15/22   Ndulue, Chiagoziem J, MD  fluconazole  (DIFLUCAN ) 150 MG tablet Take 1 tablet (150 mg total) by mouth daily. Repeat in 24 hours if  needed 01/09/24   Fredirick Glenys RAMAN, MD  furosemide  (LASIX ) 20 MG tablet Take 1 tablet (20 mg total) by mouth daily for 5 days. 08/15/22 08/20/22  Ndulue, Chiagoziem J, MD  ibuprofen  (ADVIL ) 600 MG tablet Take 1 tablet (600 mg total) by mouth every 8 (eight) hours as needed. 08/14/22   Ndulue, Chiagoziem J, MD  NIFEdipine  (ADALAT  CC) 60 MG 24 hr tablet Take 1 tablet (60 mg total) by mouth daily. Patient not taking: Reported on 08/22/2022 08/15/22   Ndulue, Chiagoziem J, MD  polyethylene glycol (MIRALAX ) 17 g packet Take 17 g by mouth daily. Patient not taking: Reported on 09/17/2023 09/24/22   Rudy Carlin LABOR, MD  Prenat-FeCbn-FeAsp-Meth-FA-DHA (PRENATE MINI ) 18-0.6-0.4-350 MG CAPS Take 1 capsule by mouth daily before breakfast. 09/17/23   Rudy Carlin LABOR, MD  Prenatal MV & Min w/FA-DHA (PRENATAL GUMMIES PO) Take 1 tablet by mouth daily.    [provider]    Allergies: Compazine  [prochlorperazine ]    Review of Systems  All other systems reviewed and are negative.   Updated Vital Signs BP (!) 128/97 (BP Location: Right Arm)   Pulse (!) 105   Temp 98.1 F (36.7 C)   Resp 15   Ht 5' 2 (1.575 m)   Wt 63 kg   LMP 12/22/2023   SpO2 98%   BMI 25.42 kg/m   Physical Exam Vitals and nursing note reviewed.  Constitutional:      General: She is not in acute distress.    Appearance: She is  well-developed.  HENT:     Head: Normocephalic and atraumatic.     Nose: Congestion and rhinorrhea present.     Mouth/Throat:     Comments: Mild posterior pharyngeal erythema.  Uvula midline and rises symmetrically with phonation.  No sublingual or submandibular swelling.  No trismus.  Tonsils 1+ bilaterally without exudate. Eyes:     Conjunctiva/sclera: Conjunctivae normal.  Cardiovascular:     Rate and Rhythm: Normal rate and regular rhythm.     Heart sounds: No murmur heard. Pulmonary:     Effort: Pulmonary effort is normal. No respiratory distress.     Breath sounds: Normal breath sounds.   Abdominal:     Palpations: Abdomen is soft.     Tenderness: There is no abdominal tenderness.  Musculoskeletal:        General: No swelling.     Cervical back: Neck supple.  Skin:    General: Skin is warm and dry.     Capillary Refill: Capillary refill takes less than 2 seconds.  Neurological:     Mental Status: She is alert.  Psychiatric:        Mood and Affect: Mood normal.     (all labs ordered are listed, but only abnormal results are displayed) Labs Reviewed  RESP PANEL BY RT-PCR (RSV, FLU A&B, COVID)  [id number]  GROUP A STREP BY PCR    EKG: None  Radiology: No results found.   Procedures   Medications Ordered in the ED - No data to display                                  Medical Decision Making Risk OTC drugs. Prescription drug management.   This patient presents to the ED for concern of sore throat, nasal congestion, this involves an extensive number of treatment options, and is a complaint that carries with it a high risk of complications and morbidity.  The differential diagnosis includes viral URI, COVID, flu, RSV, strep pharyngitis, PTA, retropharyngeal abscess, other   Co morbidities that complicate the patient evaluation  See HPI   Additional history obtained:  Additional history obtained from EMR External records from outside source obtained and reviewed including hospital records   Lab Tests:  I Ordered, and personally interpreted labs.  The pertinent results include: Viral testing negative.  Group A strep negative.   Imaging Studies ordered:  N/a   Cardiac Monitoring: / EKG:  N/a   Consultations Obtained:  N/a   Problem List / ED Course / Critical interventions / Medication management  Sore throat, nasal congestion Reevaluation of the patient showed that the patient stayed the same I have reviewed the patients home medicines and have made adjustments as needed   Social Determinants of Health:  Denies tobacco, licit  drug use.   Test / Admission - Considered:  Sore throat, nasal congestion Vitals signs within normal range and stable throughout visit. Laboratory studies significant for: See above 28 year old female presents emergency department complaints of sore throat, nasal congestion.  Symptoms present for the past couple of days.  Reports painful swallowing but still able to swallow.  Denies any cough, fevers, chills, ear pain.  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  Presents emergency department for further assessment. On exam, mild posterior pharyngeal erythema.  Nasal congestion present.  Lungs clear rotation bilaterally.  No abdominal tenderness.  Workup today reassuring.  Viral testing negative.  Group A strep  negative.  No evidence of PTA, retropharyngeal abscess, Ludwig angina.  Suspect that symptoms most likely secondary to viral URI.  Recommend symptomatic therapy as in AVS and follow-up with primary care for reassessment.  Treatment plan discussed with patient she is understanding was agreeable.  Patient well-appearing, afebrile in no acute distress.  Worrisome signs and symptoms were discussed with the patient, and the patient acknowledged understanding to return to the ED if noticed. Patient was stable upon discharge.       Final diagnoses:  None          Silver Wonda LABOR, [place] 01/28/24 2043    Mannie Pac T, DO 01/28/24 2256

## 2024-01-28 NOTE — Discharge Instructions (Signed)
 Your workup today was reassuring.  He tested negative for COVID, flu, RSV, strep throat.  Suspect viral illness.  Will send him with a few different medicines to treat your symptoms.  Recommend follow-up with your primary care for reassessment.

## 2024-02-01 ENCOUNTER — Telehealth: Payer: Self-pay | Admitting: Family Medicine

## 2024-02-01 ENCOUNTER — Other Ambulatory Visit: Payer: Self-pay

## 2024-02-01 ENCOUNTER — Encounter: Payer: Self-pay | Admitting: Family Medicine

## 2024-02-01 ENCOUNTER — Ambulatory Visit: Admitting: Family Medicine

## 2024-02-01 VITALS — BP 130/93 | HR 69 | Wt 140.0 lb

## 2024-02-01 DIAGNOSIS — N809 Endometriosis, unspecified: Secondary | ICD-10-CM | POA: Diagnosis not present

## 2024-02-01 DIAGNOSIS — N939 Abnormal uterine and vaginal bleeding, unspecified: Secondary | ICD-10-CM

## 2024-02-01 MED ORDER — SLYND 4 MG PO TABS: 1.0000 | ORAL_TABLET | Freq: Every day | ORAL | 1 refills | Status: AC

## 2024-02-01 NOTE — Progress Notes (Deleted)
 Patient seen in conjunction with the medical student.  I have taken the history and preformed the exam.  The above note has been edited as necessary.  Glenys GORMAN Birk, MD  02/01/2024 12:59 PM

## 2024-02-01 NOTE — Assessment & Plan Note (Signed)
 History of worsening heavy and painful periods. Has small fibroid. Discussed OCPs, IUD, endometrial ablation, fibroid ablation, fibroid embolization, and hyst. Risks of each reviewed including permanent sterility, risks of surgery. We reviewed her prior pathology and surgical notes from laparoscopy as well as likelihood of recurrence of endometriosis and ovarian removal and it's impact on life expectancy.  Patient interested in beginning with oral meds but reports mood changes on Depo and Nexplanon. Still nursing and so opted for POPs.

## 2024-02-01 NOTE — Progress Notes (Signed)
   Subjective:    Patient ID: Kelly Lane is a 28 y.o. female presenting with Ultrasound Follow up on 02/01/2024  HPI: Patient reports heavier-than-prior periods with contraction-like pain most recently 01/19/26. Patient with biopsy proven endometriosis, also with C-section x 1 and VBAC x 1. Seen for same, with normal TSH and u/s shows small 2 cm fibroid, present with previous pregnancy and about the same size.  Review of Systems  Genitourinary:  Positive for menstrual problem and pelvic pain.  All other systems reviewed and are negative.     Objective:    BP (!) 130/93   Pulse 69   Wt 63.5 kg   LMP 01/17/2024   BMI 25.61 kg/m  Physical Exam Constitutional:      General: She is not in acute distress.    Appearance: Normal appearance. She is not ill-appearing.  Pulmonary:     Effort: Pulmonary effort is normal. No respiratory distress.  Abdominal:     Palpations: Abdomen is soft.  Skin:    General: Skin is warm and dry.  Neurological:     Mental Status: She is alert. Mental status is at baseline.  Psychiatric:        Mood and Affect: Mood normal.       Assessment & Plan:    Problem List Items Addressed This Visit       Unprioritized   Endometriosis   Discussed possible repeat excision of endo if present, and possible recurrence and why treatment is often oral. We will start with Slynd and may move to loestrin or elagolix or the like.      Relevant Medications   Drospirenone (SLYND) 4 MG TABS   Abnormal uterine bleeding - Primary   History of worsening heavy and painful periods. Has small fibroid. Discussed OCPs, IUD, endometrial ablation, fibroid ablation, fibroid embolization, and hyst. Risks of each reviewed including permanent sterility, risks of surgery. We reviewed her prior pathology and surgical notes from laparoscopy as well as likelihood of recurrence of endometriosis and ovarian removal and it's impact on life expectancy.  Patient interested in beginning  with oral meds but reports mood changes on Depo and Nexplanon. Still nursing and so opted for POPs.      Relevant Medications   Drospirenone (SLYND) 4 MG TABS      Return in about 6 weeks (around 03/14/2024).  Jayson LELON Ness, Medical Student 02/01/2024 11:15 AM

## 2024-02-01 NOTE — Telephone Encounter (Signed)
 Call patient to confirm appt for today. There was no answer left VM.

## 2024-02-01 NOTE — Assessment & Plan Note (Signed)
 Discussed possible repeat excision of endo if present, and possible recurrence and why treatment is often oral. We will start with Slynd and may move to loestrin or elagolix or the like.

## 2024-03-09 DIAGNOSIS — R197 Diarrhea, unspecified: Secondary | ICD-10-CM | POA: Diagnosis not present

## 2024-03-09 DIAGNOSIS — R300 Dysuria: Secondary | ICD-10-CM | POA: Diagnosis not present

## 2024-03-09 DIAGNOSIS — B349 Viral infection, unspecified: Secondary | ICD-10-CM | POA: Diagnosis not present
